# Patient Record
Sex: Male | Born: 1935
Health system: Southern US, Community
[De-identification: ages and names within clinical notes are randomized; demographics above are authoritative.]

## PROBLEM LIST (undated history)

## (undated) DIAGNOSIS — E785 Hyperlipidemia, unspecified: Secondary | ICD-10-CM

## (undated) DIAGNOSIS — E663 Overweight: Secondary | ICD-10-CM

## (undated) DIAGNOSIS — K760 Fatty (change of) liver, not elsewhere classified: Secondary | ICD-10-CM

## (undated) DIAGNOSIS — K3189 Other diseases of stomach and duodenum: Secondary | ICD-10-CM

## (undated) DIAGNOSIS — R339 Retention of urine, unspecified: Secondary | ICD-10-CM

## (undated) DIAGNOSIS — I519 Heart disease, unspecified: Secondary | ICD-10-CM

## (undated) DIAGNOSIS — J9601 Acute respiratory failure with hypoxia: Secondary | ICD-10-CM

## (undated) DIAGNOSIS — R972 Elevated prostate specific antigen [PSA]: Secondary | ICD-10-CM

## (undated) DIAGNOSIS — N289 Disorder of kidney and ureter, unspecified: Secondary | ICD-10-CM

## (undated) DIAGNOSIS — I10 Essential (primary) hypertension: Secondary | ICD-10-CM

## (undated) DIAGNOSIS — F0280 Dementia in other diseases classified elsewhere without behavioral disturbance: Secondary | ICD-10-CM

## (undated) DIAGNOSIS — N400 Enlarged prostate without lower urinary tract symptoms: Secondary | ICD-10-CM

## (undated) DIAGNOSIS — R310 Gross hematuria: Secondary | ICD-10-CM

## (undated) DIAGNOSIS — I739 Peripheral vascular disease, unspecified: Secondary | ICD-10-CM

## (undated) DIAGNOSIS — D126 Benign neoplasm of colon, unspecified: Secondary | ICD-10-CM

## (undated) DIAGNOSIS — E669 Obesity, unspecified: Secondary | ICD-10-CM

## (undated) HISTORY — DX: Hyperlipidemia, unspecified: E78.5

## (undated) HISTORY — DX: Heart disease, unspecified: I51.9

## (undated) HISTORY — PX: OTHER SURGICAL HISTORY: SHX169

## (undated) HISTORY — DX: Dementia in other diseases classified elsewhere, unspecified severity, without behavioral disturbance, psychotic disturbance, mood disturbance, and anxiety: F02.80

## (undated) HISTORY — DX: Retention of urine, unspecified: R33.9

## (undated) HISTORY — DX: Obesity, unspecified: E66.9

## (undated) HISTORY — DX: Peripheral vascular disease, unspecified: I73.9

## (undated) HISTORY — DX: Other diseases of stomach and duodenum: K31.89

## (undated) HISTORY — DX: Benign prostatic hyperplasia without lower urinary tract symptoms: N40.0

## (undated) HISTORY — DX: Gross hematuria: R31.0

## (undated) HISTORY — DX: Essential (primary) hypertension: I10

## (undated) HISTORY — DX: Elevated prostate specific antigen (PSA): R97.20

## (undated) HISTORY — DX: Overweight: E66.3

## (undated) HISTORY — DX: Fatty (change of) liver, not elsewhere classified: K76.0

## (undated) HISTORY — DX: Acute respiratory failure with hypoxia: J96.01

## (undated) HISTORY — PX: APPENDECTOMY: SHX54

## (undated) HISTORY — DX: Benign neoplasm of colon, unspecified: D12.6

---

## 1988-06-20 HISTORY — PX: OTHER SURGICAL HISTORY: SHX169

## 2006-10-15 ENCOUNTER — Emergency Department: Payer: Self-pay | Admitting: Internal Medicine

## 2006-10-24 ENCOUNTER — Emergency Department: Payer: Self-pay | Admitting: Internal Medicine

## 2007-01-18 ENCOUNTER — Encounter: Payer: Self-pay | Admitting: Internal Medicine

## 2007-07-26 ENCOUNTER — Ambulatory Visit: Payer: Self-pay | Admitting: Gastroenterology

## 2010-01-22 ENCOUNTER — Ambulatory Visit: Payer: Self-pay | Admitting: Internal Medicine

## 2010-03-05 ENCOUNTER — Ambulatory Visit: Payer: Self-pay | Admitting: Ophthalmology

## 2010-03-15 ENCOUNTER — Ambulatory Visit: Payer: Self-pay | Admitting: Ophthalmology

## 2010-03-18 ENCOUNTER — Ambulatory Visit: Payer: Self-pay | Admitting: Cardiology

## 2010-08-04 ENCOUNTER — Ambulatory Visit: Payer: Self-pay | Admitting: Ophthalmology

## 2010-08-17 ENCOUNTER — Ambulatory Visit: Payer: Self-pay | Admitting: Ophthalmology

## 2010-09-13 LAB — HM COLONOSCOPY

## 2011-01-31 ENCOUNTER — Encounter: Payer: Self-pay | Admitting: Internal Medicine

## 2011-02-28 ENCOUNTER — Other Ambulatory Visit: Payer: Self-pay | Admitting: Internal Medicine

## 2011-03-01 MED ORDER — NEBIVOLOL HCL 5 MG PO TABS: 5.0000 mg | ORAL_TABLET | Freq: Every day | ORAL | Status: DC

## 2011-05-03 ENCOUNTER — Ambulatory Visit: Payer: Self-pay | Admitting: Internal Medicine

## 2011-05-11 ENCOUNTER — Encounter: Payer: Self-pay | Admitting: Internal Medicine

## 2011-05-11 ENCOUNTER — Ambulatory Visit (INDEPENDENT_AMBULATORY_CARE_PROVIDER_SITE_OTHER): Payer: Medicare Other | Admitting: Internal Medicine

## 2011-05-11 VITALS — BP 138/62 | HR 53 | Temp 98.4°F | Resp 16 | Ht 68.0 in | Wt 174.2 lb

## 2011-05-11 DIAGNOSIS — E785 Hyperlipidemia, unspecified: Secondary | ICD-10-CM

## 2011-05-11 DIAGNOSIS — I10 Essential (primary) hypertension: Secondary | ICD-10-CM

## 2011-05-11 DIAGNOSIS — R42 Dizziness and giddiness: Secondary | ICD-10-CM

## 2011-05-11 DIAGNOSIS — Z1211 Encounter for screening for malignant neoplasm of colon: Secondary | ICD-10-CM

## 2011-05-11 DIAGNOSIS — D649 Anemia, unspecified: Secondary | ICD-10-CM

## 2011-05-11 DIAGNOSIS — E538 Deficiency of other specified B group vitamins: Secondary | ICD-10-CM

## 2011-05-11 NOTE — Patient Instructions (Signed)
You should get a TdaP (tetanus, diptheria, pertussis ) vaccine either here, at [facility] or at the [facility].  Medicare does not pay for it but supplemental  insurance does., . It is free at the [facility].

## 2011-05-12 LAB — COMPREHENSIVE METABOLIC PANEL
ALT: 15 U/L (ref 0–53)
AST: 22 U/L (ref 0–37)
Alkaline Phosphatase: 82 U/L (ref 39–117)
BUN: 20 mg/dL (ref 6–23)
Calcium: 9.3 mg/dL (ref 8.4–10.5)
Chloride: 102 mEq/L (ref 96–112)
Creat: 0.97 mg/dL (ref 0.50–1.35)
Total Bilirubin: 0.4 mg/dL (ref 0.3–1.2)

## 2011-05-12 LAB — FOLATE RBC

## 2011-05-13 ENCOUNTER — Encounter: Payer: Self-pay | Admitting: Internal Medicine

## 2011-05-13 DIAGNOSIS — N400 Enlarged prostate without lower urinary tract symptoms: Secondary | ICD-10-CM | POA: Insufficient documentation

## 2011-05-13 DIAGNOSIS — Z1211 Encounter for screening for malignant neoplasm of colon: Secondary | ICD-10-CM | POA: Insufficient documentation

## 2011-05-13 DIAGNOSIS — E785 Hyperlipidemia, unspecified: Secondary | ICD-10-CM | POA: Insufficient documentation

## 2011-05-13 DIAGNOSIS — E782 Mixed hyperlipidemia: Secondary | ICD-10-CM | POA: Insufficient documentation

## 2011-05-13 DIAGNOSIS — I10 Essential (primary) hypertension: Secondary | ICD-10-CM | POA: Insufficient documentation

## 2011-05-13 NOTE — Assessment & Plan Note (Addendum)
Last scope was March 2012 normal.  Prior scope was done in  2009, and one of 5 polyps was a tubular adenoma.

## 2011-05-13 NOTE — Assessment & Plan Note (Signed)
Reasonable control with bystolic and prn hctz.  History of cough with ACE Inhibitor.

## 2011-05-13 NOTE — Progress Notes (Signed)
Subjective:    Patient ID: Christopher Jenkins, male    DOB: 07-24-1926, 77 y.o.   MRN: [id number]  HPI  Mr. Tanzi is an 77 yr old white male with a history of hypertension, BPH who presents for 6 month follow up.  He feels generally well and is remaining physically active with golf and tennis several times weekly.  Chief complaint is daytime sleepiness requiring short naps.  No history of snoring or apneic behavior reported by wife.   Past Medical History  Diagnosis Date  . Hypertrophy (benign) of prostate     asymptomatic on finasteride. will f/up with urologist [name]  . Hyperlipidemia   . Hypertension     Current Outpatient Prescriptions on File Prior to Visit  Medication Sig Dispense Refill  . Cholecalciferol (VITAMIN D3 PO) Take 1 tablet by mouth daily.        Tery Sanfilippo Calcium (STOOL SOFTENER PO) Take 1 tablet by mouth daily as needed.        . finasteride (PROSCAR) 5 MG tablet Take 5 mg by mouth daily.        . fish oil-omega-3 fatty acids 1000 MG capsule Take 1 g by mouth daily.        . hydrochlorothiazide 25 MG tablet Take 25 mg by mouth daily.        . Multiple Vitamin (MULTIVITAMIN) tablet Take 1 tablet by mouth daily.        . nebivolol (BYSTOLIC) 5 MG tablet Take 1 tablet (5 mg total) by mouth daily.  90 tablet  3  . niacin 250 MG tablet Take 250 mg by mouth daily.         Review of Systems  Constitutional: Negative for fever, chills, diaphoresis, activity change, appetite change, fatigue and unexpected weight change.  HENT: Negative for hearing loss, ear pain, nosebleeds, congestion, sore throat, facial swelling, rhinorrhea, sneezing, drooling, mouth sores, trouble swallowing, neck pain, neck stiffness, dental problem, voice change, postnasal drip, sinus pressure, tinnitus and ear discharge.   Eyes: Negative for photophobia, pain, discharge, redness, itching and visual disturbance.  Respiratory: Negative for apnea, cough, choking, chest tightness, shortness of  breath, wheezing and stridor.   Cardiovascular: Negative for chest pain, palpitations and leg swelling.  Gastrointestinal: Negative for nausea, vomiting, abdominal pain, diarrhea, constipation, blood in stool, abdominal distention, anal bleeding and rectal pain.  Genitourinary: Negative for dysuria, urgency, frequency, hematuria, flank pain, decreased urine volume, scrotal swelling, difficulty urinating and testicular pain.  Musculoskeletal: Negative for myalgias, back pain, joint swelling, arthralgias and gait problem.  Skin: Negative for color change, rash and wound.  Neurological: Negative for dizziness, tremors, seizures, syncope, speech difficulty, weakness, light-headedness, numbness and headaches.  Psychiatric/Behavioral: Negative for suicidal ideas, hallucinations, behavioral problems, confusion, sleep disturbance, dysphoric mood, decreased concentration and agitation. The patient is not nervous/anxious.    BP 138/62  Pulse 53  Temp(Src) 98.4 F (36.9 C) (Oral)  Resp 16  Ht 5\' 8"  (1.727 m)  Wt 174 lb 4 oz (79.039 kg)  BMI 26.49 kg/m2  SpO2 98%     Objective:   Physical Exam  Constitutional: He is oriented to person, place, and time.  HENT:  Head: Normocephalic and atraumatic.  Mouth/Throat: Oropharynx is clear and moist.  Eyes: Conjunctivae and EOM are normal.  Neck: Normal range of motion. Neck supple. No JVD present. No thyromegaly present.  Cardiovascular: Normal rate, regular rhythm and normal heart sounds.   Pulmonary/Chest: Effort normal and breath sounds normal. He has  no wheezes. He has no rales.  Abdominal: Soft. Bowel sounds are normal. He exhibits no mass. There is no tenderness. There is no rebound.  Musculoskeletal: Normal range of motion. He exhibits no edema.  Neurological: He is alert and oriented to person, place, and time.  Skin: Skin is warm and dry.  Psychiatric: He has a normal mood and affect.       Assessment & Plan:

## 2011-05-13 NOTE — Assessment & Plan Note (Signed)
Managed with niacin and fish oil, due to myalgias from statin therapy. He has had a normal stress test since age 77 at my bidding.

## 2011-05-30 ENCOUNTER — Telehealth: Payer: Self-pay | Admitting: Internal Medicine

## 2011-05-30 NOTE — Telephone Encounter (Signed)
I do not prescribe diet pills over the phone because the side effects need t be discussed.  All are stimulants and can raise bp

## 2011-05-30 NOTE — Telephone Encounter (Signed)
Diet pill that he could take for weight loss either over the counter or prescription.

## 2011-07-08 DIAGNOSIS — M21619 Bunion of unspecified foot: Secondary | ICD-10-CM | POA: Diagnosis not present

## 2011-07-08 DIAGNOSIS — M779 Enthesopathy, unspecified: Secondary | ICD-10-CM | POA: Diagnosis not present

## 2011-07-08 DIAGNOSIS — L84 Corns and callosities: Secondary | ICD-10-CM | POA: Diagnosis not present

## 2011-07-19 ENCOUNTER — Other Ambulatory Visit: Payer: Self-pay | Admitting: *Deleted

## 2011-07-19 MED ORDER — HYDROCHLOROTHIAZIDE 25 MG PO TABS: 25.0000 mg | ORAL_TABLET | Freq: Every day | ORAL | Status: DC

## 2011-09-26 ENCOUNTER — Encounter: Payer: Self-pay | Admitting: Internal Medicine

## 2011-10-21 DIAGNOSIS — M21619 Bunion of unspecified foot: Secondary | ICD-10-CM | POA: Diagnosis not present

## 2011-11-08 ENCOUNTER — Encounter: Payer: 59 | Admitting: Internal Medicine

## 2011-12-14 ENCOUNTER — Ambulatory Visit (INDEPENDENT_AMBULATORY_CARE_PROVIDER_SITE_OTHER): Payer: Medicare Other | Admitting: Internal Medicine

## 2011-12-14 VITALS — BP 130/64 | HR 56 | Temp 98.0°F | Resp 16 | Ht 68.0 in | Wt 214.0 lb

## 2011-12-14 DIAGNOSIS — N400 Enlarged prostate without lower urinary tract symptoms: Secondary | ICD-10-CM | POA: Diagnosis not present

## 2011-12-14 DIAGNOSIS — Z0000 Encounter for general adult medical examination without abnormal findings: Secondary | ICD-10-CM | POA: Diagnosis not present

## 2011-12-14 DIAGNOSIS — R5383 Other fatigue: Secondary | ICD-10-CM

## 2011-12-14 DIAGNOSIS — I10 Essential (primary) hypertension: Secondary | ICD-10-CM

## 2011-12-14 DIAGNOSIS — E669 Obesity, unspecified: Secondary | ICD-10-CM | POA: Diagnosis not present

## 2011-12-14 DIAGNOSIS — E785 Hyperlipidemia, unspecified: Secondary | ICD-10-CM

## 2011-12-14 MED ORDER — DOCUSATE SODIUM 50 MG/5ML PO LIQD: ORAL | Status: DC

## 2011-12-14 NOTE — Progress Notes (Signed)
Patient ID: Christopher Jenkins, male   DOB: 04/04/1927, 78 y.o.   MRN: [id number] The patient is here for annual Medicare wellness examination and management of other chronic and acute problems. Went ski diving recently (tandem) and had an incredible time.  Concerned about recent legislative events going on in [place] and has been going every Monday to demonstrate..  Was arrested for civil disobedience/ tresspassing and spent a few hours in the slammer. NAACP lawyers handling everything including appearing in court in [place].     The risk factors are reflected in the social history.  The roster of all physicians providing medical care to patient - is listed in the Snapshot section of the chart.  Activities of daily living:  The patient is 100% independent in all ADLs: dressing, toileting, feeding as well as independent mobility  Home safety : The patient has smoke detectors in the home. They wear seatbelts.  There are no firearms at home. There is no violence in the home.   There is no risks for hepatitis, STDs or HIV. There is no   history of blood transfusion. They have no travel history to infectious disease endemic areas of the world.  The patient has seen their dentist in the last six month. They have seen their eye doctor in the last year. They admit to slight hearing difficulty with regard to whispered voices and some television programs.  They have deferred audiologic testing in the last year.  They do not  have excessive sun exposure. Discussed the need for sun protection: hats, long sleeves and use of sunscreen if there is significant sun exposure.   Diet: the importance of a healthy diet is discussed. They do have a healthy diet.  The benefits of regular aerobic exercise were discussed. She walks 4 times per week ,  20 minutes.   Depression screen: there are no signs or vegative symptoms of depression- irritability, change in appetite, anhedonia, sadness/tearfullness.  Cognitive  assessment: the patient manages all their financial and personal affairs and is actively engaged. They could relate day,date,year and events; recalled 2/3 objects at 3 minutes; performed clock-face test normally.  The following portions of the patient's history were reviewed and updated as appropriate: allergies, current medications, past family history, past medical history,  past surgical history, past social history  and problem list.  Visual acuity was not assessed per patient preference since she has regular follow up with her ophthalmologist. Hearing and body mass index were assessed and reviewed.   During the course of the visit the patient was educated and counseled about appropriate screening and preventive services including : fall prevention , diabetes screening, nutrition counseling, colorectal cancer screening, and recommended immunizations.     Obesity (BMI 30-39.9) I have addressed  BMI and recommended a low glycemic index diet utilizing smaller more frequent meals to increase metabolism.  I have also recommended that patient start exercising with a goal of 30 minutes of aerobic exercise a minimum of 5 days per week. Screening for lipid disorders, thyroid and diabetes has been done annually.    Hypertension Well controlled on current regimen. Renal function stable, no changes today.  Hypertrophy (benign) of prostate He is asymptomatic on finasteride.    Updated Medication List Outpatient Encounter Prescriptions as of 12/14/2011  Medication Sig Dispense Refill  . Cholecalciferol (VITAMIN D3 PO) Take 1 tablet by mouth daily.        Tery Sanfilippo Calcium (STOOL SOFTENER PO) Take 1 tablet by mouth daily as  needed.        . finasteride (PROSCAR) 5 MG tablet Take 5 mg by mouth daily.        . fish oil-omega-3 fatty acids 1000 MG capsule Take 1 g by mouth daily.        . hydrochlorothiazide (HYDRODIURIL) 25 MG tablet Take 1 tablet (25 mg total) by mouth daily.  90 tablet  1  .  Multiple Vitamin (MULTIVITAMIN) tablet Take 1 tablet by mouth daily.        . nebivolol (BYSTOLIC) 5 MG tablet Take 1 tablet (5 mg total) by mouth daily.  90 tablet  3  . niacin 250 MG tablet Take 250 mg by mouth daily.        Marland Kitchen docusate (COLACE) 50 MG/5ML liquid 3 drops in left ear before bedtime to soften earwax  30 mL  0

## 2011-12-16 ENCOUNTER — Other Ambulatory Visit (INDEPENDENT_AMBULATORY_CARE_PROVIDER_SITE_OTHER): Payer: 59 | Admitting: *Deleted

## 2011-12-16 DIAGNOSIS — R5383 Other fatigue: Secondary | ICD-10-CM | POA: Diagnosis not present

## 2011-12-16 DIAGNOSIS — E785 Hyperlipidemia, unspecified: Secondary | ICD-10-CM

## 2011-12-16 DIAGNOSIS — R5381 Other malaise: Secondary | ICD-10-CM | POA: Diagnosis not present

## 2011-12-16 LAB — TSH: TSH: 1.82 u[IU]/mL (ref 0.35–5.50)

## 2011-12-17 ENCOUNTER — Encounter: Payer: Self-pay | Admitting: Internal Medicine

## 2011-12-17 DIAGNOSIS — E66811 Obesity, class 1: Secondary | ICD-10-CM | POA: Insufficient documentation

## 2011-12-17 DIAGNOSIS — E669 Obesity, unspecified: Secondary | ICD-10-CM | POA: Insufficient documentation

## 2011-12-17 LAB — COMPLETE METABOLIC PANEL WITH GFR
Albumin: 3.8 g/dL (ref 3.5–5.2)
Alkaline Phosphatase: 73 U/L (ref 39–117)
CO2: 28 mEq/L (ref 19–32)
Calcium: 9.2 mg/dL (ref 8.4–10.5)
Chloride: 101 mEq/L (ref 96–112)
GFR, Est African American: 87 mL/min
GFR, Est Non African American: 75 mL/min
Glucose, Bld: 97 mg/dL (ref 70–99)
Potassium: 4.1 mEq/L (ref 3.5–5.3)
Sodium: 140 mEq/L (ref 135–145)
Total Protein: 6.1 g/dL (ref 6.0–8.3)

## 2011-12-17 NOTE — Assessment & Plan Note (Signed)
He is asymptomatic on finasteride.

## 2011-12-17 NOTE — Assessment & Plan Note (Signed)
I have addressed  BMI and recommended a low glycemic index diet utilizing smaller more frequent meals to increase metabolism.  I have also recommended that patient start exercising with a goal of 30 minutes of aerobic exercise a minimum of 5 days per week. Screening for lipid disorders, thyroid and diabetes has been done annually.

## 2011-12-17 NOTE — Assessment & Plan Note (Signed)
Well controlled on current regimen. Renal function stable, no changes today. 

## 2012-01-25 ENCOUNTER — Other Ambulatory Visit: Payer: Self-pay | Admitting: Internal Medicine

## 2012-01-25 MED ORDER — HYDROCHLOROTHIAZIDE 25 MG PO TABS: 25.0000 mg | ORAL_TABLET | Freq: Every day | ORAL | Status: DC

## 2012-02-06 ENCOUNTER — Telehealth: Payer: Self-pay | Admitting: Internal Medicine

## 2012-02-06 NOTE — Telephone Encounter (Signed)
Pt called 3 week ago he had blood in urine and this happened again this morning No pain no fever

## 2012-02-06 NOTE — Telephone Encounter (Signed)
Appt has been scheduled.

## 2012-02-08 ENCOUNTER — Encounter: Payer: Self-pay | Admitting: Internal Medicine

## 2012-02-08 ENCOUNTER — Ambulatory Visit (INDEPENDENT_AMBULATORY_CARE_PROVIDER_SITE_OTHER): Payer: Medicare Other | Admitting: Internal Medicine

## 2012-02-08 VITALS — BP 124/66 | HR 56 | Temp 98.0°F | Resp 16 | Wt 210.5 lb

## 2012-02-08 DIAGNOSIS — R310 Gross hematuria: Secondary | ICD-10-CM | POA: Insufficient documentation

## 2012-02-08 DIAGNOSIS — D126 Benign neoplasm of colon, unspecified: Secondary | ICD-10-CM | POA: Diagnosis not present

## 2012-02-08 DIAGNOSIS — N390 Urinary tract infection, site not specified: Secondary | ICD-10-CM

## 2012-02-08 LAB — POCT URINALYSIS DIPSTICK
Glucose, UA: NEGATIVE
Spec Grav, UA: 1.02
Urobilinogen, UA: 0.2
pH, UA: 6.5

## 2012-02-08 MED ORDER — CIPROFLOXACIN HCL 500 MG PO TABS: 500.0000 mg | ORAL_TABLET | Freq: Two times a day (BID) | ORAL | Status: AC

## 2012-02-08 NOTE — Progress Notes (Addendum)
Patient ID: Christopher Jenkins, male   DOB: 07-17-1926, 78 y.o.   MRN: [id number]  Patient Active Problem List  Diagnosis  . Hypertrophy (benign) of prostate  . Hyperlipidemia  . Hypertension  . Screening for colon cancer  . Obesity (BMI 30-39.9)  . Tubular adenoma of colon  . Hematuria, gross    Subjective:  CC:   Chief Complaint  Patient presents with  . Hematuria    HPI:   Christopher Jenkins a 78 y.o. male who presents Gross hematuria, painless  First episode 3 weeks ago,  Then again 2 days ago .   Takes a baby aspirin daily,  But no additional aspirin products or NSASIDs   No recent hheavy lifting,  No history of smoking.    Past Medical History  Diagnosis Date  . Hypertrophy (benign) of prostate     asymptomatic on finasteride. will f/up with urologist Christopher Jenkins  . Hyperlipidemia   . Hypertension     History reviewed. No pertinent past surgical history.       The following portions of the patient's history were reviewed and updated as appropriate: Allergies, current medications, and problem list.    Review of Systems:   12 Pt  review of systems was negative except those addressed in the HPI,     History   Social History  . Marital Status: Married    Spouse Name: N/A    Number of Children: N/A  . Years of Education: N/A   Occupational History  . Retired    Social History Main Topics  . Smoking status: Never Smoker   . Smokeless tobacco: Never Used  . Alcohol Use: Yes     Occasional  . Drug Use: No  . Sexually Active: Not on file   Other Topics Concern  . Not on file   Social History Narrative   Community sportsLives with spouse at [employer] regularly, 4 times a week 45-60 minutes, nautilus, racquet sportsRetiredDog as a petAlways uses seat belts.    Objective:  BP 124/66  Pulse 56  Temp 98 F (36.7 C) (Oral)  Resp 16  Wt 210 lb 8 oz (95.482 kg)  SpO2 96%  General appearance: alert, cooperative and appears  stated age Ears: normal TM's and external ear canals both ears Throat: lips, mucosa, and tongue normal; teeth and gums normal Neck: no adenopathy, no carotid bruit, supple, symmetrical, trachea midline and thyroid not enlarged, symmetric, no tenderness/mass/nodules Back: symmetric, no curvature. ROM normal. No CVA tenderness. Lungs: clear to auscultation bilaterally Heart: regular rate and rhythm, S1, S2 normal, no murmur, click, rub or gallop Abdomen: soft, non-tender; bowel sounds normal; no masses,  no organomegaly Pulses: 2+ and symmetric Skin: Skin color, texture, turgor normal. No rashes or lesions Lymph nodes: Cervical, supraclavicular, and axillary nodes normal. GU: prostate, testicular exam normal.  Assessment and Plan:  Hematuria, gross His first episode was 3 weeks ago, with one recurrence.  He had a recurrence two days ago.  UA was positive for blood and LE,.  Wil treat empirically with cipofloxacin for 2 weeks and schedule Urology  Evaluation with Dr. Leonette Jenkins.  Prostate and testicular exam were normal today.    Updated Medication List Outpatient Encounter Prescriptions as of 02/08/2012  Medication Sig Dispense Refill  . Cholecalciferol (VITAMIN D3 PO) Take 1 tablet by mouth daily.        Christopher Jenkins Calcium (STOOL SOFTENER PO) Take 1 tablet by mouth daily as needed.        Marland Kitchen  finasteride (PROSCAR) 5 MG tablet Take 5 mg by mouth daily.        . fish oil-omega-3 fatty acids 1000 MG capsule Take 1 g by mouth daily.        . hydrochlorothiazide (HYDRODIURIL) 25 MG tablet Take 1 tablet (25 mg total) by mouth daily.  90 tablet  3  . Multiple Vitamin (MULTIVITAMIN) tablet Take 1 tablet by mouth daily.        . nebivolol (BYSTOLIC) 5 MG tablet Take 1 tablet (5 mg total) by mouth daily.  90 tablet  3  . niacin 250 MG tablet Take 250 mg by mouth daily.        . ciprofloxacin (CIPRO) 500 MG tablet Take 1 tablet (500 mg total) by mouth 2 (two) times daily.  28 tablet  0  . DISCONTD:  docusate (COLACE) 50 MG/5ML liquid 3 drops in left ear before bedtime to soften earwax  30 mL  0     Orders Placed This Encounter  Procedures  . Urine Culture  . POCT Urinalysis Dipstick    No Follow-up on file.

## 2012-02-08 NOTE — Assessment & Plan Note (Signed)
His first episode was 3 weeks ago, with one recurrence.  He had a recurrence two days ago.  UA was positive for blood and LE,.  Wil treat rempirically with cirpofloxacin for 2 weeks and schedule Urology  With Dr. Orson Slick.  Prostate and testicular exam were normal today.

## 2012-02-12 LAB — URINE CULTURE: Colony Count: >=100000

## 2012-02-27 DIAGNOSIS — R310 Gross hematuria: Secondary | ICD-10-CM | POA: Diagnosis not present

## 2012-03-07 ENCOUNTER — Ambulatory Visit: Payer: Self-pay | Admitting: Urology

## 2012-03-07 DIAGNOSIS — R933 Abnormal findings on diagnostic imaging of other parts of digestive tract: Secondary | ICD-10-CM | POA: Diagnosis not present

## 2012-03-07 DIAGNOSIS — R319 Hematuria, unspecified: Secondary | ICD-10-CM | POA: Diagnosis not present

## 2012-03-07 DIAGNOSIS — N400 Enlarged prostate without lower urinary tract symptoms: Secondary | ICD-10-CM | POA: Diagnosis not present

## 2012-03-07 DIAGNOSIS — Q619 Cystic kidney disease, unspecified: Secondary | ICD-10-CM | POA: Diagnosis not present

## 2012-03-20 DIAGNOSIS — K319 Disease of stomach and duodenum, unspecified: Secondary | ICD-10-CM | POA: Diagnosis not present

## 2012-03-20 DIAGNOSIS — N400 Enlarged prostate without lower urinary tract symptoms: Secondary | ICD-10-CM | POA: Diagnosis not present

## 2012-03-20 DIAGNOSIS — R310 Gross hematuria: Secondary | ICD-10-CM | POA: Diagnosis not present

## 2012-03-26 DIAGNOSIS — H43819 Vitreous degeneration, unspecified eye: Secondary | ICD-10-CM | POA: Diagnosis not present

## 2012-03-27 ENCOUNTER — Telehealth: Payer: Self-pay | Admitting: Internal Medicine

## 2012-03-27 NOTE — Telephone Encounter (Signed)
Pt came in needs refill on  bystolic tabs 5mg  Take 1 tablet daily  90 tablets  [facility]  Mail order

## 2012-03-29 ENCOUNTER — Other Ambulatory Visit: Payer: Self-pay

## 2012-03-29 MED ORDER — NEBIVOLOL HCL 5 MG PO TABS: 5.0000 mg | ORAL_TABLET | Freq: Every day | ORAL | Status: DC

## 2012-03-29 NOTE — Telephone Encounter (Signed)
Per provider refill Bystolic 5 mg #90 0R. Patient has appointment 06/06/12.Rx sent to [facility] order.

## 2012-04-03 DIAGNOSIS — Z23 Encounter for immunization: Secondary | ICD-10-CM | POA: Diagnosis not present

## 2012-04-09 DIAGNOSIS — R972 Elevated prostate specific antigen [PSA]: Secondary | ICD-10-CM | POA: Diagnosis not present

## 2012-04-19 DIAGNOSIS — N400 Enlarged prostate without lower urinary tract symptoms: Secondary | ICD-10-CM | POA: Diagnosis not present

## 2012-04-19 DIAGNOSIS — R972 Elevated prostate specific antigen [PSA]: Secondary | ICD-10-CM | POA: Diagnosis not present

## 2012-04-19 DIAGNOSIS — N281 Cyst of kidney, acquired: Secondary | ICD-10-CM | POA: Diagnosis not present

## 2012-04-19 DIAGNOSIS — R310 Gross hematuria: Secondary | ICD-10-CM | POA: Diagnosis not present

## 2012-04-26 DIAGNOSIS — N400 Enlarged prostate without lower urinary tract symptoms: Secondary | ICD-10-CM | POA: Diagnosis not present

## 2012-04-26 DIAGNOSIS — R972 Elevated prostate specific antigen [PSA]: Secondary | ICD-10-CM | POA: Diagnosis not present

## 2012-04-26 DIAGNOSIS — N281 Cyst of kidney, acquired: Secondary | ICD-10-CM | POA: Diagnosis not present

## 2012-04-26 DIAGNOSIS — R310 Gross hematuria: Secondary | ICD-10-CM | POA: Diagnosis not present

## 2012-05-03 DIAGNOSIS — N400 Enlarged prostate without lower urinary tract symptoms: Secondary | ICD-10-CM | POA: Diagnosis not present

## 2012-05-03 DIAGNOSIS — R972 Elevated prostate specific antigen [PSA]: Secondary | ICD-10-CM | POA: Diagnosis not present

## 2012-05-03 DIAGNOSIS — N410 Acute prostatitis: Secondary | ICD-10-CM | POA: Diagnosis not present

## 2012-05-03 DIAGNOSIS — N411 Chronic prostatitis: Secondary | ICD-10-CM | POA: Diagnosis not present

## 2012-05-25 ENCOUNTER — Telehealth: Payer: Self-pay | Admitting: Internal Medicine

## 2012-05-25 DIAGNOSIS — G8929 Other chronic pain: Secondary | ICD-10-CM

## 2012-05-25 NOTE — Telephone Encounter (Signed)
On printer

## 2012-05-25 NOTE — Telephone Encounter (Signed)
Caller: [place]/Patient; Phone: [phone]; Reason for Call: Patient calling about left shoulder "rotator cuff problem".  States was told to see physical therapy staff at [facility] but needs referral/Rx from Dr.  Darrick Huntsman for PT.  States may fax Rx to [facility], [phone].  Info to office for provider review/Rx/referral/callback.   May reach patient at [phone].

## 2012-05-31 DIAGNOSIS — M6281 Muscle weakness (generalized): Secondary | ICD-10-CM | POA: Diagnosis not present

## 2012-05-31 DIAGNOSIS — M2550 Pain in unspecified joint: Secondary | ICD-10-CM | POA: Diagnosis not present

## 2012-06-04 DIAGNOSIS — M6281 Muscle weakness (generalized): Secondary | ICD-10-CM | POA: Diagnosis not present

## 2012-06-04 DIAGNOSIS — M2550 Pain in unspecified joint: Secondary | ICD-10-CM | POA: Diagnosis not present

## 2012-06-06 ENCOUNTER — Ambulatory Visit (INDEPENDENT_AMBULATORY_CARE_PROVIDER_SITE_OTHER): Payer: Medicare Other | Admitting: Internal Medicine

## 2012-06-06 ENCOUNTER — Encounter: Payer: Self-pay | Admitting: Internal Medicine

## 2012-06-06 VITALS — BP 110/60 | HR 56 | Temp 98.4°F | Resp 12 | Ht 68.0 in | Wt 214.8 lb

## 2012-06-06 DIAGNOSIS — R310 Gross hematuria: Secondary | ICD-10-CM

## 2012-06-06 DIAGNOSIS — I10 Essential (primary) hypertension: Secondary | ICD-10-CM

## 2012-06-06 DIAGNOSIS — Z1331 Encounter for screening for depression: Secondary | ICD-10-CM | POA: Diagnosis not present

## 2012-06-06 DIAGNOSIS — R413 Other amnesia: Secondary | ICD-10-CM

## 2012-06-06 DIAGNOSIS — Z79899 Other long term (current) drug therapy: Secondary | ICD-10-CM | POA: Diagnosis not present

## 2012-06-06 DIAGNOSIS — N400 Enlarged prostate without lower urinary tract symptoms: Secondary | ICD-10-CM

## 2012-06-06 DIAGNOSIS — Z7189 Other specified counseling: Secondary | ICD-10-CM | POA: Insufficient documentation

## 2012-06-06 NOTE — Progress Notes (Signed)
Patient ID: Christopher Jenkins, male   DOB: [birth year], 78 y.o.   MRN: [id number]  Patient Active Problem List  Diagnosis  . Hypertrophy (benign) of prostate  . Hyperlipidemia  . Hypertension  . Screening for colon cancer  . Obesity (BMI 30-39.9)  . Tubular adenoma of colon  . Hematuria, gross  . Do not resuscitate discussion  . Memory loss, short term    Subjective:  CC:   Chief Complaint  Patient presents with  . Follow-up    HPI:   Christopher Jenkins a 78 y.o. male who presents  6 month follow up on chronic conditions.  His left pain has been attributed to  bursitis and rotator cuff syndrome by PT evaluation and he is receiving PT twice a week .  Pain has improved.  No episodes of frozen shoulder since Thanksgiving. Right knee pain as well,  PT diagnosed sciatica after review of old records and has been having him do stretches . 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  Has had steroid injections in the past which have worked well , 6 over the last 2 or 3 years.4)  Had a prostate biopsy by Dr. Leonette Jenkins in September for BPD wit elevated PSA after having enterococcal UTI .  No current symptoms. No recurrent hematuria   Past Medical History  Diagnosis Date  . Hypertrophy (benign) of prostate     asymptomatic on finasteride. will f/up with urologist Christopher Jenkins  . Hyperlipidemia   . Hypertension     History reviewed. No pertinent past surgical history.       The following portions of the patient's history were reviewed and updated as appropriate: Allergies, current medications, and problem list.    Review of Systems:  Patient denies headache, fevers, malaise, unintentional weight loss, skin rash, eye pain, sinus congestion and sinus pain, sore throat, dysphagia,  hemoptysis , cough, dyspnea, wheezing, chest pain, palpitations, orthopnea, edema, abdominal pain, nausea, melena,  diarrhea, constipation, flank pain, dysuria, hematuria, urinary  Frequency, nocturia, numbness, tingling, seizures,  Focal weakness, Loss of consciousness,  Tremor, insomnia, depression, anxiety, and suicidal ideation.         History   Social History  . Marital Status: Married    Spouse Name: N/A    Number of Children: N/A  . Years of Education: N/A   Occupational History  . Retired    Social History Main Topics  . Smoking status: Never Smoker   . Smokeless tobacco: Never Used  . Alcohol Use: Yes     Comment: Occasional  . Drug Use: No  . Sexually Active: Not on file   Other Topics Concern  . Not on file   Social History Narrative   Community sportsLives with spouse at [employer] regularly, 4 times a week 45-60 minutes, nautilus, racquet sportsRetiredDog as a petAlways uses seat belts.    Objective:  BP 110/60  Pulse 56  Temp 98.4 F (36.9 C) (Oral)  Resp 12  Ht 5\' 8"  (1.727 m)  Wt 214 lb 12 oz (97.41 kg)  BMI 32.65 kg/m2  SpO2 95%  General appearance: alert, cooperative and appears stated age Ears: normal TM's and external ear canals both ears Throat: lips, mucosa, and tongue normal; teeth and gums normal Neck: no adenopathy, no carotid bruit, supple, symmetrical, trachea midline and thyroid not enlarged, symmetric, no tenderness/mass/nodules Back: symmetric, no curvature. ROM normal. No CVA tenderness.  Lungs: clear to auscultation bilaterally Heart: regular rate and rhythm, S1, S2 normal, no murmur, click, rub or gallop Abdomen: soft, non-tender; bowel sounds normal; no masses,  no organomegaly Pulses: 2+ and symmetric Skin: Skin color, texture, turgor normal. No rashes or lesions Lymph nodes: Cervical, supraclavicular, and axillary nodes normal.  Assessment and Plan:  Do not resuscitate discussion Patient has requested DNR order be placed in his file after a discussion of end of life wishes.  Hematuria, gross Accompanied by UTI and BPH.  Urology evaluation done including normal prostate biopsy.  With records requested.   Hypertension Well controlled on current medications.  No changes today.  Memory loss, short term He had difficulty recalling recent medical events and details of procedures done recently.  He has been screened for reversible causes.   Hypertrophy (benign) of prostate Managed with Proscar , prescribed by Dr . Christopher Jenkins,.  Results of recent biopsy have been requested.    Updated Medication List Outpatient Encounter Prescriptions as of 06/06/2012  Medication Sig Dispense Refill  . Cholecalciferol (VITAMIN D3 PO) Take 1 tablet by mouth daily.        Tery Sanfilippo Calcium (STOOL SOFTENER PO) Take 1 tablet by mouth daily as needed.        . finasteride (PROSCAR) 5 MG tablet Take 5 mg by mouth daily.        . fish oil-omega-3 fatty acids 1000 MG capsule Take 1 g by mouth daily.        . hydrochlorothiazide (HYDRODIURIL) 25 MG tablet Take 1 tablet (25 mg total) by mouth daily.  90 tablet  3  . Multiple Vitamin (MULTIVITAMIN) tablet Take 1 tablet by mouth daily.        . nebivolol (BYSTOLIC) 5 MG tablet Take 1 tablet (5 mg total) by mouth daily.  90 tablet  0  . niacin 250 MG tablet Take 250 mg by mouth daily.           Orders Placed This Encounter  Procedures  . Comprehensive metabolic panel    Return in about 6 months (around 12/05/2012).

## 2012-06-06 NOTE — Patient Instructions (Addendum)
I have made copies of your advanced directive and DNR forms for your chart.   Please sign a medical release form for Dr. Quintella Reichert' office notes

## 2012-06-07 DIAGNOSIS — M2550 Pain in unspecified joint: Secondary | ICD-10-CM | POA: Diagnosis not present

## 2012-06-07 DIAGNOSIS — M6281 Muscle weakness (generalized): Secondary | ICD-10-CM | POA: Diagnosis not present

## 2012-06-07 LAB — COMPREHENSIVE METABOLIC PANEL
AST: 26 U/L (ref 0–37)
Albumin: 3.7 g/dL (ref 3.5–5.2)
Alkaline Phosphatase: 84 U/L (ref 39–117)
BUN: 27 mg/dL — ABNORMAL HIGH (ref 6–23)
Calcium: 9.2 mg/dL (ref 8.4–10.5)
Chloride: 101 mEq/L (ref 96–112)
Potassium: 3.7 mEq/L (ref 3.5–5.1)
Sodium: 141 mEq/L (ref 135–145)
Total Protein: 6.9 g/dL (ref 6.0–8.3)

## 2012-06-08 DIAGNOSIS — L84 Corns and callosities: Secondary | ICD-10-CM | POA: Diagnosis not present

## 2012-06-08 DIAGNOSIS — M779 Enthesopathy, unspecified: Secondary | ICD-10-CM | POA: Diagnosis not present

## 2012-06-10 ENCOUNTER — Encounter: Payer: Self-pay | Admitting: Internal Medicine

## 2012-06-10 DIAGNOSIS — F02B0 Dementia in other diseases classified elsewhere, moderate, without behavioral disturbance, psychotic disturbance, mood disturbance, and anxiety: Secondary | ICD-10-CM | POA: Insufficient documentation

## 2012-06-10 DIAGNOSIS — F0280 Dementia in other diseases classified elsewhere without behavioral disturbance: Secondary | ICD-10-CM | POA: Insufficient documentation

## 2012-06-10 NOTE — Assessment & Plan Note (Signed)
Accompanied by UTI and BPH. Urology evaluation done including normal prostate biopsy.  With records requested.

## 2012-06-10 NOTE — Assessment & Plan Note (Addendum)
He had difficulty recalling recent medical events and details of procedures done recently.  He has been screened for reversible causes.

## 2012-06-10 NOTE — Assessment & Plan Note (Signed)
Patient has requested DNR order be placed in his file after a discussion of end of life wishes.

## 2012-06-10 NOTE — Assessment & Plan Note (Signed)
Managed with Proscar , prescribed by Dr . Leonette Monarch,.  Results of recent biopsy have been requested.

## 2012-06-10 NOTE — Assessment & Plan Note (Signed)
Well controlled on current medications.  No changes today. 

## 2012-06-11 ENCOUNTER — Ambulatory Visit: Payer: 59 | Admitting: Internal Medicine

## 2012-06-25 ENCOUNTER — Other Ambulatory Visit: Payer: Self-pay | Admitting: General Practice

## 2012-06-25 MED ORDER — NEBIVOLOL HCL 5 MG PO TABS: 5.0000 mg | ORAL_TABLET | Freq: Every day | ORAL | Status: DC

## 2012-06-27 DIAGNOSIS — M2550 Pain in unspecified joint: Secondary | ICD-10-CM | POA: Diagnosis not present

## 2012-06-27 DIAGNOSIS — M6281 Muscle weakness (generalized): Secondary | ICD-10-CM | POA: Diagnosis not present

## 2012-06-29 DIAGNOSIS — M6281 Muscle weakness (generalized): Secondary | ICD-10-CM | POA: Diagnosis not present

## 2012-06-29 DIAGNOSIS — M2550 Pain in unspecified joint: Secondary | ICD-10-CM | POA: Diagnosis not present

## 2012-07-03 ENCOUNTER — Telehealth: Payer: Self-pay | Admitting: General Practice

## 2012-07-03 DIAGNOSIS — M2550 Pain in unspecified joint: Secondary | ICD-10-CM | POA: Diagnosis not present

## 2012-07-03 DIAGNOSIS — N400 Enlarged prostate without lower urinary tract symptoms: Secondary | ICD-10-CM

## 2012-07-03 DIAGNOSIS — M6281 Muscle weakness (generalized): Secondary | ICD-10-CM | POA: Diagnosis not present

## 2012-07-03 NOTE — Telephone Encounter (Signed)
No, if they have not contacted him yet with results please call and request the results.

## 2012-07-03 NOTE — Telephone Encounter (Signed)
Pt called wanting to know if we have received any results on his biopsy that was completed at [facility]. Please advise.

## 2012-07-04 NOTE — Telephone Encounter (Signed)
The prior message is referring to the biopsy done in November. If he has a subsequent biopsy of his prostate  since then I do not have that.

## 2012-07-04 NOTE — Telephone Encounter (Signed)
I believe these came in today.

## 2012-07-04 NOTE — Telephone Encounter (Signed)
His biopsies of the prostate were reportedly negative , but the prostate was guite large. So  Dr Orson Slick is going to repeat the PSA in 3 months and consider repeating the biopsies depending on the pSA

## 2012-07-06 ENCOUNTER — Ambulatory Visit (INDEPENDENT_AMBULATORY_CARE_PROVIDER_SITE_OTHER): Payer: Medicare Other | Admitting: Internal Medicine

## 2012-07-06 ENCOUNTER — Encounter: Payer: Self-pay | Admitting: Internal Medicine

## 2012-07-06 VITALS — BP 122/64 | HR 51 | Temp 97.7°F | Resp 16 | Wt 216.5 lb

## 2012-07-06 DIAGNOSIS — N400 Enlarged prostate without lower urinary tract symptoms: Secondary | ICD-10-CM | POA: Diagnosis not present

## 2012-07-06 DIAGNOSIS — R310 Gross hematuria: Secondary | ICD-10-CM | POA: Diagnosis not present

## 2012-07-06 DIAGNOSIS — G4710 Hypersomnia, unspecified: Secondary | ICD-10-CM

## 2012-07-06 MED ORDER — NEBIVOLOL HCL 2.5 MG PO TABS: 2.5000 mg | ORAL_TABLET | Freq: Every day | ORAL | Status: DC

## 2012-07-06 MED ORDER — FINASTERIDE 5 MG PO TABS: 5.0000 mg | ORAL_TABLET | Freq: Every day | ORAL | Status: DC

## 2012-07-06 NOTE — Assessment & Plan Note (Addendum)
resume proscar.  Repeat prostate biopsy planned by Dr. Leonette Monarch in the Spring.

## 2012-07-06 NOTE — Patient Instructions (Addendum)
Your sleepiness is currently occurring with passive activities (listening, watching TV) and not pathologic   Your exercise plans are right on track   Your prostate biopsy was normal.  Your prostate is huge so you may need a repeat biopsy and you need to resume the Proscar to shrink it.   I am reducing your Bysoltic dose to 2.5 mg   Because  your blood pressure and  pulse are low enough to do this   We will do fasting labs including cholesterol in June   Continue your supplements , you can stop the multivitamin

## 2012-07-06 NOTE — Progress Notes (Signed)
Patient ID: Christopher Jenkins, male   DOB: 05/04/1927, 79 y.o.   MRN: [id number]  Patient Active Problem List  Diagnosis  . Hypertrophy (benign) of prostate  . Hyperlipidemia  . Hypertension  . Screening for colon cancer  . Obesity (BMI 30-39.9)  . Tubular adenoma of colon  . Hematuria, gross  . Do not resuscitate discussion  . Memory loss, short term  . Hypersomnolence    Subjective:  CC:   Chief Complaint  Patient presents with  . Fatigue    HPI:   Christopher Jenkins a 79 y.o. male who presents for follow up on chronic medical issues including shoulder pain, back pain and hypersomnolence.  He is receiving PT for right leg pain from sciatica which has dramatically improved his pain.  He is also seeing PT for treatment of left shoulder pain attributed to bursitis PT and heat/cold therapy is helping a lot.  His new issue is Increased somnolence during the day , dewspite sleeping 8 hours of sleep.  He does snores occasionally but his wife has not noticed any apneic spells, and he does not have excessive nocturnal activity .  He is falling asleep during passive activities only.    Past Medical History  Diagnosis Date  . Hypertrophy (benign) of prostate     asymptomatic on finasteride. will f/up with urologist Christopher Jenkins  . Hyperlipidemia   . Hypertension     No past surgical history on file.       The following portions of the patient's history were reviewed and updated as appropriate: Allergies, current medications, and problem list.    Review of Systems:   12 Pt  review of systems was negative except those addressed in the HPI,     History   Social History  . Marital Status: Married    Spouse Name: N/A    Number of Children: N/A  . Years of Education: N/A   Occupational History  . Retired    Social History Main Topics  . Smoking status: Never Smoker   . Smokeless tobacco: Never Used  . Alcohol Use: Yes     Comment: Occasional  . Drug  Use: No  . Sexually Active: Not on file   Other Topics Concern  . Not on file   Social History Narrative   Community sportsLives with spouse at [employer] regularly, 4 times a week 45-60 minutes, nautilus, racquet sportsRetiredDog as a petAlways uses seat belts.    Objective:  BP 122/64  Pulse 51  Temp 97.7 F (36.5 C) (Oral)  Resp 16  Wt 216 lb 8 oz (98.204 kg)  SpO2 97%  General appearance: alert, cooperative and appears stated age Ears: normal TM's and external ear canals both ears Throat: lips, mucosa, and tongue normal; teeth and gums normal Neck: no adenopathy, no carotid bruit, supple, symmetrical, trachea midline and thyroid not enlarged, symmetric, no tenderness/mass/nodules Back: symmetric, no curvature. ROM normal. No CVA tenderness. Lungs: clear to auscultation bilaterally Heart: regular rate and rhythm, S1, S2 normal, no murmur, click, rub or gallop Abdomen: soft, non-tender; bowel sounds normal; no masses,  no organomegaly Pulses: 2+ and symmetric Skin: Skin color, texture, turgor normal. No rashes or lesions Lymph nodes: Cervical, supraclavicular, and axillary nodes normal.  Assessment and Plan:  Hypertrophy (benign) of prostate resume proscar.  Repeat prostate biopsy planned by Christopher. Leonette Jenkins in the Spring.   Hematuria, gross He has regular follow up with Christopher Jenkins and has undergone prostate biopsy due to  elevated PSA with recent large increase in level to > 60.   Hypersomnolence Reassurance provided that currenlty his symptoms are not pathologic but brought on by passive acitivities   Updated Medication List Outpatient Encounter Prescriptions as of 07/06/2012  Medication Sig Dispense Refill  . Cholecalciferol (VITAMIN D3 PO) Take 1 tablet by mouth daily.        Tery Sanfilippo Calcium (STOOL SOFTENER PO) Take 1 tablet by mouth daily as needed.        . finasteride (PROSCAR) 5 MG tablet Take 1 tablet (5 mg total) by mouth daily.  90 tablet  3  . fish  oil-omega-3 fatty acids 1000 MG capsule Take 1 g by mouth daily.        . hydrochlorothiazide (HYDRODIURIL) 25 MG tablet Take 1 tablet (25 mg total) by mouth daily.  90 tablet  3  . Multiple Vitamin (MULTIVITAMIN) tablet Take 1 tablet by mouth daily.        . nebivolol (BYSTOLIC) 2.5 MG tablet Take 1 tablet (2.5 mg total) by mouth daily.  90 tablet  0  . niacin 250 MG tablet Take 250 mg by mouth daily.        . [DISCONTINUED] finasteride (PROSCAR) 5 MG tablet Take 5 mg by mouth daily.       . [DISCONTINUED] nebivolol (BYSTOLIC) 5 MG tablet Take 1 tablet (5 mg total) by mouth daily.  90 tablet  0     No orders of the defined types were placed in this encounter.    No Follow-up on file.

## 2012-07-06 NOTE — Telephone Encounter (Signed)
Pt spoke with Dr. Darrick Huntsman at appt today.

## 2012-07-08 ENCOUNTER — Encounter: Payer: Self-pay | Admitting: Internal Medicine

## 2012-07-08 DIAGNOSIS — G4710 Hypersomnia, unspecified: Secondary | ICD-10-CM | POA: Insufficient documentation

## 2012-07-08 NOTE — Assessment & Plan Note (Signed)
Reassurance provided that currenlty his symptoms are not pathologic but brought on by passive acitivities

## 2012-07-08 NOTE — Assessment & Plan Note (Signed)
He has regular follow up with Dr Leonette Monarch and has undergone prostate biopsy due to elevated PSA with recent large increase in level to > 60.

## 2012-07-24 ENCOUNTER — Other Ambulatory Visit (INDEPENDENT_AMBULATORY_CARE_PROVIDER_SITE_OTHER): Payer: Medicare Other

## 2012-07-24 ENCOUNTER — Telehealth: Payer: Self-pay | Admitting: Internal Medicine

## 2012-07-24 DIAGNOSIS — R350 Frequency of micturition: Secondary | ICD-10-CM

## 2012-07-24 LAB — URINALYSIS, ROUTINE W REFLEX MICROSCOPIC
Bilirubin Urine: NEGATIVE
Ketones, ur: NEGATIVE
Urine Glucose: NEGATIVE
Urobilinogen, UA: 0.2 (ref 0.0–1.0)

## 2012-07-24 MED ORDER — TOLTERODINE TARTRATE 2 MG PO TABS: 2.0000 mg | ORAL_TABLET | Freq: Two times a day (BID) | ORAL | Status: DC

## 2012-07-24 NOTE — Telephone Encounter (Signed)
Pt notified will be in the afternoon.

## 2012-07-24 NOTE — Telephone Encounter (Signed)
Pt will be in after lunch to give a urine specimen per Dr. Darrick Huntsman. Orders were placed.

## 2012-07-24 NOTE — Telephone Encounter (Signed)
Pt called he is going to the bathroom more frequently (every hour)  He is going out of town this weekend and wanted to know if there was a pill he could take to help with this. [facility]

## 2012-07-24 NOTE — Telephone Encounter (Signed)
Yes, but we need to make sure he doesn't have an infection before I prescribe anything..  You can have him come by and drop off a urine specimen for UA and culture.

## 2012-07-24 NOTE — Addendum Note (Signed)
Addended by: Sherlene Shams on: 07/24/2012 04:57 PM   Modules accepted: Orders

## 2012-07-28 LAB — URINE CULTURE: Colony Count: 30000

## 2012-07-30 DIAGNOSIS — R339 Retention of urine, unspecified: Secondary | ICD-10-CM | POA: Diagnosis not present

## 2012-07-31 ENCOUNTER — Telehealth: Payer: Self-pay | Admitting: Internal Medicine

## 2012-07-31 NOTE — Telephone Encounter (Signed)
Patient requested call from MD.  Would not discuss matter with CMA .  Patient had urology followup.  Detrol was stopped,  Foley catheter was placed.  He woke up to a urine soaked bed this morning.  Explained that catheter may be leaking bc the balloon was not expanded adequately.  Instructed to call his urologist if it happens again

## 2012-08-15 DIAGNOSIS — R339 Retention of urine, unspecified: Secondary | ICD-10-CM | POA: Diagnosis not present

## 2012-08-15 DIAGNOSIS — N400 Enlarged prostate without lower urinary tract symptoms: Secondary | ICD-10-CM | POA: Diagnosis not present

## 2012-08-28 DIAGNOSIS — R972 Elevated prostate specific antigen [PSA]: Secondary | ICD-10-CM | POA: Diagnosis not present

## 2012-08-29 ENCOUNTER — Telehealth: Payer: Self-pay | Admitting: Emergency Medicine

## 2012-08-29 DIAGNOSIS — H6123 Impacted cerumen, bilateral: Secondary | ICD-10-CM

## 2012-08-29 NOTE — Telephone Encounter (Signed)
Done

## 2012-08-29 NOTE — Telephone Encounter (Signed)
Patient called and stated Dr. Darrick Huntsman thought maybe he should go over to an ENT to get the wax taken out of his ears. He is stating he is ready for this referral. Can we place this? Please advise.

## 2012-09-03 DIAGNOSIS — H6120 Impacted cerumen, unspecified ear: Secondary | ICD-10-CM | POA: Diagnosis not present

## 2012-09-03 DIAGNOSIS — H905 Unspecified sensorineural hearing loss: Secondary | ICD-10-CM | POA: Diagnosis not present

## 2012-09-12 DIAGNOSIS — N400 Enlarged prostate without lower urinary tract symptoms: Secondary | ICD-10-CM | POA: Diagnosis not present

## 2012-09-12 DIAGNOSIS — N411 Chronic prostatitis: Secondary | ICD-10-CM | POA: Diagnosis not present

## 2012-09-12 DIAGNOSIS — N410 Acute prostatitis: Secondary | ICD-10-CM | POA: Diagnosis not present

## 2012-09-12 DIAGNOSIS — R972 Elevated prostate specific antigen [PSA]: Secondary | ICD-10-CM | POA: Diagnosis not present

## 2012-09-24 DIAGNOSIS — D3130 Benign neoplasm of unspecified choroid: Secondary | ICD-10-CM | POA: Diagnosis not present

## 2012-10-17 ENCOUNTER — Telehealth: Payer: Self-pay | Admitting: *Deleted

## 2012-10-17 DIAGNOSIS — E785 Hyperlipidemia, unspecified: Secondary | ICD-10-CM

## 2012-10-17 DIAGNOSIS — R5381 Other malaise: Secondary | ICD-10-CM

## 2012-10-17 DIAGNOSIS — E559 Vitamin D deficiency, unspecified: Secondary | ICD-10-CM

## 2012-10-17 NOTE — Telephone Encounter (Signed)
5000 units daily is too much Vitamin D to take daily.  Tell him to stop it .  I will check his vitamin d level next time he is due for fasting  labs,  Which he is due for in June

## 2012-10-17 NOTE — Telephone Encounter (Addendum)
Patient called stating that he is taking D-3 5000 units daily and Niacin. Patient states that he does not know why or when he started taking this, but wants to know if he should continue it?  Patient states that he is not having any problems with the medication.

## 2012-10-18 NOTE — Telephone Encounter (Signed)
Notified about Vitamin D patient would like to make sure the niacin is okay.

## 2012-10-18 NOTE — Telephone Encounter (Signed)
Yes he can continue the niacin

## 2012-10-19 NOTE — Telephone Encounter (Signed)
Patient notified

## 2012-10-26 ENCOUNTER — Telehealth: Payer: Self-pay | Admitting: *Deleted

## 2012-10-26 MED ORDER — HYDROCHLOROTHIAZIDE 25 MG PO TABS: 25.0000 mg | ORAL_TABLET | Freq: Every day | ORAL | Status: DC

## 2012-10-26 NOTE — Telephone Encounter (Signed)
Needs medication refill on HCTZ.

## 2012-10-26 NOTE — Telephone Encounter (Signed)
Prescription sent electronically

## 2012-11-01 ENCOUNTER — Telehealth: Payer: Self-pay | Admitting: *Deleted

## 2012-11-01 NOTE — Telephone Encounter (Signed)
Patient would like to know if he is to continue tamsulosin or should it be D\C , tried to call patient and find if urologist prescribed, left message with family member for patient to return call.

## 2012-11-01 NOTE — Telephone Encounter (Signed)
Called patient found medication prescribed by urology informed patient he should contact his urologist before stopping.

## 2012-12-05 ENCOUNTER — Ambulatory Visit (INDEPENDENT_AMBULATORY_CARE_PROVIDER_SITE_OTHER): Payer: Medicare Other | Admitting: Adult Health

## 2012-12-05 ENCOUNTER — Encounter: Payer: Self-pay | Admitting: Adult Health

## 2012-12-05 VITALS — BP 110/58 | HR 57 | Resp 12 | Wt 213.5 lb

## 2012-12-05 DIAGNOSIS — E559 Vitamin D deficiency, unspecified: Secondary | ICD-10-CM | POA: Diagnosis not present

## 2012-12-05 DIAGNOSIS — E785 Hyperlipidemia, unspecified: Secondary | ICD-10-CM | POA: Diagnosis not present

## 2012-12-05 DIAGNOSIS — I10 Essential (primary) hypertension: Secondary | ICD-10-CM

## 2012-12-05 DIAGNOSIS — G4710 Hypersomnia, unspecified: Secondary | ICD-10-CM | POA: Diagnosis not present

## 2012-12-05 DIAGNOSIS — R400 Somnolence: Secondary | ICD-10-CM

## 2012-12-05 DIAGNOSIS — R5381 Other malaise: Secondary | ICD-10-CM

## 2012-12-05 DIAGNOSIS — E871 Hypo-osmolality and hyponatremia: Secondary | ICD-10-CM

## 2012-12-05 DIAGNOSIS — R5383 Other fatigue: Secondary | ICD-10-CM | POA: Diagnosis not present

## 2012-12-05 LAB — CBC WITH DIFFERENTIAL/PLATELET
Basophils Absolute: 0.1 10*3/uL (ref 0.0–0.1)
Eosinophils Absolute: 0.1 10*3/uL (ref 0.0–0.7)
Hemoglobin: 13.3 g/dL (ref 13.0–17.0)
Lymphocytes Relative: 34.6 % (ref 12.0–46.0)
MCHC: 33.5 g/dL (ref 30.0–36.0)
Monocytes Absolute: 0.7 10*3/uL (ref 0.1–1.0)
Neutro Abs: 3.8 10*3/uL (ref 1.4–7.7)
RDW: 13.4 % (ref 11.5–14.6)

## 2012-12-05 LAB — COMPREHENSIVE METABOLIC PANEL
ALT: 20 U/L (ref 0–53)
AST: 25 U/L (ref 0–37)
Albumin: 3.6 g/dL (ref 3.5–5.2)
Calcium: 8.6 mg/dL (ref 8.4–10.5)
Chloride: 94 mEq/L — ABNORMAL LOW (ref 96–112)
Potassium: 3.4 mEq/L — ABNORMAL LOW (ref 3.5–5.1)

## 2012-12-05 LAB — URINALYSIS, ROUTINE W REFLEX MICROSCOPIC
Hgb urine dipstick: NEGATIVE
Nitrite: NEGATIVE
Urobilinogen, UA: 0.2 (ref 0.0–1.0)

## 2012-12-05 LAB — LIPID PANEL
Total CHOL/HDL Ratio: 5
VLDL: 60.2 mg/dL — ABNORMAL HIGH (ref 0.0–40.0)

## 2012-12-05 MED ORDER — NEBIVOLOL HCL 2.5 MG PO TABS: 2.5000 mg | ORAL_TABLET | Freq: Every day | ORAL | Status: DC

## 2012-12-05 NOTE — Assessment & Plan Note (Addendum)
Persistent somnolence. Medication profile shows that he was prescribed bystolic 2.5 mg daily; however, he reports he is taking 5 mg daily. During visit today he is bradycardic. Blood pressure 110/58. Uncertain if the increase in dose may be producing some of his symptoms. The patient is unable to tell me how long he has been taking 5mg . Decrease dose of Bystolic back to 2.5 mg daily. Continue to monitor blood pressure and pulse. Check labs: CBC, metabolic panel, urinalysis. Return for f/u in 1-2 weeks.

## 2012-12-05 NOTE — Progress Notes (Signed)
Subjective:    Patient ID: Christopher Jenkins, male    DOB: [birth year], 79 y.o.   MRN: [id number]  HPI  Patient is a pleasant 79 year old male with history of hypertension, hyperlipidemia, hypersomnolence who presents to clinic with complaints that he is sleeping too much. Patient reports he is falling asleep in church which has become quite embarrassing. He has been taking NoDoz pills to help him stay awake however he has not noticed any improvement with these over-the-counter stimulants. Patient reports napping during the day but still falling asleep despite these midday naps. Reports this has been ongoing for several months. Patient has been told by his wife that he snores. He has had a sleep study in the past at [facility] and states "it was a waste of time and money". He is unable to explain what the results of the study revealed. Patient states that he had this test several years ago and will not agree to another test.   Past Medical History  Diagnosis Date  . Hypertrophy (benign) of prostate     asymptomatic on finasteride. will f/up with urologist [name]  . Hyperlipidemia   . Hypertension     Family History  Problem Relation Age of Onset  . Heart disease Mother   . Heart disease Father      History   Social History  . Marital Status: Married    Spouse Name: N/A    Number of Children: N/A  . Years of Education: N/A   Occupational History  . Retired    Social History Main Topics  . Smoking status: Never Smoker   . Smokeless tobacco: Never Used  . Alcohol Use: Yes     Comment: Occasional  . Drug Use: No  . Sexually Active: Not on file   Other Topics Concern  . 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     Current Outpatient Prescriptions on File Prior to Visit  Medication Sig  Dispense Refill  . Cholecalciferol (VITAMIN D3 PO) Take 1 tablet by mouth daily.        . finasteride (PROSCAR) 5 MG tablet Take 1 tablet (5 mg total) by mouth daily.  90 tablet  3  . fish oil-omega-3 fatty acids 1000 MG capsule Take 1 g by mouth daily.        . hydrochlorothiazide (HYDRODIURIL) 25 MG tablet Take 1 tablet (25 mg total) by mouth daily.  90 tablet  3  . niacin 250 MG tablet Take 250 mg by mouth daily.         No current facility-administered medications on file prior to visit.    Review of Systems  Constitutional: Negative.        No fatigue. Patient is very active. He plays tennis, walks 2-3 miles daily.  HENT: Negative.   Respiratory: Negative.   Cardiovascular: Negative.   Gastrointestinal: Negative.   Neurological: Negative for dizziness, tremors, syncope, weakness, light-headedness, numbness and headaches.  Psychiatric/Behavioral: Positive for sleep disturbance. Negative for behavioral problems, confusion and agitation.   BP 110/58  Pulse 57  Resp 12  Wt 213 lb 8 oz (96.843 kg)  BMI 32.47 kg/m2  SpO2 95%    Objective:   Physical Exam  Constitutional: He is oriented to person, place,  and time. He appears well-developed and well-nourished. No distress.  HENT:  Head: Normocephalic and atraumatic.  Right Ear: External ear normal.  Left Ear: External ear normal.  Cardiovascular: Regular rhythm, normal heart sounds and intact distal pulses.  Exam reveals no gallop and no friction rub.   No murmur heard. bradycardia  Pulmonary/Chest: Effort normal and breath sounds normal. No respiratory distress.  Abdominal: Soft. Bowel sounds are normal.  Neurological: He is alert and oriented to person, place, and time.  Psychiatric: He has a normal mood and affect. His behavior is normal. Judgment and thought content normal.       Assessment & Plan:

## 2012-12-05 NOTE — Patient Instructions (Addendum)
  Increased sleepiness during the day can be caused by multiple factors. Your blood pressure today during the visit was 110/58 and her pulse was 57. This may be contributing to your symptoms.  Please cut your Bystolic dose to 2.5 mg daily. According to your records you are taking 5 mg daily. Please get a pill slicer and cut the pill in half.  Monitor your blood pressure to make certain it does not become too elevated.  I would like to see you for follow up in 1-2 weeks.  Please have your blood work drawn prior to leaving the office.

## 2012-12-07 DIAGNOSIS — E871 Hypo-osmolality and hyponatremia: Secondary | ICD-10-CM | POA: Insufficient documentation

## 2012-12-07 NOTE — Addendum Note (Signed)
Addended by: Sherlene Shams on: 12/07/2012 01:41 PM   Modules accepted: Orders, Medications

## 2012-12-13 ENCOUNTER — Encounter: Payer: Self-pay | Admitting: Adult Health

## 2012-12-13 ENCOUNTER — Ambulatory Visit (INDEPENDENT_AMBULATORY_CARE_PROVIDER_SITE_OTHER): Payer: Medicare Other | Admitting: Adult Health

## 2012-12-13 VITALS — BP 112/60 | HR 60 | Resp 12 | Wt 216.0 lb

## 2012-12-13 DIAGNOSIS — E785 Hyperlipidemia, unspecified: Secondary | ICD-10-CM | POA: Diagnosis not present

## 2012-12-13 DIAGNOSIS — R899 Unspecified abnormal finding in specimens from other organs, systems and tissues: Secondary | ICD-10-CM

## 2012-12-13 DIAGNOSIS — G4710 Hypersomnia, unspecified: Secondary | ICD-10-CM | POA: Diagnosis not present

## 2012-12-13 DIAGNOSIS — R6889 Other general symptoms and signs: Secondary | ICD-10-CM

## 2012-12-13 NOTE — Progress Notes (Signed)
Subjective:    Patient ID: Christopher Jenkins, male    DOB: 1927/01/05, 79 y.o.   MRN: [id number]  HPI  Christopher Jenkins is a very pleasant 79 year old male who presents to clinic for followup of hypersomnolence. I saw him approximately one week ago where he was telling me that he was falling asleep whenever he sat down. He was never able to complete a movie, he was falling asleep at church. He is feeling that his quality of life is hindered secondary to this very bothersome symptom. We have discussed the possibility of a urinary tract infection, sleep apnea, an alteration in blood cells, metabolic status or his thyroid. We did blood work which showed very mild dehydration. He was asked to discontinue his fluid pill which he has done. I also noticed that his pulse was low so I asked him to cut his bystolic from 5 mg to 2.5 mg daily. It turns out that he was always taking 2.5 mg so he did not alter this at all.  Patient had expressed to me about experience with a sleep study years ago and was adamant about not having this repeated. His wife has told him that he snores. His urinalysis was negative for UTI, he did not want to repeat his sleep study and his thyroid function was normal. He reports today that his symptoms have not improved any. It is disturbing for him to think that this is what his life is going to be like from here on.   Current Outpatient Prescriptions on File Prior to Visit  Medication Sig Dispense Refill  . Cholecalciferol (VITAMIN D3 PO) Take 1 tablet by mouth daily.        . finasteride (PROSCAR) 5 MG tablet Take 1 tablet (5 mg total) by mouth daily.  90 tablet  3  . fish oil-omega-3 fatty acids 1000 MG capsule Take 1 g by mouth daily.        . nebivolol (BYSTOLIC) 2.5 MG tablet Take 1 tablet (2.5 mg total) by mouth daily.  30 tablet  6  . niacin 250 MG tablet Take 250 mg by mouth daily.         No current facility-administered medications on file prior to visit.     Review of  Systems  Constitutional: Negative.        Patient is very active. He plays tennis, golf. He interacts with his friends on a daily basis.  HENT: Negative.   Respiratory: Negative.   Cardiovascular: Negative.   Gastrointestinal: Negative.   Endocrine: Negative.   Genitourinary: Negative.   Musculoskeletal: Negative.   Neurological: Negative.   Psychiatric/Behavioral: Positive for sleep disturbance. Negative for suicidal ideas, hallucinations, behavioral problems, confusion, decreased concentration and agitation. The patient is not nervous/anxious.        Hypersomnolence    BP 112/60  Pulse 60  Resp 12  Wt 216 lb (97.977 kg)  BMI 32.85 kg/m2  SpO2 98%    Objective:   Physical Exam  Constitutional: He is oriented to person, place, and time. He appears well-developed and well-nourished. No distress.  HENT:  Head: Normocephalic and atraumatic.  Cardiovascular: Normal rate and regular rhythm.   HR 60.  Pulmonary/Chest: Effort normal. No respiratory distress.  Neurological: He is alert and oriented to person, place, and time. No cranial nerve deficit. Coordination normal.  Skin: Skin is warm and dry.  Psychiatric: He has a normal mood and affect. His behavior is normal. Judgment and thought content normal.  Assessment & Plan:

## 2012-12-13 NOTE — Patient Instructions (Addendum)
  Please return tomorrow morning for your fasting labs.  Once I get your results I will notify you.  I am ordering an overnight oxygen test. We will contact you with the information.  Your bystolic may be contributing to your feeling tired. I will speak with your physician to get her input on this and let you know.

## 2012-12-13 NOTE — Assessment & Plan Note (Signed)
Patient had recent abnormal labs showing elevated triglycerides and lipids; however, patient was not fasting. He will return tomorrow morning for repeat labs.

## 2012-12-13 NOTE — Assessment & Plan Note (Addendum)
Patient is currently on bystolic 2.5 mg with heart rate 60. We will stop the medication given that his blood pressure is low as well as his heart rate. There is a possibility that this medication is contributing to his symptoms. We will do an Overnight oximetry. [facility] will be [facility] that up for patient. If we find that his oxygen drops during the night perhaps he will be more agreeable to follow up with sleep study. If his symptoms persist, we can try a small dose of Ritalin.

## 2012-12-14 ENCOUNTER — Other Ambulatory Visit (INDEPENDENT_AMBULATORY_CARE_PROVIDER_SITE_OTHER): Payer: Medicare Other

## 2012-12-14 DIAGNOSIS — E785 Hyperlipidemia, unspecified: Secondary | ICD-10-CM | POA: Diagnosis not present

## 2012-12-14 DIAGNOSIS — R6889 Other general symptoms and signs: Secondary | ICD-10-CM

## 2012-12-14 DIAGNOSIS — E871 Hypo-osmolality and hyponatremia: Secondary | ICD-10-CM | POA: Diagnosis not present

## 2012-12-14 DIAGNOSIS — R899 Unspecified abnormal finding in specimens from other organs, systems and tissues: Secondary | ICD-10-CM

## 2012-12-14 LAB — COMPREHENSIVE METABOLIC PANEL
AST: 20 U/L (ref 0–37)
Albumin: 3.6 g/dL (ref 3.5–5.2)
Alkaline Phosphatase: 77 U/L (ref 39–117)
BUN: 19 mg/dL (ref 6–23)
Potassium: 4.1 mEq/L (ref 3.5–5.3)
Sodium: 141 mEq/L (ref 135–145)
Total Protein: 5.9 g/dL — ABNORMAL LOW (ref 6.0–8.3)

## 2012-12-14 LAB — LIPID PANEL
Total CHOL/HDL Ratio: 4.4 Ratio
VLDL: 20 mg/dL (ref 0–40)

## 2012-12-21 ENCOUNTER — Other Ambulatory Visit: Payer: Self-pay | Admitting: Internal Medicine

## 2013-01-16 DIAGNOSIS — R0602 Shortness of breath: Secondary | ICD-10-CM | POA: Diagnosis not present

## 2013-01-21 ENCOUNTER — Telehealth: Payer: Self-pay | Admitting: Adult Health

## 2013-01-21 NOTE — Telephone Encounter (Signed)
Left message for pt to return my call.

## 2013-01-21 NOTE — Telephone Encounter (Signed)
Regarding overnight oximetry done on 01/16/13 - Patient's oxygen levels are dropping while asleep. Normal oxygen saturation is 93% and above. The lowest reported on his study was 34, the highest was 93 and the average was 85. Total # of times that his oxygen dropped below normal was 269 times. This is contributing to his fatigue and tiredness during the day. I would strongly recommend a sleep study to evaluate the need for a CPAP machine. Please advise if patient agreeable to this.

## 2013-01-21 NOTE — Telephone Encounter (Signed)
Pt left voicemail. I called back and left a message to return my call.

## 2013-01-22 ENCOUNTER — Other Ambulatory Visit: Payer: Self-pay | Admitting: Adult Health

## 2013-01-22 DIAGNOSIS — R0902 Hypoxemia: Secondary | ICD-10-CM

## 2013-01-22 NOTE — Telephone Encounter (Signed)
Pt notified of results. Would like to proceed with sleep study.

## 2013-01-23 ENCOUNTER — Telehealth: Payer: Self-pay | Admitting: *Deleted

## 2013-01-23 NOTE — Telephone Encounter (Signed)
Patient called and ask if he should be taking HCTZ by Epic patient was to Discontinue due to side effects 12/07/12 explained this to patient .

## 2013-01-28 ENCOUNTER — Encounter: Payer: Self-pay | Admitting: Adult Health

## 2013-02-13 ENCOUNTER — Ambulatory Visit: Payer: Self-pay | Admitting: Adult Health

## 2013-02-13 DIAGNOSIS — G4733 Obstructive sleep apnea (adult) (pediatric): Secondary | ICD-10-CM | POA: Diagnosis not present

## 2013-02-20 DIAGNOSIS — G4761 Periodic limb movement disorder: Secondary | ICD-10-CM | POA: Diagnosis not present

## 2013-02-20 DIAGNOSIS — G4733 Obstructive sleep apnea (adult) (pediatric): Secondary | ICD-10-CM | POA: Diagnosis not present

## 2013-02-22 ENCOUNTER — Telehealth: Payer: Self-pay | Admitting: Internal Medicine

## 2013-02-22 NOTE — Telephone Encounter (Signed)
Pt asking for results of sleep study.

## 2013-02-22 NOTE — Telephone Encounter (Signed)
I have not received results yet. These take some time. Christopher Jenkins can you call to see if the results are available?

## 2013-02-24 ENCOUNTER — Telehealth: Payer: Self-pay | Admitting: Internal Medicine

## 2013-02-24 DIAGNOSIS — E669 Obesity, unspecified: Secondary | ICD-10-CM

## 2013-02-24 DIAGNOSIS — G4733 Obstructive sleep apnea (adult) (pediatric): Secondary | ICD-10-CM | POA: Insufficient documentation

## 2013-02-24 NOTE — Assessment & Plan Note (Signed)
His sleep study was abnormal,  He was not only diagnosed with moderate sleep apnea ut spent 2/3 of the time with oxygen sats < 90% and will need a titration study done.

## 2013-02-24 NOTE — Telephone Encounter (Signed)
His sleep study was abnormal,  He was not only diagnosed with moderate sleep apnea ut spent 2/3 of the time with oxygen sats < 90% and will need a titration study done.  

## 2013-02-25 NOTE — Telephone Encounter (Signed)
No

## 2013-02-25 NOTE — Telephone Encounter (Signed)
Patient notified of results and patient wanted to know if there is anything he can do until he has the titration study done.

## 2013-03-11 ENCOUNTER — Ambulatory Visit: Payer: Self-pay | Admitting: Internal Medicine

## 2013-03-11 DIAGNOSIS — G4761 Periodic limb movement disorder: Secondary | ICD-10-CM | POA: Diagnosis not present

## 2013-03-11 DIAGNOSIS — G4733 Obstructive sleep apnea (adult) (pediatric): Secondary | ICD-10-CM | POA: Diagnosis not present

## 2013-03-12 DIAGNOSIS — R339 Retention of urine, unspecified: Secondary | ICD-10-CM | POA: Diagnosis not present

## 2013-03-12 DIAGNOSIS — Z23 Encounter for immunization: Secondary | ICD-10-CM | POA: Diagnosis not present

## 2013-03-12 DIAGNOSIS — N401 Enlarged prostate with lower urinary tract symptoms: Secondary | ICD-10-CM | POA: Diagnosis not present

## 2013-03-15 DIAGNOSIS — G4761 Periodic limb movement disorder: Secondary | ICD-10-CM | POA: Diagnosis not present

## 2013-03-15 DIAGNOSIS — G4733 Obstructive sleep apnea (adult) (pediatric): Secondary | ICD-10-CM | POA: Diagnosis not present

## 2013-03-19 ENCOUNTER — Telehealth: Payer: Self-pay | Admitting: Internal Medicine

## 2013-03-19 NOTE — Telephone Encounter (Signed)
Would like him to come in for an OV to evaluate his memory.  15 minutes will do unless he has additional issues to discuss.  Reason:  The respiratory techs during his 2nd sleep study commented that he had forgotten where the sleep labs was from the first test to the second.

## 2013-03-22 NOTE — Telephone Encounter (Signed)
Scheduled patient appointment for next Tuesday FYI

## 2013-03-26 ENCOUNTER — Encounter: Payer: Self-pay | Admitting: Internal Medicine

## 2013-03-26 ENCOUNTER — Ambulatory Visit (INDEPENDENT_AMBULATORY_CARE_PROVIDER_SITE_OTHER): Payer: Medicare Other | Admitting: Internal Medicine

## 2013-03-26 VITALS — BP 110/62 | HR 55 | Temp 98.3°F | Wt 220.0 lb

## 2013-03-26 DIAGNOSIS — R4189 Other symptoms and signs involving cognitive functions and awareness: Secondary | ICD-10-CM

## 2013-03-26 DIAGNOSIS — R5381 Other malaise: Secondary | ICD-10-CM

## 2013-03-26 DIAGNOSIS — E538 Deficiency of other specified B group vitamins: Secondary | ICD-10-CM

## 2013-03-26 DIAGNOSIS — Z23 Encounter for immunization: Secondary | ICD-10-CM | POA: Diagnosis not present

## 2013-03-26 DIAGNOSIS — R413 Other amnesia: Secondary | ICD-10-CM

## 2013-03-26 LAB — CBC WITH DIFFERENTIAL/PLATELET
Basophils Absolute: 0 10*3/uL (ref 0.0–0.1)
Basophils Relative: 0.6 % (ref 0.0–3.0)
Eosinophils Absolute: 0.1 10*3/uL (ref 0.0–0.7)
Eosinophils Relative: 2.1 % (ref 0.0–5.0)
Lymphocytes Relative: 25.6 % (ref 12.0–46.0)
MCHC: 33.9 g/dL (ref 30.0–36.0)
Monocytes Relative: 6.5 % (ref 3.0–12.0)
Neutrophils Relative %: 65.2 % (ref 43.0–77.0)
Platelets: 171 10*3/uL (ref 150.0–400.0)
RBC: 4.87 Mil/uL (ref 4.22–5.81)
WBC: 6.6 10*3/uL (ref 4.5–10.5)

## 2013-03-26 LAB — VITAMIN B12: Vitamin B-12: 485 pg/mL (ref 211–911)

## 2013-03-26 LAB — COMPREHENSIVE METABOLIC PANEL
ALT: 14 U/L (ref 0–53)
AST: 20 U/L (ref 0–37)
Albumin: 3.5 g/dL (ref 3.5–5.2)
BUN: 17 mg/dL (ref 6–23)
CO2: 31 mEq/L (ref 19–32)
Calcium: 9 mg/dL (ref 8.4–10.5)
Chloride: 100 mEq/L (ref 96–112)
Potassium: 4 mEq/L (ref 3.5–5.1)

## 2013-03-26 LAB — TSH: TSH: 1.77 u[IU]/mL (ref 0.35–5.50)

## 2013-03-26 MED ORDER — ZOSTER VACCINE LIVE 19400 UNT/0.65ML ~~LOC~~ SOLR: 0.6500 mL | Freq: Once | SUBCUTANEOUS | Status: DC

## 2013-03-26 MED ORDER — TETANUS-DIPHTH-ACELL PERTUSSIS 5-2.5-18.5 LF-MCG/0.5 IM SUSP: 0.5000 mL | Freq: Once | INTRAMUSCULAR | Status: DC

## 2013-03-26 NOTE — Progress Notes (Signed)
Patient ID: Christopher Jenkins, male   DOB: [birth year], 79 y.o.   MRN: [id number]   Patient Active Problem List   Diagnosis Date Noted  . OSA (obstructive sleep apnea) 02/24/2013  . Hyponatremia 12/07/2012  . Hypersomnolence 07/08/2012  . Memory loss, short term 06/10/2012  . Do not resuscitate discussion 06/06/2012  . Tubular adenoma of colon 02/08/2012  . Hematuria, gross 02/08/2012  . Obesity (BMI 30-39.9) 12/17/2011  . Screening for colon cancer 05/13/2011  . Hypertrophy (benign) of prostate   . Hyperlipidemia   . Hypertension     Subjective:  CC:   Chief Complaint  Patient presents with  . Discuss Sleep  study    Here today to further discuss the results of the 2 sleep studies he has done at  [facility]    HPI:   Christopher Jenkins a 79 y.o. male who presents for Follow up on concerns raised by the staff at the sleep center regarding patient's confusion witnessed at follow up sleep study.  Patient admist that he has been having difficulty remembering the names of  Friends and acquaintenances which he finds embarrassing.  He has not had any trouble finding his care in parking lots or finding his way home or finding well known landmarks in town.  He is a retired Warden/ranger. He drinks no more than 1 or 2 glasses of wine daily.  His sleep study was positive for moderate to severe OSA and he underwent a CPAP titration study last week but has not received his mask yet.  His main concern is daytime somnolence.        Past Medical History  Diagnosis Date  . Hypertrophy (benign) of prostate     asymptomatic on finasteride. will f/up with urologist [name]  . Hyperlipidemia   . Hypertension     History reviewed. No pertinent past surgical history.     The following portions of the patient's history were reviewed and updated as appropriate: Allergies, current medications, and problem list.    Review of Systems:   12 Pt  review of systems was negative  except those addressed in the HPI,     History   Social History  . Marital Status: Married    Spouse Name: N/A    Number of Children: N/A  . Years of Education: N/A   Occupational History  . Retired    Social History Main Topics  . Smoking status: Never Smoker   . Smokeless tobacco: Never Used  . Alcohol Use: Yes     Comment: Occasional  . Drug Use: No  . Sexual Activity: Not on file   Other Topics Concern  . 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    Objective:  Filed Vitals:   03/26/13 0842  BP: 110/62  Pulse: 55  Temp: 98.3 F (36.8 C)     General appearance: alert, cooperative and appears stated age Lungs: clear to auscultation bilaterally Heart: regular rate and rhythm, S1, S2 normal, no murmur, click, rub or gallop Neurologic: MMSE:   Christopher Jenkins. 2014  Oct 7th   City? Christopher Jenkins,  Christopher Jenkins   [place]  Nickels in 1.35?  27 with some difficulty (first answer was 23,  Took a while)   orange  juice telephone  and laptop 2 of 3 initially,   Letter on sidewalk? Drop it in mailbox (again with much deliberation)  Clock reading ok  ?  Yes.  Draw a clock to read 11:10?  Drew a clock that read 10:10   Copying Geometric shapes :  Fine    Naming animals and words starting with F?  Animals  2   Words 10    Assessment and Plan:  Memory loss, short term He has had difficulty recalling recent medical events and details of procedures done recently.  He has been screened for reversible causes.  His MMSE exam today was notable for trouble with instant recall of objects (2/3) and Calculation  (# nickels in $1.35) .  He had significant difficulty naming animals (2/1minute) and words starting with F (10/74minute) .  We will repeat the test after a month of treating his apnea and if still poor  ,  MRI and neurology evaluation .    A total of 30 minutes of face to face time was spent with patient more than half of which was spent in counselling and coordination of care   Updated Medication List Outpatient Encounter Prescriptions as of 03/26/2013  Medication Sig Dispense Refill  . finasteride (PROSCAR) 5 MG tablet Take 1 tablet (5 mg total) by mouth daily.  90 tablet  3  . fish oil-omega-3 fatty acids 1000 MG capsule Take 1 g by mouth daily.        . nebivolol (BYSTOLIC) 2.5 MG tablet Take 1 tablet (2.5 mg total) by mouth daily.  30 tablet  6  . niacin 250 MG tablet Take 250 mg by mouth daily.        . [DISCONTINUED] BYSTOLIC 2.5 MG tablet TAKE 1 TABLET BY MOUTH EVERY DAY  90 tablet  1  . Cholecalciferol (VITAMIN D3 PO) Take 1 tablet by mouth daily.        . TDaP (BOOSTRIX) 5-2.5-18.5 LF-MCG/0.5 injection Inject 0.5 mLs into the muscle once.  0.5 mL  0  . zoster vaccine live, PF, (ZOSTAVAX) 16109 UNT/0.65ML injection Inject 19,400 Units into the skin once.  1 each  0   No facility-administered encounter medications on file as of 03/26/2013.     Orders Placed This Encounter  Procedures  . Pneumococcal polysaccharide vaccine 23-valent greater than or equal to 2yo subcutaneous/IM  . TSH  . Vitamin B12  . CBC with Differential  . Comprehensive metabolic panel  . Varicella Zoster Abs, IgG/IgM    No Follow-up on file.

## 2013-03-26 NOTE — Patient Instructions (Addendum)
You had some difficulty on your memory test today.  i would like to repeat in in 3 months after your sleep apnea has been addressed  If you do not receive your sleep apnea mask by next Friday please let us known    You need to have a TDaP,  vaccine and a Shingles vaccine.  I have given you prescriptions for these  because they will be cheaper at the health Dept or at your  local pharmacy because Medicare will not reimburse for them.   You received the pneumonia vaccine today.  We will contact you with the bloodwork results

## 2013-03-27 ENCOUNTER — Encounter: Payer: Self-pay | Admitting: Internal Medicine

## 2013-03-27 DIAGNOSIS — R4189 Other symptoms and signs involving cognitive functions and awareness: Secondary | ICD-10-CM | POA: Insufficient documentation

## 2013-03-27 LAB — VARICELLA ZOSTER ABS, IGG/IGM: Varicella zoster IgG: 2120 index (ref >165)

## 2013-03-27 NOTE — Assessment & Plan Note (Signed)
He has had difficulty recalling recent medical events and details of procedures done recently.  He has been screened for reversible causes.  His MMSE exam today was notable for trouble with instant recall of objects (2/3) and Calculation  (# nickels in $1.35) .  He had significant difficulty naming animals (2/54minute) and words starting with F (10/103minute) .  We will repeat the test after a month of treating his apnea and if still poor ,  MRI and neurology evaluation .

## 2013-03-28 ENCOUNTER — Encounter: Payer: Self-pay | Admitting: *Deleted

## 2013-04-01 DIAGNOSIS — H02839 Dermatochalasis of unspecified eye, unspecified eyelid: Secondary | ICD-10-CM | POA: Diagnosis not present

## 2013-04-26 ENCOUNTER — Other Ambulatory Visit: Payer: Self-pay | Admitting: Internal Medicine

## 2013-05-10 ENCOUNTER — Encounter (INDEPENDENT_AMBULATORY_CARE_PROVIDER_SITE_OTHER): Payer: Self-pay

## 2013-05-10 ENCOUNTER — Ambulatory Visit (INDEPENDENT_AMBULATORY_CARE_PROVIDER_SITE_OTHER): Payer: Medicare Other | Admitting: Adult Health

## 2013-05-10 ENCOUNTER — Encounter: Payer: Self-pay | Admitting: Adult Health

## 2013-05-10 VITALS — BP 124/74 | HR 68 | Temp 98.5°F | Resp 16 | Wt 221.0 lb

## 2013-05-10 DIAGNOSIS — R05 Cough: Secondary | ICD-10-CM | POA: Diagnosis not present

## 2013-05-10 MED ORDER — AZITHROMYCIN 250 MG PO TABS: ORAL_TABLET | ORAL | Status: DC

## 2013-05-10 MED ORDER — FLUTICASONE PROPIONATE 50 MCG/ACT NA SUSP: 2.0000 | Freq: Every day | NASAL | Status: DC

## 2013-05-10 MED ORDER — GUAIFENESIN-CODEINE 100-10 MG/5ML PO SOLN: 5.0000 mL | Freq: Three times a day (TID) | ORAL | Status: DC | PRN

## 2013-05-10 NOTE — Patient Instructions (Addendum)
  Azithromycin 2 tablets today then one tablet daily for the next 4 days.  Start using Flonase nasal spray 2 sprays into each nostril daily.  Continue taking the over the counter medication you have been taking for symptom management.  Robitussin AC for severe cough. This medication has codeine and may make you sleepy.

## 2013-05-10 NOTE — Progress Notes (Signed)
  Subjective:    Patient ID: Demetra Shiner, male    DOB: [birth year], 79 y.o.   MRN: [id number]  HPI  Pt is a pleasant 79 yo male, presents to clinic with sinus congestion and cough x 4 days, states symptoms started suddenly Monday morning.  Pt states cough is occasionally productive, unsure of characteristics of sputum. Pt has been taking an OTC medication, cannot recall name.  Pt denies body aches, fatigue, fever, or chills. Denies shortness of breath.  Current Outpatient Prescriptions on File Prior to Visit  Medication Sig Dispense Refill  . BYSTOLIC 2.5 MG tablet TAKE 1 TABLET BY MOUTH EVERY DAY  90 tablet  1  . Cholecalciferol (VITAMIN D3 PO) Take 1 tablet by mouth daily.        . finasteride (PROSCAR) 5 MG tablet Take 1 tablet (5 mg total) by mouth daily.  90 tablet  3  . fish oil-omega-3 fatty acids 1000 MG capsule Take 1 g by mouth daily.        . nebivolol (BYSTOLIC) 2.5 MG tablet Take 1 tablet (2.5 mg total) by mouth daily.  30 tablet  6  . niacin 250 MG tablet Take 250 mg by mouth daily.        . TDaP (BOOSTRIX) 5-2.5-18.5 LF-MCG/0.5 injection Inject 0.5 mLs into the muscle once.  0.5 mL  0  . zoster vaccine live, PF, (ZOSTAVAX) 91478 UNT/0.65ML injection Inject 19,400 Units into the skin once.  1 each  0   No current facility-administered medications on file prior to visit.      Review of Systems  Constitutional: Negative for fever, chills and fatigue.  HENT: Positive for congestion, sinus pressure and voice change. Negative for sore throat and trouble swallowing.   Respiratory: Positive for cough. Negative for chest tightness, shortness of breath and wheezing.   Cardiovascular: Negative for chest pain.       Objective:   Physical Exam  Constitutional: He is oriented to person, place, and time. No distress.  Pleasant, overweight male.  HENT:  Mouth/Throat: Oropharynx is clear and moist. No oropharyngeal exudate.  Cardiovascular: Normal rate and regular rhythm.   Exam reveals no gallop.   No murmur heard. Pulmonary/Chest: Effort normal. No respiratory distress. He has no wheezes. He has rhonchi in the left middle field and the left lower field. He has no rales.  Lymphadenopathy:    He has no cervical adenopathy.  Neurological: He is alert and oriented to person, place, and time.  Skin: Skin is warm and dry.  Psychiatric: He has a normal mood and affect. His behavior is normal. Judgment and thought content normal.    BP 124/74  Pulse 68  Temp(Src) 98.5 F (36.9 C) (Oral)  Wt 221 lb (100.245 kg)  SpO2 92%      Assessment & Plan:

## 2013-05-10 NOTE — Progress Notes (Signed)
Pre visit review using our clinic review tool, if applicable. No additional management support is needed unless otherwise documented below in the visit note. 

## 2013-05-10 NOTE — Assessment & Plan Note (Signed)
Patient presents with cough and UR symptoms. Rhonchi does not clear. Start Azithromycin and flonase. Robitussin AC for cough. If no improvement by Monday will send for chest xray

## 2013-05-22 DIAGNOSIS — H02839 Dermatochalasis of unspecified eye, unspecified eyelid: Secondary | ICD-10-CM | POA: Diagnosis not present

## 2013-05-28 ENCOUNTER — Encounter: Payer: Self-pay | Admitting: Internal Medicine

## 2013-05-28 ENCOUNTER — Ambulatory Visit (INDEPENDENT_AMBULATORY_CARE_PROVIDER_SITE_OTHER): Payer: Medicare Other | Admitting: Internal Medicine

## 2013-05-28 VITALS — BP 110/62 | HR 49 | Temp 97.8°F | Resp 14 | Ht 68.0 in | Wt 207.5 lb

## 2013-05-28 DIAGNOSIS — R413 Other amnesia: Secondary | ICD-10-CM

## 2013-05-28 DIAGNOSIS — G4733 Obstructive sleep apnea (adult) (pediatric): Secondary | ICD-10-CM | POA: Diagnosis not present

## 2013-05-28 DIAGNOSIS — I10 Essential (primary) hypertension: Secondary | ICD-10-CM

## 2013-05-28 DIAGNOSIS — E669 Obesity, unspecified: Secondary | ICD-10-CM | POA: Diagnosis not present

## 2013-05-28 DIAGNOSIS — K5900 Constipation, unspecified: Secondary | ICD-10-CM | POA: Diagnosis not present

## 2013-05-28 LAB — COMPREHENSIVE METABOLIC PANEL
ALT: 27 U/L (ref 0–53)
AST: 48 U/L — ABNORMAL HIGH (ref 0–37)
Albumin: 3.9 g/dL (ref 3.5–5.2)
CO2: 26 mEq/L (ref 19–32)
Calcium: 8.9 mg/dL (ref 8.4–10.5)
Chloride: 96 mEq/L (ref 96–112)
GFR: 83.31 mL/min (ref >60.00)
Potassium: 4.2 mEq/L (ref 3.5–5.1)
Sodium: 132 mEq/L — ABNORMAL LOW (ref 135–145)
Total Protein: 6.8 g/dL (ref 6.0–8.3)

## 2013-05-28 NOTE — Patient Instructions (Signed)
I am glad you are tolerating your CPAP machine and feeling better  i am checking your kidney function and electrolytes today to make sure your diet is not affecting them

## 2013-05-28 NOTE — Progress Notes (Signed)
Patient ID: Christopher Jenkins, male   DOB: 1926-10-23, 80 y.o.   MRN: [id number]   Patient Active Problem List   Diagnosis Date Noted  . Obesity, unspecified 05/30/2013  . OSA (obstructive sleep apnea) 02/24/2013  . Hyponatremia 12/07/2012  . Hypersomnolence 07/08/2012  . Memory loss, short term 06/10/2012  . Do not resuscitate discussion 06/06/2012  . Tubular adenoma of colon 02/08/2012  . Hematuria, gross 02/08/2012  . Obesity (BMI 30-39.9) 12/17/2011  . Screening for colon cancer 05/13/2011  . Hypertrophy (benign) of prostate   . Hyperlipidemia   . Hypertension     Subjective:  CC:   Chief Complaint  Patient presents with  . Follow-up    sleep study    HPI:   Christopher Jenkins a 79 y.o. male who presents for Followup on chronic conditions including recent diagnosis of Sleep apnea and short term memory loss .  THE OSA was diagnosed in September after finally consenting to evaluation for persistent daytime somnolence and snoring. CPAP ordered .  Has been tolerating the mask and wearing it every night since mid October , and has no trouble falling back to sleep   even after getting up to void .  No longer falling alseep during the day   Has intentionalyl lost 14 lbs in 18 days  by dieting using a 9 day commercial weight loss program recommended by his son. Using commeryical diet product including medicaitons and protein shakes.  Isovexor?  Not sure of name.  Having increased urination,  No bowel movements in 6 days .  He continues trouble remembering the names of medications and recent events.   Past Medical History  Diagnosis Date  . Hypertrophy (benign) of prostate     asymptomatic on finasteride. will f/up with urologist [name]  . Hyperlipidemia   . Hypertension     History reviewed. No pertinent past surgical history.   The following portions of the patient's history were reviewed and updated as appropriate: Allergies, current medications, and  problem list.   Review of Systems:   Patient denies headache, fevers, malaise, unintentional weight loss, skin rash, eye pain, sinus congestion and sinus pain, sore throat, dysphagia,  hemoptysis , cough, dyspnea, wheezing, chest pain, palpitations, orthopnea, edema, abdominal pain, nausea, melena, diarrhea, constipation, flank pain, dysuria, hematuria, urinary  Frequency, nocturia, numbness, tingling, seizures,  Focal weakness, Loss of consciousness,  Tremor, insomnia, depression, anxiety, and suicidal ideation.     History   Social History  . Marital Status: Married    Spouse Name: N/A    Number of Children: N/A  . Years of Education: N/A   Occupational History  . Retired    Social History Main Topics  . Smoking status: Never Smoker   . Smokeless tobacco: Never Used  . Alcohol Use: Yes     Comment: Occasional  . Drug Use: No  . Sexual Activity: Not on file   Other Topics Concern  . 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    Objective:  Filed Vitals:   05/28/13 1357  BP: 110/62  Pulse: 49  Temp: 97.8 F (36.6 C)  Resp: 14     General appearance: alert, cooperative and appears stated age Ears: normal TM's and external ear canals  both ears Throat: lips, mucosa, and tongue normal; teeth and gums normal Neck: no adenopathy, no carotid bruit, supple, symmetrical, trachea midline and thyroid not enlarged, symmetric, no tenderness/mass/nodules Back: symmetric, no curvature. ROM normal. No CVA tenderness. Lungs: clear to auscultation bilaterally Heart: regular rate and rhythm, S1, S2 normal, no murmur, click, rub or gallop Abdomen: soft, non-tender; bowel sounds normal; no masses,  no organomegaly Pulses: 2+ and symmetric Skin: Skin color, texture, turgor normal. No rashes or  lesions Lymph nodes: Cervical, supraclavicular, and axillary nodes normal.  Assessment and Plan:  OSA (obstructive sleep apnea) He has been compliant with use of CPAP since his diagnosis was made in September. He reports improved daytime alertness.    Memory loss, short term His memory is really not improved despite management of sleep apnea. Will recommend MRI and neurology evaluation. Reversible causes has been have been ruled out  Obesity (BMI 30-39.9) He has lost weight rapidly using a commercial product endorsed by his son which he cannot name for me today. His liver enzyme unfortunately slightly elevated. Not sure whether this is due to the supplements he is taking on this diet we'll have him repeat this in one week.  Hypertension Well controlled on current regimen. Renal function stable, no changes today.  Lab Results  Component Value Date   CREATININE 0.9 05/28/2013     Obesity, unspecified He has lost a significant amount of weight in a very short period time using a commercial product that he cannot name for me today. I checked to see that today and he is hyponatremic and has a slight elevation of his ALT. He does note he has been urinating frequently on this diet and I suspect it is a natural diuretic in it. I will have him return in one week for repeat blood work    Updated Medication List Outpatient Encounter Prescriptions as of 05/28/2013  Medication Sig  . BYSTOLIC 2.5 MG tablet TAKE 1 TABLET BY MOUTH EVERY DAY  . Cholecalciferol (VITAMIN D3 PO) Take 1 tablet by mouth daily.    . finasteride (PROSCAR) 5 MG tablet Take 1 tablet (5 mg total) by mouth daily.  . fish oil-omega-3 fatty acids 1000 MG capsule Take 1 g by mouth daily.    . nebivolol (BYSTOLIC) 2.5 MG tablet Take 1 tablet (2.5 mg total) by mouth daily.  . niacin 250 MG tablet Take 250 mg by mouth daily.    Marland Kitchen zoster vaccine live, PF, (ZOSTAVAX) 16109 UNT/0.65ML injection Inject 19,400 Units into the skin once.   . fluticasone (FLONASE) 50 MCG/ACT nasal spray Place 2 sprays into both nostrils daily.  Marland Kitchen guaiFENesin-codeine 100-10 MG/5ML syrup Take 5 mLs by mouth 3 (three) times daily as needed for cough.  . TDaP (BOOSTRIX) 5-2.5-18.5 LF-MCG/0.5 injection Inject 0.5 mLs into the muscle once.  . [DISCONTINUED] azithromycin (ZITHROMAX) 250 MG tablet Take 2 tablets today and then 1 tablet daily for the next 4 days.     Orders Placed This Encounter  Procedures  . Comprehensive metabolic panel  . Magnesium  . Comp Met (CMET)    Return in about 3 months (around 08/26/2013).

## 2013-05-30 ENCOUNTER — Encounter: Payer: Self-pay | Admitting: Internal Medicine

## 2013-05-30 DIAGNOSIS — E669 Obesity, unspecified: Secondary | ICD-10-CM | POA: Insufficient documentation

## 2013-05-30 DIAGNOSIS — H02839 Dermatochalasis of unspecified eye, unspecified eyelid: Secondary | ICD-10-CM | POA: Diagnosis not present

## 2013-05-30 NOTE — Assessment & Plan Note (Signed)
He has been compliant with use of CPAP since his diagnosis was made in September. He reports improved daytime alertness.

## 2013-05-30 NOTE — Assessment & Plan Note (Signed)
His memory is really not improved despite management of sleep apnea. Will recommend MRI and neurology evaluation. Reversible causes has been have been ruled out

## 2013-05-30 NOTE — Assessment & Plan Note (Signed)
He has lost weight rapidly using a commercial product endorsed by his son which he cannot name for me today. His liver enzyme unfortunately slightly elevated. Not sure whether this is due to the supplements he is taking on this diet we'll have him repeat this in one week.

## 2013-05-30 NOTE — Assessment & Plan Note (Signed)
Well controlled on current regimen. Renal function stable, no changes today.  Lab Results  Component Value Date   CREATININE 0.9 05/28/2013

## 2013-05-30 NOTE — Assessment & Plan Note (Signed)
He has lost a significant amount of weight in a very short period time using a commercial product that he cannot name for me today. I checked to see that today and he is hyponatremic and has a slight elevation of his ALT. He does note he has been urinating frequently on this diet and I suspect it is a natural diuretic in it. I will have him return in one week for repeat blood work

## 2013-05-31 ENCOUNTER — Encounter: Payer: Self-pay | Admitting: Adult Health

## 2013-06-06 ENCOUNTER — Other Ambulatory Visit: Payer: Medicare Other

## 2013-06-06 ENCOUNTER — Other Ambulatory Visit (INDEPENDENT_AMBULATORY_CARE_PROVIDER_SITE_OTHER): Payer: Medicare Other

## 2013-06-06 DIAGNOSIS — E669 Obesity, unspecified: Secondary | ICD-10-CM | POA: Diagnosis not present

## 2013-06-07 LAB — COMPREHENSIVE METABOLIC PANEL
ALT: 18 U/L (ref 0–53)
AST: 26 U/L (ref 0–37)
Albumin: 3.8 g/dL (ref 3.5–5.2)
Alkaline Phosphatase: 77 U/L (ref 39–117)
BUN: 14 mg/dL (ref 6–23)
Creatinine, Ser: 0.9 mg/dL (ref 0.4–1.5)
Glucose, Bld: 85 mg/dL (ref 70–99)
Sodium: 139 mEq/L (ref 135–145)

## 2013-06-25 ENCOUNTER — Ambulatory Visit: Payer: Self-pay

## 2013-06-25 DIAGNOSIS — K219 Gastro-esophageal reflux disease without esophagitis: Secondary | ICD-10-CM | POA: Diagnosis not present

## 2013-06-25 DIAGNOSIS — I10 Essential (primary) hypertension: Secondary | ICD-10-CM | POA: Diagnosis not present

## 2013-06-25 DIAGNOSIS — Z79899 Other long term (current) drug therapy: Secondary | ICD-10-CM | POA: Diagnosis not present

## 2013-06-25 DIAGNOSIS — G4730 Sleep apnea, unspecified: Secondary | ICD-10-CM | POA: Diagnosis not present

## 2013-06-25 DIAGNOSIS — Z7982 Long term (current) use of aspirin: Secondary | ICD-10-CM | POA: Diagnosis not present

## 2013-06-25 DIAGNOSIS — H02839 Dermatochalasis of unspecified eye, unspecified eyelid: Secondary | ICD-10-CM | POA: Diagnosis not present

## 2013-06-25 DIAGNOSIS — H02409 Unspecified ptosis of unspecified eyelid: Secondary | ICD-10-CM | POA: Diagnosis not present

## 2013-07-04 ENCOUNTER — Telehealth: Payer: Self-pay | Admitting: Internal Medicine

## 2013-07-04 DIAGNOSIS — I10 Essential (primary) hypertension: Secondary | ICD-10-CM

## 2013-07-04 MED ORDER — NEBIVOLOL HCL 2.5 MG PO TABS: 2.5000 mg | ORAL_TABLET | Freq: Every day | ORAL | Status: DC

## 2013-07-04 NOTE — Telephone Encounter (Signed)
Patient called states he only has 9 bystolic pills left and he needs some called in. He left this on the voicemail.

## 2013-07-04 NOTE — Telephone Encounter (Signed)
Script sent patient notified. FYI

## 2013-07-05 MED ORDER — NEBIVOLOL HCL 2.5 MG PO TABS: 2.5000 mg | ORAL_TABLET | Freq: Every day | ORAL | Status: DC

## 2013-07-05 NOTE — Telephone Encounter (Signed)
Script sent electronically again.

## 2013-07-05 NOTE — Addendum Note (Signed)
Addended by: Nanci Pina on: 07/05/2013 11:23 AM   Modules accepted: Orders

## 2013-07-09 ENCOUNTER — Ambulatory Visit (INDEPENDENT_AMBULATORY_CARE_PROVIDER_SITE_OTHER): Payer: Medicare Other | Admitting: Internal Medicine

## 2013-07-09 ENCOUNTER — Encounter: Payer: Self-pay | Admitting: Internal Medicine

## 2013-07-09 VITALS — BP 128/64 | HR 50 | Temp 98.2°F | Resp 18 | Wt 213.5 lb

## 2013-07-09 DIAGNOSIS — G4733 Obstructive sleep apnea (adult) (pediatric): Secondary | ICD-10-CM

## 2013-07-09 DIAGNOSIS — R413 Other amnesia: Secondary | ICD-10-CM | POA: Diagnosis not present

## 2013-07-09 DIAGNOSIS — Z9989 Dependence on other enabling machines and devices: Secondary | ICD-10-CM

## 2013-07-09 NOTE — Progress Notes (Signed)
Patient ID: Christopher Jenkins, male   DOB: 22-Feb-1927, 80 y.o.   MRN: [id number]   Patient Active Problem List   Diagnosis Date Noted  . Obesity, unspecified 05/30/2013  . OSA (obstructive sleep apnea) 02/24/2013  . Hyponatremia 12/07/2012  . Hypersomnolence 07/08/2012  . Memory loss, short term 06/10/2012  . Do not resuscitate discussion 06/06/2012  . Tubular adenoma of colon 02/08/2012  . Hematuria, gross 02/08/2012  . Obesity (BMI 30-39.9) 12/17/2011  . Screening for colon cancer 05/13/2011  . Hypertrophy (benign) of prostate   . Hyperlipidemia   . Hypertension     Subjective:  CC:   No chief complaint on file.   HPI:   Christopher Jenkins a 79 y.o. male who presents Follow up on multiple issues:  1) OSA diagnosed in Sept 2014.  Has been using CPAP since October with good results.     Compliance report shows 97% compliance with > 4 hrs of use nightly.  However his  AHI was still high at 10.3   On CPAP setting of  13 cm H20, which is the pressure recommended on CPAP titration study that was  done in September.  Patient has had some difficulty adjusting to the nasal pillows,  also notes that the tubing/hosing  is moist prior to using it every night, even though it has been left to dry for at least 12 hours.  He is cleaning the long hose once per week and the mask daily with a liquid soap followed by a water /vinegar  In a 3:1 ratio .    2) Ptosis:  He had bilateral ptosis surgery last month by Dr. Vickki Muff in [facility]  3) Cognitive changes  He continues to report trouble remembering the names of people, products, medications and other proper nouns.   He is not losing his keys or getting lost while driving.     Past Medical History  Diagnosis Date  . Hypertrophy (benign) of prostate     asymptomatic on finasteride. will f/up with urologist [name]  . Hyperlipidemia   . Hypertension     History reviewed. No pertinent past surgical history.     The  following portions of the patient's history were reviewed and updated as appropriate: Allergies, current medications, and problem list.    Review of Systems:   12 Pt  review of systems was negative except those addressed in the HPI,     History   Social History  . Marital Status: Married    Spouse Name: N/A    Number of Children: N/A  . Years of Education: N/A   Occupational History  . Retired    Social History Main Topics  . Smoking status: Never Smoker   . Smokeless tobacco: Never Used  . Alcohol Use: Yes     Comment: Occasional  . Drug Use: No  . Sexual Activity: Not on file   Other Topics Concern  . 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    Objective:  Filed Vitals:   07/09/13 1443  BP: 128/64  Pulse: 50  Temp: 98.2 F (36.8 C)  Resp: 18     General appearance: alert, cooperative and appears stated age Ears: normal TM's and external ear canals both ears Throat:  lips, mucosa, and tongue normal; teeth and gums normal Neck: no adenopathy, no carotid bruit, supple, symmetrical, trachea midline and thyroid not enlarged, symmetric, no tenderness/mass/nodules Back: symmetric, no curvature. ROM normal. No CVA tenderness. Lungs: clear to auscultation bilaterally Heart: regular rate and rhythm, S1, S2 normal, no murmur, click, rub or gallop Abdomen: soft, non-tender; bowel sounds normal; no masses,  no organomegaly Pulses: 2+ and symmetric Skin: Skin color, texture, turgor normal. No rashes or lesions Lymph nodes: Cervical, supraclavicular, and axillary nodes normal. Neurologic:  MMSE:  Date Jul 10 2013  President: Christopher Jenkins  Locale:  [place]  [place] # nickels in 1.20 ? 20 +4  (CONSIDERABLE DIFFICULTY ) CLOCK DRAWING   EXCELLENT   Judgement:  What to doe with  addressdd stamped envelope found on the ground,  ?  MAIL IT  RECALL of objects  ;  2 OF 3  At 2 minutes  ANIMALS NAMED  In 60 sec   4   WORKS STARTING WITH LETTER F :   12     Assessment and Plan:  Memory loss, short term His memory is really not improved despite management of sleep apnea. Will recommend MRI and neurology evaluation. Reversible causes have been have been ruled out    OSA (obstructive sleep apnea) Moderate, with 64% of the study time spent with 02 desats < 90% and AHI 21.3/hr .   Compliance trudy shows 97% compliance but AHI remains elevated at 10.3 .  Questions about retained moisture in the tubing need to be addressed due to the inherent risk of infection with legionella and other water borne pathogens.   A total of 40 minutes was spent with patient more than half of which was spent in counseling, reviewing records from other prviders and coordination of care.   Updated Medication List Outpatient Encounter Prescriptions as of 07/09/2013  Medication Sig  . BYSTOLIC 2.5 MG tablet TAKE 1 TABLET BY MOUTH EVERY DAY  . Cholecalciferol (VITAMIN D3 PO) Take 1 tablet by mouth daily.    . finasteride (PROSCAR) 5 MG tablet Take 1 tablet (5 mg total) by mouth daily.  . fish oil-omega-3 fatty acids 1000 MG capsule Take 1 g by mouth daily.    . nebivolol (BYSTOLIC) 2.5 MG tablet Take 1 tablet (2.5 mg total) by mouth daily.  . niacin 250 MG tablet Take 250 mg by mouth daily.    . TDaP (BOOSTRIX) 5-2.5-18.5 LF-MCG/0.5 injection Inject 0.5 mLs into the muscle once.  . zoster vaccine live, PF, (ZOSTAVAX) 91638 UNT/0.65ML injection Inject 19,400 Units into the skin once.  . fluticasone (FLONASE) 50 MCG/ACT nasal spray Place 2 sprays into both nostrils daily.  Marland Kitchen guaiFENesin-codeine 100-10 MG/5ML syrup Take 5 mLs by mouth 3 (three) times daily as needed for cough.     Orders Placed This Encounter  Procedures  . For home use only DME continuous positive airway pressure (CPAP)  . MR  Brain Wo Contrast    No Follow-up on file.

## 2013-07-09 NOTE — Assessment & Plan Note (Addendum)
His memory is really not improved despite management of sleep apnea. Will recommend MRI and neurology evaluation. Reversible causes have been have been ruled out

## 2013-07-09 NOTE — Progress Notes (Signed)
Pre-visit discussion using our clinic review tool. No additional management support is needed unless otherwise documented below in the visit note.  

## 2013-07-09 NOTE — Patient Instructions (Addendum)
I am ordering an MRI of your brain to evaluate your short term memory loss   Your CPAP settings need to be adjusted,  We will contact sleep med about this    I want you to lose weight.,  Your goal is 195 lbs,  Which is a BMI of 29.6   This is  my version of a  "Low GI"  Diet:  It will still lower your blood sugars and allow you to lose 4 to 8  lbs  per month if you follow it carefully.  Your goal with exercise is a minimum of 30 minutes of aerobic exercise 5 days per week (Walking does not count once it becomes easy!)    All of the foods can be found at grocery stores and in bulk at [employer].  The Atkins protein bars and shakes are available in more varieties at [employer], [employer] and [place].     7 AM Breakfast:  Choose from the following:  Low carbohydrate Protein  Shakes (I recommend the EAS AdvantEdge "Carb Control" shakes  Or the low carb shakes by Atkins.    2.5 carbs   Arnold's "Sandwhich Thin"toasted  w/ peanut butter (no jelly: about 20 net carbs  "Bagel Thin" with cream cheese and salmon: about 20 carbs   a scrambled egg/bacon/cheese burrito made with Mission's "carb balance" whole wheat tortilla  (about 10 net carbs )   Avoid cereal and bananas, oatmeal and cream of wheat and grits. They are loaded with carbohydrates!   10 AM: high protein snack  Protein bar by Atkins (the snack size, under 200 cal, usually < 6 net carbs).    A stick of cheese:  Around 1 carb,  100 cal     Dannon Light n Fit [place] Yogurt  (80 cal, 8 carbs)  Other so called "protein bars" and Greek yogurts tend to be loaded with carbohydrates.  Remember, in food advertising, the word "energy" is synonymous for " carbohydrate."  Lunch:   A Sandwich using the bread choices listed, Can use any  Eggs,  lunchmeat, grilled meat or canned tuna), avocado, regular mayo/mustard  and cheese.  A Salad using blue cheese, ranch,  Goddess or vinagrette,  No croutons or "confetti" and no "candied nuts" but regular nuts OK.    No pretzels or chips.  Pickles and miniature sweet peppers are a good low carb alternative that provide a "crunch"  The bread is the only source of carbohydrate in a sandwich and  can be decreased by trying some of these alternatives to traditional loaf bread  Joseph's makes a pita bread and a flat bread that are 50 cal and 4 net carbs available at [place] and [place].  This can be toasted to use with hummous as well  Toufayan makes a low carb flatbread that's 100 cal and 9 net carbs available at [employer] and [employer]'s makes 2 sizes of  Low carb whole wheat tortilla  (The large one is 210 cal and 6 net carbs) Avoid "Low fat dressings, as well as Barry Brunner and [place] dressings They are loaded with sugar!   3 PM/ Mid day  Snack:  Consider  1 ounce of  almonds, walnuts, pistachios, pecans, peanuts,  Macadamia nuts or a nut medley.  Avoid "granola"; the dried cranberries and raisins are loaded with carbohydrates. Mixed nuts as long as there are no raisins,  cranberries or dried fruit.     6 PM  Dinner:  Meat/fowl/fish with a green salad, and either broccoli, cauliflower, green beans, spinach, brussel sprouts or  Lima beans. DO NOT BREAD THE PROTEIN!!      There is a low carb pasta by Dreamfield's that is acceptable and tastes great: only 5 digestible carbs/serving.( All grocery stores but BJs carry it )  Try Hurley Cisco Angelo's chicken piccata or chicken or eggplant parm over low carb pasta.(Lowes and BJs)   Marjory Lies Sanchez's "Carnitas" (pulled pork, no sauce,  0 carbs) or his beef pot roast to make a dinner burrito (at [employer]J's)  Pesto over low carb pasta (bj's sells a good quality pesto in the center refrigerated section of the deli   Whole wheat pasta is still full of digestible carbs and  Not as low in glycemic index as Dreamfield's.   Brown rice is still rice,  So skip the rice and noodles if you eat [place] or [place] (or at least limit to 1/2 cup)  9 PM snack :   Breyer's "low carb"  fudgsicle or  ice cream bar (Carb Smart line), or  Weight Watcher's ice cream bar , or another "no sugar added" ice cream;  a serving of fresh berries/cherries with whipped cream   Cheese or DANNON'S LlGHT N FIT G[place] YOGURT  Avoid bananas, pineapple, grapes  and watermelon on a regular basis because they are high in sugar.  THINK OF THEM AS DESSERT  Remember that snack Substitutions should be less than 10 NET carbs per serving and meals < 20 carbs. Remember to subtract fiber grams to get the "net carbs."

## 2013-07-10 ENCOUNTER — Encounter: Payer: Self-pay | Admitting: Internal Medicine

## 2013-07-10 NOTE — Assessment & Plan Note (Signed)
Moderate, with 64% of the study time spent with 02 desats < 90% and AHI 21.3/hr .   Compliance trudy shows 97% compliance but AHI remains elevated at 10.3 .  Questions about retained moisture in the tubing need to be addressed due to the inherent risk of infection with legionella and other water borne pathogens.

## 2013-07-12 ENCOUNTER — Ambulatory Visit: Payer: Self-pay | Admitting: Internal Medicine

## 2013-07-12 DIAGNOSIS — G319 Degenerative disease of nervous system, unspecified: Secondary | ICD-10-CM | POA: Diagnosis not present

## 2013-07-12 DIAGNOSIS — R413 Other amnesia: Secondary | ICD-10-CM | POA: Diagnosis not present

## 2013-07-15 ENCOUNTER — Telehealth: Payer: Self-pay | Admitting: Internal Medicine

## 2013-07-15 DIAGNOSIS — R413 Other amnesia: Secondary | ICD-10-CM

## 2013-07-15 NOTE — Telephone Encounter (Signed)
MRI of brain showed fairly typical generalized atrophy with age appropriate changes. No masses or evidence of prior strokes

## 2013-07-15 NOTE — Assessment & Plan Note (Signed)
MRI of brain showed fairly typical generalized atrophy with age appropriate changes. No masses or evidence of prior strokes 

## 2013-07-16 ENCOUNTER — Encounter: Payer: Self-pay | Admitting: Internal Medicine

## 2013-07-16 NOTE — Telephone Encounter (Signed)
He told me he he had ordered several products that he wanted to try turning her neck. I have no opinion about those. I am really happy to consider a trial of Aricept if he is willing to do that. He did like to make an appointment to discuss that we find.

## 2013-07-16 NOTE — Telephone Encounter (Signed)
Left message for pt to call back  °

## 2013-07-16 NOTE — Telephone Encounter (Signed)
Pt wants to know what the next step is, ie does he need an appt to discuss in more detail?

## 2013-07-16 NOTE — Telephone Encounter (Signed)
Patient states that he is assuming that since I am calling with results and Dr Derrel Nip isn't and since she is not requesting an appt to see him then Dr Derrel Nip is satisfied with the results.  He said he will "just let it ride" for right now.  FYI sent to Dr Derrel Nip

## 2013-07-18 DIAGNOSIS — H6120 Impacted cerumen, unspecified ear: Secondary | ICD-10-CM | POA: Diagnosis not present

## 2013-07-18 DIAGNOSIS — H905 Unspecified sensorineural hearing loss: Secondary | ICD-10-CM | POA: Diagnosis not present

## 2013-07-18 DIAGNOSIS — H903 Sensorineural hearing loss, bilateral: Secondary | ICD-10-CM | POA: Diagnosis not present

## 2013-08-13 ENCOUNTER — Encounter: Payer: Self-pay | Admitting: Internal Medicine

## 2013-09-09 ENCOUNTER — Other Ambulatory Visit: Payer: Self-pay | Admitting: Internal Medicine

## 2013-09-09 DIAGNOSIS — N400 Enlarged prostate without lower urinary tract symptoms: Secondary | ICD-10-CM | POA: Diagnosis not present

## 2013-09-09 DIAGNOSIS — R972 Elevated prostate specific antigen [PSA]: Secondary | ICD-10-CM | POA: Diagnosis not present

## 2013-10-01 ENCOUNTER — Telehealth: Payer: Self-pay

## 2013-10-01 NOTE — Telephone Encounter (Signed)
Patient has been scheduled for 4.20.15 @ 2:30.

## 2013-10-01 NOTE — Telephone Encounter (Signed)
Patient walked into office today and stated that while being seen at his dentist appt last Thursday 09/26/13, was told that is pulse was 40 (low) and BP was extremely elevated ( unsure on exact numbers). Patient also stated that he was unsure if he took his bystolic Rx before going to the dentist that day. BP and pulse were checked manually in office today, results as follow: Bp right arm: 124/68 @ 10:55am, Bp left arm: 128/70 @ 10:56, P: 57 @10 :53 and P: 58 @ 10:57. Patient also stated that he felt fine. No dizziness, lightheaded or headache noted.

## 2013-10-01 NOTE — Telephone Encounter (Signed)
Excellent message!  Thanks very much.  Needs appt with wife to discuss memory loss.

## 2013-10-07 ENCOUNTER — Ambulatory Visit (INDEPENDENT_AMBULATORY_CARE_PROVIDER_SITE_OTHER): Payer: Medicare Other | Admitting: Internal Medicine

## 2013-10-07 ENCOUNTER — Encounter: Payer: Self-pay | Admitting: Internal Medicine

## 2013-10-07 VITALS — BP 120/60 | HR 58 | Temp 98.5°F | Resp 16 | Wt 208.8 lb

## 2013-10-07 DIAGNOSIS — G4710 Hypersomnia, unspecified: Secondary | ICD-10-CM

## 2013-10-07 DIAGNOSIS — F068 Other specified mental disorders due to known physiological condition: Secondary | ICD-10-CM

## 2013-10-07 DIAGNOSIS — I10 Essential (primary) hypertension: Secondary | ICD-10-CM

## 2013-10-07 DIAGNOSIS — F0390 Unspecified dementia without behavioral disturbance: Secondary | ICD-10-CM

## 2013-10-07 NOTE — Patient Instructions (Addendum)
I recommend that you use  a weekly pill box for your medications to ensure that you do not take them incorrectly (too often,  Not often enough)  Your slow heart rate of 40 could have been caused by the Bystolic because it does affect pulse  A normal pulse is 55 or higher,  Too low can cause you to faint  We may need to use an alternative if the pulse drops to 40 again because this can be dangerous   Ask your RN to check BP and pulse once a week and sent to me in one month

## 2013-10-07 NOTE — Progress Notes (Signed)
Pre-visit discussion using our clinic review tool. No additional management support is needed unless otherwise documented below in the visit note.  

## 2013-10-08 NOTE — Assessment & Plan Note (Signed)
Advised him to start using a pill box.

## 2013-10-08 NOTE — Progress Notes (Signed)
Patient ID: Christopher Jenkins, male   DOB: 09/29/1926, 80 y.o.   MRN: [id number]  Patient Active Problem List   Diagnosis Date Noted  . Obesity, unspecified 05/30/2013  . OSA (obstructive sleep apnea) 02/24/2013  . Hyponatremia 12/07/2012  . Hypersomnolence 07/08/2012  . Dementia arising in the senium and presenium 06/10/2012  . Do not resuscitate discussion 06/06/2012  . Tubular adenoma of colon 02/08/2012  . Hematuria, gross 02/08/2012  . Obesity (BMI 30-39.9) 12/17/2011  . Screening for colon cancer 05/13/2011  . Hypertrophy (benign) of prostate   . Hyperlipidemia   . Hypertension     Subjective:  CC:   Chief Complaint  Patient presents with  . Follow-up    memory loss    HPI:   Christopher Jenkins is a 80 y.o. male who presents for Follow up on recent episode of bradycardia reported by dentist accompanied by elevated BP. Patient could not recall whether he had taken his bystolic that morning .  Does not use a pill box for his medications. States that he takes his medications when he shaves every morning.  Feels fine,  No presyncope or dizziness. Tolerating his CPAP and using it nightly for OSA but does not use it during his 1 hour afternoon nap. Has been diagnosed with dementia in the past year.    Past Medical History  Diagnosis Date  . Hypertrophy (benign) of prostate     asymptomatic on finasteride. will f/up with urologist [name]  . Hyperlipidemia   . Hypertension     No past surgical history on file.     The following portions of the patient's history were reviewed and updated as appropriate: Allergies, current medications, and problem list.    Review of Systems:   Patient denies headache, fevers, malaise, unintentional weight loss, skin rash, eye pain, sinus congestion and sinus pain, sore throat, dysphagia,  hemoptysis , cough, dyspnea, wheezing, chest pain, palpitations, orthopnea, edema, abdominal pain, nausea, melena, diarrhea,  constipation, flank pain, dysuria, hematuria, urinary  Frequency, nocturia, numbness, tingling, seizures,  Focal weakness, Loss of consciousness,  Tremor, insomnia, depression, anxiety, and suicidal ideation.     History   Social History  . Marital Status: Married    Spouse Name: N/A    Number of Children: N/A  . Years of Education: N/A   Occupational History  . Retired    Social History Main Topics  . Smoking status: Never Smoker   . Smokeless tobacco: Never Used  . Alcohol Use: Yes     Comment: Occasional  . Drug Use: No  . Sexual Activity: Not on file   Other Topics Concern  . 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    Objective:  Filed Vitals:   10/07/13 1430  BP: 120/60  Pulse: 58  Temp: 98.5 F (36.9 C)  Resp: 16     General appearance: alert, cooperative and appears stated age Ears: normal TM's and external ear canals both ears Throat: lips, mucosa, and tongue normal; teeth and gums normal Neck: no adenopathy, no carotid bruit, supple, symmetrical, trachea midline and thyroid not enlarged, symmetric, no tenderness/mass/nodules Back: symmetric, no curvature. ROM normal. No CVA tenderness. Lungs: clear to auscultation bilaterally Heart: regular rate and rhythm, S1, S2 normal,  no murmur, click, rub or gallop Abdomen: soft, non-tender; bowel sounds normal; no masses,  no organomegaly Pulses: 2+ and symmetric Skin: Skin color, texture, turgor normal. No rashes or lesions Lymph nodes: Cervical, supraclavicular, and axillary nodes normal.  Assessment and Plan:  Hypersomnolence Improved with use of CPAP for newly diagnosed OSA.  Using caffeine tablets to stay awake during passive activities such as attending lectures or concerts. Advised to use his CPAP for afternoon nap.    Hypertension Well controlled on current regimen, but recent episode may have been due to uninentional misuse of medications . Renal function stable, no changes today.Strongly advised to use pill box   Dementia arising in the senium and presenium Advised him to start using a pill box.    Updated Medication List Outpatient Encounter Prescriptions as of 10/07/2013  Medication Sig  . BYSTOLIC 2.5 MG tablet TAKE 1 TABLET BY MOUTH EVERY DAY  . Calcium Carbonate-Vitamin D (CALCIUM-VITAMIN D) 600-125 MG-UNIT TABS Take 1 tablet by mouth daily.  . Cholecalciferol (VITAMIN D3 PO) Take 1 tablet by mouth daily.    . finasteride (PROSCAR) 5 MG tablet TAKE 1 TABLET (5 MG TOTAL) BY MOUTH DAILY.  . fish oil-omega-3 fatty acids 1000 MG capsule Take 1 g by mouth daily.    . Multiple Vitamins-Minerals (MULTIVITAMIN WITH MINERALS) tablet Take 1 tablet by mouth daily.  . nebivolol (BYSTOLIC) 2.5 MG tablet Take 1 tablet (2.5 mg total) by mouth daily.  . niacin 250 MG tablet Take 250 mg by mouth daily.    . fluticasone (FLONASE) 50 MCG/ACT nasal spray Place 2 sprays into both nostrils daily.  Marland Kitchen guaiFENesin-codeine 100-10 MG/5ML syrup Take 5 mLs by mouth 3 (three) times daily as needed for cough.  . [DISCONTINUED] TDaP (BOOSTRIX) 5-2.5-18.5 LF-MCG/0.5 injection Inject 0.5 mLs into the muscle once.  . [DISCONTINUED] zoster vaccine live, PF, (ZOSTAVAX) 61950 UNT/0.65ML injection Inject 19,400 Units into the skin once.     No orders of the defined types were placed in this encounter.    Return in about 3 months (around 01/06/2014).

## 2013-10-08 NOTE — Assessment & Plan Note (Signed)
Improved with use of CPAP for newly diagnosed OSA.  Using caffeine tablets to stay awake during passive activities such as attending lectures or concerts. Advised to use his CPAP for afternoon nap.

## 2013-10-08 NOTE — Assessment & Plan Note (Signed)
Well controlled on current regimen, but recent episode may have been due to uninentional misuse of medications . Renal function stable, no changes today.Strongly advised to use pill box

## 2013-12-26 ENCOUNTER — Other Ambulatory Visit: Payer: Self-pay | Admitting: Internal Medicine

## 2013-12-26 ENCOUNTER — Encounter: Payer: Self-pay | Admitting: Podiatrist

## 2013-12-26 ENCOUNTER — Ambulatory Visit (INDEPENDENT_AMBULATORY_CARE_PROVIDER_SITE_OTHER): Payer: Medicare Other | Admitting: Podiatrist

## 2013-12-26 ENCOUNTER — Ambulatory Visit (INDEPENDENT_AMBULATORY_CARE_PROVIDER_SITE_OTHER): Payer: Medicare Other

## 2013-12-26 VITALS — BP 135/71 | HR 51 | Resp 16 | Ht 68.0 in | Wt 209.0 lb

## 2013-12-26 DIAGNOSIS — M779 Enthesopathy, unspecified: Secondary | ICD-10-CM | POA: Diagnosis not present

## 2013-12-26 DIAGNOSIS — S93409A Sprain of unspecified ligament of unspecified ankle, initial encounter: Secondary | ICD-10-CM | POA: Diagnosis not present

## 2013-12-26 NOTE — Progress Notes (Signed)
Chief Complaint  Patient presents with  . Foot Pain    my right ankle has been hurting for 3 weeks     HPI: Patient is 80 y.o. male who presents today for pain on the lateral aspect of the right ankle. He states he was playing tennis and he feels he may have stepped on a ball and injured his ankle. He states it's been swelling and hurting for 3 weeks and has yet to improve.     Physical Exam Patient is awake, alert, and oriented x 3.  In no acute distress.  Vascular status is intact with palpable pedal pulses at 2/4 DP and PT bilateral and capillary refill time within normal limits. Neurological sensation is also intact bilaterally via Semmes Weinstein monofilament at 5/5 sites. Light touch, vibratory sensation, Achilles tendon reflex is intact. Dermatological exam reveals skin color, turger and texture as normal. No open lesions present.  Musculature intact with dorsiflexion, plantarflexion, inversion, eversion. Pain on palpation at the anterior talofibular ligament is noted on the right ankle. Moderate swelling is also present. X-rays are negative for fracture.   Assessment: Right ankle sprain  Plan: Recommended a lace up ankle brace and physical therapy. He lives in [place] and he can get physical therapy there. A prescription was written. He was given information about ankle sprains and conservative care. He will be seen back as needed for followup.

## 2013-12-26 NOTE — Patient Instructions (Signed)
Ankle Sprain An ankle sprain is an injury to the strong, fibrous tissues (ligaments) that hold the bones of your ankle joint together.  CAUSES An ankle sprain is usually caused by a fall or by twisting your ankle. Ankle sprains most commonly occur when you step on the outer edge of your foot, and your ankle turns inward. People who participate in sports are more prone to these types of injuries.  SYMPTOMS   Pain in your ankle. The pain may be present at rest or only when you are trying to stand or walk.  Swelling.  Bruising. Bruising may develop immediately or within 1 to 2 days after your injury.  Difficulty standing or walking, particularly when turning corners or changing directions. DIAGNOSIS  Your caregiver will ask you details about your injury and perform a physical exam of your ankle to determine if you have an ankle sprain. During the physical exam, your caregiver will press on and apply pressure to specific areas of your foot and ankle. Your caregiver will try to move your ankle in certain ways. An X-ray exam may be done to be sure a bone was not broken or a ligament did not separate from one of the bones in your ankle (avulsion fracture).  TREATMENT  Certain types of braces can help stabilize your ankle. Your caregiver can make a recommendation for this. Your caregiver may recommend the use of medicine for pain.   HOME CARE INSTRUCTIONS   Apply ice to your injury for 1-2 days or as directed by your caregiver. Applying ice helps to reduce inflammation and pain.  Put ice in a plastic bag.  Place a towel between your skin and the bag.  Leave the ice on for 15-20 minutes at a time, every 2 hours while you are awake.  Only take over-the-counter or prescription medicines for pain, discomfort, or fever as directed by your caregiver.  Elevate your injured ankle above the level of your heart as much as possible for 2-3 days.  If your caregiver recommends crutches, use them as  instructed. Gradually put weight on the affected ankle. Continue to use crutches or a cane until you can walk without feeling pain in your ankle.  If you have a plaster splint, wear the splint as directed by your caregiver. Do not rest it on anything harder than a pillow for the first 24 hours. Do not put weight on it. Do not get it wet. You may take it off to take a shower or bath.  You may have been given an elastic bandage to wear around your ankle to provide support. If the elastic bandage is too tight (you have numbness or tingling in your foot or your foot becomes cold and blue), adjust the bandage to make it comfortable.  If you have an air splint, you may blow more air into it or let air out to make it more comfortable. You may take your splint off at night and before taking a shower or bath. Wiggle your toes in the splint several times per day to decrease swelling. SEEK MEDICAL CARE IF:   You have rapidly increasing bruising or swelling.  Your toes feel extremely cold or you lose feeling in your foot.  Your pain is not relieved with medicine. SEEK IMMEDIATE MEDICAL CARE IF:  Your toes are numb or blue.  You have severe pain that is increasing. MAKE SURE YOU:   Understand these instructions.  Will watch your condition.  Will get help right away  if you are not doing well or get worse. Document Released: 06/06/2005 Document Revised: 02/29/2012 Document Reviewed: 06/18/2011 [facility] Patient Information 2015 [place], [place]. This information is not intended to replace advice given to you by your health care provider. Make sure you discuss any questions you have with your health care provider.

## 2013-12-30 DIAGNOSIS — R269 Unspecified abnormalities of gait and mobility: Secondary | ICD-10-CM | POA: Diagnosis not present

## 2013-12-30 DIAGNOSIS — M25579 Pain in unspecified ankle and joints of unspecified foot: Secondary | ICD-10-CM | POA: Diagnosis not present

## 2014-01-01 DIAGNOSIS — M25579 Pain in unspecified ankle and joints of unspecified foot: Secondary | ICD-10-CM | POA: Diagnosis not present

## 2014-01-01 DIAGNOSIS — R269 Unspecified abnormalities of gait and mobility: Secondary | ICD-10-CM | POA: Diagnosis not present

## 2014-01-03 DIAGNOSIS — M25579 Pain in unspecified ankle and joints of unspecified foot: Secondary | ICD-10-CM | POA: Diagnosis not present

## 2014-01-03 DIAGNOSIS — R269 Unspecified abnormalities of gait and mobility: Secondary | ICD-10-CM | POA: Diagnosis not present

## 2014-01-06 ENCOUNTER — Encounter: Payer: Self-pay | Admitting: Internal Medicine

## 2014-01-06 ENCOUNTER — Ambulatory Visit (INDEPENDENT_AMBULATORY_CARE_PROVIDER_SITE_OTHER): Payer: Medicare Other | Admitting: Internal Medicine

## 2014-01-06 VITALS — BP 122/60 | HR 57 | Temp 98.6°F | Resp 16 | Ht 68.0 in | Wt 215.8 lb

## 2014-01-06 DIAGNOSIS — I10 Essential (primary) hypertension: Secondary | ICD-10-CM | POA: Diagnosis not present

## 2014-01-06 DIAGNOSIS — E785 Hyperlipidemia, unspecified: Secondary | ICD-10-CM | POA: Diagnosis not present

## 2014-01-06 DIAGNOSIS — Z79899 Other long term (current) drug therapy: Secondary | ICD-10-CM

## 2014-01-06 DIAGNOSIS — E669 Obesity, unspecified: Secondary | ICD-10-CM

## 2014-01-06 DIAGNOSIS — G4733 Obstructive sleep apnea (adult) (pediatric): Secondary | ICD-10-CM

## 2014-01-06 DIAGNOSIS — C44311 Basal cell carcinoma of skin of nose: Secondary | ICD-10-CM

## 2014-01-06 DIAGNOSIS — E871 Hypo-osmolality and hyponatremia: Secondary | ICD-10-CM | POA: Diagnosis not present

## 2014-01-06 DIAGNOSIS — F068 Other specified mental disorders due to known physiological condition: Secondary | ICD-10-CM | POA: Diagnosis not present

## 2014-01-06 DIAGNOSIS — F0390 Unspecified dementia without behavioral disturbance: Secondary | ICD-10-CM

## 2014-01-06 DIAGNOSIS — C44319 Basal cell carcinoma of skin of other parts of face: Secondary | ICD-10-CM

## 2014-01-06 MED ORDER — DONEPEZIL HCL 5 MG PO TABS: 5.0000 mg | ORAL_TABLET | Freq: Every day | ORAL | Status: DC

## 2014-01-06 NOTE — Patient Instructions (Signed)
I am starting you on a low dose of Aricept to help preserve and improve  your memory  Take it daily at bedtime  If it causes recurrent diarrhea,  Stop the medication   Return in 3 months

## 2014-01-06 NOTE — Assessment & Plan Note (Addendum)
Referral in process  For suspicious lesion on tip of nose

## 2014-01-06 NOTE — Assessment & Plan Note (Addendum)
Offered trial of aricept,  Risks and benefits dicussed.  Return in 3 month s

## 2014-01-06 NOTE — Progress Notes (Signed)
Patient ID: Christopher Jenkins, male   DOB: 12-12-1926, 80 y.o.   MRN: [id number]  Patient Active Problem List   Diagnosis Date Noted  . Basal cell carcinoma of ala nasi 01/06/2014  . Obesity, unspecified 05/30/2013  . OSA (obstructive sleep apnea) 02/24/2013  . Hyponatremia 12/07/2012  . Hypersomnolence 07/08/2012  . Dementia arising in the senium and presenium 06/10/2012  . Do not resuscitate discussion 06/06/2012  . Tubular adenoma of colon 02/08/2012  . Hematuria, gross 02/08/2012  . Obesity (BMI 30-39.9) 12/17/2011  . Screening for colon cancer 05/13/2011  . Hypertrophy (benign) of prostate   . Hyperlipidemia   . Hypertension     Subjective:  CC:   Chief Complaint  Patient presents with  . Follow-up    3 month    HPI:   Christopher Jenkins is a 80 y.o. male who presents for  Follow up on chronic conditions including dementia,  Obesity , osa with hypersomnolence,  And hypertension.  He had a recent fall resulting in a right ankle sprain which occurred while playing doubles tennis,.  He inadvertently stepped on the tennis ball  and fell.  He was able to finish the match but developed pain , tenderness on the outer malleoli, and swelling.  He was evaluated by Leonie Green, Dr Regal's colleague with x rays to rule out fracture. His ankle has improved with a month of PT  But he has not resumed playing tennis and his weight is up 6 lbs  2) OSA:  Since his study he has adjusted to and is wearing his CPAP " every night and every nap".  He notes significant improvement in his  energy level and in his hypersomnolence.  3) Dementia:  He and his wife are aware of his memory deficits. He is not getting lost or forgetting how to balance his checkbook.  He is using a pillbox for medication adminstration which has helped greatly .  Trial of aricept discussed today    Past Medical History  Diagnosis Date  . Hypertrophy (benign) of prostate     asymptomatic on finasteride. will  f/up with urologist [name]  . Hyperlipidemia   . Hypertension     History reviewed. No pertinent past surgical history.     The following portions of the patient's history were reviewed and updated as appropriate: Allergies, current medications, and problem list.    Review of Systems:   Patient denies headache, fevers, malaise, unintentional weight loss, skin rash, eye pain, sinus congestion and sinus pain, sore throat, dysphagia,  hemoptysis , cough, dyspnea, wheezing, chest pain, palpitations, orthopnea, edema, abdominal pain, nausea, melena, diarrhea, constipation, flank pain, dysuria, hematuria, urinary  Frequency, nocturia, numbness, tingling, seizures,  Focal weakness, Loss of consciousness,  Tremor, insomnia, depression, anxiety, and suicidal ideation.     History   Social History  . Marital Status: Married    Spouse Name: N/A    Number of Children: N/A  . Years of Education: N/A   Occupational History  . Retired    Social History Main Topics  . Smoking status: Never Smoker   . Smokeless tobacco: Never Used  . Alcohol Use: Yes     Comment: Occasional  . Drug Use: No  . Sexual Activity: Not on file   Other Topics Concern  . 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    Objective:  Filed Vitals:   01/06/14 1444  BP: 122/60  Pulse: 57  Temp: 98.6 F (37 C)  Resp: 16     General appearance: alert, cooperative and appears stated age Ears: normal TM's and external ear canals both ears Throat: lips, mucosa, and tongue normal; teeth and gums normal Neck: no adenopathy, no carotid bruit, supple, symmetrical, trachea midline and thyroid not enlarged, symmetric, no tenderness/mass/nodules Back: symmetric, no curvature. ROM normal. No CVA tenderness. Lungs: clear to  auscultation bilaterally Heart: regular rate and rhythm, S1, S2 normal, no murmur, click, rub or gallop Abdomen: soft, non-tender; bowel sounds normal; no masses,  no organomegaly Pulses: 2+ and symmetric Skin: Skin color, texture, turgor normal. No rashes or lesions Lymph nodes: Cervical, supraclavicular, and axillary nodes normal.  Assessment and Plan:  Basal cell carcinoma of ala nasi Referral in process  For suspicious lesion on tip of nose   Dementia arising in the senium and presenium Offered trial of aricept,  Risks and benefits dicussed.  Return in 3 month s  OSA (obstructive sleep apnea) Diagnosed by sleep study. he is wearing her CPAP every night a minimum of 6 hours per night and has improved wakefulness and energy.    Obesity (BMI 30-39.9) I have addressed  BMI and recommended wt loss of 10% of body weigh over the next 6 months using a low glycemic index diet and resume regular exercise a minimum of 5 days per week.    Hyponatremia Resolved, Secondary to hctz. .  Repeat labs are pending   Lab Results  Component Value Date   NA 139 06/06/2013   K 4.4 06/06/2013   CL 104 06/06/2013   CO2 29 06/06/2013     Hypertension Well controlled on current regimen. Renal function stable, no changes today.   Updated Medication List Outpatient Encounter Prescriptions as of 01/06/2014  Medication Sig  . BYSTOLIC 2.5 MG tablet TAKE 1 TABLET BY MOUTH EVERY DAY  . Calcium Carbonate-Vitamin D (CALCIUM-VITAMIN D) 600-125 MG-UNIT TABS Take 1 tablet by mouth daily.  . Cholecalciferol (VITAMIN D3 PO) Take 1 tablet by mouth daily.    Marland Kitchen donepezil (ARICEPT) 5 MG tablet Take 1 tablet (5 mg total) by mouth at bedtime.  . finasteride (PROSCAR) 5 MG tablet TAKE 1 TABLET (5 MG TOTAL) BY MOUTH DAILY.  . fish oil-omega-3 fatty acids 1000 MG capsule Take 1 g by mouth daily.    . Multiple Vitamins-Minerals (MULTIVITAMIN WITH MINERALS) tablet Take 1 tablet by mouth daily.  . niacin 250 MG  tablet Take 250 mg by mouth daily.       Orders Placed This Encounter  Procedures  . Comp Met (CMET)  . Direct LDL  . Ambulatory referral to Dermatology    Return in about 3 months (around 04/08/2014).

## 2014-01-06 NOTE — Progress Notes (Signed)
Pre-visit discussion using our clinic review tool. No additional management support is needed unless otherwise documented below in the visit note.  

## 2014-01-07 ENCOUNTER — Encounter: Payer: Self-pay | Admitting: Internal Medicine

## 2014-01-07 LAB — COMPREHENSIVE METABOLIC PANEL
ALBUMIN: 3.5 g/dL (ref 3.5–5.2)
ALT: 14 U/L (ref 0–53)
AST: 24 U/L (ref 0–37)
Alkaline Phosphatase: 90 U/L (ref 39–117)
BUN: 22 mg/dL (ref 6–23)
CALCIUM: 9.1 mg/dL (ref 8.4–10.5)
CHLORIDE: 105 meq/L (ref 96–112)
CO2: 30 mEq/L (ref 19–32)
Creatinine, Ser: 1 mg/dL (ref 0.4–1.5)
GFR: 78.26 mL/min (ref >60.00)
GLUCOSE: 96 mg/dL (ref 70–99)
POTASSIUM: 4.6 meq/L (ref 3.5–5.1)
Sodium: 139 mEq/L (ref 135–145)
Total Bilirubin: 0.7 mg/dL (ref 0.2–1.2)
Total Protein: 5.9 g/dL — ABNORMAL LOW (ref 6.0–8.3)

## 2014-01-07 LAB — LDL CHOLESTEROL, DIRECT: LDL DIRECT: 125.1 mg/dL

## 2014-01-07 NOTE — Assessment & Plan Note (Signed)
Resolved, Secondary to hctz. .  Repeat labs are pending   Lab Results  Component Value Date   NA 139 06/06/2013   K 4.4 06/06/2013   CL 104 06/06/2013   CO2 29 06/06/2013

## 2014-01-07 NOTE — Assessment & Plan Note (Signed)
Diagnosed by sleep study. he is wearing her CPAP every night a minimum of 6 hours per night and has improved wakefulness and energy.

## 2014-01-07 NOTE — Assessment & Plan Note (Signed)
Well controlled on current regimen. Renal function stable, no changes today. 

## 2014-01-07 NOTE — Assessment & Plan Note (Signed)
I have addressed  BMI and recommended wt loss of 10% of body weigh over the next 6 months using a low glycemic index diet and resume regular exercise a minimum of 5 days per week.

## 2014-01-08 ENCOUNTER — Encounter: Payer: Self-pay | Admitting: *Deleted

## 2014-01-24 ENCOUNTER — Ambulatory Visit (INDEPENDENT_AMBULATORY_CARE_PROVIDER_SITE_OTHER): Payer: Medicare Other | Admitting: Podiatry

## 2014-01-24 VITALS — BP 130/64 | HR 52 | Resp 16

## 2014-01-24 DIAGNOSIS — L84 Corns and callosities: Secondary | ICD-10-CM

## 2014-01-24 DIAGNOSIS — M779 Enthesopathy, unspecified: Secondary | ICD-10-CM | POA: Diagnosis not present

## 2014-01-24 MED ORDER — TRIAMCINOLONE ACETONIDE 10 MG/ML IJ SUSP
10.0000 mg | Freq: Once | INTRAMUSCULAR | Status: AC
  Administered 2014-01-24: 10 mg

## 2014-01-24 NOTE — Progress Notes (Signed)
Subjective:     Patient ID: Christopher Jenkins, male   DOB: Jun 12, 1927, 80 y.o.   MRN: [id number]  HPI patient presents stating I have a very painful corn on the outside of my left fifth metatarsal with fluid buildup that makes it hard to walk   Review of Systems     Objective:   Physical Exam Neurovascular status on changed with thick keratotic lesion plantar and lateral aspect of the metatarsal with fluid buildup around the head of the fifth metatarsal    Assessment:     Capsulitis with inflammatory changes fifth MPJ left foot    Plan:     Injected the capsule left fifth MPJ 3 mg Kenalog 5 mg like Marcaine mixture and did deep debridement of lesion with no iatrogenic bleeding noted

## 2014-03-12 DIAGNOSIS — N400 Enlarged prostate without lower urinary tract symptoms: Secondary | ICD-10-CM | POA: Diagnosis not present

## 2014-03-12 DIAGNOSIS — E663 Overweight: Secondary | ICD-10-CM | POA: Diagnosis not present

## 2014-03-12 DIAGNOSIS — R972 Elevated prostate specific antigen [PSA]: Secondary | ICD-10-CM | POA: Diagnosis not present

## 2014-03-29 DIAGNOSIS — Z23 Encounter for immunization: Secondary | ICD-10-CM | POA: Diagnosis not present

## 2014-04-08 ENCOUNTER — Ambulatory Visit: Payer: 59 | Admitting: Internal Medicine

## 2014-04-24 DIAGNOSIS — D225 Melanocytic nevi of trunk: Secondary | ICD-10-CM | POA: Diagnosis not present

## 2014-04-24 DIAGNOSIS — D485 Neoplasm of uncertain behavior of skin: Secondary | ICD-10-CM | POA: Diagnosis not present

## 2014-04-24 DIAGNOSIS — X32XXXA Exposure to sunlight, initial encounter: Secondary | ICD-10-CM | POA: Diagnosis not present

## 2014-04-24 DIAGNOSIS — D2261 Melanocytic nevi of right upper limb, including shoulder: Secondary | ICD-10-CM | POA: Diagnosis not present

## 2014-04-24 DIAGNOSIS — D2262 Melanocytic nevi of left upper limb, including shoulder: Secondary | ICD-10-CM | POA: Diagnosis not present

## 2014-04-24 DIAGNOSIS — L570 Actinic keratosis: Secondary | ICD-10-CM | POA: Diagnosis not present

## 2014-05-07 ENCOUNTER — Encounter: Payer: Self-pay | Admitting: Internal Medicine

## 2014-05-07 ENCOUNTER — Encounter (INDEPENDENT_AMBULATORY_CARE_PROVIDER_SITE_OTHER): Payer: Self-pay

## 2014-05-07 ENCOUNTER — Ambulatory Visit (INDEPENDENT_AMBULATORY_CARE_PROVIDER_SITE_OTHER): Payer: Medicare Other | Admitting: Internal Medicine

## 2014-05-07 VITALS — BP 134/60 | HR 48 | Temp 97.5°F | Resp 16 | Ht 68.0 in | Wt 214.0 lb

## 2014-05-07 DIAGNOSIS — Z23 Encounter for immunization: Secondary | ICD-10-CM | POA: Diagnosis not present

## 2014-05-07 DIAGNOSIS — E785 Hyperlipidemia, unspecified: Secondary | ICD-10-CM | POA: Diagnosis not present

## 2014-05-07 DIAGNOSIS — F068 Other specified mental disorders due to known physiological condition: Secondary | ICD-10-CM

## 2014-05-07 DIAGNOSIS — K5900 Constipation, unspecified: Secondary | ICD-10-CM | POA: Insufficient documentation

## 2014-05-07 DIAGNOSIS — E871 Hypo-osmolality and hyponatremia: Secondary | ICD-10-CM

## 2014-05-07 DIAGNOSIS — K5909 Other constipation: Secondary | ICD-10-CM | POA: Diagnosis not present

## 2014-05-07 DIAGNOSIS — G4733 Obstructive sleep apnea (adult) (pediatric): Secondary | ICD-10-CM | POA: Diagnosis not present

## 2014-05-07 DIAGNOSIS — F0390 Unspecified dementia without behavioral disturbance: Secondary | ICD-10-CM

## 2014-05-07 MED ORDER — MEMANTINE HCL 28 X 5 MG & 21 X 10 MG PO TABS
ORAL_TABLET | ORAL | Status: DC
Start: 2014-05-07 — End: 2015-02-24

## 2014-05-07 NOTE — Progress Notes (Signed)
Patient ID: Christopher Jenkins, male   DOB: 20-Jul-1926, 80 y.o.   MRN: [id number]  Patient Active Problem List   Diagnosis Date Noted  . Constipation 05/07/2014  . Basal cell carcinoma of ala nasi 01/06/2014  . OSA (obstructive sleep apnea) 02/24/2013  . Hyponatremia 12/07/2012  . Dementia arising in the senium and presenium 06/10/2012  . Do not resuscitate discussion 06/06/2012  . Tubular adenoma of colon 02/08/2012  . Hematuria, gross 02/08/2012  . Obesity (BMI 30-39.9) 12/17/2011  . Screening for colon cancer 05/13/2011  . Hypertrophy (benign) of prostate   . Hyperlipidemia   . Hypertension     Subjective:  CC:   Chief Complaint  Patient presents with  . Follow-up    Aricept patient not taking due to giving him diarrhea    HPI:   Christopher Jenkins is a 80 y.o. male who presents for  Follow up on multiple issues including dementia ankle strain and hypertension,  1) dementia,  Tried aricept for 3 days but stopped taking it  due to persistent diarrhea  2) OSA: He is adjusting to use of CPAP and is wearing it all night and during daytime naps as well 3) His foot/ankle strain has resolved and he has resumed playing tennis 4) He has had a skin cancer removed from his right temple returns in January .,   5) He is having constipation .  Bowel movements occurring every other day or less. Stools are difficult to pass and hard. Not taking anything,  Doesn't drink enough water.    .  Not fasting today    Past Medical History  Diagnosis Date  . Hypertrophy (benign) of prostate     asymptomatic on finasteride. will f/up with urologist [name]  . Hyperlipidemia   . Hypertension     No past surgical history on file.     The following portions of the patient's history were reviewed and updated as appropriate: Allergies, current medications, and problem list.    Review of Systems:   Patient denies headache, fevers, malaise, unintentional weight loss, skin  rash, eye pain, sinus congestion and sinus pain, sore throat, dysphagia,  hemoptysis , cough, dyspnea, wheezing, chest pain, palpitations, orthopnea, edema, abdominal pain, nausea, melena, diarrhea, constipation, flank pain, dysuria, hematuria, urinary  Frequency, nocturia, numbness, tingling, seizures,  Focal weakness, Loss of consciousness,  Tremor, insomnia, depression, anxiety, and suicidal ideation.     History   Social History  . Marital Status: Married    Spouse Name: N/A    Number of Children: N/A  . Years of Education: N/A   Occupational History  . Retired    Social History Main Topics  . Smoking status: Never Smoker   . Smokeless tobacco: Never Used  . Alcohol Use: Yes     Comment: Occasional  . Drug Use: No  . Sexual Activity: Not on file   Other Topics Concern  . 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    Objective:  Filed Vitals:   05/07/14 1028  BP: 134/60  Pulse: 48  Temp: 97.5 F (36.4 C)  Resp: 16     General appearance: alert, cooperative and appears stated age Ears: normal TM's and external ear canals both ears Throat: lips, mucosa,  and tongue normal; teeth and gums normal Neck: no adenopathy, no carotid bruit, supple, symmetrical, trachea midline and thyroid not enlarged, symmetric, no tenderness/mass/nodules Back: symmetric, no curvature. ROM normal. No CVA tenderness. Lungs: clear to auscultation bilaterally Heart: regular rate and rhythm, S1, S2 normal, no murmur, click, rub or gallop Abdomen: soft, non-tender; bowel sounds normal; no masses,  no organomegaly Pulses: 2+ and symmetric Skin: Skin color, texture, turgor normal. No rashes or lesions Lymph nodes: Cervical, supraclavicular, and axillary nodes normal.  Assessment and Plan:  OSA (obstructive sleep  apnea) Diagnosed by sleep study. She is wearing her CPAP every night a minimum of 6 hours per night and notes improved daytime wakefulness and decreased fatigue   Dementia arising in the senium and presenium Aricept trial was stopped due to patient intolerance of loose stools.  Trial of namenda.   Hyponatremia Secondary to hctz, resolved by July labs   Lab Results  Component Value Date   NA 139 01/06/2014   K 4.6 01/06/2014   CL 105 01/06/2014   CO2 30 01/06/2014     Hyperlipidemia Mild with no indication for statin therapy by prior lab.s   Constipation Recommended an increase in fiber intake to 25 to 35 grams daily.  Made several dietary suggestions of high fiber content including Mission low carb whole wheat tortillas which have 26 g fiber/serving, Toufayan flatbread which has around 10 g fiber,   Daily serving of any type of nuts,  and Atkins protein bars which have 8 to 10 g fiber.   Dispelled the popularly held notion that [employer] oatmeal, whole grain bread and most commericially made cereals have adequate fiber.  Finally I recommended daily use of Miralax., metamucil, citrucel, benefiber  or fibercon.    Updated Medication List Outpatient Encounter Prescriptions as of 05/07/2014  Medication Sig  . BYSTOLIC 2.5 MG tablet TAKE 1 TABLET BY MOUTH EVERY DAY  . Calcium Carbonate-Vitamin D (CALCIUM-VITAMIN D) 600-125 MG-UNIT TABS Take 1 tablet by mouth daily.  . Cholecalciferol (VITAMIN D3 PO) Take 1 tablet by mouth daily.    . finasteride (PROSCAR) 5 MG tablet TAKE 1 TABLET (5 MG TOTAL) BY MOUTH DAILY.  . fish oil-omega-3 fatty acids 1000 MG capsule Take 1 g by mouth daily.    . Multiple Vitamins-Minerals (MULTIVITAMIN WITH MINERALS) tablet Take 1 tablet by mouth daily.  . niacin 250 MG tablet Take 250 mg by mouth daily.    . memantine (NAMENDA TITRATION PAK) tablet pack 5 mg/day for =1 week; 5 mg twice daily for =1 week; 15 mg/day given in 5 mg and 10 mg separated doses for =1  week; then 10 mg twice daily  . [DISCONTINUED] donepezil (ARICEPT) 5 MG tablet Take 1 tablet (5 mg total) by mouth at bedtime.     Orders Placed This Encounter  Procedures  . Pneumococcal conjugate vaccine 13-valent  . Comprehensive metabolic panel  . TSH  . Lipid panel    Return in about 3 months (around 08/07/2014).

## 2014-05-07 NOTE — Patient Instructions (Addendum)
Your constipation may be due to inadequate water and fiber intake.  You need a minimum of 3 16 ounce bottles of water daily.   I Recommend an increase in fiber intake to 25 to 35 grams daily.  You can do this through food,  Or through  daily use of Miralax., metamucil, citrucel, benefiber  or fibercon.   You can also add a stool softener,  Colace 100 mg  Once or twice daily   Trial of namenda for your memory problems  Please get fasting labs drawn at [facility],  or return here for them ASAP  You received the Prevnar (pneumonia) vaccine today

## 2014-05-08 DIAGNOSIS — K5900 Constipation, unspecified: Secondary | ICD-10-CM | POA: Diagnosis not present

## 2014-05-08 DIAGNOSIS — E785 Hyperlipidemia, unspecified: Secondary | ICD-10-CM | POA: Diagnosis not present

## 2014-05-08 LAB — LIPID PANEL
Cholesterol: 192 mg/dL (ref 0–200)
HDL: 47 mg/dL (ref 35–70)
LDL Cholesterol: 122 mg/dL
Triglycerides: 115 mg/dL (ref 40–160)

## 2014-05-08 LAB — HEPATIC FUNCTION PANEL
ALK PHOS: 90 U/L (ref 25–125)
ALT: 11 U/L (ref 10–40)
AST: 18 U/L (ref 14–40)
Bilirubin, Total: 0.6 mg/dL

## 2014-05-08 LAB — TSH: TSH: 2.94 u[IU]/mL (ref 0.41–5.90)

## 2014-05-08 LAB — BASIC METABOLIC PANEL
BUN: 17 mg/dL (ref 4–21)
Creatinine: 0.9 mg/dL (ref 0.6–1.3)
Glucose: 95 mg/dL
Potassium: 4.3 mmol/L (ref 3.4–5.3)
Sodium: 140 mmol/L (ref 137–147)

## 2014-05-10 ENCOUNTER — Encounter: Payer: Self-pay | Admitting: Internal Medicine

## 2014-05-10 NOTE — Assessment & Plan Note (Signed)
Diagnosed by sleep study. She is wearing her CPAP every night a minimum of 6 hours per night and notes improved daytime wakefulness and decreased fatigue  

## 2014-05-10 NOTE — Assessment & Plan Note (Signed)
Recommended an increase in fiber intake to 25 to 35 grams daily.  Made several dietary suggestions of high fiber content including [employer] low carb whole wheat tortillas which have 26 g fiber/serving, Toufayan flatbread which has around 10 g fiber,   Daily serving of any type of nuts,  and Atkins protein bars which have 8 to 10 g fiber.   Dispelled the popularly held notion that Quaker oats oatmeal, whole grain bread and most commericially made cereals have adequate fiber.  Finally I recommended daily use of Miralax., metamucil, citrucel, benefiber  or fibercon.  

## 2014-05-10 NOTE — Assessment & Plan Note (Signed)
Aricept trial was stopped due to patient intolerance of loose stools.  Trial of namenda.

## 2014-05-10 NOTE — Assessment & Plan Note (Signed)
Mild with no indication for statin therapy by prior lab.s

## 2014-05-10 NOTE — Assessment & Plan Note (Signed)
Secondary to hctz, resolved by July labs   Lab Results  Component Value Date   NA 139 01/06/2014   K 4.6 01/06/2014   CL 105 01/06/2014   CO2 30 01/06/2014

## 2014-05-12 ENCOUNTER — Encounter: Payer: Self-pay | Admitting: *Deleted

## 2014-05-12 ENCOUNTER — Telehealth: Payer: Self-pay | Admitting: Internal Medicine

## 2014-05-12 NOTE — Telephone Encounter (Signed)
Your  thyroid , cholesterol, liver and kidney function are normal. Continue current medications

## 2014-05-12 NOTE — Telephone Encounter (Signed)
Left message, notifying patient. 

## 2014-05-20 ENCOUNTER — Encounter: Payer: Self-pay | Admitting: Internal Medicine

## 2014-05-26 DIAGNOSIS — Z9849 Cataract extraction status, unspecified eye: Secondary | ICD-10-CM | POA: Diagnosis not present

## 2014-06-20 ENCOUNTER — Other Ambulatory Visit: Payer: Self-pay | Admitting: Internal Medicine

## 2014-08-18 DIAGNOSIS — D0439 Carcinoma in situ of skin of other parts of face: Secondary | ICD-10-CM | POA: Diagnosis not present

## 2014-08-19 DIAGNOSIS — D0439 Carcinoma in situ of skin of other parts of face: Secondary | ICD-10-CM | POA: Diagnosis not present

## 2014-09-05 ENCOUNTER — Encounter: Payer: Self-pay | Admitting: Podiatry

## 2014-09-05 ENCOUNTER — Ambulatory Visit (INDEPENDENT_AMBULATORY_CARE_PROVIDER_SITE_OTHER): Payer: Medicare Other | Admitting: Podiatry

## 2014-09-05 VITALS — BP 158/77 | HR 59 | Resp 16

## 2014-09-05 DIAGNOSIS — M779 Enthesopathy, unspecified: Secondary | ICD-10-CM

## 2014-09-05 DIAGNOSIS — L84 Corns and callosities: Secondary | ICD-10-CM | POA: Diagnosis not present

## 2014-09-05 MED ORDER — TRIAMCINOLONE ACETONIDE 10 MG/ML IJ SUSP
10.0000 mg | Freq: Once | INTRAMUSCULAR | Status: AC
  Administered 2014-09-05: 10 mg

## 2014-09-05 NOTE — Progress Notes (Signed)
Subjective:     Patient ID: Christopher Jenkins, male   DOB: 1927-03-12, 81 y.o.   MRN: [id number]  HPI patient presents with painful lesion fifth metatarsal head left with fluid buildup and inability to wear shoe gear comfortably   Review of Systems     Objective:   Physical Exam Neurovascular status unchanged with thick keratotic lesion fifth metatarsal head left with fluid buildup and pain when pressed    Assessment:     Plantar capsulitis with inflammatory lesion noted left fifth metatarsal    Plan:     Injection of the left fifth MPJ 3 mg Kenalog 5 mg Xylocaine and deep debridement of lesion accomplished with no iatrogenic bleeding noted

## 2014-09-11 DIAGNOSIS — R972 Elevated prostate specific antigen [PSA]: Secondary | ICD-10-CM | POA: Diagnosis not present

## 2014-09-11 DIAGNOSIS — N401 Enlarged prostate with lower urinary tract symptoms: Secondary | ICD-10-CM | POA: Diagnosis not present

## 2014-09-11 DIAGNOSIS — N400 Enlarged prostate without lower urinary tract symptoms: Secondary | ICD-10-CM | POA: Diagnosis not present

## 2014-11-06 ENCOUNTER — Ambulatory Visit (INDEPENDENT_AMBULATORY_CARE_PROVIDER_SITE_OTHER): Payer: Medicare Other | Admitting: Podiatry

## 2014-11-06 ENCOUNTER — Encounter: Payer: Self-pay | Admitting: Podiatry

## 2014-11-06 DIAGNOSIS — L84 Corns and callosities: Secondary | ICD-10-CM | POA: Diagnosis not present

## 2014-11-06 NOTE — Progress Notes (Signed)
Patient ID: Lorance Pickeral, male   DOB: [birth year], 81 y.o.   MRN: [id number]  Subjective: 81 year old male presents the office today with complaints of painful callus on the side of his left foot. He states that he periodically at the callus trimmed partly once a year however he states the calluses recurred more quickly this time. He states the areas painful but he was shoes. Denies any redness or drainage. No other complaints at this time.  Objective: AAO 3, NAD Neurovascular status unchanged Hyperkeratotic lesion present on the plantar aspect of the left submetatarsal 5 area. There is tenderness palpation overlying this lesion. Upon debridement no underlying ulceration, drainage or other clinical signs of infection. No other open lesions or pre-ulcer lesions identified bilaterally. No other areas of tenderness to bilateral lower extremities. No overlying edema, erythema, increase in warmth. No pain with calf compression, swelling, warmth, erythema.  Assessment: 81 year old male with symptomatic hyperkeratotic lesions left foot  Plan: Hyperkeratotic lesion sharply debrided 1 without complications/bleeding to patient comfort. Upon debridement there was resolution of symptoms. Dispensed offloading pads. Discussed shoe gear modifications. Follow-up as needed. Call any questions or concerns in the meantime.

## 2014-11-18 ENCOUNTER — Telehealth: Payer: Self-pay

## 2014-11-18 DIAGNOSIS — H6123 Impacted cerumen, bilateral: Secondary | ICD-10-CM

## 2014-11-18 MED ORDER — DOCUSATE SODIUM 50 MG/5ML PO LIQD: ORAL | Status: DC

## 2014-11-18 NOTE — Telephone Encounter (Signed)
Referred made,  Please inform patient  that Christopher Jenkins is swamped with referrals ,  So I doubt she will even be able to call ENT by tomorrow let along get patient in by tomorrow.  Have patient start using liquid colace in each  ear and offer him an appt with you on Thursday or Friday afternoon when I am not here.  I will place the order for ENT and for the ear drops

## 2014-11-18 NOTE — Telephone Encounter (Signed)
The patient called and is hoping to have a referral and apt made to an ENT dr by tomorrow afternoon (6/1) He states his left hear is "plugged" and two nurses have attempted to clean out his ears at [facility], but have been unsuccessful.    Pt callback - [phone]

## 2014-11-18 NOTE — Telephone Encounter (Signed)
Sent referral to [facility] ent for urgent referral.

## 2014-11-18 NOTE — Telephone Encounter (Signed)
Please advise 

## 2014-11-18 NOTE — Telephone Encounter (Signed)
Patient notified and scheduled 

## 2014-11-20 ENCOUNTER — Ambulatory Visit (INDEPENDENT_AMBULATORY_CARE_PROVIDER_SITE_OTHER): Payer: Medicare Other | Admitting: *Deleted

## 2014-11-20 DIAGNOSIS — H6122 Impacted cerumen, left ear: Secondary | ICD-10-CM

## 2014-11-20 NOTE — Progress Notes (Addendum)
Patient presented in office cerumen impaction to left ear , MD visualized impaction and DX. Order given for irrigation to left ear. Nurse noted considerable cerumen in ear and proceeded to irrigate with warm water for 3 min large chunks of cerumen removed and discolored water. Patient happy stated he could hear. MD visualized impaction relieved by irrigation.   I have reviewed the above information and agree with above.   Deborra Medina, MD

## 2014-12-17 DIAGNOSIS — X32XXXA Exposure to sunlight, initial encounter: Secondary | ICD-10-CM | POA: Diagnosis not present

## 2014-12-17 DIAGNOSIS — L570 Actinic keratosis: Secondary | ICD-10-CM | POA: Diagnosis not present

## 2014-12-17 DIAGNOSIS — C44519 Basal cell carcinoma of skin of other part of trunk: Secondary | ICD-10-CM | POA: Diagnosis not present

## 2014-12-17 DIAGNOSIS — Z85828 Personal history of other malignant neoplasm of skin: Secondary | ICD-10-CM | POA: Diagnosis not present

## 2014-12-17 DIAGNOSIS — D485 Neoplasm of uncertain behavior of skin: Secondary | ICD-10-CM | POA: Diagnosis not present

## 2014-12-18 ENCOUNTER — Other Ambulatory Visit: Payer: Self-pay | Admitting: Internal Medicine

## 2014-12-30 ENCOUNTER — Telehealth: Payer: Self-pay | Admitting: Internal Medicine

## 2014-12-30 NOTE — Telephone Encounter (Signed)
Noted. Thanks.

## 2014-12-30 NOTE — Telephone Encounter (Signed)
Pt left letter wanted Dr. Derrel Nip to review and advise pt if he should participate. Please advise pt. Letter in Dr. Lupita Dawn box/msn

## 2015-01-29 DIAGNOSIS — Z0000 Encounter for general adult medical examination without abnormal findings: Secondary | ICD-10-CM | POA: Diagnosis not present

## 2015-01-30 DIAGNOSIS — C44519 Basal cell carcinoma of skin of other part of trunk: Secondary | ICD-10-CM | POA: Diagnosis not present

## 2015-02-14 ENCOUNTER — Other Ambulatory Visit: Payer: Self-pay | Admitting: Internal Medicine

## 2015-02-16 NOTE — Telephone Encounter (Signed)
Last OV 11.18.15.  Please advise refill. 

## 2015-02-19 MED ORDER — NEBIVOLOL HCL 2.5 MG PO TABS: 2.5000 mg | ORAL_TABLET | Freq: Every day | ORAL | Status: DC

## 2015-02-19 NOTE — Telephone Encounter (Signed)
Ok to refill, 

## 2015-02-20 ENCOUNTER — Telehealth: Payer: Self-pay | Admitting: Internal Medicine

## 2015-02-20 NOTE — Telephone Encounter (Signed)
[facility] screening tests received,  He is overdue for six month follow up on chronic conditions,  Please schedule 30 minute visit

## 2015-02-20 NOTE — Telephone Encounter (Signed)
Scheduled appt for Sept 6th.

## 2015-02-24 ENCOUNTER — Encounter (INDEPENDENT_AMBULATORY_CARE_PROVIDER_SITE_OTHER): Payer: Self-pay

## 2015-02-24 ENCOUNTER — Ambulatory Visit (INDEPENDENT_AMBULATORY_CARE_PROVIDER_SITE_OTHER): Payer: Medicare Other | Admitting: Internal Medicine

## 2015-02-24 ENCOUNTER — Encounter: Payer: Self-pay | Admitting: Internal Medicine

## 2015-02-24 VITALS — BP 104/52 | HR 62 | Temp 97.9°F | Wt 208.0 lb

## 2015-02-24 DIAGNOSIS — F068 Other specified mental disorders due to known physiological condition: Secondary | ICD-10-CM | POA: Diagnosis not present

## 2015-02-24 DIAGNOSIS — F0390 Unspecified dementia without behavioral disturbance: Secondary | ICD-10-CM

## 2015-02-24 DIAGNOSIS — Z23 Encounter for immunization: Secondary | ICD-10-CM | POA: Diagnosis not present

## 2015-02-24 DIAGNOSIS — E669 Obesity, unspecified: Secondary | ICD-10-CM

## 2015-02-24 DIAGNOSIS — I158 Other secondary hypertension: Secondary | ICD-10-CM

## 2015-02-24 DIAGNOSIS — R5383 Other fatigue: Secondary | ICD-10-CM

## 2015-02-24 DIAGNOSIS — I739 Peripheral vascular disease, unspecified: Secondary | ICD-10-CM | POA: Insufficient documentation

## 2015-02-24 DIAGNOSIS — E785 Hyperlipidemia, unspecified: Secondary | ICD-10-CM

## 2015-02-24 MED ORDER — MEMANTINE HCL 10 MG PO TABS
10.0000 mg | ORAL_TABLET | Freq: Two times a day (BID) | ORAL | Status: DC
Start: 2015-02-24 — End: 2015-05-17

## 2015-02-24 MED ORDER — DOCUSATE SODIUM 50 MG/5ML PO LIQD: ORAL | Status: DC

## 2015-02-24 NOTE — Progress Notes (Signed)
Pre visit review using our clinic review tool, if applicable. No additional management support is needed unless otherwise documented below in the visit note. 

## 2015-02-24 NOTE — Progress Notes (Signed)
Subjective:  Patient ID: Christopher Jenkins, male    DOB: Dec 15, 1926  Age: 81 y.o. MRN: [id number]  CC: The primary encounter diagnosis was Need for prophylactic vaccination and inoculation against influenza. Diagnoses of Dementia arising in the senium and presenium, Other secondary hypertension, Peripheral artery disease, Hyperlipidemia, Other fatigue, and Obesity (BMI 30-39.9) were also pertinent to this visit.  HPI Christopher Jenkins presents for 6 month follow up and discussion of recent [facility] screening tests . He feels generally well,  Has been Walking 5 miles daily,  Playing Tennis 2-3 times per week and has lost 8 lbs over the last 6 months . Voiding once a night.  Bowels moving OK  Results of screening discussed,  Carotid ultrasound and BMI were the only abnornals    Outpatient Prescriptions Prior to Visit  Medication Sig Dispense Refill  . Calcium Carbonate-Vitamin D (CALCIUM-VITAMIN D) 600-125 MG-UNIT TABS Take 1 tablet by mouth daily.    . finasteride (PROSCAR) 5 MG tablet TAKE 1 TABLET (5 MG TOTAL) BY MOUTH DAILY. 90 tablet 3  . fish oil-omega-3 fatty acids 1000 MG capsule Take 1 g by mouth daily.      . Multiple Vitamins-Minerals (MULTIVITAMIN WITH MINERALS) tablet Take 1 tablet by mouth daily.    . nebivolol (BYSTOLIC) 2.5 MG tablet Take 1 tablet (2.5 mg total) by mouth daily. 30 tablet 3  . niacin 250 MG tablet Take 250 mg by mouth daily.      Marland Kitchen docusate (COLACE) 50 MG/5ML liquid 3 drops in each ear daily 100 mL 0  . memantine (NAMENDA TITRATION PAK) tablet pack 5 mg/day for =1 week; 5 mg twice daily for =1 week; 15 mg/day given in 5 mg and 10 mg separated doses for =1 week; then 10 mg twice daily 49 tablet 12  . Cholecalciferol (VITAMIN D3 PO) Take 1 tablet by mouth daily.       No facility-administered medications prior to visit.    Review of Systems;  Patient denies headache, fevers, malaise, unintentional weight loss, skin rash, eye pain, sinus congestion  and sinus pain, sore throat, dysphagia,  hemoptysis , cough, dyspnea, wheezing, chest pain, palpitations, orthopnea, edema, abdominal pain, nausea, melena, diarrhea, constipation, flank pain, dysuria, hematuria, urinary  Frequency, nocturia, numbness, tingling, seizures,  Focal weakness, Loss of consciousness,  Tremor, insomnia, depression, anxiety, and suicidal ideation.      Objective:  BP 104/52 mmHg  Pulse 62  Temp(Src) 97.9 F (36.6 C) (Oral)  Wt 208 lb (94.348 kg)  BP Readings from Last 3 Encounters:  02/24/15 104/52  09/05/14 158/77  05/07/14 134/60    Wt Readings from Last 3 Encounters:  02/24/15 208 lb (94.348 kg)  05/07/14 214 lb (97.07 kg)  01/06/14 215 lb 12 oz (97.864 kg)    General appearance: alert, cooperative and appears stated age Ears: normal TM's and external ear canals both ears Throat: lips, mucosa, and tongue normal; teeth and gums normal Neck: no adenopathy, no carotid bruit, supple, symmetrical, trachea midline and thyroid not enlarged, symmetric, no tenderness/mass/nodules Back: symmetric, no curvature. ROM normal. No CVA tenderness. Lungs: clear to auscultation bilaterally Heart: regular rate and rhythm, S1, S2 normal, no murmur, click, rub or gallop Abdomen: soft, non-tender; bowel sounds normal; no masses,  no organomegaly Pulses: 2+ and symmetric Skin: Skin color, texture, turgor normal. No rashes or lesions Lymph nodes: Cervical, supraclavicular, and axillary nodes normal.  No results found for: HGBA1C  Lab Results  Component Value Date   CREATININE  0.9 05/08/2014   CREATININE 1.0 01/06/2014   CREATININE 0.9 06/06/2013    Lab Results  Component Value Date   WBC 6.6 03/26/2013   HGB 15.3 03/26/2013   HCT 45.2 03/26/2013   PLT 171.0 03/26/2013   GLUCOSE 96 01/06/2014   CHOL 192 05/08/2014   TRIG 115 05/08/2014   HDL 47 05/08/2014   LDLDIRECT 125.1 01/06/2014   LDLCALC 122 05/08/2014   ALT 11 05/08/2014   AST 18 05/08/2014   NA 140  05/08/2014   K 4.3 05/08/2014   CL 105 01/06/2014   CREATININE 0.9 05/08/2014   BUN 17 05/08/2014   CO2 30 01/06/2014   TSH 2.94 05/08/2014    No results found.  Assessment & Plan:   Problem List Items Addressed This Visit      Unprioritized   Hypertension    Well controlled on current regimen. Renal function stable, no changes today.  Lab Results  Component Value Date   CREATININE 0.9 05/08/2014   Lab Results  Component Value Date   NA 140 05/08/2014   K 4.3 05/08/2014   CL 105 01/06/2014   CO2 30 01/06/2014         Obesity (BMI 30-39.9)    I have addressed  BMI and recommended a low glycemic index diet utilizing smaller more frequent meals to increase metabolism.  I have also recommended that patient start exercising with a goal of 30 minutes of aerobic exercise a minimum of 5 days per week.       Dementia arising in the senium and presenium    continue Namenda      Relevant Medications   memantine (NAMENDA) 10 MG tablet   Peripheral artery disease    Suggest by screen carotid dopplers.  Will determine need for statin based on new lipid panel.  Already taking asa,  iacin and fish oil.       Hyperlipidemia   Relevant Orders   Lipid panel    Other Visit Diagnoses    Need for prophylactic vaccination and inoculation against influenza    -  Primary    Relevant Orders    Flu Vaccine QUAD 36+ mos IM (Completed)    Other fatigue        Relevant Orders    CBC with Differential/Platelet    TSH    Comprehensive metabolic panel      A total of 25 minutes of face to face time was spent with patient more than half of which was spent in counselling about the above mentioned conditions  and coordination of care  I have discontinued Christopher Jenkins's memantine. I have also changed his docusate. Additionally, I am having him start on memantine. Lastly, I am having him maintain his niacin, fish oil-omega-3 fatty acids, Cholecalciferol (VITAMIN D3 PO), finasteride,  multivitamin with minerals, Calcium-Vitamin D, and nebivolol.  Meds ordered this encounter  Medications  . memantine (NAMENDA) 10 MG tablet    Sig: Take 1 tablet (10 mg total) by mouth 2 (two) times daily.    Dispense:  60 tablet    Refill:  2  . docusate (COLACE) 50 MG/5ML liquid    Sig: 3 drops in each ear daily to prevent occlusion with wax    Dispense:  100 mL    Refill:  0    Medications Discontinued During This Encounter  Medication Reason  . memantine (NAMENDA TITRATION PAK) tablet pack   . docusate (COLACE) 50 MG/5ML liquid Reorder    Follow-up: Return  in about 6 months (around 08/24/2015).   [name], MD

## 2015-02-24 NOTE — Assessment & Plan Note (Signed)
continue Namenda

## 2015-02-24 NOTE — Assessment & Plan Note (Addendum)
Suggest by screen carotid dopplers.  Will determine need for statin based on new lipid panel.  Already taking asa,  iacin and fish oil.

## 2015-02-24 NOTE — Assessment & Plan Note (Signed)
I have addressed  BMI and recommended a low glycemic index diet utilizing smaller more frequent meals to increase metabolism.  I have also recommended that patient start exercising with a goal of 30 minutes of aerobic exercise a minimum of 5 days per week.  

## 2015-02-24 NOTE — Assessment & Plan Note (Signed)
Well controlled on current regimen. Renal function stable, no changes today.  Lab Results  Component Value Date   CREATININE 0.9 05/08/2014   Lab Results  Component Value Date   NA 140 05/08/2014   K 4.3 05/08/2014   CL 105 01/06/2014   CO2 30 01/06/2014

## 2015-02-24 NOTE — Patient Instructions (Addendum)
I'm glad you are losing weight!  I would like you to continue your efforts at weight loss  To get your BMI < 30  Consider trying  Unsweetened almond milk  in your cereal and in your "smoothies"   to cut calories, carbs and cholesterol   Consider trying Breyer's Carb smart fudgsicles or ice cream bars for dessert  Try using WASA crackers instead of saltines  With your peanut butter  To shave off carbs and calories   Please return for fasting labs soon.   Your Screening showed mild buildup of atherosclerosis in your coronary arteries,  So we will repeat your cholesterol and I will assess your risk for stroke and heart attack using the Framingham risk calculator

## 2015-02-25 ENCOUNTER — Other Ambulatory Visit (INDEPENDENT_AMBULATORY_CARE_PROVIDER_SITE_OTHER): Payer: Medicare Other

## 2015-02-25 ENCOUNTER — Encounter: Payer: Self-pay | Admitting: *Deleted

## 2015-02-25 DIAGNOSIS — E785 Hyperlipidemia, unspecified: Secondary | ICD-10-CM | POA: Diagnosis not present

## 2015-02-25 DIAGNOSIS — R5383 Other fatigue: Secondary | ICD-10-CM | POA: Diagnosis not present

## 2015-02-25 LAB — COMPREHENSIVE METABOLIC PANEL
ALK PHOS: 95 U/L (ref 39–117)
ALT: 15 U/L (ref 0–53)
AST: 25 U/L (ref 0–37)
Albumin: 3.7 g/dL (ref 3.5–5.2)
BUN: 18 mg/dL (ref 6–23)
CO2: 26 mEq/L (ref 19–32)
Calcium: 8.9 mg/dL (ref 8.4–10.5)
Chloride: 106 mEq/L (ref 96–112)
Creatinine, Ser: 0.81 mg/dL (ref 0.40–1.50)
GFR: 96.1 mL/min (ref >60.00)
GLUCOSE: 95 mg/dL (ref 70–99)
POTASSIUM: 4.4 meq/L (ref 3.5–5.1)
Sodium: 140 mEq/L (ref 135–145)
TOTAL PROTEIN: 6.3 g/dL (ref 6.0–8.3)
Total Bilirubin: 0.7 mg/dL (ref 0.2–1.2)

## 2015-02-25 LAB — LIPID PANEL
CHOL/HDL RATIO: 3
Cholesterol: 169 mg/dL (ref 0–200)
HDL: 51.3 mg/dL (ref >39.00)
LDL CALC: 104 mg/dL — AB (ref 0–99)
NONHDL: 117.85
TRIGLYCERIDES: 68 mg/dL (ref 0.0–149.0)
VLDL: 13.6 mg/dL (ref 0.0–40.0)

## 2015-02-25 LAB — CBC WITH DIFFERENTIAL/PLATELET
BASOS ABS: 0 10*3/uL (ref 0.0–0.1)
Basophils Relative: 0.6 % (ref 0.0–3.0)
EOS ABS: 0.1 10*3/uL (ref 0.0–0.7)
Eosinophils Relative: 2 % (ref 0.0–5.0)
HEMATOCRIT: 43.6 % (ref 39.0–52.0)
HEMOGLOBIN: 14.6 g/dL (ref 13.0–17.0)
LYMPHS PCT: 23.7 % (ref 12.0–46.0)
Lymphs Abs: 1.3 10*3/uL (ref 0.7–4.0)
MCHC: 33.3 g/dL (ref 30.0–36.0)
MCV: 95.1 fl (ref 78.0–100.0)
MONOS PCT: 9.2 % (ref 3.0–12.0)
Monocytes Absolute: 0.5 10*3/uL (ref 0.1–1.0)
NEUTROS ABS: 3.6 10*3/uL (ref 1.4–7.7)
Neutrophils Relative %: 64.5 % (ref 43.0–77.0)
PLATELETS: 179 10*3/uL (ref 150.0–400.0)
RBC: 4.59 Mil/uL (ref 4.22–5.81)
RDW: 12.8 % (ref 11.5–15.5)
WBC: 5.6 10*3/uL (ref 4.0–10.5)

## 2015-02-25 LAB — TSH: TSH: 1.74 u[IU]/mL (ref 0.35–4.50)

## 2015-02-26 ENCOUNTER — Encounter: Payer: Self-pay | Admitting: *Deleted

## 2015-03-05 ENCOUNTER — Encounter: Payer: Self-pay | Admitting: Podiatry

## 2015-03-05 ENCOUNTER — Ambulatory Visit (INDEPENDENT_AMBULATORY_CARE_PROVIDER_SITE_OTHER): Payer: Medicare Other | Admitting: Podiatry

## 2015-03-05 VITALS — BP 140/70 | HR 44 | Resp 17

## 2015-03-05 DIAGNOSIS — L84 Corns and callosities: Secondary | ICD-10-CM

## 2015-03-05 NOTE — Progress Notes (Signed)
Patient ID: Christopher Jenkins, male   DOB: Sep 08, 1926, 81 y.o.   MRN: [id number]  Subjective: 81 year old male presents the office today with an unscheduld appointment for  complaints of painful callus on the side of his left foot. He states that he periodically. He states the areas painful with  Shoes and pressure. Denies any redness or drainage. No other complaints at this time.  Objective: AAO 3, NAD Neurovascular status unchanged Hyperkeratotic lesion present on the plantar/lateral aspect of the left submetatarsal 5 area. There is tenderness palpation overlying this lesion. Upon debridement no underlying ulceration, drainage or other clinical signs of infection. No other open lesions or pre-ulcer lesions identified bilaterally. No other areas of tenderness to bilateral lower extremities. No overlying edema, erythema, increase in warmth. No pain with calf compression, swelling, warmth, erythema.  Assessment: 81 year old male with symptomatic hyperkeratotic lesions left foot  Plan: -Treatment options discussed including all alternatives, risks, and complications -Hyperkeratotic lesion sharply debrided 1 without complications/bleeding to patient comfort. Upon debridement there was resolution of symptoms. Dispensed offloading pads. Discussed shoe gear modifications. Follow-up as needed. Call any questions or concerns in the meantime.  Celesta Gentile, DPM

## 2015-03-12 ENCOUNTER — Encounter: Payer: Self-pay | Admitting: Urology

## 2015-03-12 ENCOUNTER — Ambulatory Visit (INDEPENDENT_AMBULATORY_CARE_PROVIDER_SITE_OTHER): Payer: Medicare Other | Admitting: Urology

## 2015-03-12 VITALS — BP 149/63 | HR 46 | Ht 68.0 in | Wt 208.7 lb

## 2015-03-12 DIAGNOSIS — N138 Other obstructive and reflux uropathy: Secondary | ICD-10-CM | POA: Insufficient documentation

## 2015-03-12 DIAGNOSIS — N401 Enlarged prostate with lower urinary tract symptoms: Secondary | ICD-10-CM

## 2015-03-12 NOTE — Progress Notes (Signed)
03/12/2015 2:11 PM   Christopher Jenkins July 24, 1926 [id number]  Referring provider: [name], MD [place], [place] [address]  Chief Complaint  Patient presents with  . Benign Prostatic Hypertrophy    6 month recheck  . Elevated PSA    HPI: Patient is an 81 year old white male with a history of BPH and L UTS who presents today for 6 month recheck.  BPH WITH LUTS His IPSS score today is 1, which is mild lower urinary tract symptomatology. He is delighted with his quality life due to his urinary symptoms.   He denies any dysuria, hematuria or suprapubic pain.  He currently taking finasteride.   His has had TRUSPBx of prostate in 2013 for a PSA of 63.8 ng/mL which was negative.   He also denies any recent fevers, chills, nausea or vomiting.   He does not have a family history of PCa.      IPSS      03/12/15 1300       International Prostate Symptom Score   How often have you had the sensation of not emptying your bladder? Not at All     How often have you had to urinate less than every two hours? Not at All     How often have you found you stopped and started again several times when you urinated? Not at All     How often have you found it difficult to postpone urination? Not at All     How often have you had a weak urinary stream? Not at All     How often have you had to strain to start urination? Not at All     How many times did you typically get up at night to urinate? 1 Time     Total IPSS Score 1     Quality of Life due to urinary symptoms   If you were to spend the rest of your life with your urinary condition just the way it is now how would you feel about that? Delighted        Score:  1-7 Mild 8-19 Moderate 20-35 Severe  PMH: Past Medical History  Diagnosis Date  . Hypertrophy (benign) of prostate     asymptomatic on finasteride. will f/up with urologist [name]  . Hyperlipidemia   . Hypertension   . Tubular adenoma  of colon   . Obesity   . Elevated PSA   . BPH (benign prostatic hyperplasia)   . Urinary retention   . Gross hematuria   . Duodenal mass   . Overweight     Surgical History: Past Surgical History  Procedure Laterality Date  . Cataracts      Home Medications:    Medication List       This list is accurate as of: 03/12/15  2:11 PM.  Always use your most recent med list.               Calcium-Vitamin D 600-125 MG-UNIT Tabs  Take 1 tablet by mouth daily.     docusate 50 MG/5ML liquid  Commonly known as:  COLACE  3 drops in each ear daily to prevent occlusion with wax     finasteride 5 MG tablet  Commonly known as:  PROSCAR  TAKE 1 TABLET (5 MG TOTAL) BY MOUTH DAILY.     fish oil-omega-3 fatty acids 1000 MG capsule  Take 1 g by mouth daily.     memantine 10  MG tablet  Commonly known as:  NAMENDA  Take 1 tablet (10 mg total) by mouth 2 (two) times daily.     multivitamin with minerals tablet  Take 1 tablet by mouth daily.     nebivolol 2.5 MG tablet  Commonly known as:  BYSTOLIC  Take 1 tablet (2.5 mg total) by mouth daily.     niacin 250 MG tablet  Take 250 mg by mouth daily.     VITAMIN D3 PO  Take 1 tablet by mouth daily.        Allergies: No Known Allergies  Family History: Family History  Problem Relation Age of Onset  . Heart disease Mother   . Heart disease Father   . Kidney disease Neg Hx   . Prostate cancer Neg Hx     Social History:  reports that he has never smoked. He has never used smokeless tobacco. He reports that he drinks alcohol. He reports that he does not use illicit drugs.  ROS: UROLOGY Frequent Urination?: No Hard to postpone urination?: No Burning/pain with urination?: No Get up at night to urinate?: No Leakage of urine?: No Urine stream starts and stops?: No Trouble starting stream?: No Do you have to strain to urinate?: No Blood in urine?: No Urinary tract infection?: No Sexually transmitted disease?: No Injury  to kidneys or bladder?: No Painful intercourse?: No Weak stream?: No Erection problems?: No Penile pain?: No  Gastrointestinal Nausea?: No Vomiting?: No Indigestion/heartburn?: No Diarrhea?: No Constipation?: No  Constitutional Fever: No Night sweats?: No Weight loss?: No Fatigue?: No  Skin Skin rash/lesions?: No Itching?: No  Eyes Blurred vision?: No Double vision?: No  Ears/Nose/Throat Sore throat?: No Sinus problems?: No  Hematologic/Lymphatic Swollen glands?: No Easy bruising?: No  Cardiovascular Leg swelling?: No Chest pain?: No  Respiratory Cough?: No Shortness of breath?: No  Endocrine Excessive thirst?: No  Musculoskeletal Back pain?: No Joint pain?: No  Neurological Headaches?: No Dizziness?: No  Psychologic Depression?: No Anxiety?: No  Physical Exam: BP 149/63 mmHg  Pulse 46  Ht 5\' 8"  (1.727 m)  Wt 208 lb 11.2 oz (94.666 kg)  BMI 31.74 kg/m2  GU: Patient with circumcised phallus.  Urethral meatus is patent.  No penile discharge. No penile lesions or rashes. Scrotum without lesions, cysts, rashes and/or edema.  Testicles are located scrotally bilaterally. No masses are appreciated in the testicles. Left and right epididymis are normal. Rectal: Patient with  normal sphincter tone. Perineum without scarring or rashes. No rectal masses are appreciated. Prostate is approximately 70 grams, no nodules are appreciated. Seminal vesicles are normal.   Laboratory Data: Lab Results  Component Value Date   WBC 5.6 02/25/2015   HGB 14.6 02/25/2015   HCT 43.6 02/25/2015   MCV 95.1 02/25/2015   PLT 179.0 02/25/2015    Lab Results  Component Value Date   CREATININE 0.81 02/25/2015    Assessment & Plan:    1. BPH with LUTS:   Patient's IPSS score is 1/0.  He is having good results with the finasteride and will continue the medication.  He stated he did not need a refill at this time.   His last PSA was 5.3 ng/mL on 09/11/2014.  He will  follow up in 12 months for a  DRE and an IPSS.      Return in about 1 year (around 03/11/2016) for IPSS.  Zara Council, [facility] [address] [place], [place] [address] [phone]

## 2015-03-16 DIAGNOSIS — H4912 Fourth [trochlear] nerve palsy, left eye: Secondary | ICD-10-CM | POA: Diagnosis not present

## 2015-03-18 ENCOUNTER — Telehealth: Payer: Self-pay

## 2015-03-18 NOTE — Telephone Encounter (Signed)
Patient walked in and is concerned that he went to the opthalmalogist, Dr. Merla Riches to check his "Doble Vision".  He ran a few tests and said that the patient was a stroke candidate and he states that the doctor told yo on the phone?  He wanted to see if a MRI Exam had been ordered or if you had follow up suggestions? Please advise.

## 2015-03-18 NOTE — Telephone Encounter (Signed)
Spoke with Patient and scheduled appointment for next Friday the 7th at 130pm.  Lorriane Shire, Can you assist with getting his records? Thanks.  Lavella Lemons

## 2015-03-18 NOTE — Telephone Encounter (Signed)
I did not speak with Dr George Ina about ths.  Request the records, and have patient make an appointment

## 2015-03-18 NOTE — Telephone Encounter (Signed)
Left message with medical records to call back.

## 2015-03-24 ENCOUNTER — Telehealth: Payer: Self-pay | Admitting: Internal Medicine

## 2015-03-24 NOTE — Telephone Encounter (Signed)
Left msg with [facility] records to get pt last office notes and test results.

## 2015-03-27 ENCOUNTER — Encounter (INDEPENDENT_AMBULATORY_CARE_PROVIDER_SITE_OTHER): Payer: Self-pay

## 2015-03-27 ENCOUNTER — Telehealth: Payer: Self-pay | Admitting: Internal Medicine

## 2015-03-27 ENCOUNTER — Ambulatory Visit (INDEPENDENT_AMBULATORY_CARE_PROVIDER_SITE_OTHER): Payer: Medicare Other | Admitting: Internal Medicine

## 2015-03-27 ENCOUNTER — Encounter: Payer: Self-pay | Admitting: Internal Medicine

## 2015-03-27 VITALS — BP 130/64 | HR 51 | Temp 98.8°F | Resp 12 | Ht 68.0 in | Wt 212.5 lb

## 2015-03-27 DIAGNOSIS — G458 Other transient cerebral ischemic attacks and related syndromes: Secondary | ICD-10-CM

## 2015-03-27 DIAGNOSIS — H532 Diplopia: Secondary | ICD-10-CM | POA: Diagnosis not present

## 2015-03-27 MED ORDER — ASPIRIN EC 81 MG PO TBEC: 81.0000 mg | DELAYED_RELEASE_TABLET | Freq: Every day | ORAL | Status: DC

## 2015-03-27 MED ORDER — ATORVASTATIN CALCIUM 40 MG PO TABS: 40.0000 mg | ORAL_TABLET | Freq: Every day | ORAL | Status: DC

## 2015-03-27 MED ORDER — CLOPIDOGREL BISULFATE 75 MG PO TABS: 75.0000 mg | ORAL_TABLET | Freq: Every day | ORAL | Status: DC

## 2015-03-27 NOTE — Patient Instructions (Addendum)
I am starting you on two new medications to decrease your risk of having a stroke:  Plavix (clopidigrel) : to prevent blood clots.  Continue your baby aspirin daily   Lipitor (atorvastatin):  A cholesterol medication.  This has been shown to stabilize placque in your coronary and cerebral arteries   I have also ordered an MRI/MRA of your brain, and carotid dopplers  to look for arteries that may be threatening to close up.    If you have any episode of one sided numbness,  weakness, or  double vision that lasts > 15 minutes,  Call 911  Return in one month

## 2015-03-27 NOTE — Progress Notes (Signed)
Pre-visit discussion using our clinic review tool. No additional management support is needed unless otherwise documented below in the visit note.  

## 2015-03-27 NOTE — Progress Notes (Signed)
Subjective:  Patient ID: Christopher Jenkins, male    DOB: 1927-04-16  Age: 81 y.o. MRN: [id number]  CC: The primary encounter diagnosis was Other specified transient cerebral ischemias. A diagnosis of Transient diplopia was also pertinent to this visit.  HPI Christopher Jenkins presents for follow up on recent episodes of diagonal diplopia reported to his opthalmologist last week. 3 epsiodes occurred over a 3 day period and have not occurred since last week.  He states that all 3 episodes occurred while watching television. He was not lying on his side. They occurred in the evening,  He does not recall being sleepy, and recalls that  that the double vision resolved if he  moved closer to the television set.  During the same period he also noticed that  his vision felt "unfocused but not double"  while he was walking around outside . He has not had double vision with any other activities but watching television.     Patient has a history of hypertension, obesity, and vascular dementia.  recent [facility] screening in August for carotiid artery stenosis was notable for mild bilateral placque, normal ABI's ,.and BMI > 30.  His  untreated LDl was 106 .  His last MRi brain was in January 2015 during an evaluation for dementia, at which time generalized  atrophy,  Old small vessel cerebellar infarctions, and chronic small vessel ischemic changes were noted.  Outpatient Prescriptions Prior to Visit  Medication Sig Dispense Refill  . Calcium Carbonate-Vitamin D (CALCIUM-VITAMIN D) 600-125 MG-UNIT TABS Take 1 tablet by mouth daily.    . Cholecalciferol (VITAMIN D3 PO) Take 1 tablet by mouth daily.      Marland Kitchen docusate (COLACE) 50 MG/5ML liquid 3 drops in each ear daily to prevent occlusion with wax 100 mL 0  . finasteride (PROSCAR) 5 MG tablet TAKE 1 TABLET (5 MG TOTAL) BY MOUTH DAILY. 90 tablet 3  . fish oil-omega-3 fatty acids 1000 MG capsule Take 1 g by mouth daily.      . memantine (NAMENDA) 10 MG  tablet Take 1 tablet (10 mg total) by mouth 2 (two) times daily. 60 tablet 2  . Multiple Vitamins-Minerals (MULTIVITAMIN WITH MINERALS) tablet Take 1 tablet by mouth daily.    . nebivolol (BYSTOLIC) 2.5 MG tablet Take 1 tablet (2.5 mg total) by mouth daily. 30 tablet 3  . niacin 250 MG tablet Take 250 mg by mouth daily.      Marland Kitchen atorvastatin (LIPITOR) 40 MG tablet Take 1 tablet (40 mg total) by mouth daily. (Patient not taking: Reported on 03/27/2015) 90 tablet 3  . clopidogrel (PLAVIX) 75 MG tablet Take 1 tablet (75 mg total) by mouth daily. (Patient not taking: Reported on 03/27/2015) 90 tablet 3   No facility-administered medications prior to visit.    Review of Systems;  Patient denies headache, fevers, malaise, unintentional weight loss, skin rash, eye pain, sinus congestion and sinus pain, sore throat, dysphagia,  hemoptysis , cough, dyspnea, wheezing, chest pain, palpitations, orthopnea, edema, abdominal pain, nausea, melena, diarrhea, constipation, flank pain, dysuria, hematuria, urinary  Frequency, nocturia, numbness, tingling, seizures,  Focal weakness, Loss of consciousness,  Tremor, insomnia, depression, anxiety, and suicidal ideation.      Objective:  BP 130/64 mmHg  Pulse 51  Temp(Src) 98.8 F (37.1 C) (Oral)  Resp 12  Ht 5\' 8"  (1.727 m)  Wt 212 lb 8 oz (96.389 kg)  BMI 32.32 kg/m2  SpO2 93%  BP Readings from Last 3 Encounters:  03/27/15 130/64  03/12/15 149/63  03/05/15 140/70    Wt Readings from Last 3 Encounters:  03/27/15 212 lb 8 oz (96.389 kg)  03/12/15 208 lb 11.2 oz (94.666 kg)  02/24/15 208 lb (94.348 kg)    General appearance: alert, cooperative and appears stated age Ears: normal TM's and external ear canals both ears Throat: lips, mucosa, and tongue normal; teeth and gums normal Neck: no adenopathy, no carotid bruit, supple, symmetrical, trachea midline and thyroid not enlarged, symmetric, no tenderness/mass/nodules Back: symmetric, no curvature. ROM  normal. No CVA tenderness. Lungs: clear to auscultation bilaterally Heart: regular rate and rhythm, S1, S2 normal, no murmur, click, rub or gallop Abdomen: soft, non-tender; bowel sounds normal; no masses,  no organomegaly Pulses: 2+ and symmetric Skin: Skin color, texture, turgor normal. No rashes or lesions Lymph nodes: Cervical, supraclavicular, and axillary nodes normal. Neuro: CNs 2-12 intact. DTRs 2+/4 in biceps, brachioradialis, patellars and achilles. Muscle strength 5/5 in upper and lower exremities. Fine resting tremor bilaterally both hands cerebellar function normal. Romberg negative.  No pronator drift.   Gait normal.  No results found for: HGBA1C  Lab Results  Component Value Date   CREATININE 0.81 02/25/2015   CREATININE 0.9 05/08/2014   CREATININE 1.0 01/06/2014    Lab Results  Component Value Date   WBC 5.6 02/25/2015   HGB 14.6 02/25/2015   HCT 43.6 02/25/2015   PLT 179.0 02/25/2015   GLUCOSE 95 02/25/2015   CHOL 169 02/25/2015   TRIG 68.0 02/25/2015   HDL 51.30 02/25/2015   LDLDIRECT 125.1 01/06/2014   LDLCALC 104* 02/25/2015   ALT 15 02/25/2015   AST 25 02/25/2015   NA 140 02/25/2015   K 4.4 02/25/2015   CL 106 02/25/2015   CREATININE 0.81 02/25/2015   BUN 18 02/25/2015   CO2 26 02/25/2015   TSH 1.74 02/25/2015    No results found.  Assessment & Plan:   Problem List Items Addressed This Visit    Transient diplopia    Recent episosdes occurred while watching TV and improved with reduction of viewing distance from the TV. Nevertheless he has multiple risk factors for CVA including hypertension and hyperlipidemia.  Advised patient to treat events as TIAS and therefore increase his anti platelet therapy from daily ASA to ASA and Plavix,  And to add statin .  EKG was sinus bradycardia today.  MRI/MRA and carotid dopplers have been ordered .  Patient was advised to call 911 if he has any stroke symptoms lasting > 15 minutes.        Other Visit Diagnoses     Other specified transient cerebral ischemias    -  Primary    Relevant Medications    aspirin EC 81 MG tablet    Other Relevant Orders    EKG 12-Lead (Completed)       I am having Mr. Kuenzi start on aspirin EC. I am also having him maintain his niacin, fish oil-omega-3 fatty acids, Cholecalciferol (VITAMIN D3 PO), finasteride, multivitamin with minerals, Calcium-Vitamin D, nebivolol, memantine, docusate, clopidogrel, and atorvastatin.  Meds ordered this encounter  Medications  . aspirin EC 81 MG tablet    Sig: Take 1 tablet (81 mg total) by mouth daily.    Dispense:  90 tablet    Refill:  1    There are no discontinued medications.  Follow-up: No Follow-up on file.   [name], MD

## 2015-03-29 DIAGNOSIS — H532 Diplopia: Secondary | ICD-10-CM | POA: Insufficient documentation

## 2015-03-29 NOTE — Assessment & Plan Note (Signed)
Recent episosdes occurred while watching TV and improved with reduction of viewing distance from the TV. Nevertheless he has multiple risk factors for CVA including hypertension and hyperlipidemia.  Advised patient to treat events as TIAS and therefore increase his anti platelet therapy from daily ASA to ASA and Plavix,  And to add statin .  EKG was sinus bradycardia today.  MRI/MRA and carotid dopplers have been ordered .  Patient was advised to call 911 if he has any stroke symptoms lasting > 15 minutes.

## 2015-03-30 ENCOUNTER — Telehealth: Payer: Self-pay | Admitting: *Deleted

## 2015-03-30 MED ORDER — CLOPIDOGREL BISULFATE 75 MG PO TABS: 75.0000 mg | ORAL_TABLET | Freq: Every day | ORAL | Status: DC

## 2015-03-30 MED ORDER — ATORVASTATIN CALCIUM 40 MG PO TABS: 40.0000 mg | ORAL_TABLET | Freq: Every day | ORAL | Status: DC

## 2015-03-30 NOTE — Telephone Encounter (Signed)
Sent scripts to pharmacy electronically.

## 2015-03-30 NOTE — Telephone Encounter (Signed)
Patient stopped in

## 2015-04-03 ENCOUNTER — Other Ambulatory Visit: Payer: Self-pay | Admitting: Urology

## 2015-04-06 ENCOUNTER — Ambulatory Visit
Admission: RE | Admit: 2015-04-06 | Discharge: 2015-04-06 | Disposition: A | Discharge status: Home / Self Care | Payer: Medicare Other | Source: Ambulatory Visit | Attending: Internal Medicine | Admitting: Internal Medicine

## 2015-04-06 DIAGNOSIS — H532 Diplopia: Secondary | ICD-10-CM | POA: Diagnosis not present

## 2015-04-06 DIAGNOSIS — R4182 Altered mental status, unspecified: Secondary | ICD-10-CM | POA: Diagnosis not present

## 2015-04-06 DIAGNOSIS — G458 Other transient cerebral ischemic attacks and related syndromes: Secondary | ICD-10-CM

## 2015-04-06 DIAGNOSIS — I10 Essential (primary) hypertension: Secondary | ICD-10-CM | POA: Insufficient documentation

## 2015-04-07 ENCOUNTER — Other Ambulatory Visit: Payer: Self-pay | Admitting: Internal Medicine

## 2015-04-07 DIAGNOSIS — G459 Transient cerebral ischemic attack, unspecified: Secondary | ICD-10-CM

## 2015-04-08 ENCOUNTER — Other Ambulatory Visit: Payer: Self-pay | Admitting: Internal Medicine

## 2015-04-08 ENCOUNTER — Encounter: Payer: Self-pay | Admitting: Internal Medicine

## 2015-04-08 ENCOUNTER — Ambulatory Visit (INDEPENDENT_AMBULATORY_CARE_PROVIDER_SITE_OTHER): Payer: Medicare Other | Admitting: Internal Medicine

## 2015-04-08 ENCOUNTER — Telehealth: Payer: Self-pay | Admitting: Internal Medicine

## 2015-04-08 ENCOUNTER — Ambulatory Visit
Admission: RE | Admit: 2015-04-08 | Discharge: 2015-04-08 | Disposition: A | Discharge status: Home / Self Care | Payer: Medicare Other | Source: Ambulatory Visit | Attending: Internal Medicine | Admitting: Internal Medicine

## 2015-04-08 VITALS — BP 156/80 | HR 51 | Temp 98.6°F | Resp 12 | Ht 68.0 in | Wt 215.1 lb

## 2015-04-08 DIAGNOSIS — M79661 Pain in right lower leg: Secondary | ICD-10-CM

## 2015-04-08 DIAGNOSIS — H532 Diplopia: Secondary | ICD-10-CM | POA: Diagnosis not present

## 2015-04-08 DIAGNOSIS — G458 Other transient cerebral ischemic attacks and related syndromes: Secondary | ICD-10-CM

## 2015-04-08 DIAGNOSIS — M7989 Other specified soft tissue disorders: Secondary | ICD-10-CM

## 2015-04-08 DIAGNOSIS — M79604 Pain in right leg: Secondary | ICD-10-CM

## 2015-04-08 DIAGNOSIS — G450 Vertebro-basilar artery syndrome: Secondary | ICD-10-CM

## 2015-04-08 MED ORDER — PREDNISONE 10 MG PO TABS: ORAL_TABLET | ORAL | Status: DC

## 2015-04-08 MED ORDER — TRAMADOL HCL 50 MG PO TABS: 50.0000 mg | ORAL_TABLET | Freq: Three times a day (TID) | ORAL | Status: DC | PRN

## 2015-04-08 NOTE — Telephone Encounter (Signed)
Tried to call and offer patient an appointment at 2.30 no answer left voicemail to call office

## 2015-04-08 NOTE — Assessment & Plan Note (Signed)
With mild right calf swelling .  Need to rule out DVT , ultrasound ordered STAT.

## 2015-04-08 NOTE — Telephone Encounter (Signed)
Patient scheduled at 4.30

## 2015-04-08 NOTE — Telephone Encounter (Signed)
Pt called stating he had an MRI appt  Monday. Sunday he had pain in his right leg below the hip, since then moving from a sitting to standing position has been very difficult, walking is a chore, pt states that he has been taking advil and aleve around the clock with no relief , pt has stopped his morning and afternoon walks,  Please advise pt at  [phone]. Pt also states that he awaits for your decides to see him or be referred to a specialist.

## 2015-04-08 NOTE — Telephone Encounter (Signed)
Have tried to reach patient for further details but no answer. Left voicemail to call office this is new onset pain? FYI

## 2015-04-08 NOTE — Telephone Encounter (Signed)
Pt called back returning your call. Thank You! °

## 2015-04-08 NOTE — Addendum Note (Signed)
Addended by: [name] on: 04/08/2015 04:40 PM   Modules accepted: Orders

## 2015-04-08 NOTE — Assessment & Plan Note (Addendum)
Workup for CVA underway with MRA negative for stenosis but low flow state reported.   MRI brain not done due to change in ordering policy by Radiology.  Patient advised of results and need for MRI brain now, along with ECHO of heart

## 2015-04-08 NOTE — Progress Notes (Signed)
Subjective:  Patient ID: Christopher Jenkins, male    DOB: 1926/07/25  Age: 81 y.o. MRN: [id number]  CC: The primary encounter diagnosis was Calf pain, right. Diagnoses of Pain and swelling of right lower leg, Pain of right lower extremity, Transient diplopia, and Right leg pain were also pertinent to this visit.  HPI Christopher Jenkins presents for follow up on recent imaging  (MRA) od brain done because of TIA symptoms involving transient diplopia.  He was also urgently scheduled to evaluation of  new onset  right leg pain and swelling which began 3 days ago, on Sunday .  First noticed it when he developed pain with walking and scending and descending stairs ,  He has no history of  hip pain or extended travel. Played tennis on Thursday. No history of falls,  Did not notice joint pain after Thursday.    Outpatient Prescriptions Prior to Visit  Medication Sig Dispense Refill  . aspirin EC 81 MG tablet Take 1 tablet (81 mg total) by mouth daily. 90 tablet 1  . atorvastatin (LIPITOR) 40 MG tablet Take 1 tablet (40 mg total) by mouth daily. 90 tablet 3  . Calcium Carbonate-Vitamin D (CALCIUM-VITAMIN D) 600-125 MG-UNIT TABS Take 1 tablet by mouth daily.    . Cholecalciferol (VITAMIN D3 PO) Take 1 tablet by mouth daily.      . clopidogrel (PLAVIX) 75 MG tablet Take 1 tablet (75 mg total) by mouth daily. 90 tablet 3  . docusate (COLACE) 50 MG/5ML liquid 3 drops in each ear daily to prevent occlusion with wax 100 mL 0  . finasteride (PROSCAR) 5 MG tablet TAKE 1 TABLET BY MOUTH EVERY DAY 90 tablet 3  . fish oil-omega-3 fatty acids 1000 MG capsule Take 1 g by mouth daily.      . memantine (NAMENDA) 10 MG tablet Take 1 tablet (10 mg total) by mouth 2 (two) times daily. 60 tablet 2  . Multiple Vitamins-Minerals (MULTIVITAMIN WITH MINERALS) tablet Take 1 tablet by mouth daily.    . nebivolol (BYSTOLIC) 2.5 MG tablet Take 1 tablet (2.5 mg total) by mouth daily. 30 tablet 3  . niacin 250 MG tablet  Take 250 mg by mouth daily.       No facility-administered medications prior to visit.    Review of Systems;  Patient denies headache, fevers, malaise, unintentional weight loss, skin rash, eye pain, sinus congestion and sinus pain, sore throat, dysphagia,  hemoptysis , cough, dyspnea, wheezing, chest pain, palpitations, orthopnea, edema, abdominal pain, nausea, melena, diarrhea, constipation, flank pain, dysuria, hematuria, urinary  Frequency, nocturia, numbness, tingling, seizures,  Focal weakness, Loss of consciousness,  Tremor, insomnia, depression, anxiety, and suicidal ideation.      Objective:  BP 156/80 mmHg  Pulse 51  Temp(Src) 98.6 F (37 C) (Oral)  Resp 12  Ht 5\' 8"  (1.727 m)  Wt 215 lb 2 oz (97.58 kg)  BMI 32.72 kg/m2  SpO2 94%  BP Readings from Last 3 Encounters:  04/08/15 156/80  03/27/15 130/64  03/12/15 149/63    Wt Readings from Last 3 Encounters:  04/10/15 215 lb (97.523 kg)  04/08/15 215 lb 2 oz (97.58 kg)  03/27/15 212 lb 8 oz (96.389 kg)    General appearance: alert, cooperative and appears stated age Ears: normal TM's and external ear canals both ears Throat: lips, mucosa, and tongue normal; teeth and gums normal Neck: no adenopathy, no carotid bruit, supple, symmetrical, trachea midline and thyroid not enlarged, symmetric, no tenderness/mass/nodules  Back: symmetric, no curvature. ROM normal. No CVA tenderness. Lungs: clear to auscultation bilaterally Heart: regular rate and rhythm, S1, S2 normal, no murmur, click, rub or gallop Abdomen: soft, non-tender; bowel sounds normal; no masses,  no organomegaly Ext:  No knee effusion, no bruising ,  Calf slightly larger on right negative Homan's sign.  Pulses: 2+ and symmetric Skin: Skin color, texture, turgor normal. No rashes or lesions Lymph nodes: Cervical, supraclavicular, and axillary nodes normal.  No results found for: HGBA1C  Lab Results  Component Value Date   CREATININE 0.81 02/25/2015    CREATININE 0.9 05/08/2014   CREATININE 1.0 01/06/2014    Lab Results  Component Value Date   WBC 5.6 02/25/2015   HGB 14.6 02/25/2015   HCT 43.6 02/25/2015   PLT 179.0 02/25/2015   GLUCOSE 95 02/25/2015   CHOL 169 02/25/2015   TRIG 68.0 02/25/2015   HDL 51.30 02/25/2015   LDLDIRECT 125.1 01/06/2014   LDLCALC 104* 02/25/2015   ALT 15 02/25/2015   AST 25 02/25/2015   NA 140 02/25/2015   K 4.4 02/25/2015   CL 106 02/25/2015   CREATININE 0.81 02/25/2015   BUN 18 02/25/2015   CO2 26 02/25/2015   TSH 1.74 02/25/2015    Mr Jodene Nam Head Wo Contrast  04/06/2015  CLINICAL DATA:  Episodes of diplopia with altered mental status over the past 2 weeks. EXAM: MRA HEAD WITHOUT CONTRAST TECHNIQUE: Angiographic images of the Circle of Willis were obtained using MRA technique without intravenous contrast. COMPARISON:  MRI brain 07/12/2013. FINDINGS: There is moderately poor flow related enhancement in the intracranial compartment. This is not due to any obvious technical factors. There may be poor cardiac output/ advanced age. Overall the study is diagnostic. The internal carotid arteries are widely patent. The basilar artery is widely patent. Vertebral arteries are codominant. There is no proximal stenosis of the anterior, middle, or posterior cerebral arteries. Fetal origin RIGHT PCA. No definite cerebellar branch occlusion. No intracranial aneurysm. IMPRESSION: Moderately poor quality intracranial MRA without proximal flow limiting stenosis or occlusion of the medium to large vessels. If concern for recent infarction, routine brain MR could be helpful in further evaluation. Electronically Signed   By: Staci Righter M.D.   On: 04/06/2015 16:40    Assessment & Plan:   Problem List Items Addressed This Visit    Transient diplopia    Workup for CVA underway with MRA negative for stenosis but low flow state reported.   MRI brain not done due to change in ordering policy by Radiology.  Patient advised of  results and need for MRI brain now, along with ECHO of heart       Right leg pain    His diffuse pain of 4 days duration and calf asymmetry warrants urgent evaluation for DVT.  Ultrasound was scheduled and report was normal.  Will treat empirically for bursitis of hip with prednisone taper and schedule further imaging depending on outcome of prednisone trial       Pain of right lower extremity    With mild right calf swelling .  Need to rule out DVT , ultrasound ordered STAT.        Other Visit Diagnoses    Calf pain, right    -  Primary    Relevant Orders    [place] Lower Ext Art Right    US Venous Img Lower Unilateral Right (Completed)    Pain and swelling of right lower leg  Relevant Orders    [place] Lower Ext Art Right    US Venous Img Lower Unilateral Right (Completed)      A total of 40 minutes of face to face time was spent with patient more than half of which was spent in counselling and coordination of care  I am having Christopher Jenkins start on traMADol. I am also having him maintain his niacin, fish oil-omega-3 fatty acids, Cholecalciferol (VITAMIN D3 PO), multivitamin with minerals, Calcium-Vitamin D, nebivolol, memantine, docusate, aspirin EC, atorvastatin, clopidogrel, and finasteride.  Meds ordered this encounter  Medications  . traMADol (ULTRAM) 50 MG tablet    Sig: Take 1 tablet (50 mg total) by mouth every 8 (eight) hours as needed.    Dispense:  90 tablet    Refill:  0    There are no discontinued medications.  Follow-up: No Follow-up on file.   [name], MD

## 2015-04-08 NOTE — Telephone Encounter (Signed)
MRI OF THE BRAIN WAS NOT DONE WITH THE  MRA SO MRI HAS NOW BEEN ORDERED,

## 2015-04-08 NOTE — Patient Instructions (Addendum)
I am ordering an ultrasound of your right leg to rule out a blood clot.  [facility]   For your pain,  You should stop the aleve and the advil because of the risk of gastric ulcer while you are taking plavic.  You can add tylenol,  Up to 4 tablets daily  If Your pain is not controlled with aleve/tylenol, you can add a tramadol up to 3 times daily     Your MRA did not show a blocked artery ,  But we need to get an ECHO of your heart and an MRI to make sure you have not had a small stroke.  We will call you with these appointments  Continue plavix and aspirin

## 2015-04-10 ENCOUNTER — Ambulatory Visit
Admission: RE | Admit: 2015-04-10 | Discharge: 2015-04-10 | Disposition: A | Discharge status: Home / Self Care | Payer: Medicare Other | Source: Ambulatory Visit | Attending: Internal Medicine | Admitting: Internal Medicine

## 2015-04-10 ENCOUNTER — Ambulatory Visit: Payer: Medicare Other

## 2015-04-10 DIAGNOSIS — G458 Other transient cerebral ischemic attacks and related syndromes: Secondary | ICD-10-CM | POA: Insufficient documentation

## 2015-04-10 DIAGNOSIS — G459 Transient cerebral ischemic attack, unspecified: Secondary | ICD-10-CM | POA: Diagnosis not present

## 2015-04-10 DIAGNOSIS — F0390 Unspecified dementia without behavioral disturbance: Secondary | ICD-10-CM | POA: Diagnosis not present

## 2015-04-10 MED ORDER — GADOBENATE DIMEGLUMINE 529 MG/ML IV SOLN
20.0000 mL | Freq: Once | INTRAVENOUS | Status: AC | PRN
  Administered 2015-04-10: 20 mL via INTRAVENOUS

## 2015-04-10 NOTE — Progress Notes (Signed)
*  PRELIMINARY RESULTS* Echocardiogram 2D Echocardiogram has been performed.  Christopher Jenkins 04/10/2015, 12:24 PM

## 2015-04-11 DIAGNOSIS — G8929 Other chronic pain: Secondary | ICD-10-CM | POA: Insufficient documentation

## 2015-04-11 DIAGNOSIS — M25551 Pain in right hip: Secondary | ICD-10-CM

## 2015-04-11 NOTE — Assessment & Plan Note (Signed)
His diffuse pain of 4 days duration and calf asymmetry warrants urgent evaluation for DVT.  Ultrasound was scheduled and report was normal.  Will treat empirically for bursitis of hip with prednisone taper and schedule further imaging depending on outcome of prednisone trial

## 2015-04-16 ENCOUNTER — Telehealth: Payer: Self-pay | Admitting: Internal Medicine

## 2015-04-16 DIAGNOSIS — M79604 Pain in right leg: Secondary | ICD-10-CM

## 2015-04-20 ENCOUNTER — Telehealth: Payer: Self-pay | Admitting: Internal Medicine

## 2015-04-20 ENCOUNTER — Ambulatory Visit
Admission: RE | Admit: 2015-04-20 | Discharge: 2015-04-20 | Disposition: A | Discharge status: Home / Self Care | Payer: Medicare Other | Source: Ambulatory Visit | Attending: Internal Medicine | Admitting: Internal Medicine

## 2015-04-20 DIAGNOSIS — M79604 Pain in right leg: Secondary | ICD-10-CM

## 2015-04-20 DIAGNOSIS — M79661 Pain in right lower leg: Secondary | ICD-10-CM | POA: Diagnosis not present

## 2015-04-20 NOTE — Telephone Encounter (Signed)
I have ordered hip films to be done at [facility].  Please give him an appt this week with me or Dr Lacinda Axon Caryl Bis to follow up

## 2015-04-20 NOTE — Telephone Encounter (Signed)
Message handled by Oran Rein RN see previous note.

## 2015-04-20 NOTE — Telephone Encounter (Signed)
Pt called stating that he is not getting any relief from the Tramadol that Dr. Derrel Nip Prescribed last office visit. Pt also wanted me to let Dr. Derrel Nip know that there is no change in his leg this.. Please advise pt

## 2015-04-20 NOTE — Telephone Encounter (Signed)
Spoke with the patient.  He has a dental appointment in [place] today at 2pm and then he will try to go and have the xrays completed.

## 2015-04-20 NOTE — Telephone Encounter (Signed)
He still needs to get the xray's I ordered.  See prior message

## 2015-04-20 NOTE — Telephone Encounter (Signed)
Right leg more pain, on 10/19 you prescribed Tramadol 50mg  every 8 hours PRN.  Patient states it isn't helping, please advise?

## 2015-04-20 NOTE — Telephone Encounter (Signed)
Pt is requesting a referral to [facility] PT. States that his right leg has gotten worse in the past couple days and feels like he needs to use a cane. States that it is very painful. Please advise.

## 2015-04-21 ENCOUNTER — Other Ambulatory Visit: Payer: Self-pay | Admitting: Internal Medicine

## 2015-04-21 MED ORDER — MELOXICAM 7.5 MG PO TABS: 7.5000 mg | ORAL_TABLET | Freq: Every day | ORAL | Status: DC

## 2015-04-28 ENCOUNTER — Telehealth: Payer: Self-pay | Admitting: Urology

## 2015-04-28 DIAGNOSIS — R2689 Other abnormalities of gait and mobility: Secondary | ICD-10-CM | POA: Diagnosis not present

## 2015-04-28 DIAGNOSIS — M79604 Pain in right leg: Secondary | ICD-10-CM | POA: Diagnosis not present

## 2015-04-28 NOTE — Telephone Encounter (Signed)
Patient called today and left a voice mail that he has concerns about urination that he needs to talk about.

## 2015-04-28 NOTE — Telephone Encounter (Signed)
Spoke with pt in reference to urinating problems. Pt stated that starting Sunday when he got up and went to urinate he passed a small clot and was urinating blood. Pt also stated that he is continuing to urinate blood. Please advise.

## 2015-04-29 NOTE — Telephone Encounter (Signed)
He needs an office visit with either me or Ria Comment.

## 2015-04-29 NOTE — Telephone Encounter (Signed)
LMOM

## 2015-04-29 NOTE — Telephone Encounter (Signed)
Patient returned call and appointment has been made for patient to see Zara Council 04/30/15 at 1:30pm.

## 2015-04-30 ENCOUNTER — Ambulatory Visit (INDEPENDENT_AMBULATORY_CARE_PROVIDER_SITE_OTHER): Payer: Medicare Other | Admitting: Urology

## 2015-04-30 ENCOUNTER — Encounter: Payer: Self-pay | Admitting: *Deleted

## 2015-04-30 VITALS — BP 176/78 | HR 52 | Ht 72.0 in | Wt 201.1 lb

## 2015-04-30 DIAGNOSIS — R310 Gross hematuria: Secondary | ICD-10-CM | POA: Diagnosis not present

## 2015-04-30 DIAGNOSIS — R2689 Other abnormalities of gait and mobility: Secondary | ICD-10-CM | POA: Diagnosis not present

## 2015-04-30 DIAGNOSIS — M79604 Pain in right leg: Secondary | ICD-10-CM | POA: Diagnosis not present

## 2015-04-30 LAB — URINALYSIS, COMPLETE
BILIRUBIN UA: NEGATIVE
GLUCOSE, UA: NEGATIVE
KETONES UA: NEGATIVE
LEUKOCYTES UA: NEGATIVE
Nitrite, UA: NEGATIVE
Specific Gravity, UA: >1.03 — ABNORMAL HIGH (ref 1.005–1.030)
Urobilinogen, Ur: 1 mg/dL (ref 0.2–1.0)
pH, UA: 6 (ref 5.0–7.5)

## 2015-04-30 LAB — MICROSCOPIC EXAMINATION
Epithelial Cells (non renal): NONE SEEN /hpf (ref 0–10)
RBC, UA: >30 /hpf — ABNORMAL HIGH (ref 0–?)
WBC, UA: NONE SEEN /hpf (ref 0–?)

## 2015-04-30 NOTE — Progress Notes (Signed)
04/30/2015 12:05 PM   Christopher Jenkins Oct 20, 1926 [id number]  Referring provider: [name], MD [place], [place] [address]  Chief Complaint  Patient presents with  . Hematuria    HPI: Patient is an 81 year old white male who presents today after an episode of passage of clots and gross hematuria that occurred three days ago.  He did not have any dysuria, suprapubic pain or further gross hematuria.  He states that now his urine is darker in color.  He has not had any fevers, chills, nausea or vomiting.  He does not have a history of stones.    He did undergo a hematuria work up in 2013 with a CT Urogram and cystoscopy.  He was found to have bilateral renal cysts.  His PSA had risen to 64 and a biopsy was performed at that time.  He was found to have massive BPH with a 200 cc prostate, but no malignancy was found.  His most recent PSA was 5.3 ng/mL on 09/03/2014.    PMH: Past Medical History  Diagnosis Date  . Hypertrophy (benign) of prostate     asymptomatic on finasteride. will f/up with urologist [name]  . Hyperlipidemia   . Hypertension   . Tubular adenoma of colon   . Obesity   . Elevated PSA   . BPH (benign prostatic hyperplasia)   . Urinary retention   . Gross hematuria   . Duodenal mass   . Overweight     Surgical History: Past Surgical History  Procedure Laterality Date  . Cataracts    . Uvula surgery  1990    Home Medications:    Medication List       This list is accurate as of: 04/30/15 11:59 PM.  Always use your most recent med list.               aspirin EC 81 MG tablet  Take 1 tablet (81 mg total) by mouth daily.     atorvastatin 40 MG tablet  Commonly known as:  LIPITOR  Take 1 tablet (40 mg total) by mouth daily.     Calcium-Vitamin D 600-125 MG-UNIT Tabs  Take 1 tablet by mouth daily.     clopidogrel 75 MG tablet  Commonly known as:  PLAVIX  Take 1 tablet (75 mg total) by mouth daily.     docusate 50 MG/5ML liquid  Commonly known as:  COLACE  3 drops in each ear daily to prevent occlusion with wax     finasteride 5 MG tablet  Commonly known as:  PROSCAR  TAKE 1 TABLET BY MOUTH EVERY DAY     fish oil-omega-3 fatty acids 1000 MG capsule  Take 1 g by mouth daily.     meloxicam 7.5 MG tablet  Commonly known as:  MOBIC  Take 1 tablet (7.5 mg total) by mouth daily.     memantine 10 MG tablet  Commonly known as:  NAMENDA  Take 1 tablet (10 mg total) by mouth 2 (two) times daily.     multivitamin with minerals tablet  Take 1 tablet by mouth daily.     nebivolol 2.5 MG tablet  Commonly known as:  BYSTOLIC  Take 1 tablet (2.5 mg total) by mouth daily.     niacin 250 MG tablet  Take 250 mg by mouth daily.     predniSONE 10 MG tablet  Commonly known as:  DELTASONE  6 tablets on Day 1 ,  then reduce by 1 tablet daily until gone     traMADol 50 MG tablet  Commonly known as:  ULTRAM  Take 1 tablet (50 mg total) by mouth every 8 (eight) hours as needed.     VITAMIN D3 PO  Take 1 tablet by mouth daily.        Allergies: No Known Allergies  Family History: Family History  Problem Relation Age of Onset  . Heart disease Mother   . Heart disease Father   . Kidney disease Neg Hx   . Prostate cancer Neg Hx   . Stroke Mother     Social History:  reports that he has never smoked. He has never used smokeless tobacco. He reports that he drinks alcohol. He reports that he does not use illicit drugs.  ROS: UROLOGY Frequent Urination?: No Hard to postpone urination?: No Burning/pain with urination?: No Get up at night to urinate?: Yes Leakage of urine?: No Urine stream starts and stops?: No Trouble starting stream?: No Do you have to strain to urinate?: No Blood in urine?: Yes Urinary tract infection?: No Sexually transmitted disease?: No Injury to kidneys or bladder?: No Painful intercourse?: No Weak stream?: No Erection problems?: No Penile pain?:  No  Gastrointestinal Nausea?: No Vomiting?: No Indigestion/heartburn?: No Diarrhea?: No Constipation?: No  Constitutional Fever: No Night sweats?: No Weight loss?: No Fatigue?: No  Skin Skin rash/lesions?: No Itching?: No  Eyes Blurred vision?: No Double vision?: No  Ears/Nose/Throat Sore throat?: No Sinus problems?: No  Hematologic/Lymphatic Swollen glands?: No Easy bruising?: No  Cardiovascular Leg swelling?: No Chest pain?: No  Respiratory Cough?: No Shortness of breath?: No  Endocrine Excessive thirst?: No  Musculoskeletal Back pain?: No Joint pain?: No  Neurological Headaches?: No Dizziness?: No  Psychologic Depression?: No Anxiety?: No  Physical Exam: BP 176/78 mmHg  Pulse 52  Ht 6' (1.829 m)  Wt 201 lb 1.6 oz (91.218 kg)  BMI 27.27 kg/m2  GU: No CVA tenderness.  No bladder fullness or masses.  Patient with uncircumcised phallus. Foreskin easily retracted*  Urethral meatus is patent.  No penile discharge. No penile lesions or rashes. Scrotum without lesions, cysts, rashes and/or edema.  Testicles are located scrotally bilaterally. No masses are appreciated in the testicles. Left and right epididymis are normal. Rectal: Patient with  normal sphincter tone. Anus and perineum without scarring or rashes. No rectal masses are appreciated. Prostate is severely enlarged and the entire gland could not be palpated due to its size.    Laboratory Data: Lab Results  Component Value Date   WBC 5.6 02/25/2015   HGB 14.6 02/25/2015   HCT 43.6 02/25/2015   MCV 95.1 02/25/2015   PLT 179.0 02/25/2015    Lab Results  Component Value Date   CREATININE 1.00 04/30/2015     Urinalysis Results for orders placed or performed in visit on 04/30/15  Microscopic Examination  Result Value Ref Range   WBC, UA None seen 0 -  5 /hpf   RBC, UA >30 (H) 0 -  2 /hpf   Epithelial Cells (non renal) None seen 0 - 10 /hpf   Crystals Present (A) N/A   Crystal Type  Amorphous Sediment N/A   Bacteria, UA Many (A) None seen/Few  Urinalysis, Complete  Result Value Ref Range   Specific Gravity, UA >1.030 (H) 1.005 - 1.030   pH, UA 6.0 5.0 - 7.5   Color, UA Brown (A) Yellow   Appearance Ur Cloudy (A) Clear   Leukocytes,  UA Negative Negative   Protein, UA 1+ (A) Negative/Trace   Glucose, UA Negative Negative   Ketones, UA Negative Negative   RBC, UA 3+ (A) Negative   Bilirubin, UA Negative Negative   Urobilinogen, Ur 1.0 0.2 - 1.0 mg/dL   Nitrite, UA Negative Negative   Microscopic Examination See below:   BUN+Creat  Result Value Ref Range   BUN 24 8 - 27 mg/dL   Creatinine, Ser 1.00 0.76 - 1.27 mg/dL   GFR calc non Af Amer 68 >59 mL/min/1.73   GFR calc Af Amer 79 >59 mL/min/1.73   BUN/Creatinine Ratio 24 (H) 10 - 22     Assessment & Plan:    1. Gross hematuria:   Explained to patient the causes of blood in the urine are as follows: stones, BPH, UTI's, damage to the urinary tract and/or cancer. It is explained to the patient that a CT Urogram with contrast material and that in rare instances, an allergic reaction can be serious and even life threatening with the injection of contrast material.   Even at his advanced age and with the risks, the patient wants to pursue this study.  The patient denies any allergies to contrast, iodine and/or seafood and is not taking metformin.  - Urinalysis, Complete   Return for CT Urogram report.  Zara Council, [facility] [address] [place], [place] [address] [phone]

## 2015-05-01 DIAGNOSIS — M79604 Pain in right leg: Secondary | ICD-10-CM | POA: Diagnosis not present

## 2015-05-01 DIAGNOSIS — R2689 Other abnormalities of gait and mobility: Secondary | ICD-10-CM | POA: Diagnosis not present

## 2015-05-01 DIAGNOSIS — R310 Gross hematuria: Secondary | ICD-10-CM | POA: Insufficient documentation

## 2015-05-01 LAB — BUN+CREAT
BUN / CREAT RATIO: 24 — AB (ref 10–22)
BUN: 24 mg/dL (ref 8–27)
Creatinine, Ser: 1 mg/dL (ref 0.76–1.27)
GFR calc non Af Amer: 68 mL/min/{1.73_m2} (ref >59)
GFR, EST AFRICAN AMERICAN: 79 mL/min/{1.73_m2} (ref >59)

## 2015-05-04 ENCOUNTER — Telehealth: Payer: Self-pay

## 2015-05-04 DIAGNOSIS — M79604 Pain in right leg: Secondary | ICD-10-CM | POA: Diagnosis not present

## 2015-05-04 DIAGNOSIS — N390 Urinary tract infection, site not specified: Secondary | ICD-10-CM

## 2015-05-04 DIAGNOSIS — R2689 Other abnormalities of gait and mobility: Secondary | ICD-10-CM | POA: Diagnosis not present

## 2015-05-04 LAB — CULTURE, URINE COMPREHENSIVE

## 2015-05-04 MED ORDER — CIPROFLOXACIN HCL 250 MG PO TABS: 250.0000 mg | ORAL_TABLET | Freq: Two times a day (BID) | ORAL | Status: AC

## 2015-05-04 NOTE — Telephone Encounter (Signed)
-----   Message from Nori Riis, PA-C sent at 05/04/2015  1:34 PM EST ----- Patient has a +UCx.  They need to start Cipro 250 mg one  twice daily for seven days.  He is undergoing a CT Urogram for evaluation for gross hematuria.

## 2015-05-04 NOTE — Telephone Encounter (Signed)
Spoke with pt wife in reference to +ucx. Wife stated she would make pt aware.

## 2015-05-05 DIAGNOSIS — R2689 Other abnormalities of gait and mobility: Secondary | ICD-10-CM | POA: Diagnosis not present

## 2015-05-05 DIAGNOSIS — M79604 Pain in right leg: Secondary | ICD-10-CM | POA: Diagnosis not present

## 2015-05-07 DIAGNOSIS — R2689 Other abnormalities of gait and mobility: Secondary | ICD-10-CM | POA: Diagnosis not present

## 2015-05-07 DIAGNOSIS — M79604 Pain in right leg: Secondary | ICD-10-CM | POA: Diagnosis not present

## 2015-05-08 ENCOUNTER — Ambulatory Visit
Admission: RE | Admit: 2015-05-08 | Discharge: 2015-05-08 | Disposition: A | Discharge status: Home / Self Care | Payer: Medicare Other | Source: Ambulatory Visit | Attending: Urology | Admitting: Urology

## 2015-05-08 DIAGNOSIS — N281 Cyst of kidney, acquired: Secondary | ICD-10-CM | POA: Diagnosis not present

## 2015-05-08 DIAGNOSIS — N289 Disorder of kidney and ureter, unspecified: Secondary | ICD-10-CM | POA: Insufficient documentation

## 2015-05-08 DIAGNOSIS — N200 Calculus of kidney: Secondary | ICD-10-CM | POA: Diagnosis not present

## 2015-05-08 DIAGNOSIS — R310 Gross hematuria: Secondary | ICD-10-CM

## 2015-05-08 MED ORDER — IOHEXOL 300 MG/ML  SOLN
125.0000 mL | Freq: Once | INTRAMUSCULAR | Status: AC | PRN
  Administered 2015-05-08: 125 mL via INTRAVENOUS

## 2015-05-12 DIAGNOSIS — R2689 Other abnormalities of gait and mobility: Secondary | ICD-10-CM | POA: Diagnosis not present

## 2015-05-12 DIAGNOSIS — M79604 Pain in right leg: Secondary | ICD-10-CM | POA: Diagnosis not present

## 2015-05-13 DIAGNOSIS — M79604 Pain in right leg: Secondary | ICD-10-CM | POA: Diagnosis not present

## 2015-05-13 DIAGNOSIS — R2689 Other abnormalities of gait and mobility: Secondary | ICD-10-CM | POA: Diagnosis not present

## 2015-05-15 DIAGNOSIS — M79604 Pain in right leg: Secondary | ICD-10-CM | POA: Diagnosis not present

## 2015-05-15 DIAGNOSIS — R2689 Other abnormalities of gait and mobility: Secondary | ICD-10-CM | POA: Diagnosis not present

## 2015-05-17 ENCOUNTER — Other Ambulatory Visit: Payer: Self-pay | Admitting: Internal Medicine

## 2015-05-18 ENCOUNTER — Other Ambulatory Visit: Payer: Self-pay | Admitting: Internal Medicine

## 2015-05-18 DIAGNOSIS — R2689 Other abnormalities of gait and mobility: Secondary | ICD-10-CM | POA: Diagnosis not present

## 2015-05-18 DIAGNOSIS — M79604 Pain in right leg: Secondary | ICD-10-CM | POA: Diagnosis not present

## 2015-05-20 ENCOUNTER — Ambulatory Visit (INDEPENDENT_AMBULATORY_CARE_PROVIDER_SITE_OTHER): Payer: Medicare Other | Admitting: Urology

## 2015-05-20 ENCOUNTER — Encounter: Payer: Self-pay | Admitting: Urology

## 2015-05-20 VITALS — BP 166/65 | HR 65 | Ht 68.0 in | Wt 207.7 lb

## 2015-05-20 DIAGNOSIS — N2889 Other specified disorders of kidney and ureter: Secondary | ICD-10-CM | POA: Diagnosis not present

## 2015-05-20 DIAGNOSIS — R310 Gross hematuria: Secondary | ICD-10-CM | POA: Diagnosis not present

## 2015-05-20 DIAGNOSIS — N281 Cyst of kidney, acquired: Secondary | ICD-10-CM | POA: Insufficient documentation

## 2015-05-20 LAB — URINALYSIS, COMPLETE
Bilirubin, UA: NEGATIVE
GLUCOSE, UA: NEGATIVE
KETONES UA: NEGATIVE
Leukocytes, UA: NEGATIVE
NITRITE UA: NEGATIVE
UUROB: 0.2 mg/dL (ref 0.2–1.0)
pH, UA: 5 (ref 5.0–7.5)

## 2015-05-20 LAB — MICROSCOPIC EXAMINATION: RBC, UA: NONE SEEN /hpf (ref 0–?)

## 2015-05-20 NOTE — Progress Notes (Signed)
2:07 PM   Chanoch Fechter [birth year] [id number]  Referring provider: [name], MD [address] [place], [place] [address]  Chief Complaint  Patient presents with  . Results    CT Urogram    HPI: Patient is an 81 year old white male who presents for CT Urogram report.  He has not had any further episodes of gross hematuria since his last visit with [place]. His UA today is negative for hematuria.  Background story  Patient is an 81 year old white male who presented to [place] after an episode of passage of clots and gross hematuria.  He did not have any dysuria, suprapubic pain or further gross hematuria.  He has not had any fevers, chills, nausea or vomiting.  He does not have a history of stones.  He did undergo a hematuria work up in 2013 with a CT Urogram and cystoscopy.  He was found to have bilateral renal cysts.  His PSA had risen to 64 and a biopsy was performed at that time, as well.  He was found to have massive BPH with a 200 cc prostate, but no malignancy was found.  His most recent PSA was 5.3 ng/mL on 09/03/2014.    Today, the patient feels well. He is not having difficulty with urination.  CT urogram performed on 05/08/2015 noted bilateral nonobstructing renal stones with a 2 mm stone identified in the bladder lumen. He also had bilateral renal cysts. Marketed prostatomegaly.  Of note, an 11 mm subtle hyperattenuating lesion is seen in the lower pole of the left kidney.  A follow-up CT scan of the area is recommended in 3 months.  His right ureter did not completely opacify on the study.  I have reviewed the films with the patient.  PMH: Past Medical History  Diagnosis Date  . Hypertrophy (benign) of prostate     asymptomatic on finasteride. will f/up with urologist [name]  . Hyperlipidemia   . Hypertension   . Tubular adenoma of colon   . Obesity   . Elevated PSA   . BPH (benign prostatic hyperplasia)   . Urinary retention   . Gross  hematuria   . Duodenal mass   . Overweight     Surgical History: Past Surgical History  Procedure Laterality Date  . Cataracts    . Uvula surgery  1990    Home Medications:    Medication List       This list is accurate as of: 05/20/15  2:07 PM.  Always use your most recent med list.               aspirin EC 81 MG tablet  Take 1 tablet (81 mg total) by mouth daily.     atorvastatin 40 MG tablet  Commonly known as:  LIPITOR  Take 1 tablet (40 mg total) by mouth daily.     Calcium-Vitamin D 600-125 MG-UNIT Tabs  Take 1 tablet by mouth daily.     clopidogrel 75 MG tablet  Commonly known as:  PLAVIX  Take 1 tablet (75 mg total) by mouth daily.     docusate 50 MG/5ML liquid  Commonly known as:  COLACE  3 drops in each ear daily to prevent occlusion with wax     finasteride 5 MG tablet  Commonly known as:  PROSCAR  TAKE 1 TABLET BY MOUTH EVERY DAY     fish oil-omega-3 fatty acids 1000 MG capsule  Take 1 g by  mouth daily.     meloxicam 7.5 MG tablet  Commonly known as:  MOBIC  TAKE 1 TABLET (7.5 MG TOTAL) BY MOUTH DAILY.     memantine 10 MG tablet  Commonly known as:  NAMENDA  TAKE 1 TABLET (10 MG TOTAL) BY MOUTH 2 (TWO) TIMES DAILY.     multivitamin with minerals tablet  Take 1 tablet by mouth daily.     nebivolol 2.5 MG tablet  Commonly known as:  BYSTOLIC  Take 1 tablet (2.5 mg total) by mouth daily.     niacin 250 MG tablet  Take 250 mg by mouth daily.     predniSONE 10 MG tablet  Commonly known as:  DELTASONE  6 tablets on Day 1 , then reduce by 1 tablet daily until gone     traMADol 50 MG tablet  Commonly known as:  ULTRAM  Take 1 tablet (50 mg total) by mouth every 8 (eight) hours as needed.     VITAMIN D3 PO  Take 1 tablet by mouth daily.        Allergies: No Known Allergies  Family History: Family History  Problem Relation Age of Onset  . Heart disease Mother   . Heart disease Father   . Kidney disease Neg Hx   . Prostate  cancer Neg Hx   . Stroke Mother     Social History:  reports that he has never smoked. He has never used smokeless tobacco. He reports that he drinks alcohol. He reports that he does not use illicit drugs.  ROS: UROLOGY Frequent Urination?: No Hard to postpone urination?: No Burning/pain with urination?: No Get up at night to urinate?: No Leakage of urine?: No Urine stream starts and stops?: No Trouble starting stream?: No Do you have to strain to urinate?: No Blood in urine?: Yes Urinary tract infection?: No Sexually transmitted disease?: No Injury to kidneys or bladder?: No Painful intercourse?: No Weak stream?: No Erection problems?: No Penile pain?: No  Gastrointestinal Nausea?: No Vomiting?: No Indigestion/heartburn?: No Diarrhea?: No Constipation?: No  Constitutional Fever: No Night sweats?: No Weight loss?: No Fatigue?: No  Skin Skin rash/lesions?: No Itching?: No  Eyes Blurred vision?: No Double vision?: No  Ears/Nose/Throat Sore throat?: No Sinus problems?: No  Hematologic/Lymphatic Swollen glands?: No Easy bruising?: No  Cardiovascular Leg swelling?: No Chest pain?: No  Respiratory Cough?: No Shortness of breath?: No  Endocrine Excessive thirst?: No  Musculoskeletal Back pain?: No Joint pain?: No  Neurological Headaches?: No Dizziness?: No  Psychologic Depression?: No Anxiety?: No  Physical Exam: BP 166/65 mmHg  Pulse 65  Ht 5\' 8"  (1.727 m)  Wt 207 lb 11.2 oz (94.212 kg)  BMI 31.59 kg/m2  GU: No CVA tenderness.  No bladder fullness or masses.  Patient with uncircumcised phallus. Foreskin easily retracted*  Urethral meatus is patent.  No penile discharge. No penile lesions or rashes. Scrotum without lesions, cysts, rashes and/or edema.  Testicles are located scrotally bilaterally. No masses are appreciated in the testicles. Left and right epididymis are normal. Rectal: Patient with  normal sphincter tone. Anus and perineum  without scarring or rashes. No rectal masses are appreciated. Prostate is severely enlarged and the entire gland could not be palpated due to its size.    Laboratory Data: Lab Results  Component Value Date   WBC 5.6 02/25/2015   HGB 14.6 02/25/2015   HCT 43.6 02/25/2015   MCV 95.1 02/25/2015   PLT 179.0 02/25/2015    Lab Results  Component Value Date   CREATININE 1.00 04/30/2015     Urinalysis Results for orders placed or performed in visit on 05/20/15  Microscopic Examination  Result Value Ref Range   WBC, UA 6-10 (A) 0 -  5 /hpf   RBC, UA None seen 0 -  2 /hpf   Epithelial Cells (non renal) 0-10 0 - 10 /hpf   Mucus, UA Present (A) Not Estab.   Bacteria, UA Many (A) None seen/Few  Urinalysis, Complete  Result Value Ref Range   Specific Gravity, UA >1.030 (H) 1.005 - 1.030   pH, UA 5.0 5.0 - 7.5   Color, UA Amber (A) Yellow   Appearance Ur Clear Clear   Leukocytes, UA Negative Negative   Protein, UA Trace (A) Negative/Trace   Glucose, UA Negative Negative   Ketones, UA Negative Negative   RBC, UA Trace (A) Negative   Bilirubin, UA Negative Negative   Urobilinogen, Ur 0.2 0.2 - 1.0 mg/dL   Nitrite, UA Negative Negative   Microscopic Examination See below:     Pertinent imaging CLINICAL DATA: Initial encounter for ten-day history of gross hematuria.  EXAM: CT ABDOMEN AND PELVIS WITHOUT AND WITH CONTRAST  TECHNIQUE: Multidetector CT imaging of the abdomen and pelvis was performed following the standard protocol before and following the bolus administration of intravenous contrast.  CONTRAST: 15mL OMNIPAQUE IOHEXOL 300 MG/ML SOLN  COMPARISON: 03/07/2012.  FINDINGS: Lower chest: Unremarkable.  Hepatobiliary: Scattered tiny hypodensities in the liver parenchyma are stable and likely represent tiny cysts. Largest lesion is a subcapsular 18 mm lesion in the medial segment left liver, unchanged. There is no evidence for gallstones, gallbladder  wall thickening, or pericholecystic fluid. No intrahepatic or extrahepatic biliary dilation.  Pancreas: No focal mass lesion. No dilatation of the main duct. No intraparenchymal cyst. No peripancreatic edema.  Spleen: No splenomegaly. No focal mass lesion.  Adrenals/Urinary Tract: No adrenal nodule or mass.  Pre contrast imaging shows a 2 x 5 mm nonobstructing stone lower pole right kidney. 3 x 5 mm nonobstructing stone identified interpolar left kidney. No ureteral stones. No hydroureteronephrosis. 2 mm stone is identified in the posterior bladder lumen (image 72 series 2).  11 mm subtle hyper attenuating lesion is seen in the lower pole the left kidney (image 43 series 2) adjacent to the cyst. Subtle enhancement within this lesion cannot be excluded.  Imaging after IV contrast administration shows definite no enhancing lesion in either kidney. 8.2 cm exophytic cyst upper pole right kidney (7.0 cm on previous study) contains a thin posterior septation (Bosniak II). 4.8 cm simple cyst lower pole left kidney was 3.4 cm previously. 6.9 cm simple cyst upper pole left kidney was 5.7 cm previously. Other scattered tiny cortical hypodensities in the left kidney are too small to characterize but probably represent cysts. Delayed imaging shows no abnormality of either intrarenal collecting system or renal pelvis. Ureters are well opacified without focal hydroureter, intraluminal filling defect, or asymmetric wall thickening. No focal bladder wall abnormality is evident although assessment is limited by the large volume of under opacified urine in the bladder lumen. Bladder wall trabeculation suggests a component of underlying bladder outlet obstruction.  Stomach/Bowel: Small hiatal hernia. Stomach otherwise unremarkable. Diverticulum identified along the transverse duodenum. No small bowel wall thickening. No small bowel dilatation. The terminal ileum is normal. The appendix is  not visualized, but there is no edema or inflammation in the region of the cecum. Diverticular changes are noted in the left colon without evidence  of diverticulitis.  Vascular/Lymphatic: There is abdominal aortic atherosclerosis without aneurysm. There is no gastrohepatic or hepatoduodenal ligament lymphadenopathy. No intraperitoneal or retroperitoneal lymphadenopy. No pelvic sidewall lymphadenopathy.  Reproductive: Prostate gland is markedly enlarged.  Other: No intraperitoneal free fluid.  Musculoskeletal: Bone windows reveal no worrisome lytic or sclerotic osseous lesions.  IMPRESSION: 1. Bilateral nonobstructing renal stones with a 2 mm stone identified in the bladder lumen, suggesting recent stone passage. No substantial secondary changes in either kidney or ureter. 2. 11 mm subtle hyper attenuating lesion is seen in the lower pole the left kidney. Given the location and size of this lesion, it is difficult to definitively characterize although was not visible on the prior study. While likely a cyst complicated by proteinaceous debris or hemorrhage, solid lesion cannot be entirely excluded. Follow-up CT scan could be used to ensure stability in 3-6 months. 3. Bilateral renal cysts. 4. Marked prostatomegaly.   Electronically Signed  By: Misty Stanley M.D.  On: 05/08/2015 16:11  Assessment & Plan:    1. Gross hematuria:    Patient has completed a CT Urogram which has identified a recently passed stone, bilateral renal cysts and a subtle hyper attenuating lesion seen in the lower pole of the left kidney.  The right ureter did not completely opacify on the study.  I discussed with the patient cystoscopy with bilateral retrogrades and the risks involved.  He would like to defer this study until after the repeated CT Urogram in 3 months.  I discussed this with Dr. Pilar Jarvis and he agrees with the plan.  He will undergo a cystoscopy in the office at this time.  I explained  to the patient that the cystoscopy consists of passing a tube with a lens up through their urethra and into their urinary bladder.   We will inject the urethra with a lidocaine gel prior to introducing the cystoscope to help with any discomfort during the procedure.   After the procedure, they might experience blood in the urine and discomfort with urination.  This will abate after the first few voids.  I have  encouraged the patient to increase water intake  during this time.  Patient denies any allergies to lidocaine.   - Urinalysis, Complete  2. Left renal mass:   Patient will undergo another CT Urogram in three month for reevaluation of this area.  His right ureter did not completely opacify on the current study.  Hopefully, we will be able to capture it on the repeated study in three months.     Return for cystoscopy.  Zara Council, [facility] [address], [place], [place] [address] [phone]

## 2015-05-21 DIAGNOSIS — R2689 Other abnormalities of gait and mobility: Secondary | ICD-10-CM | POA: Diagnosis not present

## 2015-05-21 DIAGNOSIS — M6281 Muscle weakness (generalized): Secondary | ICD-10-CM | POA: Diagnosis not present

## 2015-05-21 DIAGNOSIS — R269 Unspecified abnormalities of gait and mobility: Secondary | ICD-10-CM | POA: Diagnosis not present

## 2015-05-22 DIAGNOSIS — R2689 Other abnormalities of gait and mobility: Secondary | ICD-10-CM | POA: Diagnosis not present

## 2015-05-22 DIAGNOSIS — M6281 Muscle weakness (generalized): Secondary | ICD-10-CM | POA: Diagnosis not present

## 2015-05-22 DIAGNOSIS — R269 Unspecified abnormalities of gait and mobility: Secondary | ICD-10-CM | POA: Diagnosis not present

## 2015-05-25 ENCOUNTER — Telehealth: Payer: Self-pay

## 2015-05-25 DIAGNOSIS — N390 Urinary tract infection, site not specified: Secondary | ICD-10-CM

## 2015-05-25 LAB — CULTURE, URINE COMPREHENSIVE

## 2015-05-25 MED ORDER — AMOXICILLIN 875 MG PO TABS: 875.0000 mg | ORAL_TABLET | Freq: Two times a day (BID) | ORAL | Status: AC

## 2015-05-25 NOTE — Telephone Encounter (Signed)
-----   Message from Nori Riis, PA-C sent at 05/25/2015  8:23 AM EST ----- Patient has a +UCx.  They need to start amoxicillin 875 mg one tablet twice daily for ten days.   He has a cystoscopy scheduled for the 14th, so he needs to stay on the antibiotic until that is completed.

## 2015-05-25 NOTE — Telephone Encounter (Signed)
SPOKE WITH PATIENT   Christopher Jenkins

## 2015-05-25 NOTE — Telephone Encounter (Signed)
LMOM-medication called into pharmacy 

## 2015-05-26 ENCOUNTER — Telehealth: Payer: Self-pay | Admitting: Internal Medicine

## 2015-05-26 DIAGNOSIS — M79604 Pain in right leg: Secondary | ICD-10-CM

## 2015-05-26 NOTE — Telephone Encounter (Signed)
Patient notified and voiced understanding.

## 2015-05-26 NOTE — Telephone Encounter (Signed)
Please advise this would be to hip area right?

## 2015-05-26 NOTE — Telephone Encounter (Signed)
Yes, but He will need an orthopedics referral for that I wil make referral to dr Mack Guise

## 2015-05-26 NOTE — Telephone Encounter (Signed)
Pt lvm on phone asking if he coould receive Cortizone shot d/t his bursitis. Please advise.

## 2015-05-27 ENCOUNTER — Telehealth: Payer: Self-pay

## 2015-05-27 NOTE — Telephone Encounter (Signed)
-----   Message from Nori Riis, PA-C sent at 05/25/2015  8:23 AM EST ----- Patient has a +UCx.  They need to start amoxicillin 875 mg one tablet twice daily for ten days.   He has a cystoscopy scheduled for the 14th, so he needs to stay on the antibiotic until that is completed.

## 2015-05-27 NOTE — Telephone Encounter (Signed)
See previous message

## 2015-06-02 DIAGNOSIS — M1611 Unilateral primary osteoarthritis, right hip: Secondary | ICD-10-CM | POA: Diagnosis not present

## 2015-06-03 ENCOUNTER — Ambulatory Visit (INDEPENDENT_AMBULATORY_CARE_PROVIDER_SITE_OTHER): Payer: Medicare Other | Admitting: Urology

## 2015-06-03 ENCOUNTER — Other Ambulatory Visit: Payer: Self-pay | Admitting: Orthopedic Surgery

## 2015-06-03 VITALS — BP 188/71 | HR 55 | Ht 68.0 in | Wt 210.3 lb

## 2015-06-03 DIAGNOSIS — R310 Gross hematuria: Secondary | ICD-10-CM

## 2015-06-03 DIAGNOSIS — M1611 Unilateral primary osteoarthritis, right hip: Secondary | ICD-10-CM

## 2015-06-03 LAB — URINALYSIS, COMPLETE
BILIRUBIN UA: NEGATIVE
Glucose, UA: NEGATIVE
KETONES UA: NEGATIVE
LEUKOCYTES UA: NEGATIVE
Nitrite, UA: NEGATIVE
PROTEIN UA: NEGATIVE
SPEC GRAV UA: 1.02 (ref 1.005–1.030)
Urobilinogen, Ur: 0.2 mg/dL (ref 0.2–1.0)
pH, UA: 7 (ref 5.0–7.5)

## 2015-06-03 LAB — MICROSCOPIC EXAMINATION
Bacteria, UA: NONE SEEN
Epithelial Cells (non renal): NONE SEEN /hpf (ref 0–10)

## 2015-06-03 MED ORDER — LIDOCAINE HCL 2 % EX GEL
1.0000 "application " | Freq: Once | CUTANEOUS | Status: AC
  Administered 2015-06-03: 1 via URETHRAL

## 2015-06-03 NOTE — Progress Notes (Signed)
06/03/2015 11:37 AM   Christopher Jenkins April 15, 1927 [id number]  Referring provider: [name], MD Christopher Jenkins,  [id number]  Chief Complaint  Patient presents with  . Cysto    HPI: Patient is an 81 year old white male who presented to [place] after an episode of passage of clots and gross hematuria. He did not have any dysuria, suprapubic pain or further gross hematuria. He has not had any fevers, chills, nausea or vomiting. He does not have a history of stones. He did undergo a hematuria work up in 2013 with a CT Urogram and cystoscopy. He was found to have bilateral renal cysts. His PSA had risen to 64 and a biopsy was performed at that time, as well. He was found to have massive BPH with a 200 cc prostate, but no malignancy was found. His most recent PSA was 5.3 ng/mL on 09/03/2014.   Today, the patient feels well. He is not having difficulty with urination. CT urogram performed on 05/08/2015 noted bilateral nonobstructing renal stones with a 2 mm stone identified in the bladder lumen. He also had bilateral renal cysts. Marketed prostatomegaly. Of note, an 11 mm subtle hyperattenuating lesion is seen in the lower pole of the left kidney. A follow-up CT scan of the area is recommended in 3 months.  Interval history: The patient presents for cystoscopy to complete his hematuria workup.    PMH: Past Medical History  Diagnosis Date  . Hypertrophy (benign) of prostate     asymptomatic on finasteride. will f/up with urologist Christopher Jenkins  . Hyperlipidemia   . Hypertension   . Tubular adenoma of colon   . Obesity   . Elevated PSA   . BPH (benign prostatic hyperplasia)   . Urinary retention   . Gross hematuria   . Duodenal mass   . Overweight     Surgical History: Past Surgical History  Procedure Laterality Date  . Cataracts    . Uvula surgery  1990    Home Medications:    Medication List       This list is accurate as of:  06/03/15 11:37 AM.  Always use your most recent med list.               amoxicillin 875 MG tablet  Commonly known as:  AMOXIL  Take 1 tablet (875 mg total) by mouth 2 (two) times daily.     aspirin EC 81 MG tablet  Take 1 tablet (81 mg total) by mouth daily.     atorvastatin 40 MG tablet  Commonly known as:  LIPITOR  Take 1 tablet (40 mg total) by mouth daily.     Calcium-Vitamin D 600-125 MG-UNIT Tabs  Take 1 tablet by mouth daily.     clopidogrel 75 MG tablet  Commonly known as:  PLAVIX  Take 1 tablet (75 mg total) by mouth daily.     docusate 50 MG/5ML liquid  Commonly known as:  COLACE  3 drops in each ear daily to prevent occlusion with wax     finasteride 5 MG tablet  Commonly known as:  PROSCAR  TAKE 1 TABLET BY MOUTH EVERY DAY     fish oil-omega-3 fatty acids 1000 MG capsule  Take 1 g by mouth daily.     meloxicam 7.5 MG tablet  Commonly known as:  MOBIC  TAKE 1 TABLET (7.5 MG TOTAL) BY MOUTH DAILY.     memantine 10 MG tablet  Commonly known as:  NAMENDA  TAKE 1 TABLET (10 MG TOTAL) BY MOUTH 2 (TWO) TIMES DAILY.     multivitamin with minerals tablet  Take 1 tablet by mouth daily.     nebivolol 2.5 MG tablet  Commonly known as:  BYSTOLIC  Take 1 tablet (2.5 mg total) by mouth daily.     niacin 250 MG tablet  Take 250 mg by mouth daily.     predniSONE 10 MG tablet  Commonly known as:  DELTASONE  6 tablets on Day 1 , then reduce by 1 tablet daily until gone     traMADol 50 MG tablet  Commonly known as:  ULTRAM  Take 1 tablet (50 mg total) by mouth every 8 (eight) hours as needed.     VITAMIN D3 PO  Take 1 tablet by mouth daily.        Allergies: No Known Allergies  Family History: Family History  Problem Relation Age of Onset  . Heart disease Mother   . Heart disease Father   . Kidney disease Neg Hx   . Prostate cancer Neg Hx   . Stroke Mother     Social History:  reports that he has never smoked. He has never used smokeless tobacco.  He reports that he drinks alcohol. He reports that he does not use illicit drugs.  ROS:                                        Physical Exam: BP 188/71 mmHg  Pulse 55  Ht 5\' 8"  (1.727 m)  Wt 210 lb 4.8 oz (95.391 kg)  BMI 31.98 kg/m2  Constitutional:  Alert and oriented, No acute distress. HEENT: [place] AT, moist mucus membranes.  Trachea midline, no masses. Cardiovascular: No clubbing, cyanosis, or edema. Respiratory: Normal respiratory effort, no increased work of breathing. GI: Abdomen is soft, nontender, nondistended, no abdominal masses GU: No CVA tenderness.  Skin: No rashes, bruises or suspicious lesions. Lymph: No cervical or inguinal adenopathy. Neurologic: Grossly intact, no focal deficits, moving all 4 extremities. Psychiatric: Normal mood and affect.  Laboratory Data: Lab Results  Component Value Date   WBC 5.6 02/25/2015   HGB 14.6 02/25/2015   HCT 43.6 02/25/2015   MCV 95.1 02/25/2015   PLT 179.0 02/25/2015    Lab Results  Component Value Date   CREATININE 1.00 04/30/2015    No results found for: PSA  No results found for: TESTOSTERONE  No results found for: HGBA1C  Urinalysis    Component Value Date/Time   COLORURINE YELLOW 12/05/2012 [name] 12/05/2012 1416   LABSPEC >=1.030 12/05/2012 1416   PHURINE 6.0 12/05/2012 1416   GLUCOSEU Negative 05/20/2015 [name] 12/05/2012 1416   [place] 12/05/2012 1416   BILIRUBINUR Negative 05/20/2015 1339   BILIRUBINUR NEGATIVE 12/05/2012 1416   BILIRUBINUR neg 02/08/2012 [name] 12/05/2012 1416   PROTEINUR 30 mg/dL 02/08/2012 0953   UROBILINOGEN 0.2 12/05/2012 1416   UROBILINOGEN 0.2 02/08/2012 0953   NITRITE Negative 05/20/2015 1339   NITRITE NEGATIVE 12/05/2012 1416   NITRITE neg 02/08/2012 0953   LEUKOCYTESUR Negative 05/20/2015 1339   LEUKOCYTESUR NEGATIVE 12/05/2012 1416    Pertinent Imaging:  CLINICAL DATA:  Initial encounter for ten-day history of gross hematuria.  EXAM: CT ABDOMEN AND PELVIS WITHOUT AND WITH CONTRAST  TECHNIQUE: Multidetector CT imaging of the abdomen and pelvis was  performed following the standard protocol before and following the bolus administration of intravenous contrast.  CONTRAST: 153mL OMNIPAQUE IOHEXOL 300 MG/ML SOLN  COMPARISON: 03/07/2012.  FINDINGS: Lower chest: Unremarkable.  Hepatobiliary: Scattered tiny hypodensities in the liver parenchyma are stable and likely represent tiny cysts. Largest lesion is a subcapsular 18 mm lesion in the medial segment left liver, unchanged. There is no evidence for gallstones, gallbladder wall thickening, or pericholecystic fluid. No intrahepatic or extrahepatic biliary dilation.  Pancreas: No focal mass lesion. No dilatation of the main duct. No intraparenchymal cyst. No peripancreatic edema.  Spleen: No splenomegaly. No focal mass lesion.  Adrenals/Urinary Tract: No adrenal nodule or mass.  Pre contrast imaging shows a 2 x 5 mm nonobstructing stone lower pole right kidney. 3 x 5 mm nonobstructing stone identified interpolar left kidney. No ureteral stones. No hydroureteronephrosis. 2 mm stone is identified in the posterior bladder lumen (image 72 series 2).  11 mm subtle hyper attenuating lesion is seen in the lower pole the left kidney (image 43 series 2) adjacent to the cyst. Subtle enhancement within this lesion cannot be excluded.  Imaging after IV contrast administration shows definite no enhancing lesion in either kidney. 8.2 cm exophytic cyst upper pole right kidney (7.0 cm on previous study) contains a thin posterior septation (Bosniak II). 4.8 cm simple cyst lower pole left kidney was 3.4 cm previously. 6.9 cm simple cyst upper pole left kidney was 5.7 cm previously. Other scattered tiny cortical hypodensities in the left kidney are too small to characterize but probably  represent cysts. Delayed imaging shows no abnormality of either intrarenal collecting system or renal pelvis. Ureters are well opacified without focal hydroureter, intraluminal filling defect, or asymmetric wall thickening. No focal bladder wall abnormality is evident although assessment is limited by the large volume of under opacified urine in the bladder lumen. Bladder wall trabeculation suggests a component of underlying bladder outlet obstruction.  Stomach/Bowel: Small hiatal hernia. Stomach otherwise unremarkable. Diverticulum identified along the transverse duodenum. No small bowel wall thickening. No small bowel dilatation. The terminal ileum is normal. The appendix is not visualized, but there is no edema or inflammation in the region of the cecum. Diverticular changes are noted in the left colon without evidence of diverticulitis.  Vascular/Lymphatic: There is abdominal aortic atherosclerosis without aneurysm. There is no gastrohepatic or hepatoduodenal ligament lymphadenopathy. No intraperitoneal or retroperitoneal lymphadenopy. No pelvic sidewall lymphadenopathy.  Reproductive: Prostate gland is markedly enlarged.  Other: No intraperitoneal free fluid.  Musculoskeletal: Bone windows reveal no worrisome lytic or sclerotic osseous lesions.  IMPRESSION: 1. Bilateral nonobstructing renal stones with a 2 mm stone identified in the bladder lumen, suggesting recent stone passage. No substantial secondary changes in either kidney or ureter. 2. 11 mm subtle hyper attenuating lesion is seen in the lower pole the left kidney. Given the location and size of this lesion, it is difficult to definitively characterize although was not visible on the prior study. While likely a cyst complicated by proteinaceous debris or hemorrhage, solid lesion cannot be entirely excluded. Follow-up CT scan could be used to ensure stability in 3-6 months. 3. Bilateral renal cysts. 4. Marked  prostatomegaly.     Cystoscopy Procedure Note  Patient identification was confirmed, informed consent was obtained, and patient was prepped using Betadine solution.  Lidocaine jelly was administered per urethral meatus.    Preoperative abx where received prior to procedure.     Pre-Procedure: - Inspection reveals a normal caliber ureteral meatus.  Procedure: The flexible cystoscope  was introduced without difficulty - No urethral strictures/lesions are present. - Enlarged prostate 7 cm - Tight bladder neck - Bilateral ureteral orifices identified - Bladder mucosa  reveals no ulcers, tumors, or lesions - No bladder stones - No trabeculation   Post-Procedure: - Patient tolerated the procedure well   Assessment & Plan:    1. Gross Hematuria The patient's hematuria workup was negative. He will need a repeat urinalysis in one year.  2. Left hyperattenuating renal mass -We'll plan to repeat CT scan in 3 months to ensure that it is stable  3. Incomplete opacification of right ureter and CT urogram -We'll reassess at the time of the planned follow-up CT scan in 3 months  4. BPH -continue finasteride   Return in about 3 months (around 09/01/2015) for with CT Urogram prior.  Nickie Retort, MD  [facility] [address], [place], [place] [address] [phone]

## 2015-06-09 ENCOUNTER — Encounter: Payer: Self-pay | Admitting: Internal Medicine

## 2015-06-09 ENCOUNTER — Ambulatory Visit (INDEPENDENT_AMBULATORY_CARE_PROVIDER_SITE_OTHER): Payer: Medicare Other | Admitting: Internal Medicine

## 2015-06-09 VITALS — BP 158/78 | HR 61 | Temp 97.9°F | Resp 12 | Ht 68.0 in | Wt 213.5 lb

## 2015-06-09 DIAGNOSIS — G8929 Other chronic pain: Secondary | ICD-10-CM

## 2015-06-09 DIAGNOSIS — M25551 Pain in right hip: Secondary | ICD-10-CM

## 2015-06-09 MED ORDER — HYDROCHLOROTHIAZIDE 25 MG PO TABS: 25.0000 mg | ORAL_TABLET | Freq: Every day | ORAL | Status: DC

## 2015-06-09 NOTE — Progress Notes (Signed)
Subjective:  Patient ID: Christopher Jenkins, male    DOB: [birth year]  Age: 81 y.o. MRN: [id number]  CC: The encounter diagnosis was Chronic right hip pain.  HPI Christopher Jenkins presents for discussion of  Hip injection planned for tomorrow by Christopher Jenkins.  Patient cannot remember if he actually saw Christopher Jenkins, was not told to stop Plavix.  The injection is planned for the right hip which has been hurting for over a month. There is no history of fall or other trauma. The pain is aggravated by standing up,  Climbing stairs and bending at the waist.   Yesterday felt very tired and sleepy after eating breakfast,  Took a 1-2 hour nap ,  Then after Lunch  Had his normal 2 hr afternoon nap,  And then after eating dinner went to be at the regular time  .  Eyes were burning prior to the onset of drowsiness      Outpatient Prescriptions Prior to Visit  Medication Sig Dispense Refill  . aspirin EC 81 MG tablet Take 1 tablet (81 mg total) by mouth daily. 90 tablet 1  . atorvastatin (LIPITOR) 40 MG tablet Take 1 tablet (40 mg total) by mouth daily. 90 tablet 3  . Calcium Carbonate-Vitamin D (CALCIUM-VITAMIN D) 600-125 MG-UNIT TABS Take 1 tablet by mouth daily.    . Cholecalciferol (VITAMIN D3 PO) Take 1 tablet by mouth daily.      . clopidogrel (PLAVIX) 75 MG tablet Take 1 tablet (75 mg total) by mouth daily. 90 tablet 3  . docusate (COLACE) 50 MG/5ML liquid 3 drops in each ear daily to prevent occlusion with wax 100 mL 0  . finasteride (PROSCAR) 5 MG tablet TAKE 1 TABLET BY MOUTH EVERY DAY 90 tablet 3  . fish oil-omega-3 fatty acids 1000 MG capsule Take 1 g by mouth daily.      . meloxicam (MOBIC) 7.5 MG tablet TAKE 1 TABLET (7.5 MG TOTAL) BY MOUTH DAILY. 30 tablet 1  . memantine (NAMENDA) 10 MG tablet TAKE 1 TABLET (10 MG TOTAL) BY MOUTH 2 (TWO) TIMES DAILY. 60 tablet 2  . Multiple Vitamins-Minerals (MULTIVITAMIN WITH MINERALS) tablet Take 1 tablet by mouth daily.    . nebivolol  (BYSTOLIC) 2.5 MG tablet Take 1 tablet (2.5 mg total) by mouth daily. 30 tablet 3  . niacin 250 MG tablet Take 250 mg by mouth daily.      . predniSONE (DELTASONE) 10 MG tablet 6 tablets on Day 1 , then reduce by 1 tablet daily until gone 21 tablet 0  . traMADol (ULTRAM) 50 MG tablet Take 1 tablet (50 mg total) by mouth every 8 (eight) hours as needed. 90 tablet 0   No facility-administered medications prior to visit.    Review of Systems;  Patient denies headache, fevers, malaise, unintentional weight loss, skin rash, eye pain, sinus congestion and sinus pain, sore throat, dysphagia,  hemoptysis , cough, dyspnea, wheezing, chest pain, palpitations, orthopnea, edema, abdominal pain, nausea, melena, diarrhea, constipation, flank pain, dysuria, hematuria, urinary  Frequency, nocturia, numbness, tingling, seizures,  Focal weakness, Loss of consciousness,  Tremor, insomnia, depression, anxiety, and suicidal ideation.      Objective:  BP 158/78 mmHg  Pulse 61  Temp(Src) 97.9 F (36.6 C) (Oral)  Resp 12  Ht 5\' 8"  (1.727 m)  Wt 213 lb 8 oz (96.843 kg)  BMI 32.47 kg/m2  SpO2 96%  BP Readings from Last 3 Encounters:  06/09/15 158/78  06/03/15 188/71  05/20/15 166/65  Wt Readings from Last 3 Encounters:  06/10/15 207 lb (93.895 kg)  06/09/15 213 lb 8 oz (96.843 kg)  06/03/15 210 lb 4.8 oz (95.391 kg)    General appearance: alert, cooperative and appears stated age Ears: normal TM's and external ear canals both ears Throat: lips, mucosa, and tongue normal; teeth and gums normal Neck: no adenopathy, no carotid bruit, supple, symmetrical, trachea midline and thyroid not enlarged, symmetric, no tenderness/mass/nodules Back: symmetric, no curvature. ROM normal. No CVA tenderness. Lungs: clear to auscultation bilaterally Heart: regular rate and rhythm, S1, S2 normal, no murmur, click, rub or gallop Abdomen: soft, non-tender; bowel sounds normal; no masses,  no organomegaly Pulses: 2+  and symmetric Skin: Skin color, texture, turgor normal. No rashes or lesions Lymph nodes: Cervical, supraclavicular, and axillary nodes normal.  No results found for: HGBA1C  Lab Results  Component Value Date   CREATININE 1.00 04/30/2015   CREATININE 0.81 02/25/2015   CREATININE 0.9 05/08/2014    Lab Results  Component Value Date   WBC 5.6 02/25/2015   HGB 14.6 02/25/2015   HCT 43.6 02/25/2015   PLT 179.0 02/25/2015   GLUCOSE 95 02/25/2015   CHOL 169 02/25/2015   TRIG 68.0 02/25/2015   HDL 51.30 02/25/2015   LDLDIRECT 125.1 01/06/2014   LDLCALC 104* 02/25/2015   ALT 15 02/25/2015   AST 25 02/25/2015   NA 140 02/25/2015   K 4.4 02/25/2015   CL 106 02/25/2015   CREATININE 1.00 04/30/2015   BUN 24 04/30/2015   CO2 26 02/25/2015   TSH 1.74 02/25/2015    Ct Abdomen Pelvis W Wo Contrast  05/08/2015  CLINICAL DATA:  Initial encounter for ten-day history of gross hematuria. EXAM: CT ABDOMEN AND PELVIS WITHOUT AND WITH CONTRAST TECHNIQUE: Multidetector CT imaging of the abdomen and pelvis was performed following the standard protocol before and following the bolus administration of intravenous contrast. CONTRAST:  140mL OMNIPAQUE IOHEXOL 300 MG/ML  SOLN COMPARISON:  03/07/2012. FINDINGS: Lower chest:  Unremarkable. Hepatobiliary: Scattered tiny hypodensities in the liver parenchyma are stable and likely represent tiny cysts. Largest lesion is a subcapsular 18 mm lesion in the medial segment left liver, unchanged. There is no evidence for gallstones, gallbladder wall thickening, or pericholecystic fluid. No intrahepatic or extrahepatic biliary dilation. Pancreas: No focal mass lesion. No dilatation of the main duct. No intraparenchymal cyst. No peripancreatic edema. Spleen: No splenomegaly. No focal mass lesion. Adrenals/Urinary Tract: No adrenal nodule or mass. Pre contrast imaging shows a 2 x 5 mm nonobstructing stone lower pole right kidney. 3 x 5 mm nonobstructing stone identified  interpolar left kidney. No ureteral stones. No hydroureteronephrosis. 2 mm stone is identified in the posterior bladder lumen (image 72 series 2). 11 mm subtle hyper attenuating lesion is seen in the lower pole the left kidney (image 43 series 2) adjacent to the cyst. Subtle enhancement within this lesion cannot be excluded. Imaging after IV contrast administration shows definite no enhancing lesion in either kidney. 8.2 cm exophytic cyst upper pole right kidney (7.0 cm on previous study) contains a thin posterior septation (Bosniak II). 4.8 cm simple cyst lower pole left kidney was 3.4 cm previously. 6.9 cm simple cyst upper pole left kidney was 5.7 cm previously. Other scattered tiny cortical hypodensities in the left kidney are too small to characterize but probably represent cysts. Delayed imaging shows no abnormality of either intrarenal collecting system or renal pelvis. Ureters are well opacified without focal hydroureter, intraluminal filling defect, or asymmetric wall thickening. No  focal bladder wall abnormality is evident although assessment is limited by the large volume of under opacified urine in the bladder lumen. Bladder wall trabeculation suggests a component of underlying bladder outlet obstruction. Stomach/Bowel: Small hiatal hernia. Stomach otherwise unremarkable. Diverticulum identified along the transverse duodenum. No small bowel wall thickening. No small bowel dilatation. The terminal ileum is normal. The appendix is not visualized, but there is no edema or inflammation in the region of the cecum. Diverticular changes are noted in the left colon without evidence of diverticulitis. Vascular/Lymphatic: There is abdominal aortic atherosclerosis without aneurysm. There is no gastrohepatic or hepatoduodenal ligament lymphadenopathy. No intraperitoneal or retroperitoneal lymphadenopy. No pelvic sidewall lymphadenopathy. Reproductive: Prostate gland is markedly enlarged. Other: No intraperitoneal  free fluid. Musculoskeletal: Bone windows reveal no worrisome lytic or sclerotic osseous lesions. IMPRESSION: 1. Bilateral nonobstructing renal stones with a 2 mm stone identified in the bladder lumen, suggesting recent stone passage. No substantial secondary changes in either kidney or ureter. 2. 11 mm subtle hyper attenuating lesion is seen in the lower pole the left kidney. Given the location and size of this lesion, it is difficult to definitively characterize although was not visible on the prior study. While likely a cyst complicated by proteinaceous debris or hemorrhage, solid lesion cannot be entirely excluded. Follow-up CT scan could be used to ensure stability in 3-6 months. 3. Bilateral renal cysts. 4. Marked prostatomegaly. Electronically Signed   By: Misty Stanley M.D.   On: 05/08/2015 16:11    Assessment & Plan:   Problem List Items Addressed This Visit    Chronic right hip pain - Primary    He is scheduled for an intraarticular injection but has not been told to suspend his Plavix. [facility] contacted,  Procedure delayed until Dec 27.  Patient's pain controlled with tylenol.         A total of 25 minutes of face to face time was spent with patient more than half of which was spent in counselling about the above mentioned conditions  and coordination of care with [facility] office.  I am having Christopher Jenkins start on hydrochlorothiazide. I am also having him maintain his niacin, fish oil-omega-3 fatty acids, Cholecalciferol (VITAMIN D3 PO), multivitamin with minerals, Calcium-Vitamin D, nebivolol, docusate, aspirin EC, atorvastatin, clopidogrel, finasteride, traMADol, predniSONE, memantine, and meloxicam.  Meds ordered this encounter  Medications  . hydrochlorothiazide (HYDRODIURIL) 25 MG tablet    Sig: Take 1 tablet (25 mg total) by mouth daily.    Dispense:  90 tablet    Refill:  3    There are no discontinued medications.  Follow-up: Return in about 3 months (around  09/07/2015).   [name], MD

## 2015-06-09 NOTE — Patient Instructions (Addendum)
Your sleepiness may be due to a medication  Please ask [name] if you have been taking tramadol ,  The pain medication I prescribed in October for your hip.  It may b e causing drowsiness   Ask [name] to tell  Dr Alfonso Ramus office in the AM  That you have been taking Plavix and aspirin   I am adding HCTZ for your blood pressure , . Take it daily in the morning   +

## 2015-06-10 ENCOUNTER — Telehealth: Payer: Self-pay | Admitting: Internal Medicine

## 2015-06-10 ENCOUNTER — Ambulatory Visit
Admission: RE | Admit: 2015-06-10 | Discharge: 2015-06-10 | Disposition: A | Discharge status: Home / Self Care | Payer: Medicare Other | Source: Ambulatory Visit | Attending: Orthopedic Surgery | Admitting: Orthopedic Surgery

## 2015-06-10 DIAGNOSIS — M1611 Unilateral primary osteoarthritis, right hip: Secondary | ICD-10-CM

## 2015-06-10 NOTE — Telephone Encounter (Signed)
Caryl Pina from Dr. Harden Mo office called they rescheduled the pt appointment for today. She does need written approval for the pt to be off of plavix for 5 day faxed to [phone].

## 2015-06-10 NOTE — Telephone Encounter (Signed)
Patient needs to stop Plavix 5 days prior to injection patient is being rescheduled by ortho to 06/16/15. Placed medical clearance for ortho in red folder.

## 2015-06-10 NOTE — Telephone Encounter (Signed)
Patient has stopped plavix and he says tylenol is controlling his pain.

## 2015-06-10 NOTE — Telephone Encounter (Signed)
Please call patient and wife [name] to see if he needs anything for pian during the interim and to confirm that jean has removed the Plavix from his pill box

## 2015-06-10 NOTE — Telephone Encounter (Signed)
Thank you :)

## 2015-06-11 NOTE — Assessment & Plan Note (Signed)
He is scheduled for an intraarticular injection but has not been told to suspend his Plavix. [facility] contacted,  Procedure delayed until Dec 27.  Patient's pain controlled with tylenol.

## 2015-06-16 ENCOUNTER — Ambulatory Visit
Admission: RE | Admit: 2015-06-16 | Discharge: 2015-06-16 | Disposition: A | Discharge status: Home / Self Care | Payer: Medicare Other | Source: Ambulatory Visit | Attending: Orthopedic Surgery | Admitting: Orthopedic Surgery

## 2015-06-16 DIAGNOSIS — M1611 Unilateral primary osteoarthritis, right hip: Secondary | ICD-10-CM | POA: Diagnosis not present

## 2015-06-16 DIAGNOSIS — M25551 Pain in right hip: Secondary | ICD-10-CM | POA: Diagnosis not present

## 2015-06-16 DIAGNOSIS — G8929 Other chronic pain: Secondary | ICD-10-CM | POA: Diagnosis not present

## 2015-06-16 LAB — APTT: aPTT: 28 seconds (ref 24–36)

## 2015-06-16 LAB — CBC
HCT: 42.8 % (ref 40.0–52.0)
Hemoglobin: 14.6 g/dL (ref 13.0–18.0)
MCH: 32.2 pg (ref 26.0–34.0)
MCHC: 34.1 g/dL (ref 32.0–36.0)
MCV: 94.6 fL (ref 80.0–100.0)
Platelets: 173 10*3/uL (ref 150–440)
RBC: 4.53 MIL/uL (ref 4.40–5.90)
RDW: 13.2 % (ref 11.5–14.5)
WBC: 6.8 10*3/uL (ref 3.8–10.6)

## 2015-06-16 LAB — PROTIME-INR
INR: 0.97
Prothrombin Time: 13.1 seconds (ref 11.4–15.0)

## 2015-06-16 MED ORDER — IOHEXOL 180 MG/ML  SOLN: 10.0000 mL | Freq: Once | INTRAMUSCULAR | Status: DC | PRN

## 2015-06-18 NOTE — Telephone Encounter (Signed)
Pt lvm on general mail box needing a call back about his appointments. He needs to know the function of all of his rx. He states that it is getting overwhelming. Please cb

## 2015-06-18 NOTE — Telephone Encounter (Signed)
Returned call to patient he stated he called 2 days ago and could not get through to the office, advised patient that he should use the triage line. Patient could not remember exactly what was needed and stated he would call back.

## 2015-07-01 ENCOUNTER — Other Ambulatory Visit: Payer: Self-pay

## 2015-07-01 MED ORDER — PANTOPRAZOLE SODIUM 40 MG PO TBEC: 40.0000 mg | DELAYED_RELEASE_TABLET | Freq: Every day | ORAL | Status: DC

## 2015-07-06 DIAGNOSIS — M1611 Unilateral primary osteoarthritis, right hip: Secondary | ICD-10-CM | POA: Diagnosis not present

## 2015-07-07 ENCOUNTER — Other Ambulatory Visit: Payer: Self-pay | Admitting: Internal Medicine

## 2015-07-08 ENCOUNTER — Telehealth: Payer: Self-pay | Admitting: Internal Medicine

## 2015-07-08 NOTE — Telephone Encounter (Signed)
Please advise for referral.  Thanks

## 2015-07-08 NOTE — Telephone Encounter (Signed)
Yes, I agree 

## 2015-07-08 NOTE — Telephone Encounter (Signed)
Pt Christopher Jenkins stating that he has agreed to have hip replacement with Dr. Mack Guise at [facility] (originally [facility]). He wants to let Dr. Derrel Nip know and would like to know if you agree to this. He wants your opinion. Please advise.

## 2015-07-09 NOTE — Telephone Encounter (Signed)
Spoke with the patient

## 2015-07-13 ENCOUNTER — Telehealth: Payer: Self-pay | Admitting: Urology

## 2015-07-13 DIAGNOSIS — N281 Cyst of kidney, acquired: Secondary | ICD-10-CM

## 2015-07-13 NOTE — Telephone Encounter (Signed)
He has a mass in his kidney and that is why we needed to repeat the CAT scan.  It had nothing to do with having blood in the urine.  Cancers can bleed off and on indefinitely.  If the patient realizes the risk of having a kidney cancer that is left untreated, we will see him in one year. Otherwise, I suggest he undergo the CT scan.

## 2015-07-13 NOTE — Telephone Encounter (Signed)
Mr. Barela called and expressed that he did not want to have the CT scan that he only had one episode of hematuria months ago and none since then. He has seen his PCP several times since then and has had clean urine test since his visit with [place]. So he has cancelled the CT scan and his follow up appointment for results and said he will see you for his yearly appt.  Thanks, Sharyn Lull

## 2015-07-14 ENCOUNTER — Telehealth: Payer: Self-pay | Admitting: Internal Medicine

## 2015-07-14 NOTE — Telephone Encounter (Signed)
No answer

## 2015-07-15 NOTE — Telephone Encounter (Signed)
Ok to refill,  Refill sent  

## 2015-07-15 NOTE — Telephone Encounter (Signed)
OK to fill

## 2015-07-16 NOTE — Telephone Encounter (Signed)
Yes, I do  Sharyn Lull

## 2015-07-16 NOTE — Telephone Encounter (Signed)
Spoke with pt and wife in reference to CT scan. Made aware of Shannon's dictation. Pt and wife voiced understanding and agreed to have CT scan.  Sharyn Lull do you need orders?

## 2015-07-16 NOTE — Telephone Encounter (Signed)
Patient notified and voiced understanding.

## 2015-07-16 NOTE — Telephone Encounter (Signed)
Orders placed.

## 2015-07-16 NOTE — Telephone Encounter (Signed)
I called pt to let him know of his refill of [facility]. He states that he received a call from his urologist and stated that he has a growth on his kidney. He is scheduled for his total hip replacement with Dr. Donney Rankins on Feb. 23. His question is-is this serious enough to let Dr. Mack Guise know? Would this possibly post pone his surgery? He said that his urologist told him that Dr. Mack Guise would not need to know but he wants Dr. Lupita Dawn opinion.

## 2015-07-16 NOTE — Telephone Encounter (Signed)
I agree,  This finding  should not postpone his hip surgery

## 2015-07-16 NOTE — Telephone Encounter (Signed)
Note from [facility] state as follows, "Of note, an 11 mm subtle hyperattenuating lesion is seen in the lower pole of the left kidney. A follow-up CT scan of the area is recommended in 3 months. His right ureter did not completely opacify on the study." note from 05/20/15.

## 2015-07-20 ENCOUNTER — Other Ambulatory Visit: Payer: Self-pay | Admitting: Orthopedic Surgery

## 2015-07-21 ENCOUNTER — Telehealth: Payer: Self-pay | Admitting: Radiology

## 2015-07-21 NOTE — Telephone Encounter (Signed)
The CT scan isn't a surgical procedure.  It is a scan of his abdomen and pelvis.

## 2015-07-21 NOTE — Telephone Encounter (Signed)
Pt asks, regarding the CT Abdomen & Pelvis WWO, will he require surgery?  He asks to have [facility] call him back before 4:30 at [phone].

## 2015-07-22 DIAGNOSIS — N289 Disorder of kidney and ureter, unspecified: Secondary | ICD-10-CM

## 2015-07-22 DIAGNOSIS — D3132 Benign neoplasm of left choroid: Secondary | ICD-10-CM | POA: Diagnosis not present

## 2015-07-22 HISTORY — DX: Disorder of kidney and ureter, unspecified: N28.9

## 2015-07-22 NOTE — Telephone Encounter (Signed)
Pt notified that plan of care would be determined after we have reviewed the CT results. Pt voices understanding.

## 2015-07-29 ENCOUNTER — Encounter
Admission: RE | Admit: 2015-07-29 | Discharge: 2015-07-29 | Disposition: A | Discharge status: Home / Self Care | Payer: Medicare Other | Source: Ambulatory Visit | Attending: Orthopedic Surgery | Admitting: Orthopedic Surgery

## 2015-07-29 ENCOUNTER — Ambulatory Visit
Admission: RE | Admit: 2015-07-29 | Discharge: 2015-07-29 | Disposition: A | Discharge status: Home / Self Care | Payer: Medicare Other | Source: Ambulatory Visit | Attending: Orthopedic Surgery | Admitting: Orthopedic Surgery

## 2015-07-29 ENCOUNTER — Ambulatory Visit: Admission: RE | Admit: 2015-07-29 | Payer: Self-pay | Source: Ambulatory Visit | Admitting: Orthopedic Surgery

## 2015-07-29 DIAGNOSIS — M25551 Pain in right hip: Secondary | ICD-10-CM | POA: Diagnosis not present

## 2015-07-29 DIAGNOSIS — Z01818 Encounter for other preprocedural examination: Secondary | ICD-10-CM | POA: Diagnosis not present

## 2015-07-29 DIAGNOSIS — Z01811 Encounter for preprocedural respiratory examination: Secondary | ICD-10-CM

## 2015-07-29 DIAGNOSIS — J449 Chronic obstructive pulmonary disease, unspecified: Secondary | ICD-10-CM | POA: Diagnosis not present

## 2015-07-29 HISTORY — DX: Disorder of kidney and ureter, unspecified: N28.9

## 2015-07-29 LAB — DIFFERENTIAL
Basophils Absolute: 0 10*3/uL (ref 0–0.1)
Basophils Relative: 1 %
Eosinophils Absolute: 0.1 10*3/uL (ref 0–0.7)
Eosinophils Relative: 1 %
Lymphocytes Relative: 15 %
Lymphs Abs: 1.4 10*3/uL (ref 1.0–3.6)
Monocytes Absolute: 0.7 10*3/uL (ref 0.2–1.0)
Monocytes Relative: 7 %
Neutro Abs: 7.2 10*3/uL — ABNORMAL HIGH (ref 1.4–6.5)
Neutrophils Relative %: 76 %

## 2015-07-29 LAB — COMPREHENSIVE METABOLIC PANEL
ALT: 20 U/L (ref 17–63)
AST: 29 U/L (ref 15–41)
Albumin: 4.1 g/dL (ref 3.5–5.0)
Alkaline Phosphatase: 163 U/L — ABNORMAL HIGH (ref 38–126)
Anion gap: 5 (ref 5–15)
BUN: 24 mg/dL — AB (ref 6–20)
CHLORIDE: 104 mmol/L (ref 101–111)
CO2: 30 mmol/L (ref 22–32)
CREATININE: 0.81 mg/dL (ref 0.61–1.24)
Calcium: 9.3 mg/dL (ref 8.9–10.3)
GFR calc Af Amer: >60 mL/min (ref >60)
GFR calc non Af Amer: >60 mL/min (ref >60)
Glucose, Bld: 93 mg/dL (ref 65–99)
Potassium: 4.1 mmol/L (ref 3.5–5.1)
SODIUM: 139 mmol/L (ref 135–145)
Total Bilirubin: 1 mg/dL (ref 0.3–1.2)
Total Protein: 6.8 g/dL (ref 6.5–8.1)

## 2015-07-29 LAB — URINALYSIS COMPLETE WITH MICROSCOPIC (ARMC ONLY)
Bilirubin Urine: NEGATIVE
Glucose, UA: NEGATIVE mg/dL
HGB URINE DIPSTICK: NEGATIVE
Ketones, ur: NEGATIVE mg/dL
Nitrite: NEGATIVE
PH: 5 (ref 5.0–8.0)
Protein, ur: NEGATIVE mg/dL
SPECIFIC GRAVITY, URINE: 1.023 (ref 1.005–1.030)

## 2015-07-29 LAB — TYPE AND SCREEN
ABO/RH(D): O POS
ANTIBODY SCREEN: NEGATIVE

## 2015-07-29 LAB — CBC
HEMATOCRIT: 42.7 % (ref 40.0–52.0)
Hemoglobin: 14.5 g/dL (ref 13.0–18.0)
MCH: 31.9 pg (ref 26.0–34.0)
MCHC: 33.9 g/dL (ref 32.0–36.0)
MCV: 94 fL (ref 80.0–100.0)
PLATELETS: 190 10*3/uL (ref 150–440)
RBC: 4.54 MIL/uL (ref 4.40–5.90)
RDW: 12.8 % (ref 11.5–14.5)
WBC: 9.4 10*3/uL (ref 3.8–10.6)

## 2015-07-29 LAB — ABO/RH: ABO/RH(D): O POS

## 2015-07-29 LAB — PROTIME-INR
INR: 1.13
Prothrombin Time: 14.7 seconds (ref 11.4–15.0)

## 2015-07-29 LAB — SURGICAL PCR SCREEN
MRSA, PCR: NEGATIVE
STAPHYLOCOCCUS AUREUS: NEGATIVE

## 2015-07-29 LAB — APTT: aPTT: 29 s (ref 24–36)

## 2015-07-29 NOTE — Patient Instructions (Signed)
  Your procedure is scheduled on: Thursday Feb. 23, 2017. Report to Same Day Surgery. To find out your arrival time please call [phone] between 1PM - 3PM on Wednesday Feb. 22, 2017.Marland Kitchen  Remember: Instructions that are not followed completely may result in serious medical risk, up to and including death, or upon the discretion of your surgeon and anesthesiologist your surgery may need to be rescheduled.    _x___ 1. Do not eat food or drink liquids after midnight. No gum chewing or hard candies.     ____ 2. No Alcohol for 24 hours before or after surgery.   ____ 3. Bring all medications with you on the day of surgery if instructed.    __x__ 4. Notify your doctor if there is any change in your medical condition     (cold, fever, infections).     Do not wear jewelry, make-up, hairpins, clips or nail polish.  Do not wear lotions, powders, or perfumes. You may wear deodorant.  Do not shave 48 hours prior to surgery. Men may shave face and neck.  Do not bring valuables to the hospital.    [facility] is not responsible for any belongings or valuables.               Contacts, dentures or bridgework may not be worn into surgery.  Leave your suitcase in the car. After surgery it may be brought to your room.  For patients admitted to the hospital, discharge time is determined by your treatment team.   Patients discharged the day of surgery will not be allowed to drive home.    Please read over the following fact sheets that you were given:   [facility] Preparing for Surgery  __x__ Take these medicines the morning of surgery with A SIP OF WATER:    1. atorvastatin (LIPITOR)   2. BYSTOLIC  3. traMADol (ULTRAM) if needed.  ____ Fleet Enema (as directed)   _x___ Use CHG Soap as directed  ____ Use inhalers on the day of surgery  ____ Stop metformin 2 days prior to surgery    ____ Take 1/2 of usual insulin dose the night before surgery and none on the morning of surgery.   __x__  Stop Coumadin/Plavix/aspirin on 08/06/15  __x__ Stop Anti-inflammatories Meloxicam on 08/06/15.  OK to take Tylenol or Tramadol for pain.   __x__ Stop supplements Fish oil and niacin until after surgery.    _x___ Bring C-Pap to the hospital.

## 2015-07-29 NOTE — Pre-Procedure Instructions (Signed)
Spoke with Dr. Harden Mo office regarding EKG done 03/27/15, do we need to repeat EKG today, pt & wife denies any cardiac symptoms since EKG was done.  Also asked if Anesthesia needs to come and see pt or is regular PAT visit with RN screen pt for anesthesia would be OK.  He is OK with RN screening pt during PAT visit and contacting Anesthesia if need arrives and EKG can be dc'd.

## 2015-07-29 NOTE — Pre-Procedure Instructions (Signed)
Lab called to say pt's blood clotted in the tubes and need to be redrawn, pt was at Radiology for CXR and asked to return to PAT to have blood redrawn.

## 2015-07-30 DIAGNOSIS — M1611 Unilateral primary osteoarthritis, right hip: Secondary | ICD-10-CM | POA: Diagnosis not present

## 2015-07-31 NOTE — Pre-Procedure Instructions (Signed)
No order seen about "foley in OR", asked Dr. Mack Guise if wanted one. (faxed, but was no answer)  Asked nurses to call on days!!

## 2015-08-05 ENCOUNTER — Ambulatory Visit
Admission: RE | Admit: 2015-08-05 | Discharge: 2015-08-05 | Disposition: A | Discharge status: Home / Self Care | Payer: Medicare Other | Source: Ambulatory Visit | Attending: Urology | Admitting: Urology

## 2015-08-05 DIAGNOSIS — N200 Calculus of kidney: Secondary | ICD-10-CM | POA: Diagnosis not present

## 2015-08-05 DIAGNOSIS — I700 Atherosclerosis of aorta: Secondary | ICD-10-CM | POA: Diagnosis not present

## 2015-08-05 DIAGNOSIS — N281 Cyst of kidney, acquired: Secondary | ICD-10-CM

## 2015-08-05 DIAGNOSIS — Q6100 Congenital renal cyst, unspecified: Secondary | ICD-10-CM | POA: Insufficient documentation

## 2015-08-05 DIAGNOSIS — K760 Fatty (change of) liver, not elsewhere classified: Secondary | ICD-10-CM | POA: Diagnosis not present

## 2015-08-05 MED ORDER — IOHEXOL 350 MG/ML SOLN
100.0000 mL | Freq: Once | INTRAVENOUS | Status: AC | PRN
  Administered 2015-08-05: 100 mL via INTRAVENOUS

## 2015-08-06 ENCOUNTER — Telehealth: Payer: Self-pay | Admitting: Internal Medicine

## 2015-08-06 ENCOUNTER — Telehealth: Payer: Self-pay

## 2015-08-06 NOTE — Telephone Encounter (Signed)
Spoke with pt in reference to renal lesion. Pt voiced understanding.

## 2015-08-06 NOTE — Telephone Encounter (Signed)
Please advise 

## 2015-08-06 NOTE — Telephone Encounter (Signed)
-----   Message from Nori Riis, PA-C sent at 08/06/2015  9:01 AM EST ----- Patient's renal lesion is stable.  We need to follow up in one year with a CT scan.

## 2015-08-06 NOTE — Telephone Encounter (Signed)
Yes,  He should stop 7 days prior .  His surgeon should have already discussed that with him. patient has dementia so make sure his wife is aware of instructions

## 2015-08-06 NOTE — Telephone Encounter (Signed)
The patient left a voicemail wanting to know if he should stop his Plavix prior to his hip replacement surgery.

## 2015-08-06 NOTE — Telephone Encounter (Signed)
Spoke with patient and wife. After he called [place] he read the instructions from the surgeon and it was written in the pre notes to stop 7 days prior.  The thanked [place] for our agreement.

## 2015-08-11 ENCOUNTER — Telehealth: Payer: Self-pay | Admitting: *Deleted

## 2015-08-11 NOTE — Telephone Encounter (Signed)
Voicemail; Sherry from [facility] stated that she faxed over a second medical clearance, for patient to have surgery on feb. 23,2017. Patient had a CT scan for growth on kidneys. Please Advise Judeen Hammans from [facility] [phone] ext [phone]

## 2015-08-12 ENCOUNTER — Telehealth: Payer: Self-pay | Admitting: Internal Medicine

## 2015-08-12 NOTE — Telephone Encounter (Signed)
Medical clearance for hip replacement has been done via form and returned in red folder to [facility]

## 2015-08-12 NOTE — Telephone Encounter (Signed)
Paperwork faxed.  Mrs. Villena to pick up copy.  Forms placed in front office.

## 2015-08-13 ENCOUNTER — Encounter: Payer: Self-pay | Admitting: *Deleted

## 2015-08-13 ENCOUNTER — Inpatient Hospital Stay: Payer: Medicare Other | Admitting: Anesthesiology

## 2015-08-13 ENCOUNTER — Inpatient Hospital Stay: Payer: Medicare Other

## 2015-08-13 ENCOUNTER — Encounter
Admission: AD | Disposition: A | Discharge status: Home Health | Payer: Self-pay | Source: Ambulatory Visit | Attending: Orthopedic Surgery

## 2015-08-13 ENCOUNTER — Inpatient Hospital Stay
Admission: AD | Admit: 2015-08-13 | Discharge: 2015-08-16 | DRG: 470 | Disposition: A | Discharge status: Home Health | Payer: Medicare Other | Source: Ambulatory Visit | Attending: Orthopedic Surgery | Admitting: Orthopedic Surgery

## 2015-08-13 DIAGNOSIS — E785 Hyperlipidemia, unspecified: Secondary | ICD-10-CM | POA: Diagnosis present

## 2015-08-13 DIAGNOSIS — N400 Enlarged prostate without lower urinary tract symptoms: Secondary | ICD-10-CM | POA: Diagnosis present

## 2015-08-13 DIAGNOSIS — N289 Disorder of kidney and ureter, unspecified: Secondary | ICD-10-CM | POA: Diagnosis present

## 2015-08-13 DIAGNOSIS — Z96641 Presence of right artificial hip joint: Secondary | ICD-10-CM | POA: Diagnosis not present

## 2015-08-13 DIAGNOSIS — Z791 Long term (current) use of non-steroidal anti-inflammatories (NSAID): Secondary | ICD-10-CM | POA: Diagnosis not present

## 2015-08-13 DIAGNOSIS — Z7982 Long term (current) use of aspirin: Secondary | ICD-10-CM | POA: Diagnosis not present

## 2015-08-13 DIAGNOSIS — G4730 Sleep apnea, unspecified: Secondary | ICD-10-CM | POA: Diagnosis present

## 2015-08-13 DIAGNOSIS — Z471 Aftercare following joint replacement surgery: Secondary | ICD-10-CM | POA: Diagnosis not present

## 2015-08-13 DIAGNOSIS — Z8781 Personal history of (healed) traumatic fracture: Secondary | ICD-10-CM

## 2015-08-13 DIAGNOSIS — E663 Overweight: Secondary | ICD-10-CM | POA: Diagnosis present

## 2015-08-13 DIAGNOSIS — M1611 Unilateral primary osteoarthritis, right hip: Secondary | ICD-10-CM | POA: Diagnosis present

## 2015-08-13 DIAGNOSIS — Z79899 Other long term (current) drug therapy: Secondary | ICD-10-CM | POA: Diagnosis not present

## 2015-08-13 DIAGNOSIS — I10 Essential (primary) hypertension: Secondary | ICD-10-CM | POA: Diagnosis present

## 2015-08-13 DIAGNOSIS — Z6830 Body mass index (BMI) 30.0-30.9, adult: Secondary | ICD-10-CM

## 2015-08-13 DIAGNOSIS — Z9889 Other specified postprocedural states: Secondary | ICD-10-CM

## 2015-08-13 DIAGNOSIS — Z96649 Presence of unspecified artificial hip joint: Secondary | ICD-10-CM

## 2015-08-13 DIAGNOSIS — Z419 Encounter for procedure for purposes other than remedying health state, unspecified: Secondary | ICD-10-CM

## 2015-08-13 DIAGNOSIS — I739 Peripheral vascular disease, unspecified: Secondary | ICD-10-CM | POA: Diagnosis not present

## 2015-08-13 HISTORY — PX: TOTAL HIP ARTHROPLASTY: SHX124

## 2015-08-13 LAB — TYPE AND SCREEN
ABO/RH(D): O POS
Antibody Screen: NEGATIVE

## 2015-08-13 SURGERY — ARTHROPLASTY, HIP, TOTAL,POSTERIOR APPROACH
Anesthesia: Spinal | Site: Hip | Laterality: Right | Wound class: Clean

## 2015-08-13 MED ORDER — BISACODYL 10 MG RE SUPP
10.0000 mg | Freq: Every day | RECTAL | Status: DC | PRN
  Administered 2015-08-15: 10 mg via RECTAL
  Filled 2015-08-13: qty 1

## 2015-08-13 MED ORDER — OXYCODONE HCL 5 MG/5ML PO SOLN: 5.0000 mg | Freq: Once | ORAL | Status: DC | PRN

## 2015-08-13 MED ORDER — VITAMIN D 1000 UNITS PO TABS
1000.0000 [IU] | ORAL_TABLET | Freq: Every day | ORAL | Status: DC
  Administered 2015-08-14 – 2015-08-16 (×3): 1000 [IU] via ORAL
  Filled 2015-08-13 (×6): qty 1

## 2015-08-13 MED ORDER — MENTHOL 3 MG MT LOZG
1.0000 | LOZENGE | OROMUCOSAL | Status: DC | PRN
  Filled 2015-08-13: qty 9

## 2015-08-13 MED ORDER — DOCUSATE SODIUM 100 MG PO CAPS
100.0000 mg | ORAL_CAPSULE | Freq: Two times a day (BID) | ORAL | Status: DC
  Administered 2015-08-13 – 2015-08-15 (×4): 100 mg via ORAL
  Filled 2015-08-13 (×4): qty 1

## 2015-08-13 MED ORDER — ONDANSETRON HCL 4 MG/2ML IJ SOLN
INTRAMUSCULAR | Status: DC | PRN
  Administered 2015-08-13: 4 mg via INTRAVENOUS

## 2015-08-13 MED ORDER — HYDROCHLOROTHIAZIDE 25 MG PO TABS
25.0000 mg | ORAL_TABLET | Freq: Every day | ORAL | Status: DC
  Administered 2015-08-14 – 2015-08-16 (×3): 25 mg via ORAL
  Filled 2015-08-13 (×3): qty 1

## 2015-08-13 MED ORDER — MEMANTINE HCL 10 MG PO TABS
10.0000 mg | ORAL_TABLET | Freq: Every day | ORAL | Status: DC
  Administered 2015-08-13 – 2015-08-16 (×4): 10 mg via ORAL
  Filled 2015-08-13 (×4): qty 1

## 2015-08-13 MED ORDER — OXYCODONE HCL 5 MG PO TABS: 5.0000 mg | ORAL_TABLET | Freq: Once | ORAL | Status: DC | PRN

## 2015-08-13 MED ORDER — PROPOFOL 10 MG/ML IV BOLUS
INTRAVENOUS | Status: DC | PRN
  Administered 2015-08-13: 100 mg via INTRAVENOUS

## 2015-08-13 MED ORDER — CLINDAMYCIN PHOSPHATE 600 MG/50ML IV SOLN
600.0000 mg | Freq: Once | INTRAVENOUS | Status: AC
  Administered 2015-08-13: 600 mg via INTRAVENOUS

## 2015-08-13 MED ORDER — LACTATED RINGERS IV SOLN
INTRAVENOUS | Status: DC
  Administered 2015-08-13 (×3): via INTRAVENOUS

## 2015-08-13 MED ORDER — NEBIVOLOL HCL 2.5 MG PO TABS
2.5000 mg | ORAL_TABLET | Freq: Once | ORAL | Status: AC
  Administered 2015-08-13: 2.5 mg via ORAL
  Filled 2015-08-13: qty 1

## 2015-08-13 MED ORDER — CHLORHEXIDINE GLUCONATE 4 % EX LIQD: 1.0000 "application " | Freq: Once | CUTANEOUS | Status: DC

## 2015-08-13 MED ORDER — NEBIVOLOL HCL 5 MG PO TABS
2.5000 mg | ORAL_TABLET | Freq: Every day | ORAL | Status: DC
  Administered 2015-08-15 – 2015-08-16 (×2): 2.5 mg via ORAL
  Filled 2015-08-13 (×4): qty 1

## 2015-08-13 MED ORDER — FAMOTIDINE 20 MG PO TABS
20.0000 mg | ORAL_TABLET | Freq: Once | ORAL | Status: AC
Start: 2015-08-13 — End: 2015-08-13
  Administered 2015-08-13: 20 mg via ORAL

## 2015-08-13 MED ORDER — METHOCARBAMOL 500 MG PO TABS: 500.0000 mg | ORAL_TABLET | Freq: Four times a day (QID) | ORAL | Status: DC | PRN

## 2015-08-13 MED ORDER — CEFAZOLIN SODIUM-DEXTROSE 2-3 GM-% IV SOLR
2.0000 g | Freq: Four times a day (QID) | INTRAVENOUS | Status: AC
  Administered 2015-08-13 (×2): 2 g via INTRAVENOUS
  Filled 2015-08-13 (×3): qty 50

## 2015-08-13 MED ORDER — MORPHINE SULFATE (PF) 2 MG/ML IV SOLN: 2.0000 mg | INTRAVENOUS | Status: DC | PRN

## 2015-08-13 MED ORDER — KETAMINE HCL 10 MG/ML IJ SOLN
INTRAMUSCULAR | Status: DC | PRN
  Administered 2015-08-13 (×2): 25 mg via INTRAVENOUS

## 2015-08-13 MED ORDER — NIACIN 500 MG PO TABS
250.0000 mg | ORAL_TABLET | Freq: Every day | ORAL | Status: DC
  Administered 2015-08-15 – 2015-08-16 (×2): 250 mg via ORAL
  Filled 2015-08-13 (×4): qty 1

## 2015-08-13 MED ORDER — EPHEDRINE SULFATE 50 MG/ML IJ SOLN
INTRAMUSCULAR | Status: DC | PRN
  Administered 2015-08-13 (×2): 5 mg via INTRAVENOUS

## 2015-08-13 MED ORDER — MAGNESIUM CITRATE PO SOLN
1.0000 | Freq: Once | ORAL | Status: DC | PRN
  Filled 2015-08-13: qty 296

## 2015-08-13 MED ORDER — ACETAMINOPHEN 650 MG RE SUPP: 650.0000 mg | Freq: Four times a day (QID) | RECTAL | Status: DC | PRN

## 2015-08-13 MED ORDER — ONDANSETRON HCL 4 MG/2ML IJ SOLN: 4.0000 mg | Freq: Four times a day (QID) | INTRAMUSCULAR | Status: DC | PRN

## 2015-08-13 MED ORDER — NEOMYCIN-POLYMYXIN B GU 40-200000 IR SOLN
Status: DC | PRN
  Administered 2015-08-13: 16 mL

## 2015-08-13 MED ORDER — ALUM & MAG HYDROXIDE-SIMETH 200-200-20 MG/5ML PO SUSP: 30.0000 mL | ORAL | Status: DC | PRN

## 2015-08-13 MED ORDER — FINASTERIDE 5 MG PO TABS
5.0000 mg | ORAL_TABLET | Freq: Every day | ORAL | Status: DC
  Administered 2015-08-14 – 2015-08-16 (×3): 5 mg via ORAL
  Filled 2015-08-13 (×3): qty 1

## 2015-08-13 MED ORDER — PHENYLEPHRINE HCL 10 MG/ML IJ SOLN
INTRAMUSCULAR | Status: DC | PRN
  Administered 2015-08-13 (×8): 100 ug via INTRAVENOUS

## 2015-08-13 MED ORDER — ATORVASTATIN CALCIUM 20 MG PO TABS
40.0000 mg | ORAL_TABLET | Freq: Every day | ORAL | Status: DC
  Administered 2015-08-14 – 2015-08-16 (×3): 40 mg via ORAL
  Filled 2015-08-13 (×3): qty 2

## 2015-08-13 MED ORDER — ACETAMINOPHEN 325 MG PO TABS
650.0000 mg | ORAL_TABLET | Freq: Four times a day (QID) | ORAL | Status: DC | PRN
  Administered 2015-08-13: 650 mg via ORAL
  Filled 2015-08-13: qty 2

## 2015-08-13 MED ORDER — FENTANYL CITRATE (PF) 100 MCG/2ML IJ SOLN: 25.0000 ug | INTRAMUSCULAR | Status: DC | PRN

## 2015-08-13 MED ORDER — GLYCOPYRROLATE 0.2 MG/ML IJ SOLN
INTRAMUSCULAR | Status: DC | PRN
  Administered 2015-08-13 (×2): 0.2 mg via INTRAVENOUS

## 2015-08-13 MED ORDER — CEFAZOLIN SODIUM-DEXTROSE 2-3 GM-% IV SOLR
2.0000 g | INTRAVENOUS | Status: AC
  Administered 2015-08-13: 2 g via INTRAVENOUS

## 2015-08-13 MED ORDER — ADULT MULTIVITAMIN W/MINERALS CH
1.0000 | ORAL_TABLET | Freq: Every day | ORAL | Status: DC
  Administered 2015-08-14 – 2015-08-16 (×3): 1 via ORAL
  Filled 2015-08-13 (×3): qty 1

## 2015-08-13 MED ORDER — POLYETHYLENE GLYCOL 3350 17 G PO PACK
17.0000 g | PACK | Freq: Every day | ORAL | Status: DC | PRN
  Administered 2015-08-14: 17 g via ORAL
  Filled 2015-08-13: qty 1

## 2015-08-13 MED ORDER — PHENOL 1.4 % MT LIQD
1.0000 | OROMUCOSAL | Status: DC | PRN
Start: 2015-08-13 — End: 2015-08-16
  Filled 2015-08-13: qty 177

## 2015-08-13 MED ORDER — OMEGA-3-ACID ETHYL ESTERS 1 G PO CAPS
1.0000 g | ORAL_CAPSULE | Freq: Every day | ORAL | Status: DC
  Administered 2015-08-14 – 2015-08-16 (×3): 1 g via ORAL
  Filled 2015-08-13 (×3): qty 1

## 2015-08-13 MED ORDER — METHOCARBAMOL 1000 MG/10ML IJ SOLN
500.0000 mg | Freq: Four times a day (QID) | INTRAVENOUS | Status: DC | PRN
  Filled 2015-08-13: qty 5

## 2015-08-13 MED ORDER — SODIUM CHLORIDE 0.9 % IV SOLN
75.0000 mL/h | INTRAVENOUS | Status: DC
  Administered 2015-08-13: 75 mL/h via INTRAVENOUS

## 2015-08-13 MED ORDER — NEOMYCIN-POLYMYXIN B GU 40-200000 IR SOLN
Status: AC
  Filled 2015-08-13: qty 20

## 2015-08-13 MED ORDER — SENNA 8.6 MG PO TABS
1.0000 | ORAL_TABLET | Freq: Two times a day (BID) | ORAL | Status: DC
  Administered 2015-08-13 – 2015-08-15 (×4): 8.6 mg via ORAL
  Filled 2015-08-13 (×4): qty 1

## 2015-08-13 MED ORDER — CALCIUM CARBONATE-VITAMIN D 500-200 MG-UNIT PO TABS
1.0000 | ORAL_TABLET | Freq: Every day | ORAL | Status: DC
  Administered 2015-08-14 – 2015-08-16 (×3): 1 via ORAL
  Filled 2015-08-13 (×3): qty 1

## 2015-08-13 MED ORDER — BUPIVACAINE HCL (PF) 0.5 % IJ SOLN
INTRAMUSCULAR | Status: DC | PRN
  Administered 2015-08-13: 3 mL

## 2015-08-13 MED ORDER — PROPOFOL 500 MG/50ML IV EMUL
INTRAVENOUS | Status: DC | PRN
  Administered 2015-08-13: 50 ug/kg/min via INTRAVENOUS

## 2015-08-13 MED ORDER — CLOPIDOGREL BISULFATE 75 MG PO TABS
75.0000 mg | ORAL_TABLET | Freq: Every day | ORAL | Status: DC
  Administered 2015-08-14 – 2015-08-16 (×3): 75 mg via ORAL
  Filled 2015-08-13 (×3): qty 1

## 2015-08-13 MED ORDER — OXYCODONE HCL 5 MG PO TABS
5.0000 mg | ORAL_TABLET | ORAL | Status: DC | PRN
  Administered 2015-08-13: 10 mg via ORAL
  Administered 2015-08-13 – 2015-08-14 (×2): 5 mg via ORAL
  Filled 2015-08-13 (×3): qty 1
  Filled 2015-08-13: qty 2
  Filled 2015-08-13: qty 1

## 2015-08-13 MED ORDER — ASPIRIN EC 325 MG PO TBEC
325.0000 mg | DELAYED_RELEASE_TABLET | Freq: Two times a day (BID) | ORAL | Status: DC
  Administered 2015-08-13 – 2015-08-16 (×6): 325 mg via ORAL
  Filled 2015-08-13 (×6): qty 1

## 2015-08-13 MED ORDER — MIDAZOLAM HCL 5 MG/5ML IJ SOLN
INTRAMUSCULAR | Status: DC | PRN
  Administered 2015-08-13 (×2): 1 mg via INTRAVENOUS

## 2015-08-13 MED ORDER — ONDANSETRON HCL 4 MG PO TABS: 4.0000 mg | ORAL_TABLET | Freq: Four times a day (QID) | ORAL | Status: DC | PRN

## 2015-08-13 MED ORDER — FENTANYL CITRATE (PF) 100 MCG/2ML IJ SOLN
INTRAMUSCULAR | Status: DC | PRN
  Administered 2015-08-13 (×2): 50 ug via INTRAVENOUS

## 2015-08-13 SURGICAL SUPPLY — 58 items
ADAPTER IRRIG TUBE 2 SPIKE SOL (ADAPTER) ×3 IMPLANT
BLADE DEBAKEY 8.0 (BLADE) ×2 IMPLANT
BLADE DEBAKEY 8.0MM (BLADE) ×1
BLADE SAGITTAL WIDE XTHICK NO (BLADE) ×3 IMPLANT
BLADE SURG SZ10 CARB STEEL (BLADE) ×3 IMPLANT
CANISTER SUCT 1200ML W/VALVE (MISCELLANEOUS) ×3 IMPLANT
CANISTER SUCT 3000ML (MISCELLANEOUS) ×3 IMPLANT
CAPT HIP TOTAL 2 ×3 IMPLANT
CATH TRAY METER 16FR LF (MISCELLANEOUS) ×3 IMPLANT
CLOSURE WOUND 1/2 X4 (GAUZE/BANDAGES/DRESSINGS)
DRAPE IMP U-DRAPE 54X76 (DRAPES) ×3 IMPLANT
DRAPE INCISE IOBAN 66X60 STRL (DRAPES) ×3 IMPLANT
DRAPE SHEET LG 3/4 BI-LAMINATE (DRAPES) ×6 IMPLANT
DRAPE SURG 17X11 SM STRL (DRAPES) ×3 IMPLANT
DRAPE TABLE BACK 80X90 (DRAPES) ×3 IMPLANT
DRSG OPSITE POSTOP 4X12 (GAUZE/BANDAGES/DRESSINGS) ×3 IMPLANT
DURAPREP 26ML APPLICATOR (WOUND CARE) ×9 IMPLANT
ELECT BLADE 6.5 EXT (BLADE) ×3 IMPLANT
ELECT CAUTERY BLADE 6.4 (BLADE) ×3 IMPLANT
ELECT REM PT RETURN 9FT ADLT (ELECTROSURGICAL) ×3
ELECTRODE REM PT RTRN 9FT ADLT (ELECTROSURGICAL) ×1 IMPLANT
GAUZE PETRO XEROFOAM 1X8 (MISCELLANEOUS) IMPLANT
GAUZE SPONGE 4X4 12PLY STRL (GAUZE/BANDAGES/DRESSINGS) ×3 IMPLANT
GLOVE BIOGEL PI IND STRL 9 (GLOVE) ×1 IMPLANT
GLOVE BIOGEL PI INDICATOR 9 (GLOVE) ×2
GLOVE SURG 9.0 ORTHO LTXF (GLOVE) ×6 IMPLANT
GOWN STRL REUS TWL 2XL XL LVL4 (GOWN DISPOSABLE) ×3 IMPLANT
GOWN STRL REUS W/ TWL LRG LVL3 (GOWN DISPOSABLE) ×1 IMPLANT
GOWN STRL REUS W/TWL LRG LVL3 (GOWN DISPOSABLE) ×2
NEEDLE FILTER BLUNT 18X 1/2SAF (NEEDLE) ×2
NEEDLE FILTER BLUNT 18X1 1/2 (NEEDLE) ×1 IMPLANT
NEEDLE MAYO CATGUT SZ4 (NEEDLE) IMPLANT
NS IRRIG 1000ML POUR BTL (IV SOLUTION) ×3 IMPLANT
PACK HIP PROSTHESIS (MISCELLANEOUS) ×3 IMPLANT
PILLOW ABDUC SM (MISCELLANEOUS) ×3 IMPLANT
RETRIEVER SUT HEWSON (MISCELLANEOUS) ×3 IMPLANT
SOL .9 NS 3000ML IRR  AL (IV SOLUTION) ×2
SOL .9 NS 3000ML IRR UROMATIC (IV SOLUTION) ×1 IMPLANT
SPONGE LAP 18X18 5 PK (GAUZE/BANDAGES/DRESSINGS) IMPLANT
STAPLER SKIN PROX 35W (STAPLE) ×3 IMPLANT
STRAP SAFETY BODY (MISCELLANEOUS) ×3 IMPLANT
STRIP CLOSURE SKIN 1/2X4 (GAUZE/BANDAGES/DRESSINGS) IMPLANT
SUT BONE WAX W31G (SUTURE) ×3 IMPLANT
SUT ETHIBOND NAB CT1 #1 30IN (SUTURE) ×9 IMPLANT
SUT MNCRL 3 0 RB1 (SUTURE) IMPLANT
SUT MNCRL AB 3-0 PS2 27 (SUTURE) ×9 IMPLANT
SUT MONOCRYL 3 0 RB1 (SUTURE)
SUT TICRON 2-0 30IN 311381 (SUTURE) ×12 IMPLANT
SUT VIC AB 0 CT1 36 (SUTURE) ×9 IMPLANT
SUT VIC AB 2-0 CT1 27 (SUTURE) ×4
SUT VIC AB 2-0 CT1 TAPERPNT 27 (SUTURE) ×2 IMPLANT
SUT VIC AB 2-0 CT2 27 (SUTURE) ×9 IMPLANT
SYR 20CC LL (SYRINGE) ×3 IMPLANT
SYS IRRIGATION SURGILAV PLUS (MISCELLANEOUS) ×3 IMPLANT
TAPE MICROFOAM 4IN (TAPE) ×3 IMPLANT
TAPE TRANSPORE STRL 2 31045 (GAUZE/BANDAGES/DRESSINGS) ×3 IMPLANT
TUBE SUCT KAM VAC (TUBING) ×3 IMPLANT
WATER STERILE IRR 1000ML POUR (IV SOLUTION) ×3 IMPLANT

## 2015-08-13 NOTE — Progress Notes (Signed)
  Subjective:  POST- OP CHECK:  Patient was seen in his hospital room with his nurse, Maudie Mercury. Patient reports right hip pain as mild.  Patient lying in bed. His wife and daughter at bedtime. Patient is slightly confused post-op as he following general anesthesia. Patient also received a spinal which is beginning to wear off.  Patient has no active complaints.  Objective:   VITALS:   Filed Vitals:   08/13/15 1137 08/13/15 1152 08/13/15 1200 08/13/15 1225  BP: 141/67 163/72  160/65  Pulse: 53 50 48 49  Temp:  96.9 F (36.1 C)    TempSrc:      Resp: 16 14 12 16   Height:      Weight:      SpO2: 100% 100% 100% 100%    PHYSICAL EXAM:  Right hip: Patient's dressing was reinforced. It is currently clean dry and intact. He can partially dorsiflex his ankle. He has slightly diminished sensation to light touch in both feet. He has palpable pedal pulses.   LABS  Results for orders placed or performed during the hospital encounter of 08/13/15 (from the past 24 hour(s))  Type and screen [place]     Status: None   Collection Time: 08/13/15  6:15 AM  Result Value Ref Range   ABO/RH(D) O POS    Antibody Screen NEG    Sample Expiration 08/16/2015     Dg Pelvis Portable  08/13/2015  CLINICAL DATA:  Portable postoperative view of the mid and lower pelvis and the hips. EXAM: PORTABLE PELVIS 1-2 VIEWS COMPARISON:  Right hip AP portable view of June 16, 2015 FINDINGS: The patient has undergone right hip prosthesis placement. Radiographic positioning of the prosthetic components is good. The interface with the native bone appears normal. IMPRESSION: The patient is status post right total hip joint replacement. There is no evidence of immediate postprocedure complication. Electronically Signed   By: David  Martinique M.D.   On: 08/13/2015 11:17   Dg Hip Port Unilat With Pelvis 1v Right  08/13/2015  CLINICAL DATA:  Postop EXAM: DG HIP (WITH OR WITHOUT PELVIS) 1V PORT RIGHT  COMPARISON:  04/20/2015 FINDINGS: Right total hip arthroplasty is in place. Anatomic alignment of the osseous and prostatic structures. No breakage or loosening of the hardware. IMPRESSION: Right total hip arthroplasty anatomically aligned. Electronically Signed   By: Marybelle Killings M.D.   On: 08/13/2015 11:56    Assessment/Plan: Day of Surgery   Active Problems:   H/O total hip arthroplasty  Patient is stable postop. He received 24 hours postop antibiotics. He will be encouraged to use incentive spirometry while awake. He is an abduction pillow between his legs. He has TED stockings and SCDs. He will restart his Plavix and will have aspirin 325 mg twice a day for DVT prophylaxis.  A CBC and BMP are ordered for the morning. I reviewed the postoperative x-rays and right total hip arthroplasty components are well-positioned. The leg lengths appeared equivalent. There is no fracture, dislocation or other osseous abnormality.    [name] , MD 08/13/2015, 12:49 PM

## 2015-08-13 NOTE — Anesthesia Procedure Notes (Addendum)
Spinal Patient location during procedure: OR Start time: 08/13/2015 7:55 AM End time: 08/13/2015 7:58 AM Staffing Anesthesiologist: Katy Fitch K Performed by: anesthesiologist  Preanesthetic Checklist Completed: patient identified, site marked, surgical consent, pre-op evaluation, timeout performed, IV checked, risks and benefits discussed and monitors and equipment checked Spinal Block Patient position: sitting Prep: Betadine Patient monitoring: heart rate, continuous pulse ox, blood pressure and cardiac monitor Approach: midline Location: L4-5 Injection technique: single-shot Needle Needle type: Whitacre and Introducer  Needle gauge: 24 G Needle length: 9 cm Assessment Sensory level: T10 Additional Notes Negative paresthesia. Negative blood return. Positive free-flowing CSF. Expiration date of kit checked and confirmed. Patient tolerated procedure well, without complications.    Procedure Name: LMA Insertion Date/Time: 08/13/2015 8:45 AM Performed by: Nelda Marseille Pre-anesthesia Checklist: Patient identified, Patient being monitored, Timeout performed, Emergency Drugs available and Suction available Patient Re-evaluated:Patient Re-evaluated prior to inductionOxygen Delivery Method: Circle system utilized Preoxygenation: Pre-oxygenation with 100% oxygen Intubation Type: IV induction Ventilation: Mask ventilation without difficulty LMA: LMA inserted LMA Size: 4.5 Tube type: Oral Number of attempts: 1 Placement Confirmation: positive ETCO2 and breath sounds checked- equal and bilateral Tube secured with: Tape Dental Injury: Teeth and Oropharynx as per pre-operative assessment  Comments: Spinal failed LMA insertion and conversion to general anesthetic

## 2015-08-13 NOTE — Care Management Note (Signed)
Case Management Note  Patient Details  Name: [name] MRN: [id number] Date of Birth: [birth year]  Subjective/Objective:     82yo Mr [name] received a right THA by Dr Mack Guise on 08/13/15. Altamease Oiler, Medicare Bundle case manager, approved [facility] to provide home health PT and nurse aid services  This writer updated Corliss Blacker at [place] of this [facility] referral. PCP=Dr Derrel Nip. [facility] [address]. Lovenox 40mg  [facility] [address]. Resides with his wife who will provide transportation to appointments. Has a rolling walker and a BSC. No home oxygen and no current home health services. Case management will follow for discharge planning.             Action/Plan:   Expected Discharge Date:  08/16/15               Expected Discharge Plan:     In-House Referral:     Discharge planning Services     Post Acute Care Choice:    Choice offered to:     DME Arranged:    DME Agency:     HH Arranged:    [place] Agency:     Status of Service:     Medicare Important Message Given:    Date Medicare IM Given:    Medicare IM give by:    Date Additional Medicare IM Given:    Additional Medicare Important Message give by:     If discussed at [place] of Stay Meetings, dates discussed:    Additional Comments:  Jazia Faraci A, RN 08/13/2015, 3:05 PM

## 2015-08-13 NOTE — Progress Notes (Signed)
Patient is in the Medicare Bundle Program. Medicare Bundle Case Manager notified. Clinical Social Worker (CSW) will continue to follow and assist as needed.   Blima Rich, LCSW [phone]

## 2015-08-13 NOTE — Transfer of Care (Signed)
Immediate Anesthesia Transfer of Care Note  Patient: Christopher Jenkins  Procedure(s) Performed: Procedure(s): TOTAL HIP ARTHROPLASTY (Right)  Patient Location: PACU  Anesthesia Type:General  Level of Consciousness: sedated  Airway & Oxygen Therapy: Patient Spontanous Breathing and Patient connected to face mask oxygen  Post-op Assessment: Report given to RN and Post -op Vital signs reviewed and stable  Post vital signs: Reviewed and stable  Last Vitals:  Filed Vitals:   08/13/15 0619  BP: 160/74  Pulse: 74  Temp: 37.2 C  Resp: 14    Complications: No apparent anesthesia complications

## 2015-08-13 NOTE — Anesthesia Postprocedure Evaluation (Signed)
Anesthesia Post Note  Patient: Christopher Jenkins  Procedure(s) Performed: Procedure(s) (LRB): TOTAL HIP ARTHROPLASTY (Right)  Patient location during evaluation: PACU Anesthesia Type: General Level of consciousness: awake and alert Pain management: pain level controlled Vital Signs Assessment: post-procedure vital signs reviewed and stable Respiratory status: spontaneous breathing, nonlabored ventilation, respiratory function stable and patient connected to nasal cannula oxygen Cardiovascular status: blood pressure returned to baseline and stable Postop Assessment: no signs of nausea or vomiting Anesthetic complications: no    Last Vitals:  Filed Vitals:   08/13/15 1225 08/13/15 1302  BP: 160/65 173/65  Pulse: 49 51  Temp:  36.2 C  Resp: 16 16    Last Pain:  Filed Vitals:   08/13/15 1302  PainSc: 5                  Precious Haws Heith Haigler

## 2015-08-13 NOTE — Op Note (Addendum)
08/13/2015  11:43 AM  PATIENT:  Christopher Jenkins   MRN: [id number]  PRE-OPERATIVE DIAGNOSIS:  unilateral primary right hip osteoarthritis  POST-OPERATIVE DIAGNOSIS:  primary osteoarthritis right hip  PROCEDURE:  Procedure(s): TOTAL HIP ARTHROPLASTY  PREOPERATIVE INDICATIONS:    Christopher Jenkins is an 82 y.o. male who has a diagnosis of right hip osteoarthritis and elected for surgical management after failing conservative treatment.  Radiographs have demonstrated bone-on-bone contact between the femoral head and acetabulum, subchondral sclerosis and cyst formation with marginal osteophytes and flattening of the femoral head. The risks benefits and alternatives were discussed with the patient including but not limited to the risks of total hip arthroplasty including infection requiring removal of the prosthesis, bleeding requiring blood transfusion, nerve injury especially to the sciatic nerve leading to foot drop or lower extremity numbness, periprosthetic fracture, dislocation leg length discrepancy, change in lower extremity rotation persistent hip pain, loosening or failure of the components and the need for revision surgery. Medical risks include but are not limited to DVT and pulmonary wasn't, myocardial infarction, stroke, pneumonia, respiratory failure and death.  OPERATIVE REPORT     SURGEON:  [name], MD    ASSISTANT:  Earnestine Leys, MD     ANESTHESIA:  Spinal and general    COMPLICATIONS:  None.   SPECIMEN: Femoral head to pathology    COMPONENTS:  Stryker Accolade II 132 degree neck angle femoral component size 4 with a 36 mm -5 head ball and a PSL HA cluster acetabular shell size 56 with two Torx 6.5 x 25 mm cancellous bone screws with a Stryker 0 degree X3 polyethylene liner.      PROCEDURE:   The patient was met in the holding area and  identified.  The appropriate hip was identified and marked at the operative site after verbally confirming with the patient that  this was the correct site of surgery.  The patient was then transported to the OR  and underwent spinal anesthesia.  The patient was then placed in the lateral decubitus position with the operative side up and secured on the operating room table with a pegboard and all bony prominences were adequately padded. This included an axillary roll and additional padding around the nonoperative leg to prevent compression to the common peroneal nerve.    The operative lower extremity was prepped and draped in a sterile fashion.  A time out was performed prior to incision to verify patient's name, date of birth, medical record number, correct site of surgery correct procedure to be performed. Was also used to verify the patient received antibiotics now appropriate instruments, implants and radiographic studies were available in the room. Once all in attendance were in agreement case began.    A posterolateral approach was utilized via sharp dissection  carried down to the subcutaneous tissue.  Patient reacted to the incision and was therefore intubated by the anesthesia service. The case then continued. Bleeding vessels were coagulated using electrocautery during subcutaneous dissection.  The deep fascia was identified and a key elevator was used to remove adherent subcutaneous tissue. The deep fascia was then sharply incised.  The gluteus maximus muscle was then split in line with its fibers. Self-retaining retractors were  Inserted to retract the deep fascia taking care to avoid injury to the sciatic nerve.  With the hip internally rotated, the short external rotators were identified, released from the greater trochanter and reflected posteriorly to protect the sciatic nerve.  The capsule was identified and a T-shaped  capsulotomy was performed. The leaflets of the capsule were tagged with #2 Tycron for later repair.  The femoral neck was exposed, and the femoral neck was osteotomized approximately a finger's breadth  above the lesser trochanter.    The attention was then turned to the acetabulum. The acetabulum was exposed using 2 Homan retractors.  After adequate visualization,the labrum and ligamentum teres were dissected from the acetabulum.  The acetabulum was then sequentially reamed until an excellent rim fit was achieved..  The trial acetabular component was then placed and had an excellent fit. The trial was then removed and the actual acetabular component was impacted into place.  Appropriate version and inclination was confirmed clinically matching the patient's bony anatomy and using the external alignment guide. Two Torx cancellus screws were placed through the cluster holes of the acetabular component  A trial polyethylene liner was placed and the retractors removed.    A femoral skid and Cobra retractor were placed under the femoral neck to allow for adequate visualization of the femoral neck. A box osteotome was used to make the initial entry into the proximal femur. A single hand reamer was used to prepare the femoral canal. A T-shaped femoral canal sounder was then used to ensure no penetration femoral cortex had occurred during reaming. The proximal femur was then sequentially broached by hand. Once adequate mediolateral canal fill was achieved the trial broach, neck, and head was assembled and the hip was reduced. It was found to have excellent stability, equivalent leg lengths with functional range of motion.  A portable, intraoperative AP pelvis film was taken to evaluate component size and positioning and to confirm equivalent leg lengths.  After the x-ray, the trial components were then removed.  Attention was turned back to the acetabulum. The trial liner was removed. The acetabulum was copiously irrigated with pulse lavage. A hole eliminator was then placed by hand using a screwdriver. The real polyethylene liner was impacted into place.  The femoral canal was copiously irrigated and  the real  femoral prosthesis impacted into place with anatomic version.  A 0 and -5 trial heads were trialed. Patient was found to have stability with both head trials with the leg lengths were more anatomic with a -5 head. The actual -536 mm femoral head component was then impacted onto the femoral trunnion. The hip was then reduced and taken through functional range of motion and found to have excellent stability. Leg lengths were equivalent. The hip joint was then copiously irrigated.  A soft tissue repair of the capsule and piriformis was performed using #2 Tycron.  An anatomic posterior capsular repair was performed. The deep fascia was then closed with interrupted 0 Vicryl suture. The subcutaneous tissues were closed with a layered closure using 0 and 2-0 Vicryl. The skin approximated with staples.  A dry sterile honeycomb dressing was placed over the incision.  The patient was then placed back in a supine on the operative table. Leg lengths were checked clinically and found to be equivalent. An abduction pillow was placed between the lower extremities. The patient was extubated and transferred to a hospital bed and brought to the PACU in stable condition. I was scrubbed and present the entire case and all sharp and instrument counts were correct at the conclusion of the case. I spoke with the patient's family in the postop consultation room to let them know the case was performed without complication and the patient was stable in recovery room.   Timoteo Gaul,  MD Orthopedic Surgeon   08/13/2015 11:43 AM

## 2015-08-13 NOTE — Progress Notes (Signed)
  Subjective:  POST-OP CHECK:  Patient reports pain as mild.  Patient is seen lying in bed. He feels tired but has no complaints of pain.  Objective:   VITALS:   Filed Vitals:   08/13/15 1454 08/13/15 1558 08/13/15 1700 08/13/15 1710  BP: 136/74 135/66 154/63   Pulse: 64 70 75   Temp: 97.7 F (36.5 C) 100.1 F (37.8 C) 102.9 F (39.4 C) 99.5 F (37.5 C)  TempSrc: Oral Oral Oral Tympanic  Resp: 18 18 18    Height:      Weight:      SpO2: 98% 94% 95%     PHYSICAL EXAM:  Right lower extremity: Patient's dressing has been reinforced but there is no additional drainage on it since earlier today. Patient's thigh and leg compartments are soft and compressible. He has palpable pedal pulses, intact sensation light touch and intact motor function. The patient can flex and extend his toes and dorsiflex and plantarflex his ankle with 5/5 strength.   LABS  Results for orders placed or performed during the hospital encounter of 08/13/15 (from the past 24 hour(s))  Type and screen [place]     Status: None   Collection Time: 08/13/15  6:15 AM  Result Value Ref Range   ABO/RH(D) O POS    Antibody Screen NEG    Sample Expiration 08/16/2015     Dg Pelvis Portable  08/13/2015  CLINICAL DATA:  Portable postoperative view of the mid and lower pelvis and the hips. EXAM: PORTABLE PELVIS 1-2 VIEWS COMPARISON:  Right hip AP portable view of June 16, 2015 FINDINGS: The patient has undergone right hip prosthesis placement. Radiographic positioning of the prosthetic components is good. The interface with the native bone appears normal. IMPRESSION: The patient is status post right total hip joint replacement. There is no evidence of immediate postprocedure complication. Electronically Signed   By: David  Martinique M.D.   On: 08/13/2015 11:17   Dg Hip Port Unilat With Pelvis 1v Right  08/13/2015  CLINICAL DATA:  Postop EXAM: DG HIP (WITH OR WITHOUT PELVIS) 1V PORT RIGHT  COMPARISON:  04/20/2015 FINDINGS: Right total hip arthroplasty is in place. Anatomic alignment of the osseous and prostatic structures. No breakage or loosening of the hardware. IMPRESSION: Right total hip arthroplasty anatomically aligned. Electronically Signed   By: Marybelle Killings M.D.   On: 08/13/2015 11:56    Assessment/Plan: Day of Surgery   Active Problems:   H/O total hip arthroplasty  Christopher Jenkins is doing well postop.  his pain is currently controlled. His spinal block is worn off and he has full motor and sensory function right lower extremity. He'll complete 24 hours of postop antibiotics. He should continue to use an incentive spirometer while awake. Labs will be rechecked in the morning. He will begin physical and occupational therapy tomorrow.     [name] , MD 08/13/2015, 6:30 PM

## 2015-08-13 NOTE — Anesthesia Preprocedure Evaluation (Addendum)
Anesthesia Evaluation  Patient identified by MRN, date of birth, ID band Patient awake    Reviewed: Allergy & Precautions, H&P , NPO status , Patient's Chart, lab work & pertinent test results  History of Anesthesia Complications Negative for: history of anesthetic complications  Airway Mallampati: III  TM Distance: >3 FB Neck ROM: limited    Dental  (+) Poor Dentition, Chipped, Missing, Partial Upper   Pulmonary neg shortness of breath, sleep apnea and Continuous Positive Airway Pressure Ventilation ,    Pulmonary exam normal breath sounds clear to auscultation       Cardiovascular Exercise Tolerance: Good hypertension, (-) angina+ Peripheral Vascular Disease  (-) Past MI and (-) DOE Normal cardiovascular exam+ dysrhythmias Atrial Fibrillation  Rhythm:regular Rate:Normal     Neuro/Psych PSYCHIATRIC DISORDERS negative neurological ROS     GI/Hepatic negative GI ROS, Neg liver ROS, neg GERD  ,  Endo/Other  negative endocrine ROS  Renal/GU Renal disease  negative genitourinary   Musculoskeletal   Abdominal   Peds  Hematology negative hematology ROS (+)   Anesthesia Other Findings Past Medical History:   Hypertrophy (benign) of prostate                               Comment:asymptomatic on finasteride. will f/up with               urologist [name]   Hyperlipidemia                                               Hypertension                                                 Tubular adenoma of colon                                     Obesity                                                      Elevated PSA                                                 BPH (benign prostatic hyperplasia)                           Urinary retention                                            Gross hematuria  Duodenal mass                                                Overweight                                                    Kidney lesion                                   07/2015         Comment:pt has appt at kidney specialist 08/05/15 to               review CT scan.  Past Surgical History:   cataracts                                                     Uvula Surgery                                    1990         APPENDECTOMY                                                 BMI    Body Mass Index   30.72 kg/m 2      Reproductive/Obstetrics negative OB ROS                            Anesthesia Physical Anesthesia Plan  ASA: III  Anesthesia Plan: Spinal   Post-op Pain Management:    Induction:   Airway Management Planned:   Additional Equipment:   Intra-op Plan:   Post-operative Plan:   Informed Consent: I have reviewed the patients History and Physical, chart, labs and discussed the procedure including the risks, benefits and alternatives for the proposed anesthesia with the patient or authorized representative who has indicated his/her understanding and acceptance.   Dental Advisory Given  Plan Discussed with: Anesthesiologist, CRNA and Surgeon  Anesthesia Plan Comments:         Anesthesia Quick Evaluation

## 2015-08-13 NOTE — Progress Notes (Signed)
Rechecked temperature with tympanic thermometer and was improved.

## 2015-08-13 NOTE — Progress Notes (Signed)
Patient arrived from PACU this shift.  Very confused and disoriented.  Spinal was incomplete so patient also received General.  As shift progressed patient became more aware of his surroundings and orientation improved.

## 2015-08-13 NOTE — H&P (Signed)
The patient has been re-examined, and the chart reviewed, and there have been no interval changes to the documented history and physical.    The risks, benefits, and alternatives have been discussed at length, and the patient is willing to proceed.   

## 2015-08-13 NOTE — Progress Notes (Signed)
Paged MD patient temperature increasing following incentive spirometer exercises and tylenol without any improvement.  Pending orders.

## 2015-08-14 LAB — BASIC METABOLIC PANEL
ANION GAP: 6 (ref 5–15)
BUN: 14 mg/dL (ref 6–20)
CALCIUM: 8 mg/dL — AB (ref 8.9–10.3)
CO2: 30 mmol/L (ref 22–32)
Chloride: 101 mmol/L (ref 101–111)
Creatinine, Ser: 0.96 mg/dL (ref 0.61–1.24)
GFR calc Af Amer: >60 mL/min (ref >60)
GFR calc non Af Amer: >60 mL/min (ref >60)
GLUCOSE: 134 mg/dL — AB (ref 65–99)
POTASSIUM: 4.1 mmol/L (ref 3.5–5.1)
Sodium: 137 mmol/L (ref 135–145)

## 2015-08-14 LAB — CBC
HEMATOCRIT: 36.1 % — AB (ref 40.0–52.0)
Hemoglobin: 12.5 g/dL — ABNORMAL LOW (ref 13.0–18.0)
MCH: 33.1 pg (ref 26.0–34.0)
MCHC: 34.6 g/dL (ref 32.0–36.0)
MCV: 95.6 fL (ref 80.0–100.0)
PLATELETS: 167 10*3/uL (ref 150–440)
RBC: 3.77 MIL/uL — ABNORMAL LOW (ref 4.40–5.90)
RDW: 12.3 % (ref 11.5–14.5)
WBC: 12.2 10*3/uL — AB (ref 3.8–10.6)

## 2015-08-14 LAB — GLUCOSE, CAPILLARY: Glucose-Capillary: 119 mg/dL — ABNORMAL HIGH (ref 65–99)

## 2015-08-14 MED ORDER — OXYCODONE HCL 5 MG PO TABS: 5.0000 mg | ORAL_TABLET | ORAL | Status: DC | PRN

## 2015-08-14 MED ORDER — ASPIRIN 325 MG PO TBEC: 325.0000 mg | DELAYED_RELEASE_TABLET | Freq: Every day | ORAL | Status: DC

## 2015-08-14 NOTE — Progress Notes (Signed)
Subjective:  POD #1 s/p right THA.  Patient reports pain as mild.  Patient is up out of bed to a chair.  He has no other complaints.  Objective:   VITALS:   Filed Vitals:   08/13/15 1710 08/13/15 1930 08/14/15 0408 08/14/15 0718  BP:  123/60 99/48 105/43  Pulse:  78 67 66  Temp: 99.5 F (37.5 C) 100.5 F (38.1 C) 100.9 F (38.3 C) 99.8 F (37.7 C)  TempSrc: Tympanic Oral Oral Oral  Resp:  19 19 18   Height:      Weight:      SpO2:  94% 91% 92%    PHYSICAL EXAM:   Right lower extremity: Patient's dressing has scant serosanguineous drainage. His thigh and leg compartments are soft and compressible. He is wearing TED stockings and foot pumps.  Patient has intact sensation light touch in palpable pedal pulses. He has 5 out of 5 strength to ankle dorsiflexion and plantarflexion can flex and extend his toes.   LABS  Results for orders placed or performed during the hospital encounter of 08/13/15 (from the past 24 hour(s))  CBC     Status: Abnormal   Collection Time: 08/14/15  4:12 AM  Result Value Ref Range   WBC 12.2 (H) 3.8 - 10.6 K/uL   RBC 3.77 (L) 4.40 - 5.90 MIL/uL   Hemoglobin 12.5 (L) 13.0 - 18.0 g/dL   HCT 36.1 (L) 40.0 - 52.0 %   MCV 95.6 80.0 - 100.0 fL   MCH 33.1 26.0 - 34.0 pg   MCHC 34.6 32.0 - 36.0 g/dL   RDW 12.3 11.5 - 14.5 %   Platelets 167 150 - 440 K/uL  Basic metabolic panel     Status: Abnormal   Collection Time: 08/14/15  4:12 AM  Result Value Ref Range   Sodium 137 135 - 145 mmol/L   Potassium 4.1 3.5 - 5.1 mmol/L   Chloride 101 101 - 111 mmol/L   CO2 30 22 - 32 mmol/L   Glucose, Bld 134 (H) 65 - 99 mg/dL   BUN 14 6 - 20 mg/dL   Creatinine, Ser 0.96 0.61 - 1.24 mg/dL   Calcium 8.0 (L) 8.9 - 10.3 mg/dL   GFR calc non Af Amer >60 >60 mL/min   GFR calc Af Amer >60 >60 mL/min   Anion gap 6 5 - 15  Glucose, capillary     Status: Abnormal   Collection Time: 08/14/15  7:17 AM  Result Value Ref Range   Glucose-Capillary 119 (H) 65 - 99 mg/dL   Comment 1 Notify RN     Dg Pelvis Portable  08/13/2015  CLINICAL DATA:  Portable postoperative view of the mid and lower pelvis and the hips. EXAM: PORTABLE PELVIS 1-2 VIEWS COMPARISON:  Right hip AP portable view of June 16, 2015 FINDINGS: The patient has undergone right hip prosthesis placement. Radiographic positioning of the prosthetic components is good. The interface with the native bone appears normal. IMPRESSION: The patient is status post right total hip joint replacement. There is no evidence of immediate postprocedure complication. Electronically Signed   By: David  Martinique M.D.   On: 08/13/2015 11:17   Dg Hip Port Unilat With Pelvis 1v Right  08/13/2015  CLINICAL DATA:  Postop EXAM: DG HIP (WITH OR WITHOUT PELVIS) 1V PORT RIGHT COMPARISON:  04/20/2015 FINDINGS: Right total hip arthroplasty is in place. Anatomic alignment of the osseous and prostatic structures. No breakage or loosening of the hardware. IMPRESSION: Right total hip arthroplasty  anatomically aligned. Electronically Signed   By: Marybelle Killings M.D.   On: 08/13/2015 11:56    Assessment/Plan: 1 Day Post-Op   Active Problems:   H/O total hip arthroplasty  Patient doing well postop. His Foley catheter has been removed. His pain is well-controlled. He is up out of bed to a chair and did well with physical therapy this morning. Continue PT and OT. Recheck labs in the morning. Encourage incentive spirometry while awake. Patient will remain on posterior hip precautions.   [name] , MD 08/14/2015, 1:04 PM

## 2015-08-14 NOTE — Evaluation (Signed)
Physical Therapy Evaluation Patient Details Name: Christopher Jenkins MRN: [id number] DOB: 1927-04-13 Today's Date: 08/14/2015   History of Present Illness  Pt started having real issues with R hip in Oct 2016, needing total hip replacement.  Clinical Impression  Pt is motivated to work with PT and shows good effort with exercises and ambulation. He struggles with bed mobility and despite much cuing leans heavily forward with the walker and is unable to use hit R LE as well as he would like.  Pt reports minimal pain at rest and though he does not indicate much pain does report pain with WBing/standing.    Follow Up Recommendations SNF (pt would like to go home if he improves enough)    Equipment Recommendations       Recommendations for Other Services       Precautions / Restrictions Precautions Precautions: Posterior Hip Restrictions Weight Bearing Restrictions: Yes RLE Weight Bearing: Partial weight bearing      Mobility  Bed Mobility Overal bed mobility: Needs Assistance Bed Mobility: Supine to Sit     Supine to sit: Mod assist     General bed mobility comments: Pt shows good effort getting to EOB, does need considerable assist and cues to get to EOB  Transfers Overall transfer level: Needs assistance Equipment used: Rolling walker (2 wheeled) Transfers: Sit to/from Stand Sit to Stand: Min assist         General transfer comment: Pt again shows good effort in getting to standing, needs cues for hand placement and reminders for prcautions during transitions.  Ambulation/Gait Ambulation/Gait assistance: Min assist Ambulation Distance (Feet): 15 Feet Assistive device: Rolling walker (2 wheeled)       General Gait Details: Pt is able to ambulate with walker, but despite much cuing does not stay inside the walker well (leaning forward and keeping it too far ahead) and though he is able to advance his LEs he appears uncomfortable with WBing and limps on the  L.  Stairs            Wheelchair Mobility    Modified Rankin (Stroke Patients Only)       Balance                                             Pertinent Vitals/Pain Pain Assessment:  (very little pain at rest, increases with ambulation/activity)    Home Living Family/patient expects to be discharged to:: Private residence Living Arrangements: Spouse/significant other Available Help at Discharge: Family Type of Home: Apartment Home Access: Level entry       Home Equipment: Environmental consultant - 2 wheels      Prior Function Level of Independence: Independent         Comments: Pt has been using a walking stick for a few months secondary to hip pain     Hand Dominance        Extremity/Trunk Assessment   Upper Extremity Assessment: Defer to OT evaluation           Lower Extremity Assessment: RLE deficits/detail RLE Deficits / Details: Pt shows good effort trying to move R LE.  grossly 3/5 in hip, WFL in knee and ankle       Communication   Communication: HOH  Cognition Arousal/Alertness: Awake/alert Behavior During Therapy: WFL for tasks assessed/performed (stoic) Overall Cognitive Status: Within Functional Limits for tasks assessed  General Comments      Exercises Total Joint Exercises Ankle Circles/Pumps: AROM;10 reps Quad Sets: Strengthening;10 reps Gluteal Sets: Strengthening;10 reps Heel Slides: 10 reps;AROM;Strengthening Hip ABduction/ADduction: AROM;10 reps      Assessment/Plan    PT Assessment Patient needs continued PT services  PT Diagnosis Difficulty walking;Generalized weakness   PT Problem List Decreased strength;Decreased range of motion;Decreased activity tolerance;Decreased balance;Decreased mobility;Decreased coordination;Decreased knowledge of use of DME;Decreased safety awareness;Pain  PT Treatment Interventions DME instruction;Stair training;Gait training;Functional mobility  training;Therapeutic activities;Therapeutic exercise;Balance training;Neuromuscular re-education   PT Goals (Current goals can be found in the Care Plan section) Acute Rehab PT Goals Patient Stated Goal: "I really would like to go home if I can." PT Goal Formulation: With patient/family Time For Goal Achievement: 08/28/15 Potential to Achieve Goals: Fair    Frequency BID   Barriers to discharge        Co-evaluation               End of Session Equipment Utilized During Treatment: Gait belt Activity Tolerance: Patient limited by fatigue Patient left: with call bell/phone within reach;with chair alarm set;with family/visitor present           Time: 0930-0959 PT Time Calculation (min) (ACUTE ONLY): 29 min   Charges:   PT Evaluation $PT Eval Low Complexity: 1 Procedure PT Treatments $Therapeutic Exercise: 8-22 mins   PT G Codes:       Wayne Both, PT, DPT [phone]  Kreg Shropshire 08/14/2015, 11:38 AM

## 2015-08-14 NOTE — Evaluation (Signed)
Occupational Therapy Evaluation Patient Details Name: Christopher Jenkins MRN: [id number] DOB: April 09, 1927 Today's Date: 08/14/2015    History of Present Illness This patient is an 82 year old male who came to [facility] for a R THR (posterior).   Clinical Impression   This patient is an 82 year old male who came to [facility] for a R total hip replacement (posterior approach).  Patient lives in independent living at [facility] with his wife.  He had been independent with ADL and functional mobility quitting golf only recently and very recently using a walking stick. He now has deficits with pain, mobility, and activities of daily living and would benefit from Occupational Therapy for ADL/functional mobility training while .staying within hip precautions (posterior approach).       Follow Up Recommendations  SNF    Equipment Recommendations       Recommendations for Other Services       Precautions / Restrictions Precautions Precautions: Posterior Hip;Fall Restrictions Weight Bearing Restrictions: Yes RLE Weight Bearing: Partial weight bearing      Mobility Bed Mobility  Transfers    Balance                                            ADL                                         General ADL Comments: Had been independent, played golf until recently, started using a walking stick.  Today patient practiced techniques for lower body dressing using hip kit to stay within hip precautions (posterior approach). Patient needed set up and minimal/moderate assist to Donned/doffed socks and pants to knees. He needed verbal cues for technique and safety. Patient needed 2 physical cues to stay within hip precautions during lower body dressing. Given written list of vendors to obtain hip kit.      Vision     Perception     Praxis      Pertinent Vitals/Pain Pain Assessment:  (No pain at rest minimal with some movement.)      Hand Dominance     Extremity/Trunk Assessment Upper Extremity Assessment Upper Extremity Assessment: Overall WFL for tasks assessed   Lower Extremity Assessment Lower Extremity Assessment: Defer to PT evaluation RLE Deficits / Details: Pt shows good effort trying to move R LE.  grossly 3/5 in hip, WFL in knee and ankle       Communication Communication Communication: HOH   Cognition Arousal/Alertness: Awake/alert Behavior During Therapy: WFL for tasks assessed/performed Overall Cognitive Status: Within Functional Limits for tasks assessed                     General Comments       Exercises Exercises: Total Joint     Shoulder Instructions      Home Living Family/patient expects to be discharged to:: Private residence Living Arrangements: Spouse/significant other Available Help at Discharge: Family Type of Home: Apartment Home Access: Level entry                     Home Equipment: Walker - 2 wheels          Prior Functioning/Environment Level of Independence: Independent  Comments: Pt has been using a walking stick for a few months secondary to hip pain    OT Diagnosis: Acute pain   OT Problem List: Decreased strength;Decreased range of motion;Decreased activity tolerance;Impaired balance (sitting and/or standing);Decreased knowledge of use of DME or AE;Decreased knowledge of precautions;Pain   OT Treatment/Interventions: Self-care/ADL training    OT Goals(Current goals can be found in the care plan section) Acute Rehab OT Goals Patient Stated Goal: Would rather go home. OT Goal Formulation: With patient/family Time For Goal Achievement: 08/28/15 Potential to Achieve Goals: Good  OT Frequency: Min 1X/week   Barriers to D/C:            Co-evaluation              End of Session Equipment Utilized During Treatment:  (hip kit)  Activity Tolerance:   Patient left: in bed;with call bell/phone within reach;with chair  alarm set;with family/visitor present   Time: 1050-1115 OT Time Calculation (min): 25 min Charges:  OT General Charges $OT Visit: 1 Procedure OT Evaluation $OT Eval Low Complexity: 1 Procedure OT Treatments $Self Care/Home Management : 8-22 mins G-Codes:    Myrene Galas, MS/OTR/L  08/14/2015, 11:51 AM

## 2015-08-14 NOTE — Discharge Summary (Signed)
Physician Discharge Summary  Patient ID: Christopher Jenkins MRN: [id number] DOB/AGE: August 02, 1926 82 y.o.  Admit date: 08/13/2015 Discharge date: 08/15/15  Admission Diagnoses:  unilateral right hip primary osteoarthritis  Discharge Diagnoses:  unilateral right hip primary osteoarthritis Active Problems:   H/O total hip arthroplasty   Past Medical History  Diagnosis Date  . Hypertrophy (benign) of prostate     asymptomatic on finasteride. will f/up with urologist [name]  . Hyperlipidemia   . Hypertension   . Tubular adenoma of colon   . Obesity   . Elevated PSA   . BPH (benign prostatic hyperplasia)   . Urinary retention   . Gross hematuria   . Duodenal mass   . Overweight   . Kidney lesion 07/2015    pt has appt at kidney specialist 08/05/15 to review CT scan.    Surgeries: Procedure(s): TOTAL HIP ARTHROPLASTY on 08/13/2015   Consultants (if any):    Discharged Condition: Improved  Hospital Course: Christopher Jenkins is an 82 y.o. male who was admitted 08/13/2015 with a diagnosis of  unilateral right hip primary osteoarthritis and went to the operating room on 08/13/2015 and underwent an uncomplicated right total hip arthroplasty.    He was given perioperative antibiotics:  Anti-infectives    Start     Dose/Rate Route Frequency Ordered Stop   08/13/15 1230  ceFAZolin (ANCEF) IVPB 2 g/50 mL premix     2 g 100 mL/hr over 30 Minutes Intravenous Every 6 hours 08/13/15 1223 08/13/15 2158   08/13/15 0615  clindamycin (CLEOCIN) IVPB 600 mg     600 mg 100 mL/hr over 30 Minutes Intravenous  Once 08/13/15 0603 08/13/15 0811   08/13/15 0603  ceFAZolin (ANCEF) IVPB 2 g/50 mL premix     2 g 100 mL/hr over 30 Minutes Intravenous On call to O.R. 08/13/15 [id number] 08/13/15 [id number]    . Patient was admitted to the orthopedic floor following his operation. He began physical and occupational therapy on postop day 1. He completed 24 hours of postop antibiotics. His Foley catheter was removed  on postop day #1. Patient's pain was well controlled postoperatively. He was up out of bed to a chair on postop day 1 and continued to improve throughout his hospitalization with therapy. Labs drawn postoperatively showed his hemoglobin and hematocrit remained stable.  He was given sequential compression devices, early ambulation, and plavix and aspirin for DVT prophylaxis.  He benefited maximally from the hospital stay and there were no complications.    Recent vital signs:  Filed Vitals:   08/14/15 0408 08/14/15 0718  BP: 99/48 105/43  Pulse: 67 66  Temp: 100.9 F (38.3 C) 99.8 F (37.7 C)  Resp: 19 18    Recent laboratory studies:  Lab Results  Component Value Date   HGB 12.5* 08/14/2015   HGB 14.5 07/29/2015   HGB 14.6 06/16/2015   Lab Results  Component Value Date   WBC 12.2* 08/14/2015   PLT 167 08/14/2015   Lab Results  Component Value Date   INR 1.13 07/29/2015   Lab Results  Component Value Date   NA 137 08/14/2015   K 4.1 08/14/2015   CL 101 08/14/2015   CO2 30 08/14/2015   BUN 14 08/14/2015   CREATININE 0.96 08/14/2015   GLUCOSE 134* 08/14/2015    Discharge Medications:     Medication List    STOP taking these medications        meloxicam 7.5 MG tablet  Commonly known as:  MOBIC     traMADol 50 MG tablet  Commonly known as:  ULTRAM      TAKE these medications        aspirin 325 MG EC tablet  Take 1 tablet (325 mg total) by mouth daily.     atorvastatin 40 MG tablet  Commonly known as:  LIPITOR  Take 1 tablet (40 mg total) by mouth daily.     BYSTOLIC 2.5 MG tablet  Generic drug:  nebivolol  TAKE 1 TABLET (2.5 MG TOTAL) BY MOUTH DAILY.     Calcium-Vitamin D 600-125 MG-UNIT Tabs  Take 1 tablet by mouth daily at 2 PM.     clopidogrel 75 MG tablet  Commonly known as:  PLAVIX  Take 1 tablet (75 mg total) by mouth daily.     docusate 50 MG/5ML liquid  Commonly known as:  COLACE  3 drops in each ear daily to prevent occlusion with wax      finasteride 5 MG tablet  Commonly known as:  PROSCAR  TAKE 1 TABLET BY MOUTH EVERY DAY     fish oil-omega-3 fatty acids 1000 MG capsule  Take 1 g by mouth daily. At 1400     hydrochlorothiazide 25 MG tablet  Commonly known as:  HYDRODIURIL  Take 1 tablet (25 mg total) by mouth daily.     memantine 10 MG tablet  Commonly known as:  NAMENDA  TAKE 1 TABLET (10 MG TOTAL) BY MOUTH 2 (TWO) TIMES DAILY.     multivitamin with minerals tablet  Take 1 tablet by mouth daily. At 1400     niacin 250 MG tablet  Take 250 mg by mouth daily. At 1400     oxyCODONE 5 MG immediate release tablet  Commonly known as:  Oxy IR/ROXICODONE  Take 1-2 tablets (5-10 mg total) by mouth every 4 (four) hours as needed for breakthrough pain ((for MODERATE breakthrough pain)).     VITAMIN D3 PO  Take 1 tablet by mouth daily. At 1400        Diagnostic Studies: Ct Abdomen Pelvis W Wo Contrast  08/05/2015  CLINICAL DATA:  Evaluate for renal mass. EXAM: CT ABDOMEN AND PELVIS WITHOUT AND WITH CONTRAST TECHNIQUE: Multidetector CT imaging of the abdomen and pelvis was performed following the standard protocol before and following the bolus administration of intravenous contrast. CONTRAST:  129mL OMNIPAQUE IOHEXOL 350 MG/ML SOLN COMPARISON:  05/08/2015 FINDINGS: Lower chest: No pleural effusion identified. The lung bases are clear. Hepatobiliary: Hepatic steatosis noted. Subcapsular lesion within the medial segment of left lobe of liver is unchanged measuring 1.5 cm. Also stable is a low density structure within the per free of the right lobe of liver measuring 8 mm. Stable lateral segment of left lobe of liver hypodensity measuring 6 mm. The gallbladder appears within normal limits. There is no biliary dilatation. Pancreas: Normal Spleen: Normal appearance of the spleen. Adrenals/Urinary Tract: The adrenal glands are both unremarkable. Bilateral nephrolithiasis noted. Within the inferior pole of the right kidney there  is a 3 mm nonobstructing calculus. Within the mid left kidney there is a stone which measures 3 mm. But says it Bilateral renal cysts are noted. The largest left renal cyst measures 6.8 cm. The largest right renal cyst measures 7.7 cm. This contains a thin, hairline nonenhancing septation. No mural nodularity or septation identified. The previously described Bosniak category 84F lesion arising from the inferior pole of the left kidney is again noted and is unchanged measuring 11 mm, image 76  of series 5. On the delayed images there is symmetric excretion of contrast material by both kidneys. The urinary bladder appears within normal limits. Stomach/Bowel: The stomach is within normal limits. Diverticula arising from the heart zonal portion of the duodenum noted. The small bowel loops have a normal course and caliber. No obstruction. Normal appearance of the colon. Vascular/Lymphatic: Calcified atherosclerotic disease involves the abdominal aorta. No aneurysm. No enlarged retroperitoneal or mesenteric adenopathy. No enlarged pelvic or inguinal lymph nodes. Reproductive: Marked enlargement of the prostate gland is identified. Other: No free fluid or fluid collections within the abdomen or pelvis. Musculoskeletal: Degenerative disc disease is present within the lumbar spine. There is an anterolisthesis of L4 on L5. IMPRESSION: 1. Stable bilateral renal cysts of varying complexity ease including Bosniak categories 1, 2 and 77F. A 12 month followup examination of Bosniak category 77F lesions is recommended. 2. Nonobstructing renal calculi. 3. Aortic atherosclerosis. 4. Stable low-attenuation foci within the liver. 5. Hepatic steatosis. Electronically Signed   By: Kerby Moors M.D.   On: 08/05/2015 14:54   Chest 2 View  07/29/2015  CLINICAL DATA:  Preop hip replacement EXAM: CHEST  2 VIEW COMPARISON:  01/22/2010 FINDINGS: COPD with hyperinflation of the lungs. Heart size upper normal. Negative for heart failure. Lungs  are clear without infiltrate or effusion. Nodular density right lung base compatible with nipple shadow. IMPRESSION: COPD without acute abnormality. Electronically Signed   By: Franchot Gallo M.D.   On: 07/29/2015 13:08   Dg Pelvis Portable  08/13/2015  CLINICAL DATA:  Portable postoperative view of the mid and lower pelvis and the hips. EXAM: PORTABLE PELVIS 1-2 VIEWS COMPARISON:  Right hip AP portable view of June 16, 2015 FINDINGS: The patient has undergone right hip prosthesis placement. Radiographic positioning of the prosthetic components is good. The interface with the native bone appears normal. IMPRESSION: The patient is status post right total hip joint replacement. There is no evidence of immediate postprocedure complication. Electronically Signed   By: David  Martinique M.D.   On: 08/13/2015 11:17   Dg Hip Port Unilat With Pelvis 1v Right  08/13/2015  CLINICAL DATA:  Postop EXAM: DG HIP (WITH OR WITHOUT PELVIS) 1V PORT RIGHT COMPARISON:  04/20/2015 FINDINGS: Right total hip arthroplasty is in place. Anatomic alignment of the osseous and prostatic structures. No breakage or loosening of the hardware. IMPRESSION: Right total hip arthroplasty anatomically aligned. Electronically Signed   By: Marybelle Killings M.D.   On: 08/13/2015 11:56    Disposition: Final discharge disposition not confirmed      Discharge Instructions    Call MD / Call 911    Complete by:  As directed   If you experience chest pain or shortness of breath, CALL 911 and be transported to the hospital emergency room.  If you develope a fever above 101 F, pus (white drainage) or increased drainage or redness at the wound, or calf pain, call your surgeon's office.     Change dressing    Complete by:  As directed   Right hip dressing may be changed as needed.     Constipation Prevention    Complete by:  As directed   Drink plenty of fluids.  Prune juice may be helpful.  You may use a stool softener, such as Colace (over the  counter) 100 mg twice a day.  Use MiraLax (over the counter) for constipation as needed.     Diet general    Complete by:  As directed  Discharge instructions    Complete by:  As directed   Patient will be discharged home on posterior hip precautions. Patient will remain partial weightbearing on the operative extremity for 4 weeks postop. They will use a walker for assistance with ambulation. Patient will continue TED stockings until follow-up in the office. Patient should continue physical therapy for gait training, lower extremity strengthening and hip range of motion.     Driving restrictions    Complete by:  As directed   No driving for [id number] weeks     Follow the hip precautions as taught in Physical Therapy    Complete by:  As directed      Increase activity slowly as tolerated    Complete by:  As directed      Lifting restrictions    Complete by:  As directed   No lifting for 12 weeks     TED hose    Complete by:  As directed   Use stockings (TED hose) for 2 weeks on both leg(s).  You may remove them at night for sleeping.           Follow-up Information    Follow up with [facility] .   Specialties:  [place], [place]   Contact information:   [place] [phone]       Signed: [name] ,MD 08/14/2015, 1:16 PM

## 2015-08-14 NOTE — Discharge Instructions (Signed)
Patient will be discharged home on posterior hip precautions. Patient will remain partial weightbearing on the operative extremity for 4 weeks postop. They will use a walker for assistance with ambulation. Patient will continue TED stockings until follow-up in the office. Patient should continue physical therapy for gait training, lower extremity strengthening and hip range of motion.

## 2015-08-14 NOTE — Progress Notes (Signed)
Pt due to void, with no urine output by 14:00. Pt bladder scanned, 604 ml, MD Krasinski notified. Per MD place foley until tomm morning. Foley inserted, 750 ml output. Will continue to monitor.

## 2015-08-14 NOTE — Care Management Important Message (Signed)
Important Message  Patient Details  Name: Christopher Jenkins MRN: [id number] Date of Birth: 1926-07-18   Medicare Important Message Given:  Yes    [name], RN 08/14/2015, 7:22 AM

## 2015-08-14 NOTE — Care Management Note (Signed)
Case Management Note  Patient Details  Name: RODGERS STOLTE MRN: [id number] Date of Birth: 06/20/1927  Subjective/Objective:       Lovenox co-pay at [facility] [address] is $5.00.             Action/Plan:   Expected Discharge Date:  08/16/15               Expected Discharge Plan:     In-House Referral:     Discharge planning Services     Post Acute Care Choice:    Choice offered to:     DME Arranged:    DME Agency:     HH Arranged:    HH Agency:     Status of Service:     Medicare Important Message Given:  Yes Date Medicare IM Given:    Medicare IM give by:    Date Additional Medicare IM Given:    Additional Medicare Important Message give by:     If discussed at [place] of Stay Meetings, dates discussed:    Additional Comments:  Shanell Aden A, RN 08/14/2015, 3:41 PM

## 2015-08-14 NOTE — NC FL2 (Signed)
[place] LEVEL OF CARE SCREENING TOOL     IDENTIFICATION  Patient Name: Christopher Jenkins Birthdate: Nov 27, 1926 Sex: male Admission Date (Current Location): 08/13/2015  [place] and [place] Number:  Engineering geologist and Address:  [facility], [address] [place],  [address]      Provider Number: [id number]  Attending Physician Name and Address:  [name], MD  Relative Name and Phone Number:       Current Level of Care: Hospital Recommended Level of Care: [place] Prior Approval Number:    Date Approved/Denied:   PASRR Number:  ([id number])  Discharge Plan: SNF    Current Diagnoses: Patient Active Problem List   Diagnosis Date Noted  . H/O total hip arthroplasty 08/13/2015  . Renal mass 05/20/2015  . Gross hematuria 05/01/2015  . Chronic right hip pain 04/11/2015  . Pain of right lower extremity 04/08/2015  . Transient diplopia 03/29/2015  . BPH with obstruction/lower urinary tract symptoms 03/12/2015  . Peripheral artery disease ([place]) 02/24/2015  . Constipation 05/07/2014  . Basal cell carcinoma of ala nasi 01/06/2014  . OSA (obstructive sleep apnea) 02/24/2013  . Hyponatremia 12/07/2012  . Dementia arising in the senium and presenium 06/10/2012  . Do not resuscitate discussion 06/06/2012  . Tubular adenoma of colon 02/08/2012  . Hematuria, gross 02/08/2012  . Obesity (BMI 30-39.9) 12/17/2011  . Screening for colon cancer 05/13/2011  . Hypertrophy (benign) of prostate   . Hyperlipidemia   . Hypertension     Orientation RESPIRATION BLADDER Height & Weight     Self, Time, Place, Situation  Normal Continent Weight: 202 lb (91.627 kg) Height:  5\' 8"  (172.7 cm)  BEHAVIORAL SYMPTOMS/MOOD NEUROLOGICAL BOWEL NUTRITION STATUS   (none )  (none ) Continent Diet (Diet: Heart Healthy )  AMBULATORY STATUS COMMUNICATION OF NEEDS Skin   Extensive Assist Verbally Surgical wounds (Incision: Right  Hip. )                       Personal Care Assistance Level of Assistance  Bathing, Feeding, Dressing Bathing Assistance: Limited assistance Feeding assistance: Independent Dressing Assistance: Limited assistance     Functional Limitations Info  Sight, Hearing, Speech Sight Info: Adequate Hearing Info: Adequate Speech Info: Adequate    SPECIAL CARE FACTORS FREQUENCY  PT (By licensed PT), OT (By licensed OT)     PT Frequency:  (5) OT Frequency:  (5)            Contractures      Additional Factors Info  Code Status Code Status Info:  (Full Code. )             Current Medications (08/14/2015):  This is the current hospital active medication list Current Facility-Administered Medications  Medication Dose Route Frequency Provider Last Rate Last Dose  . 0.9 %  sodium chloride infusion  75 mL/hr Intravenous Continuous [name], MD 75 mL/hr at 08/13/15 1420 75 mL/hr at 08/13/15 1420  . acetaminophen (TYLENOL) tablet 650 mg  650 mg Oral Q6H PRN [name], MD   650 mg at 08/13/15 1615   Or  . acetaminophen (TYLENOL) suppository 650 mg  650 mg Rectal Q6H PRN [name], MD      . alum & mag hydroxide-simeth (MAALOX/MYLANTA) 200-200-20 MG/5ML suspension 30 mL  30 mL Oral Q4H PRN [name], MD      . aspirin EC tablet 325 mg  325 mg Oral BID PC Lennette Bihari  Mack Guise, MD   325 mg at 08/13/15 1733  . atorvastatin (LIPITOR) tablet 40 mg  40 mg Oral Daily [name], MD   40 mg at 08/13/15 1230  . bisacodyl (DULCOLAX) suppository 10 mg  10 mg Rectal Daily PRN [name], MD      . calcium-vitamin D (OSCAL WITH D) 500-200 MG-UNIT per tablet 1 tablet  1 tablet Oral Q1400 [name], MD   1 tablet at 08/13/15 1400  . cholecalciferol (VITAMIN D) tablet 1,000 Units  1,000 Units Oral Q1400 [name], MD   1,000 Units at 08/13/15 1400  . clopidogrel (PLAVIX) tablet 75 mg  75 mg Oral Daily [name], MD   75 mg at 08/13/15 1230  . docusate  sodium (COLACE) capsule 100 mg  100 mg Oral BID [name], MD   100 mg at 08/13/15 2058  . finasteride (PROSCAR) tablet 5 mg  5 mg Oral Daily [name], MD   5 mg at 08/13/15 1230  . hydrochlorothiazide (HYDRODIURIL) tablet 25 mg  25 mg Oral Daily [name], MD   25 mg at 08/13/15 1230  . magnesium citrate solution 1 Bottle  1 Bottle Oral Once PRN [name], MD      . memantine [facility]) tablet 10 mg  10 mg Oral Daily [name], MD   10 mg at 08/13/15 1615  . menthol-cetylpyridinium (CEPACOL) lozenge 3 mg  1 lozenge Oral PRN [name], MD       Or  . phenol (CHLORASEPTIC) mouth spray 1 spray  1 spray Mouth/Throat PRN [name], MD      . methocarbamol (ROBAXIN) tablet 500 mg  500 mg Oral Q6H PRN [name], MD       Or  . methocarbamol (ROBAXIN) 500 mg in dextrose 5 % 50 mL IVPB  500 mg Intravenous Q6H PRN [name], MD      . morphine 2 MG/ML injection 2 mg  2 mg Intravenous Q2H PRN [name], MD      . multivitamin with minerals tablet 1 tablet  1 tablet Oral Q1400 [name], MD   1 tablet at 08/13/15 1400  . nebivolol (BYSTOLIC) tablet 2.5 mg  2.5 mg Oral Daily [name], MD   2.5 mg at 08/13/15 1230  . niacin tablet 250 mg  250 mg Oral Q1400 [name], MD   250 mg at 08/13/15 1400  . omega-3 acid ethyl esters (LOVAZA) capsule 1 g  1 g Oral Q1400 [name], MD   1 g at 08/13/15 1400  . ondansetron (ZOFRAN) tablet 4 mg  4 mg Oral Q6H PRN [name], MD       Or  . ondansetron [facility]) injection 4 mg  4 mg Intravenous Q6H PRN [name], MD      . oxyCODONE (Oxy IR/ROXICODONE) immediate release tablet 5-10 mg  5-10 mg Oral Q4H PRN [name], MD   10 mg at 08/13/15 2100  . polyethylene glycol (MIRALAX / GLYCOLAX) packet 17 g  17 g Oral Daily PRN [name], MD      . [facility]) tablet 8.6 mg  1 tablet Oral BID [name], MD   8.6 mg at 08/13/15 2059     Discharge  Medications: Please see discharge summary for a list of discharge medications.  Relevant Imaging Results:  Relevant Lab Results:   Additional Information  (SSN: [id number])  Loralyn Freshwater, LCSW

## 2015-08-14 NOTE — Progress Notes (Signed)
Plan is for patient to D/C to [facility] Sunday 08/16/15. Per Seth Bake admissions coordinator at [facility] patient will go to room 228. RN will call report at [phone]. Clinical Education officer, museum (CSW) sent D/C Summary, FL2, D/C Packet and prescriptions to [facility] today. Medicare Bundle Case Manager aware of above. Patient, wife and daughter are aware of above. CSW will continue to follow and assist as needed.   Blima Rich, LCSW [phone]

## 2015-08-14 NOTE — Clinical Social Work Placement (Signed)
   CLINICAL SOCIAL WORK PLACEMENT  NOTE  Date:  08/14/2015  Patient Details  Name: Christopher Jenkins MRN: [id number] Date of Birth: 02-21-1927  Clinical Social Work is seeking post-discharge placement for this patient at the [place] level of care (*CSW will initial, date and re-position this form in  chart as items are completed):  Yes   Patient/family provided with [place] Work Department's list of facilities offering this level of care within the geographic area requested by the patient (or if unable, by the patient's family).  Yes   Patient/family informed of their freedom to choose among providers that offer the needed level of care, that participate in Medicare, Medicaid or managed care program needed by the patient, have an available bed and are willing to accept the patient.  Yes   Patient/family informed of [facility]'s ownership interest in [facility] and [facility], as well as of the fact that they are under no obligation to receive care at these facilities.  PASRR submitted to EDS on 08/14/15     PASRR number received on 08/14/15     Existing PASRR number confirmed on       FL2 transmitted to all facilities in geographic area requested by pt/family on 08/14/15     FL2 transmitted to all facilities within larger geographic area on       Patient informed that his/her managed care company has contracts with or will negotiate with certain facilities, including the following:        Yes   Patient/family informed of bed offers received.  Patient chooses bed at  [facility] )     Physician recommends and patient chooses bed at      Patient to be transferred to   on  .  Patient to be transferred to facility by       Patient family notified on   of transfer.  Name of family member notified:        PHYSICIAN       Additional Comment:    _______________________________________________ Loralyn Freshwater, LCSW 08/14/2015,  12:35 PM

## 2015-08-14 NOTE — Clinical Social Work Note (Signed)
Clinical Social Work Assessment  Patient Details  Name: Christopher Jenkins MRN: [id number] Date of Birth: [birth year]  Date of referral:  08/14/15               Reason for consult:  Facility Placement, Other (Comment Required) (From [place] )                Permission sought to share information with:  Chartered certified accountant granted to share information::  Yes, Verbal Permission Granted  Name::      Chief Executive Officer::   [place]   Relationship::     Contact Information:     Housing/Transportation Living arrangements for the past 2 months:  Charity fundraiser of Information:  Patient, Adult Children, Spouse Patient Interpreter Needed:  None Criminal Activity/Legal Involvement Pertinent to Current Situation/Hospitalization:  No - Comment as needed Significant Relationships:  Adult Children, Spouse Lives with:  Facility Resident, Spouse Do you feel safe going back to the place where you live?  Yes Need for family participation in patient care:  Yes (Comment)  Care giving concerns:  Patient is an independent living resident at [facility].    Social Worker assessment / plan:  Holiday representative (CSW) received SNF consult. PT is recommending SNF. Patient is a Medicare Bundle Patient. CSW met with patient to discuss D/C plan. Patient's wife and daughter Christopher Jenkins were at bedside. Patient was alert and oriented and was sitting in the chair. CSW introduced self and explained role of CSW department. CSW also explained Medicare Bundle Program. Patient reported that him and his wife live in the [place] in the [place] at [facility] CSW explained that PT is recommending SNF at this time however patient may progress to home health. Per patient he prefers to go to SNF at [facility]. Medicare Bundle Case Manager notified.    Per Zacarias Pontes admissions coordinator patient can come to [facility] over the  weekend if D/C orders can be sent today. CSW made MD aware of the request. CSW will continue to follow and assist as needed.   Employment status:  Retired Forensic scientist:  Medicare PT Recommendations:  [place] / Referral to community resources:  [place]  Patient/Family's Response to care:  Patient is agreeable to going to [facility].   Patient/Family's Understanding of and Emotional Response to Diagnosis, Current Treatment, and Prognosis: Patient and family were pleasant and thanked CSW for visit.   Emotional Assessment Appearance:  Appears stated age Attitude/Demeanor/Rapport:    Affect (typically observed):  Accepting, Adaptable, Pleasant Orientation:  Oriented to Self, Oriented to Place, Oriented to  Time, Oriented to Situation Alcohol / Substance use:  Not Applicable Psych involvement (Current and /or in the community):  No (Comment)  Discharge Needs  Concerns to be addressed:  Discharge Planning Concerns Readmission within the last 30 days:  No Current discharge risk:  Dependent with Mobility Barriers to Discharge:  Continued Medical Work up   Elwyn Reach 08/14/2015, 12:36 PM

## 2015-08-14 NOTE — Progress Notes (Signed)
Physical Therapy Treatment Patient Details Name: Christopher Jenkins MRN: [id number] DOB: [birth year] Today's Date: 08/14/2015    History of Present Illness This patient is an 82 year old male who came to [facility] for a R THR (posterior).    PT Comments    Pt is able to ambulate to the door an back, but still needs assist to keep walker from going out in front of him. Pt's wife is very encouraging and involved, would like him to go home if possible.  Pt is not able to do SLRs, but otherwise shows good strength with LE exercises.  Pt will need to improve mobility/ambulation if he is to be able to go home safely.   Follow Up Recommendations  SNF (still hoping to go home)     Equipment Recommendations       Recommendations for Other Services       Precautions / Restrictions Precautions Precautions: Posterior Hip;Fall Precaution Booklet Issued: Yes (comment) Restrictions RLE Weight Bearing: Partial weight bearing    Mobility  Bed Mobility Overal bed mobility: Needs Assistance Bed Mobility: Sit to Supine       Sit to supine: Mod assist (with R LE only)   General bed mobility comments: Pt is able to control his upper body and L LE getting back into bed, needs assist with R.   Transfers Overall transfer level: Needs assistance Equipment used: Rolling walker (2 wheeled) Transfers: Sit to/from Stand Sit to Stand: Min assist         General transfer comment: Pt needing some assist to initiate movement and keep him from sitting back, but ultimately is able to do most of the rising w/o much assist  Ambulation/Gait Ambulation/Gait assistance: Min assist Ambulation Distance (Feet): 35 Feet Assistive device: Rolling walker (2 wheeled)       General Gait Details: Pt does better keeping the walker from getting to far out in front of him but this requires nearly constant cuing and frequent direct assist to keep the walker close.  Pt very fatigued with the effort (though vitals remain  stable O2 in the mid 90s, HR 70s).     Stairs            Wheelchair Mobility    Modified Rankin (Stroke Patients Only)       Balance                                    Cognition Arousal/Alertness: Awake/alert Behavior During Therapy: WFL for tasks assessed/performed Overall Cognitive Status: Within Functional Limits for tasks assessed (pt does have some general confusion at times)                      Exercises Total Joint Exercises Ankle Circles/Pumps: AROM;10 reps Quad Sets: Strengthening;10 reps Gluteal Sets: Strengthening;10 reps Short Arc Quad: 15 reps;Strengthening Heel Slides: 10 reps;AROM;Strengthening Hip ABduction/ADduction: AROM;10 reps    General Comments        Pertinent Vitals/Pain Pain Assessment: 0-10 Pain Score: 6     Home Living                      Prior Function            PT Goals (current goals can now be found in the care plan section) Progress towards PT goals: Progressing toward goals    Frequency  BID    PT  Plan Current plan remains appropriate    Co-evaluation             End of Session Equipment Utilized During Treatment: Gait belt Activity Tolerance: Patient limited by fatigue Patient left: with call bell/phone within reach;with family/visitor present;with bed alarm set     Time: [id number] PT Time Calculation (min) (ACUTE ONLY): 26 min  Charges:  $Gait Training: 8-22 mins $Therapeutic Exercise: 8-22 mins                    G Codes:     Christopher Jenkins, PT, DPT [phone]  Christopher Jenkins 08/14/2015, 4:00 PM

## 2015-08-15 LAB — CBC
HCT: 33.5 % — ABNORMAL LOW (ref 40.0–52.0)
Hemoglobin: 11.6 g/dL — ABNORMAL LOW (ref 13.0–18.0)
MCH: 32.4 pg (ref 26.0–34.0)
MCHC: 34.5 g/dL (ref 32.0–36.0)
MCV: 94 fL (ref 80.0–100.0)
Platelets: 153 10*3/uL (ref 150–440)
RBC: 3.57 MIL/uL — ABNORMAL LOW (ref 4.40–5.90)
RDW: 12.4 % (ref 11.5–14.5)
WBC: 12.1 10*3/uL — ABNORMAL HIGH (ref 3.8–10.6)

## 2015-08-15 LAB — BASIC METABOLIC PANEL
Anion gap: 4 — ABNORMAL LOW (ref 5–15)
BUN: 16 mg/dL (ref 6–20)
CO2: 33 mmol/L — ABNORMAL HIGH (ref 22–32)
Calcium: 8.6 mg/dL — ABNORMAL LOW (ref 8.9–10.3)
Chloride: 100 mmol/L — ABNORMAL LOW (ref 101–111)
Creatinine, Ser: 0.98 mg/dL (ref 0.61–1.24)
GFR calc Af Amer: >60 mL/min (ref >60)
GFR calc non Af Amer: >60 mL/min (ref >60)
Glucose, Bld: 119 mg/dL — ABNORMAL HIGH (ref 65–99)
Potassium: 3.8 mmol/L (ref 3.5–5.1)
Sodium: 137 mmol/L (ref 135–145)

## 2015-08-15 NOTE — Progress Notes (Signed)
Patient ID: Christopher Jenkins, male   DOB: 30-Sep-1926, 82 y.o.   MRN: [id number] Patient is awake and alert today, with minimal pain.  Tolerating PT well. Some blood proximally on bandage.  Hct 33, WBC 12,100  Continue with present orders, probable discharge tomorrow.

## 2015-08-15 NOTE — Progress Notes (Signed)
Physical Therapy Treatment Patient Details Name: Christopher Jenkins MRN: [id number] DOB: 23-May-1927 Today's Date: 08/15/2015    History of Present Illness This patient is an 82 year old male who came to [facility] for a R THR (posterior).    PT Comments    Pt does much better today with ambulation with increased confidence, speed, cadence and walker use.  He also needs very little assist getting to EOB (with heavy UE rail use).  Pt continues to have some issues with following instruction but overall made significant improvements.  Pt wanting to go home and this could be appropriate if he continues to improve.  Follow Up Recommendations  SNF (pt making improvements, possible he could go home?)     Equipment Recommendations       Recommendations for Other Services       Precautions / Restrictions Precautions Precautions: Posterior Hip;Fall Restrictions Weight Bearing Restrictions: Yes RLE Weight Bearing: Weight bearing as tolerated    Mobility  Bed Mobility Overal bed mobility: Needs Assistance Bed Mobility: Sit to Supine     Supine to sit: Min assist;Min guard     General bed mobility comments: Pt did well getting to sitting at R side of bed. He needed heavy UE use on rails but very little assist with LEs.   Transfers Overall transfer level: Modified independent Equipment used: Rolling walker (2 wheeled) Transfers: Sit to/from Stand Sit to Stand: Min guard         General transfer comment: Pt better able to stand upright with walker and needing less cuing.  He is able to maintain balance and shows improved confidence with the effort today.  Ambulation/Gait Ambulation/Gait assistance: Min assist Ambulation Distance (Feet): 50 Feet Assistive device: Rolling walker (2 wheeled)       General Gait Details: Pt shows considerably better posture, walker use and speed with ambulation today.  He needs much less physical and verbal cues to keep the walker under him and though he  fatigues with the effort of increased distance he was much more efficient and appropriate with gait today.    Stairs            Wheelchair Mobility    Modified Rankin (Stroke Patients Only)       Balance                                    Cognition Arousal/Alertness: Awake/alert Behavior During Therapy: WFL for tasks assessed/performed Overall Cognitive Status: Within Functional Limits for tasks assessed                      Exercises Total Joint Exercises Ankle Circles/Pumps: AROM;10 reps Quad Sets: Strengthening;10 reps Gluteal Sets: Strengthening;10 reps Short Arc Quad: 15 reps;Strengthening Heel Slides: 10 reps;AROM;Strengthening Hip ABduction/ADduction: AROM;10 reps Straight Leg Raises: AROM;10 reps    General Comments        Pertinent Vitals/Pain Pain Score: 6     Home Living                      Prior Function            PT Goals (current goals can now be found in the care plan section) Progress towards PT goals: Progressing toward goals    Frequency  BID    PT Plan Current plan remains appropriate    Co-evaluation  End of Session Equipment Utilized During Treatment: Gait belt Activity Tolerance: Patient limited by fatigue Patient left: with call bell/phone within reach;with family/visitor present;with bed alarm set     Time: [id number] PT Time Calculation (min) (ACUTE ONLY): 25 min  Charges:  $Gait Training: 8-22 mins $Therapeutic Exercise: 8-22 mins                    G Codes:     Wayne Both, PT, DPT [phone]  Kreg Shropshire 08/15/2015, 10:24 AM

## 2015-08-15 NOTE — Care Management Note (Signed)
Case Management Note  Patient Details  Name: KEE DEPA MRN: [id number] Date of Birth: [birth year]  Subjective/Objective:   Mr Bethancourt will discharge to [facility] on Sunday 08/16/15. A referral has been sent to [facility] for [place] [facility].                 Action/Plan:   Expected Discharge Date:  08/16/15               Expected Discharge Plan:     In-House Referral:     Discharge planning Services     Post Acute Care Choice:    Choice offered to:     DME Arranged:    DME Agency:     HH Arranged:    HH Agency:     Status of Service:     Medicare Important Message Given:  Yes Date Medicare IM Given:    Medicare IM give by:    Date Additional Medicare IM Given:    Additional Medicare Important Message give by:     If discussed at [place] of Stay Meetings, dates discussed:    Additional Comments:  Hurshel Bouillon A, RN 08/15/2015, 3:17 PM

## 2015-08-15 NOTE — Progress Notes (Signed)
Physical Therapy Treatment Patient Details Name: ESTUARDO SIELOFF MRN: [id number] DOB: 19-Jun-1927 Today's Date: 08/15/2015    History of Present Illness This patient is an 82 year old male who came to [facility] for a R THR (posterior).    PT Comments    Pt did very well with PT session this afternoon walking 200 ft (and showing greatly improved safety and execution with ambulation/cadence).  He is able to rise to standing, control sitting and get his legs up into bed all with only minimal cuing and no direct assists.  He recalls 2/3 of hip precautions and thoug he still ahs some limitation and hesitation he generally does well and has shown consistent improvement.  Follow Up Recommendations  Home health PT     Equipment Recommendations       Recommendations for Other Services       Precautions / Restrictions Precautions Precautions: Posterior Hip;Fall Restrictions RLE Weight Bearing: Weight bearing as tolerated    Mobility  Bed Mobility Overal bed mobility: Modified Independent Bed Mobility: Sit to Supine       Sit to supine: Supervision   General bed mobility comments: Pt is able to get legs up into bed w/o assist and shows great effort during the transition.   Transfers Overall transfer level: Modified independent Equipment used: Rolling walker (2 wheeled) Transfers: Sit to/from Stand Sit to Stand: Min guard         General transfer comment: Pt continues to need reminders for hand placement, but was able to lift himself to standing w/o direct assist  Ambulation/Gait Ambulation/Gait assistance: Min guard Ambulation Distance (Feet): 200 Feet Assistive device: Rolling walker (2 wheeled)       General Gait Details: Pt continues to make good improvements with ambulation and is able to circumambulate the nurses' station.  He is able to self correct posture more regularly and never gets the walker too far out in front of him (though he still does need regular cuing.  Pt  walks with good speed and confidence and though he is somewhat fatigued afterward his O2 was in the mid 90s and HR low 80s after the effort. Pt had no LOBs and generally shows increased confidence.    Stairs            Wheelchair Mobility    Modified Rankin (Stroke Patients Only)       Balance                                    Cognition Arousal/Alertness: Awake/alert Behavior During Therapy: WFL for tasks assessed/performed Overall Cognitive Status: Within Functional Limits for tasks assessed                      Exercises Total Joint Exercises Ankle Circles/Pumps: AROM;10 reps Quad Sets: Strengthening;10 reps Gluteal Sets: Strengthening;10 reps Short Arc Quad: 15 reps;Strengthening Heel Slides: 10 reps;AROM;Strengthening Hip ABduction/ADduction: AROM;10 reps Straight Leg Raises: AROM;10 reps    General Comments        Pertinent Vitals/Pain Pain Assessment: No/denies pain (has some minimal pain with exercises)    Home Living                      Prior Function            PT Goals (current goals can now be found in the care plan section) Progress towards PT goals:  Progressing toward goals    Frequency  BID    PT Plan Discharge plan needs to be updated    Co-evaluation             End of Session Equipment Utilized During Treatment: Gait belt Activity Tolerance: Patient limited by fatigue Patient left: with call bell/phone within reach;with family/visitor present;with bed alarm set     Time: [id number] PT Time Calculation (min) (ACUTE ONLY): 31 min  Charges:  $Gait Training: 8-22 mins $Therapeutic Exercise: 8-22 mins                    G Codes:     Wayne Both, PT, DPT [phone]  Kreg Shropshire 08/15/2015, 5:36 PM

## 2015-08-15 NOTE — Progress Notes (Signed)
Foley removed this am, pt yet to void. Pt bladder scanned with 540 ml.  MD Tamala Julian notified. Per MD in and out X1, and reassess in 6 hrs. Will continue to monitor.

## 2015-08-15 NOTE — Progress Notes (Signed)
Occupational Therapy Treatment Patient Details Name: Christopher Jenkins MRN: [id number] DOB: June 18, 1927 Today's Date: 08/15/2015    History of present illness This patient is an 82 year old male who came to [facility] for a R THR (posterior).   OT comments  Patient was sitting up in recliner with family present when OT arrived. Reviewed posterior hip precautions with patient able to recall 1/3 (no crossing legs). Unable to recall other 2 even with cues. Wife aware of all precautions. Educated patient on all 3 precautions again, but when asked to recall at 2 later attempts, still only able to recall 1/3 (same one on no crossing legs). On 3rd review, patient was able to recall a 2nd precaution with cues. Able to recall use of AE, with Min cuing for using reacher, and review of positioning/use of sock aide to set up with sock. Reviewed with wife again on order for donn/doffing pants, she verbalized understanding and states she will continue to review precautions and use of AE with patient. Wife also states they have sock aide, reacher, and long handle shoe horn at home.   Follow Up Recommendations  SNF    Equipment Recommendations       Recommendations for Other Services      Precautions / Restrictions Precautions Precautions: Posterior Hip;Fall Restrictions Weight Bearing Restrictions: Yes RLE Weight Bearing: Weight bearing as tolerated       Mobility Bed Mobility Overal bed mobility: Needs Assistance Bed Mobility: Sit to Supine     Supine to sit: Min assist;Min guard     General bed mobility comments: Pt did well getting to sitting at R side of bed. He needed heavy UE use on rails but very little assist with LEs.   Transfers Overall transfer level: Modified independent Equipment used: Rolling walker (2 wheeled) Transfers: Sit to/from Stand Sit to Stand: Min guard         General transfer comment: Pt better able to stand upright with walker and needing less cuing.  He is able to  maintain balance and shows improved confidence with the effort today.    Balance                                   ADL                                         General ADL Comments: Had been independent, played golf until recently, started using a walking stick.      Vision                     Perception     Praxis      Cognition   Behavior During Therapy: WFL for tasks assessed/performed Overall Cognitive Status: Within Functional Limits for tasks assessed       Memory: Decreased recall of precautions               Extremity/Trunk Assessment               Exercises Total Joint Exercises Ankle Circles/Pumps: AROM;10 reps Quad Sets: Strengthening;10 reps Gluteal Sets: Strengthening;10 reps Short Arc Quad: 15 reps;Strengthening Heel Slides: 10 reps;AROM;Strengthening Hip ABduction/ADduction: AROM;10 reps Straight Leg Raises: AROM;10 reps   Shoulder Instructions       General Comments  Pertinent Vitals/ Pain       Pain Assessment: No/denies pain Pain Score: 6   Home Living                                          Prior Functioning/Environment              Frequency Min 1X/week     Progress Toward Goals  OT Goals(current goals can now be found in the care plan section)  Progress towards OT goals: Progressing toward goals  Acute Rehab OT Goals Patient Stated Goal: Would rather go home. OT Goal Formulation: With patient/family Time For Goal Achievement: 08/28/15 Potential to Achieve Goals: Good  Plan Discharge plan remains appropriate    Co-evaluation                 End of Session     Activity Tolerance Patient tolerated treatment well;No increased pain   Patient Left in chair;with call bell/phone within reach;with chair alarm set;with family/visitor present   Nurse Communication          Time: 1145-1200 OT Time Calculation (min): 15 min  Charges: OT  General Charges $OT Visit: 1 Procedure OT Treatments $Self Care/Home Management : 8-22 mins  Nalee Lightle L 08/15/2015, 12:12 PM

## 2015-08-16 LAB — CBC
HEMATOCRIT: 32 % — AB (ref 40.0–52.0)
HEMOGLOBIN: 11.2 g/dL — AB (ref 13.0–18.0)
MCH: 32.5 pg (ref 26.0–34.0)
MCHC: 35.1 g/dL (ref 32.0–36.0)
MCV: 92.5 fL (ref 80.0–100.0)
Platelets: 164 10*3/uL (ref 150–440)
RBC: 3.46 MIL/uL — ABNORMAL LOW (ref 4.40–5.90)
RDW: 12 % (ref 11.5–14.5)
WBC: 12.9 10*3/uL — ABNORMAL HIGH (ref 3.8–10.6)

## 2015-08-16 NOTE — Progress Notes (Addendum)
Patient is from [facility] (Independent Living). Physical Therapy recommends Home health PT. CSW spoke with patient at bedside. Patient has agreed to [facility] services. Lyn RNCM has been notified of [place] order. No further identified needs.    Anitra Lauth, BSW, MSW, [place] Work Dept [phone]

## 2015-08-16 NOTE — Progress Notes (Signed)
Physical Therapy Treatment Patient Details Name: Christopher Jenkins MRN: [id number] DOB: 1926/10/23 Today's Date: 08/16/2015    History of Present Illness This patient is an 82 year old male who came to [facility] for a R THR (posterior).    PT Comments    Pt continues to show good effort and steady improvement.  He is able to efficiently walk around the nurses' station and shows relatively good confidence.  Pt does well with exercises and does not need physical assist with bed mobility and transfers.  Follow Up Recommendations  Home health PT     Equipment Recommendations       Recommendations for Other Services       Precautions / Restrictions Precautions Precautions: Posterior Hip;Fall Restrictions Weight Bearing Restrictions: Yes RLE Weight Bearing: Partial weight bearing    Mobility  Bed Mobility Overal bed mobility: Modified Independent Bed Mobility: Sit to Supine     Supine to sit: Min guard     General bed mobility comments: Pt able to get to sitting w/o use of bed rails.  Transfers Overall transfer level: Modified independent Equipment used: Rolling walker (2 wheeled) Transfers: Sit to/from Stand Sit to Stand: Min guard         General transfer comment: Pt able to rise w/o assist and control descent with only minimal cuing and no direct assist.  Ambulation/Gait Ambulation/Gait assistance: Min guard Ambulation Distance (Feet): 200 Feet Assistive device: Rolling walker (2 wheeled)       General Gait Details: Pt continues to improve with posture/keeping walker close and does not have any LOBs and only minimal c/o fatigue with the effort. Pt doing well and showing increased confidence and cadence each session.    Stairs            Wheelchair Mobility    Modified Rankin (Stroke Patients Only)       Balance                                    Cognition Arousal/Alertness: Awake/alert Behavior During Therapy: WFL for tasks  assessed/performed Overall Cognitive Status: Within Functional Limits for tasks assessed                      Exercises Total Joint Exercises Ankle Circles/Pumps: AROM;10 reps Quad Sets: Strengthening;10 reps Gluteal Sets: Strengthening;10 reps Short Arc Quad: 15 reps;Strengthening Heel Slides: 10 reps;AROM;Strengthening Hip ABduction/ADduction: AROM;10 reps Straight Leg Raises: AROM;10 reps    General Comments        Pertinent Vitals/Pain Pain Assessment: No/denies pain    Home Living                      Prior Function            PT Goals (current goals can now be found in the care plan section) Progress towards PT goals: Progressing toward goals    Frequency  BID    PT Plan Discharge plan needs to be updated    Co-evaluation             End of Session Equipment Utilized During Treatment: Gait belt Activity Tolerance: Patient limited by fatigue Patient left: with call bell/phone within reach;with family/visitor present;with bed alarm set     Time: [id number] PT Time Calculation (min) (ACUTE ONLY): 24 min  Charges:  $Gait Training: 8-22 mins $Therapeutic Exercise: 8-22 mins  G Codes:     Wayne Both, PT, DPT [phone]  Kreg Shropshire 08/16/2015, 9:45 AM

## 2015-08-16 NOTE — Progress Notes (Signed)
DISCHARGE NOTE:  Pt and wife given discharge instructions and prescriptions. Pt and wife verbalized understanding. Pt wheeled to car by staff.

## 2015-08-16 NOTE — Care Management Note (Signed)
Case Management Note  Patient Details  Name: Christopher Jenkins MRN: [id number] Date of Birth: [birth year]  Subjective/Objective:        Already set up with [facility] for home health PT. Per [facility]-PT, Mr Worku will return to [place].             Action/Plan:   Expected Discharge Date:  08/16/15               Expected Discharge Plan:     In-House Referral:     Discharge planning Services     Post Acute Care Choice:    Choice offered to:     DME Arranged:    DME Agency:     HH Arranged:    HH Agency:     Status of Service:     Medicare Important Message Given:  Yes Date Medicare IM Given:    Medicare IM give by:    Date Additional Medicare IM Given:    Additional Medicare Important Message give by:     If discussed at [place] of Stay Meetings, dates discussed:    Additional Comments:  Ivy Meriwether A, RN 08/16/2015, 8:54 AM

## 2015-08-17 DIAGNOSIS — Z96641 Presence of right artificial hip joint: Secondary | ICD-10-CM | POA: Diagnosis not present

## 2015-08-17 DIAGNOSIS — F0390 Unspecified dementia without behavioral disturbance: Secondary | ICD-10-CM | POA: Diagnosis not present

## 2015-08-17 DIAGNOSIS — I10 Essential (primary) hypertension: Secondary | ICD-10-CM | POA: Diagnosis not present

## 2015-08-17 DIAGNOSIS — Z471 Aftercare following joint replacement surgery: Secondary | ICD-10-CM | POA: Diagnosis not present

## 2015-08-17 LAB — SURGICAL PATHOLOGY

## 2015-08-18 ENCOUNTER — Ambulatory Visit: Payer: Medicare Other

## 2015-08-18 DIAGNOSIS — I10 Essential (primary) hypertension: Secondary | ICD-10-CM | POA: Diagnosis not present

## 2015-08-18 DIAGNOSIS — Z96641 Presence of right artificial hip joint: Secondary | ICD-10-CM | POA: Diagnosis not present

## 2015-08-18 DIAGNOSIS — F0390 Unspecified dementia without behavioral disturbance: Secondary | ICD-10-CM | POA: Diagnosis not present

## 2015-08-18 DIAGNOSIS — Z471 Aftercare following joint replacement surgery: Secondary | ICD-10-CM | POA: Diagnosis not present

## 2015-08-20 DIAGNOSIS — I10 Essential (primary) hypertension: Secondary | ICD-10-CM | POA: Diagnosis not present

## 2015-08-20 DIAGNOSIS — F0390 Unspecified dementia without behavioral disturbance: Secondary | ICD-10-CM | POA: Diagnosis not present

## 2015-08-20 DIAGNOSIS — Z96641 Presence of right artificial hip joint: Secondary | ICD-10-CM | POA: Diagnosis not present

## 2015-08-20 DIAGNOSIS — Z471 Aftercare following joint replacement surgery: Secondary | ICD-10-CM | POA: Diagnosis not present

## 2015-08-24 DIAGNOSIS — I10 Essential (primary) hypertension: Secondary | ICD-10-CM | POA: Diagnosis not present

## 2015-08-24 DIAGNOSIS — Z471 Aftercare following joint replacement surgery: Secondary | ICD-10-CM | POA: Diagnosis not present

## 2015-08-24 DIAGNOSIS — F0390 Unspecified dementia without behavioral disturbance: Secondary | ICD-10-CM | POA: Diagnosis not present

## 2015-08-24 DIAGNOSIS — Z96641 Presence of right artificial hip joint: Secondary | ICD-10-CM | POA: Diagnosis not present

## 2015-08-26 DIAGNOSIS — Z471 Aftercare following joint replacement surgery: Secondary | ICD-10-CM | POA: Diagnosis not present

## 2015-08-26 DIAGNOSIS — I10 Essential (primary) hypertension: Secondary | ICD-10-CM | POA: Diagnosis not present

## 2015-08-26 DIAGNOSIS — M1611 Unilateral primary osteoarthritis, right hip: Secondary | ICD-10-CM | POA: Diagnosis not present

## 2015-08-26 DIAGNOSIS — Z96649 Presence of unspecified artificial hip joint: Secondary | ICD-10-CM | POA: Diagnosis not present

## 2015-08-26 DIAGNOSIS — Z96641 Presence of right artificial hip joint: Secondary | ICD-10-CM | POA: Diagnosis not present

## 2015-08-26 DIAGNOSIS — F0390 Unspecified dementia without behavioral disturbance: Secondary | ICD-10-CM | POA: Diagnosis not present

## 2015-08-28 DIAGNOSIS — I10 Essential (primary) hypertension: Secondary | ICD-10-CM | POA: Diagnosis not present

## 2015-08-28 DIAGNOSIS — Z96641 Presence of right artificial hip joint: Secondary | ICD-10-CM | POA: Diagnosis not present

## 2015-08-28 DIAGNOSIS — F0390 Unspecified dementia without behavioral disturbance: Secondary | ICD-10-CM | POA: Diagnosis not present

## 2015-08-28 DIAGNOSIS — Z471 Aftercare following joint replacement surgery: Secondary | ICD-10-CM | POA: Diagnosis not present

## 2015-08-31 ENCOUNTER — Other Ambulatory Visit: Payer: Self-pay | Admitting: Internal Medicine

## 2015-08-31 DIAGNOSIS — M25551 Pain in right hip: Secondary | ICD-10-CM | POA: Diagnosis not present

## 2015-08-31 DIAGNOSIS — M25651 Stiffness of right hip, not elsewhere classified: Secondary | ICD-10-CM | POA: Diagnosis not present

## 2015-08-31 DIAGNOSIS — R2689 Other abnormalities of gait and mobility: Secondary | ICD-10-CM | POA: Diagnosis not present

## 2015-09-02 ENCOUNTER — Ambulatory Visit (INDEPENDENT_AMBULATORY_CARE_PROVIDER_SITE_OTHER): Payer: Medicare Other

## 2015-09-02 ENCOUNTER — Ambulatory Visit: Payer: Medicare Other | Admitting: Internal Medicine

## 2015-09-02 ENCOUNTER — Telehealth: Payer: Self-pay

## 2015-09-02 VITALS — BP 132/62 | HR 60 | Temp 98.1°F | Resp 14 | Ht 67.5 in | Wt 206.8 lb

## 2015-09-02 DIAGNOSIS — Z0000 Encounter for general adult medical examination without abnormal findings: Secondary | ICD-10-CM

## 2015-09-02 DIAGNOSIS — R2689 Other abnormalities of gait and mobility: Secondary | ICD-10-CM | POA: Diagnosis not present

## 2015-09-02 DIAGNOSIS — M25551 Pain in right hip: Secondary | ICD-10-CM | POA: Diagnosis not present

## 2015-09-02 NOTE — Telephone Encounter (Signed)
Pt called in and stated that he put in a request for his memantine (NAMENDA) 10 MG tablet to be filled. He states that he put that medication in by error, that he still had medication at home  And to disregard refill request.

## 2015-09-02 NOTE — Patient Instructions (Addendum)
  Christopher Jenkins , Thank you for taking time to come for your Medicare Wellness Visit. I appreciate your ongoing commitment to your health goals. Please review the following plan we discussed and let me know if I can assist you in the future.    This is a list of the screening recommended for you and due dates:  Health Maintenance  Topic Date Due  . Flu Shot  01/19/2016  . Tetanus Vaccine  07/30/2019  . Shingles Vaccine  Completed  . Pneumonia vaccines  Completed

## 2015-09-02 NOTE — Progress Notes (Signed)
Subjective:   Christopher Jenkins is a 82 y.o. male who presents for Medicare Annual/Subsequent preventive examination.  Review of Systems:  No ROS.  Medicare Wellness Visit.  Cardiac Risk Factors include: advanced age (>13men, >75 women);male gender;hypertension     Objective:    Vitals: BP 132/62 mmHg  Pulse 60  Temp(Src) 98.1 F (36.7 C) (Oral)  Resp 14  Ht 5' 7.5" (1.715 m)  Wt 206 lb 12.8 oz (93.804 kg)  BMI 31.89 kg/m2  SpO2 97%  Tobacco History  Smoking status  . Never Smoker   Smokeless tobacco  . Never Used     Counseling given: Not Answered   Past Medical History  Diagnosis Date  . Hypertrophy (benign) of prostate     asymptomatic on finasteride. will f/up with urologist [name]  . Hyperlipidemia   . Hypertension   . Tubular adenoma of colon   . Obesity   . Elevated PSA   . BPH (benign prostatic hyperplasia)   . Urinary retention   . Gross hematuria   . Duodenal mass   . Overweight   . Kidney lesion 07/2015    pt has appt at kidney specialist 08/05/15 to review CT scan.   Past Surgical History  Procedure Laterality Date  . Cataracts    . Uvula surgery  1990  . Appendectomy    . Total hip arthroplasty Right 08/13/2015    Procedure: TOTAL HIP ARTHROPLASTY;  Surgeon: [name], MD;  Location: [facility] ORS;  Service: Orthopedics;  Laterality: Right;   Family History  Problem Relation Age of Onset  . Heart disease Mother   . Heart disease Father   . Kidney disease Neg Hx   . Prostate cancer Neg Hx   . Stroke Mother    History  Sexual Activity  . Sexual Activity: No    Outpatient Encounter Prescriptions as of 09/02/2015  Medication Sig  . aspirin EC 325 MG EC tablet Take 1 tablet (325 mg total) by mouth daily.  Marland Kitchen atorvastatin (LIPITOR) 40 MG tablet Take 1 tablet (40 mg total) by mouth daily. (Patient taking differently: Take 40 mg by mouth daily. At 1400)  . BYSTOLIC 2.5 MG tablet TAKE 1 TABLET (2.5 MG TOTAL) BY MOUTH DAILY. (Patient  taking differently: TAKE 1 TABLET (2.5 MG TOTAL) BY MOUTH DAILY. at 1400)  . fish oil-omega-3 fatty acids 1000 MG capsule Take 1 g by mouth daily. At 1400  . memantine (NAMENDA) 10 MG tablet TAKE 1 TABLET (10 MG TOTAL) BY MOUTH 2 (TWO) TIMES DAILY.  . Multiple Vitamins-Minerals (MULTIVITAMIN WITH MINERALS) tablet Take 1 tablet by mouth daily. At 1400  . Calcium Carbonate-Vitamin D (CALCIUM-VITAMIN D) 600-125 MG-UNIT TABS Take 1 tablet by mouth daily at 2 PM. Reported on 09/02/2015  . Cholecalciferol (VITAMIN D3 PO) Take 1 tablet by mouth daily. Reported on 09/02/2015  . clopidogrel (PLAVIX) 75 MG tablet Take 1 tablet (75 mg total) by mouth daily. (Patient not taking: Reported on 09/02/2015)  . docusate (COLACE) 50 MG/5ML liquid 3 drops in each ear daily to prevent occlusion with wax (Patient taking differently: 3 drops in each ear daily to prevent occlusion with wax as needed.)  . finasteride (PROSCAR) 5 MG tablet TAKE 1 TABLET BY MOUTH EVERY DAY (Patient taking differently: TAKE 1 TABLET BY MOUTH EVERY DAY at 1400)  . hydrochlorothiazide (HYDRODIURIL) 25 MG tablet Take 1 tablet (25 mg total) by mouth daily. (Patient not taking: Reported on 09/02/2015)  . niacin 250 MG tablet  Take 250 mg by mouth daily. At 1400  . [DISCONTINUED] oxyCODONE (OXY IR/ROXICODONE) 5 MG immediate release tablet Take 1-2 tablets (5-10 mg total) by mouth every 4 (four) hours as needed for breakthrough pain ((for MODERATE breakthrough pain)).   No facility-administered encounter medications on file as of 09/02/2015.    Activities of Daily Living In your present state of health, do you have any difficulty performing the following activities: 09/02/2015 08/13/2015  Hearing? N -  Vision? N -  Difficulty concentrating or making decisions? N -  Walking or climbing stairs? Y -  Dressing or bathing? Y -  Doing errands, shopping? Y N  Preparing Food and eating ? N -  Using the Toilet? N -  In the past six months, have you  accidently leaked urine? N -  Do you have problems with loss of bowel control? N -  Managing your Medications? N -  Managing your Finances? N -  Housekeeping or managing your Housekeeping? Y -    Patient Care Team: [name], MD as PCP - General (Internal Medicine)   Assessment:   This is a routine wellness examination for Christopher Jenkins. The goal of the wellness visit is to assist the patient how to close the gaps in care and create a preventative care plan for the patient.   Osteoporosis risk reviewed.  Medications reviewed.  Safety issues reviewed; smoke detectors in the home. No firearms in the home.  Wears seatbelts when driving or riding with others. No violence in the home.  No identified risk were noted; The patient was oriented x 3; appropriate in dress and manner and no objective failures at ADL's or IADL's.    Periph vascular dis NOS-stable and followed by PCP.  Patient Concerns:  He is uncertain if he needs to restart medications held since surgery; PLAVIX, VIT D, HydroCHLOROthiazide.  Also, dosing off daily;  reports this as new and is worried about it.  Follow up appointment scheduled for this upcoming Monday.   Exercise Activities and Dietary recommendations Current Exercise Habits: Structured exercise class (Physical Therapy), Time (Minutes): 60, Frequency (Times/Week): 3, Weekly Exercise (Minutes/Week): 180, Intensity: Mild  Goals    . Healthy Lifestyle     Continue with physical therapy exercises.  Increase walking as tolerated. Stay hydrated!  Drink plenty of water. Low carb foods.  Lean meats, fruits and vegetables.      Fall Risk Fall Risk  09/02/2015 07/06/2012 06/06/2012  Falls in the past year? No No No   Depression Screen PHQ 2/9 Scores 09/02/2015 06/06/2012  PHQ - 2 Score 0 0    Cognitive Testing MMSE - Mini Mental State Exam 09/02/2015  Orientation to time 5  Orientation to Place 5  Registration 3  Attention/ Calculation 5  Recall 3    Language- name 2 objects 2  Language- repeat 1  Language- follow 3 step command 3  Language- read & follow direction 1  Write a sentence 1  Copy design 1  Total score 30    Immunization History  Administered Date(s) Administered  . Influenza Split 04/03/2012, 03/12/2013  . Influenza,inj,Quad PF,36+ Mos 02/24/2015  . Influenza-Unspecified 03/29/2014  . Pneumococcal Conjugate-13 05/07/2014  . Pneumococcal Polysaccharide-23 03/26/2013  . Tdap 07/29/2009  . Zoster 04/26/2013   Screening Tests Health Maintenance  Topic Date Due  . INFLUENZA VACCINE  01/19/2016  . TETANUS/TDAP  07/30/2019  . ZOSTAVAX  Completed  . PNA vac Low Risk Adult  Completed      Plan:  End of life planning; Advance aging; Advanced directives discussed. Copy of current HCPOA/Living Will requested.  During the course of the visit the patient was educated and counseled about the following appropriate screening and preventive services:   Vaccines to include Pneumoccal, Influenza, Hepatitis B, Td, Zostavax, HCV  Electrocardiogram  Cardiovascular Disease  Colorectal cancer screening  Diabetes screening  Prostate Cancer Screening  Glaucoma screening  Nutrition counseling   Smoking cessation counseling  Patient Instructions (the written plan) was given to the patient.    Varney Biles, LPN  [id number]

## 2015-09-03 ENCOUNTER — Ambulatory Visit: Payer: Medicare Other

## 2015-09-03 NOTE — Progress Notes (Signed)
  I have reviewed the above information and agree with above.   Will address his medication questions at his visit on Monday   Deborra Medina, MD

## 2015-09-07 ENCOUNTER — Ambulatory Visit (INDEPENDENT_AMBULATORY_CARE_PROVIDER_SITE_OTHER): Payer: Medicare Other | Admitting: Internal Medicine

## 2015-09-07 ENCOUNTER — Encounter: Payer: Self-pay | Admitting: Internal Medicine

## 2015-09-07 VITALS — BP 104/60 | HR 61 | Temp 98.0°F | Resp 12 | Ht 68.0 in | Wt 206.0 lb

## 2015-09-07 DIAGNOSIS — G4733 Obstructive sleep apnea (adult) (pediatric): Secondary | ICD-10-CM

## 2015-09-07 DIAGNOSIS — E871 Hypo-osmolality and hyponatremia: Secondary | ICD-10-CM

## 2015-09-07 DIAGNOSIS — D509 Iron deficiency anemia, unspecified: Secondary | ICD-10-CM

## 2015-09-07 DIAGNOSIS — K760 Fatty (change of) liver, not elsewhere classified: Secondary | ICD-10-CM

## 2015-09-07 DIAGNOSIS — N2889 Other specified disorders of kidney and ureter: Secondary | ICD-10-CM

## 2015-09-07 DIAGNOSIS — I10 Essential (primary) hypertension: Secondary | ICD-10-CM

## 2015-09-07 DIAGNOSIS — Z96641 Presence of right artificial hip joint: Secondary | ICD-10-CM

## 2015-09-07 DIAGNOSIS — N400 Enlarged prostate without lower urinary tract symptoms: Secondary | ICD-10-CM

## 2015-09-07 DIAGNOSIS — Z7289 Other problems related to lifestyle: Secondary | ICD-10-CM

## 2015-09-07 DIAGNOSIS — E785 Hyperlipidemia, unspecified: Secondary | ICD-10-CM | POA: Diagnosis not present

## 2015-09-07 DIAGNOSIS — D649 Anemia, unspecified: Secondary | ICD-10-CM | POA: Insufficient documentation

## 2015-09-07 DIAGNOSIS — N138 Other obstructive and reflux uropathy: Secondary | ICD-10-CM

## 2015-09-07 DIAGNOSIS — N401 Enlarged prostate with lower urinary tract symptoms: Secondary | ICD-10-CM

## 2015-09-07 DIAGNOSIS — E669 Obesity, unspecified: Secondary | ICD-10-CM

## 2015-09-07 DIAGNOSIS — E559 Vitamin D deficiency, unspecified: Secondary | ICD-10-CM

## 2015-09-07 MED ORDER — MEMANTINE HCL 10 MG PO TABS: ORAL_TABLET | ORAL | Status: DC

## 2015-09-07 MED ORDER — NEBIVOLOL HCL 2.5 MG PO TABS: ORAL_TABLET | ORAL | Status: DC

## 2015-09-07 NOTE — Patient Instructions (Addendum)
Your sleepiness may be from too many nighttime bathroom breaks.  Nothing we can do about that  Try going for a lower carb breakfast, because maybe the sugar load is contrubting   ,  Try the Premier Protein sheakes   160 cal   1 g sugar and 30 g protein   Danton Clap now makes a frozen breakfast frittata that can be microwaved in 2 minutes and is very low carb. Frittatas are similar to quiches without the crust.   Return for fasting labs including an iron check

## 2015-09-07 NOTE — Progress Notes (Signed)
Subjective:  Patient ID: Christopher Jenkins, male    DOB: 10/21/1926  Age: 82 y.o. MRN: [id number]  CC: The primary encounter diagnosis was Hyperlipidemia. Diagnoses of Essential hypertension, OSA (obstructive sleep apnea), Hyponatremia, Iron deficiency anemia, Vitamin D deficiency, Other problems related to lifestyle, Hypertrophy (benign) of prostate, BPH with obstruction/lower urinary tract symptoms, H/O total hip arthroplasty, right, Obesity (BMI 30-39.9), Renal mass, and Hepatic steatosis were also pertinent to this visit.  HPI EMIEL KIELTY presents for follow up on hypertension, lipids dementia, and recent hip surgery   Hip surgery in Feb by KK  Total hip .  Success.   finished inpatient PT ,  now going to [facility] outpatient PT 3/week . Pain free,   Plans to play doubles tennis this summer.   Wife concerned about daytime sleepiness.  He has OSA and  is wearing CPAP about 7 hours per night, but has a HUMUNGOUS prostate and despite management by Urology had nocturia x 3.  Confused about medications.  Has mutliple bottles of Bystolic,  Pharmacy sending  Out  3 #30 day bottles at a time. Most other meds are refilled at #90     Outpatient Prescriptions Prior to Visit  Medication Sig Dispense Refill  . aspirin EC 325 MG EC tablet Take 1 tablet (325 mg total) by mouth daily. (Patient taking differently: Take 81 mg by mouth daily. ) 30 tablet 0  . atorvastatin (LIPITOR) 40 MG tablet Take 1 tablet (40 mg total) by mouth daily. (Patient taking differently: Take 40 mg by mouth daily. At 1400) 90 tablet 3  . Calcium Carbonate-Vitamin D (CALCIUM-VITAMIN D) 600-125 MG-UNIT TABS Take 1 tablet by mouth daily at 2 PM. Reported on 09/02/2015    . Cholecalciferol (VITAMIN D3 PO) Take 1 tablet by mouth daily. Reported on 09/02/2015    . clopidogrel (PLAVIX) 75 MG tablet Take 1 tablet (75 mg total) by mouth daily. 90 tablet 3  . docusate (COLACE) 50 MG/5ML liquid 3 drops in each ear daily to prevent occlusion  with wax (Patient taking differently: 3 drops in each ear daily to prevent occlusion with wax as needed.) 100 mL 0  . finasteride (PROSCAR) 5 MG tablet TAKE 1 TABLET BY MOUTH EVERY DAY (Patient taking differently: TAKE 1 TABLET BY MOUTH EVERY DAY at 1400) 90 tablet 3  . fish oil-omega-3 fatty acids 1000 MG capsule Take 1 g by mouth daily. Reported on 09/07/2015    . hydrochlorothiazide (HYDRODIURIL) 25 MG tablet Take 1 tablet (25 mg total) by mouth daily. 90 tablet 3  . BYSTOLIC 2.5 MG tablet TAKE 1 TABLET (2.5 MG TOTAL) BY MOUTH DAILY. (Patient taking differently: TAKE 1 TABLET (2.5 MG TOTAL) BY MOUTH DAILY. at 1400) 30 tablet 3  . memantine (NAMENDA) 10 MG tablet TAKE 1 TABLET (10 MG TOTAL) BY MOUTH 2 (TWO) TIMES DAILY. 60 tablet 2  . Multiple Vitamins-Minerals (MULTIVITAMIN WITH MINERALS) tablet Take 1 tablet by mouth daily. At 1400    . niacin 250 MG tablet Take 250 mg by mouth daily. At 1400     No facility-administered medications prior to visit.    Review of Systems;  Patient denies headache, fevers, malaise, unintentional weight loss, skin rash, eye pain, sinus congestion and sinus pain, sore throat, dysphagia,  hemoptysis , cough, dyspnea, wheezing, chest pain, palpitations, orthopnea, edema, abdominal pain, nausea, melena, diarrhea, constipation, flank pain, dysuria, hematuria,, numbness, tingling, seizures,  Focal weakness, Loss of consciousness,  Tremor, insomnia, depression, anxiety, and suicidal ideation.  Objective:  BP 104/60 mmHg  Pulse 61  Temp(Src) 98 F (36.7 C) (Oral)  Resp 12  Ht 5' 8"  (1.727 m)  Wt 206 lb (93.441 kg)  BMI 31.33 kg/m2  SpO2 97%  BP Readings from Last 3 Encounters:  09/07/15 104/60  09/02/15 132/62  08/16/15 124/55    Wt Readings from Last 3 Encounters:  09/07/15 206 lb (93.441 kg)  09/02/15 206 lb 12.8 oz (93.804 kg)  08/13/15 202 lb (91.627 kg)    General appearance: alert, cooperative and appears stated age Ears: normal TM's and  external ear canals both ears Throat: lips, mucosa, and tongue normal; teeth and gums normal Neck: no adenopathy, no carotid bruit, supple, symmetrical, trachea midline and thyroid not enlarged, symmetric, no tenderness/mass/nodules Back: symmetric, no curvature. ROM normal. No CVA tenderness. Lungs: clear to auscultation bilaterally Heart: regular rate and rhythm, S1, S2 normal, no murmur, click, rub or gallop Abdomen: soft, non-tender; bowel sounds normal; no masses,  no organomegaly Pulses: 2+ and symmetric Skin: Skin color, texture, turgor normal. No rashes or lesions Lymph nodes: Cervical, supraclavicular, and axillary nodes normal.  No results found for: HGBA1C  Lab Results  Component Value Date   CREATININE 0.98 08/15/2015   CREATININE 0.96 08/14/2015   CREATININE 0.81 07/29/2015    Lab Results  Component Value Date   WBC 12.9* 08/16/2015   HGB 11.2* 08/16/2015   HCT 32.0* 08/16/2015   PLT 164 08/16/2015   GLUCOSE 119* 08/15/2015   CHOL 169 02/25/2015   TRIG 68.0 02/25/2015   HDL 51.30 02/25/2015   LDLDIRECT 125.1 01/06/2014   LDLCALC 104* 02/25/2015   ALT 20 07/29/2015   AST 29 07/29/2015   NA 137 08/15/2015   K 3.8 08/15/2015   CL 100* 08/15/2015   CREATININE 0.98 08/15/2015   BUN 16 08/15/2015   CO2 33* 08/15/2015   TSH 1.74 02/25/2015   INR 1.13 07/29/2015    Ct Abdomen Pelvis W Wo Contrast  08/05/2015  CLINICAL DATA:  Evaluate for renal mass. EXAM: CT ABDOMEN AND PELVIS WITHOUT AND WITH CONTRAST TECHNIQUE: Multidetector CT imaging of the abdomen and pelvis was performed following the standard protocol before and following the bolus administration of intravenous contrast. CONTRAST:  143m OMNIPAQUE IOHEXOL 350 MG/ML SOLN COMPARISON:  05/08/2015 FINDINGS: Lower chest: No pleural effusion identified. The lung bases are clear. Hepatobiliary: Hepatic steatosis noted. Subcapsular lesion within the medial segment of left lobe of liver is unchanged measuring 1.5 cm.  Also stable is a low density structure within the per free of the right lobe of liver measuring 8 mm. Stable lateral segment of left lobe of liver hypodensity measuring 6 mm. The gallbladder appears within normal limits. There is no biliary dilatation. Pancreas: Normal Spleen: Normal appearance of the spleen. Adrenals/Urinary Tract: The adrenal glands are both unremarkable. Bilateral nephrolithiasis noted. Within the inferior pole of the right kidney there is a 3 mm nonobstructing calculus. Within the mid left kidney there is a stone which measures 3 mm. But says it Bilateral renal cysts are noted. The largest left renal cyst measures 6.8 cm. The largest right renal cyst measures 7.7 cm. This contains a thin, hairline nonenhancing septation. No mural nodularity or septation identified. The previously described Bosniak category 44F lesion arising from the inferior pole of the left kidney is again noted and is unchanged measuring 11 mm, image 76 of series 5. On the delayed images there is symmetric excretion of contrast material by both kidneys. The urinary bladder appears within  normal limits. Stomach/Bowel: The stomach is within normal limits. Diverticula arising from the heart zonal portion of the duodenum noted. The small bowel loops have a normal course and caliber. No obstruction. Normal appearance of the colon. Vascular/Lymphatic: Calcified atherosclerotic disease involves the abdominal aorta. No aneurysm. No enlarged retroperitoneal or mesenteric adenopathy. No enlarged pelvic or inguinal lymph nodes. Reproductive: Marked enlargement of the prostate gland is identified. Other: No free fluid or fluid collections within the abdomen or pelvis. Musculoskeletal: Degenerative disc disease is present within the lumbar spine. There is an anterolisthesis of L4 on L5. IMPRESSION: 1. Stable bilateral renal cysts of varying complexity ease including Bosniak categories 1, 2 and 15F. A 12 month followup examination of Bosniak  category 15F lesions is recommended. 2. Nonobstructing renal calculi. 3. Aortic atherosclerosis. 4. Stable low-attenuation foci within the liver. 5. Hepatic steatosis. Electronically Signed   By: Kerby Moors M.D.   On: 08/05/2015 14:54    Assessment & Plan:   Problem List Items Addressed This Visit    RESOLVED: Hypertrophy (benign) of prostate   Hyperlipidemia - Primary    Continued use of statin is advised given atherosclerosis of aorta noted on prior CT   Lab Results  Component Value Date   CHOL 169 02/25/2015   HDL 51.30 02/25/2015   LDLCALC 104* 02/25/2015   LDLDIRECT 125.1 01/06/2014   TRIG 68.0 02/25/2015   CHOLHDL 3 02/25/2015   Lab Results  Component Value Date   ALT 20 07/29/2015   AST 29 07/29/2015   ALKPHOS 163* 07/29/2015   BILITOT 1.0 07/29/2015         Relevant Medications   nebivolol (BYSTOLIC) 2.5 MG tablet   Other Relevant Orders   Lipid panel   Obesity (BMI 30-39.9)    Minimal weight gain post hip replacement, I have addressed  BMI and recommended wt loss of 10% of body weigh over the next 6 months using a low glycemic index diet and regular exercise a minimum of 5 days per week.        OSA (obstructive sleep apnea)    Diagnosed by prior sleep study. Patient is using CPAP every night a minimum of 6 hours per night and notes improved daytime wakefulness and decreased fatigue       BPH with obstruction/lower urinary tract symptoms    Symptoms not controlled with proscar.  Advised to discuss additional treatment with Urology given nocturia x 3   I      Renal mass    frepeat CT in Feb 2017 shows stable bilateral renal cysts . 12 month follow up is advised.       H/O total hip arthroplasty    Posterior approach to right hip by Barrie Lyme was a success,  Suture scar examined and is well healed,  Patient is pain free and continuing outpatient PT       Anemia    Mild,  Iron studies and repeat hgb pending post surgery.   CBC Latest Ref Rng  08/16/2015 08/15/2015 08/14/2015  WBC 3.8 - 10.6 K/uL 12.9(H) 12.1(H) 12.2(H)  Hemoglobin 13.0 - 18.0 g/dL 11.2(L) 11.6(L) 12.5(L)  Hematocrit 40.0 - 52.0 % 32.0(L) 33.5(L) 36.1(L)  Platelets 150 - 440 K/uL 164 153 167         Relevant Orders   CBC with Differential/Platelet   Iron and TIBC   Ferritin   Hepatic steatosis    Incidental finding on CT,  Normal transaminases, mild elevation of alk phos noted with normal GB,  No ductal dilitation on CT.  Continue to work on achieving weight loss  ,  And continue statin .       Hypertension   Relevant Medications   nebivolol (BYSTOLIC) 2.5 MG tablet   Other Relevant Orders   Comprehensive metabolic panel   Hyponatremia   Relevant Orders   TSH    Other Visit Diagnoses    Vitamin D deficiency        Relevant Orders    VITAMIN D 25 Hydroxy (Vit-D Deficiency, Fractures)    Other problems related to lifestyle        Relevant Orders    HIV antibody       I have changed Mr. Formby's BYSTOLIC to nebivolol. I am also having him maintain his niacin, fish oil-omega-3 fatty acids, Cholecalciferol (VITAMIN D3 PO), multivitamin with minerals, Calcium-Vitamin D, docusate, atorvastatin, clopidogrel, finasteride, hydrochlorothiazide, aspirin, and memantine.  Meds ordered this encounter  Medications  . nebivolol (BYSTOLIC) 2.5 MG tablet    Sig: TAKE 1 TABLET (2.5 MG TOTAL) BY MOUTH DAILY. at 1400    Dispense:  90 tablet    Refill:  1    KEEP ON FILE FOR FUTURE REFILLS  . memantine (NAMENDA) 10 MG tablet    Sig: TAKE 1 TABLET (10 MG TOTAL) BY MOUTH 2 (TWO) TIMES DAILY.    Dispense:  180 tablet    Refill:  1    Keep on file for future refills    Medications Discontinued During This Encounter  Medication Reason  . BYSTOLIC 2.5 MG tablet Reorder  . memantine (NAMENDA) 10 MG tablet Reorder    Follow-up: Return in about 6 months (around 03/09/2016) for fasitng labs soon .   [name], MD

## 2015-09-07 NOTE — Progress Notes (Signed)
Pre-visit discussion using our clinic review tool. No additional management support is needed unless otherwise documented below in the visit note.  

## 2015-09-08 DIAGNOSIS — K760 Fatty (change of) liver, not elsewhere classified: Secondary | ICD-10-CM | POA: Insufficient documentation

## 2015-09-08 NOTE — Assessment & Plan Note (Signed)
frepeat CT in Feb 2017 shows stable bilateral renal cysts . 12 month follow up is advised.

## 2015-09-08 NOTE — Assessment & Plan Note (Signed)
Mild,  Iron studies and repeat hgb pending post surgery.   CBC Latest Ref Rng 08/16/2015 08/15/2015 08/14/2015  WBC 3.8 - 10.6 K/uL 12.9(H) 12.1(H) 12.2(H)  Hemoglobin 13.0 - 18.0 g/dL 11.2(L) 11.6(L) 12.5(L)  Hematocrit 40.0 - 52.0 % 32.0(L) 33.5(L) 36.1(L)  Platelets 150 - 440 K/uL 164 153 167

## 2015-09-08 NOTE — Assessment & Plan Note (Signed)
Diagnosed by prior sleep study. Patient is using CPAP every night a minimum of 6 hours per night and notes improved daytime wakefulness and decreased fatigue  

## 2015-09-08 NOTE — Assessment & Plan Note (Signed)
Symptoms not controlled with proscar.  Advised to discuss additional treatment with Urology given nocturia x 3   I

## 2015-09-08 NOTE — Assessment & Plan Note (Signed)
Minimal weight gain post hip replacement, I have addressed  BMI and recommended wt loss of 10% of body weigh over the next 6 months using a low glycemic index diet and regular exercise a minimum of 5 days per week.

## 2015-09-08 NOTE — Assessment & Plan Note (Addendum)
Posterior approach to right hip by Barrie Lyme was a success,  Suture scar examined and is well healed,  Patient is pain free and continuing outpatient PT

## 2015-09-08 NOTE — Assessment & Plan Note (Signed)
Incidental finding on CT,  Normal transaminases, mild elevation of alk phos noted with normal GB,  No ductal dilitation on CT.  Continue to work on achieving weight loss  ,  And continue statin .

## 2015-09-08 NOTE — Assessment & Plan Note (Signed)
Continued use of statin is advised given atherosclerosis of aorta noted on prior CT   Lab Results  Component Value Date   CHOL 169 02/25/2015   HDL 51.30 02/25/2015   LDLCALC 104* 02/25/2015   LDLDIRECT 125.1 01/06/2014   TRIG 68.0 02/25/2015   CHOLHDL 3 02/25/2015   Lab Results  Component Value Date   ALT 20 07/29/2015   AST 29 07/29/2015   ALKPHOS 163* 07/29/2015   BILITOT 1.0 07/29/2015

## 2015-09-09 DIAGNOSIS — R2689 Other abnormalities of gait and mobility: Secondary | ICD-10-CM | POA: Diagnosis not present

## 2015-09-09 DIAGNOSIS — M25551 Pain in right hip: Secondary | ICD-10-CM | POA: Diagnosis not present

## 2015-09-11 DIAGNOSIS — R2689 Other abnormalities of gait and mobility: Secondary | ICD-10-CM | POA: Diagnosis not present

## 2015-09-11 DIAGNOSIS — M25551 Pain in right hip: Secondary | ICD-10-CM | POA: Diagnosis not present

## 2015-09-14 ENCOUNTER — Other Ambulatory Visit: Payer: Medicare Other

## 2015-09-15 DIAGNOSIS — R2689 Other abnormalities of gait and mobility: Secondary | ICD-10-CM | POA: Diagnosis not present

## 2015-09-15 DIAGNOSIS — M25551 Pain in right hip: Secondary | ICD-10-CM | POA: Diagnosis not present

## 2015-09-18 DIAGNOSIS — R2689 Other abnormalities of gait and mobility: Secondary | ICD-10-CM | POA: Diagnosis not present

## 2015-09-18 DIAGNOSIS — M25551 Pain in right hip: Secondary | ICD-10-CM | POA: Diagnosis not present

## 2015-09-22 DIAGNOSIS — R2689 Other abnormalities of gait and mobility: Secondary | ICD-10-CM | POA: Diagnosis not present

## 2015-09-22 DIAGNOSIS — M25551 Pain in right hip: Secondary | ICD-10-CM | POA: Diagnosis not present

## 2015-09-25 DIAGNOSIS — R2689 Other abnormalities of gait and mobility: Secondary | ICD-10-CM | POA: Diagnosis not present

## 2015-09-25 DIAGNOSIS — M25551 Pain in right hip: Secondary | ICD-10-CM | POA: Diagnosis not present

## 2015-09-28 ENCOUNTER — Telehealth: Payer: Self-pay | Admitting: Internal Medicine

## 2015-09-28 NOTE — Telephone Encounter (Signed)
Called patient and patient DPR notified issues are now resolved.

## 2015-09-28 NOTE — Telephone Encounter (Signed)
Pt lvm asking for nurse to call him back. States that he was recently seen by Dr. Mack Guise at [facility] (he recently had hip surgery with him) and asked him if he should continue taking his meds that is on his list. Dr. Mack Guise stated that he needed to call Dr. Lupita Dawn office so we can screen his meds. Please cb.

## 2015-09-29 DIAGNOSIS — M25551 Pain in right hip: Secondary | ICD-10-CM | POA: Diagnosis not present

## 2015-09-29 DIAGNOSIS — R2689 Other abnormalities of gait and mobility: Secondary | ICD-10-CM | POA: Diagnosis not present

## 2015-10-01 DIAGNOSIS — M6281 Muscle weakness (generalized): Secondary | ICD-10-CM | POA: Diagnosis not present

## 2015-10-03 ENCOUNTER — Other Ambulatory Visit: Payer: Self-pay | Admitting: Internal Medicine

## 2015-10-06 DIAGNOSIS — R2689 Other abnormalities of gait and mobility: Secondary | ICD-10-CM | POA: Diagnosis not present

## 2015-10-06 DIAGNOSIS — M25551 Pain in right hip: Secondary | ICD-10-CM | POA: Diagnosis not present

## 2015-10-09 DIAGNOSIS — M25551 Pain in right hip: Secondary | ICD-10-CM | POA: Diagnosis not present

## 2015-10-09 DIAGNOSIS — R2689 Other abnormalities of gait and mobility: Secondary | ICD-10-CM | POA: Diagnosis not present

## 2015-10-27 ENCOUNTER — Ambulatory Visit: Payer: Medicare Other | Admitting: Podiatry

## 2015-12-01 ENCOUNTER — Telehealth: Payer: Self-pay | Admitting: Internal Medicine

## 2015-12-01 NOTE — Telephone Encounter (Signed)
Ok to refill Mobic last OV was 3/17?

## 2015-12-01 NOTE — Telephone Encounter (Signed)
filled

## 2015-12-15 MED ORDER — NEBIVOLOL HCL 2.5 MG PO TABS: ORAL_TABLET | ORAL | Status: DC

## 2015-12-15 NOTE — Telephone Encounter (Signed)
Pt lvm stating that he needs refill of his bystolic 2.5 mg. He called his pharmacy and they have no more refills. Please cb pt once sent.

## 2015-12-15 NOTE — Telephone Encounter (Signed)
Refilled

## 2015-12-18 ENCOUNTER — Telehealth: Payer: Self-pay | Admitting: *Deleted

## 2015-12-18 NOTE — Telephone Encounter (Signed)
Rx sent.  Confirmed pharmacy receipt.   Patient notified.

## 2015-12-18 NOTE — Telephone Encounter (Signed)
Patient has requested to have a medication refill for bystolic Pharmacy [facility]

## 2016-01-19 DIAGNOSIS — M25551 Pain in right hip: Secondary | ICD-10-CM | POA: Diagnosis not present

## 2016-03-09 ENCOUNTER — Ambulatory Visit (INDEPENDENT_AMBULATORY_CARE_PROVIDER_SITE_OTHER): Payer: Medicare Other | Admitting: Internal Medicine

## 2016-03-09 ENCOUNTER — Encounter: Payer: Self-pay | Admitting: Internal Medicine

## 2016-03-09 VITALS — BP 110/56 | HR 49 | Temp 98.1°F | Ht 68.0 in | Wt 210.0 lb

## 2016-03-09 DIAGNOSIS — E559 Vitamin D deficiency, unspecified: Secondary | ICD-10-CM | POA: Diagnosis not present

## 2016-03-09 DIAGNOSIS — E871 Hypo-osmolality and hyponatremia: Secondary | ICD-10-CM | POA: Diagnosis not present

## 2016-03-09 DIAGNOSIS — E785 Hyperlipidemia, unspecified: Secondary | ICD-10-CM | POA: Diagnosis not present

## 2016-03-09 DIAGNOSIS — K760 Fatty (change of) liver, not elsewhere classified: Secondary | ICD-10-CM | POA: Diagnosis not present

## 2016-03-09 DIAGNOSIS — D509 Iron deficiency anemia, unspecified: Secondary | ICD-10-CM | POA: Diagnosis not present

## 2016-03-09 DIAGNOSIS — I10 Essential (primary) hypertension: Secondary | ICD-10-CM

## 2016-03-09 DIAGNOSIS — Z7289 Other problems related to lifestyle: Secondary | ICD-10-CM

## 2016-03-09 DIAGNOSIS — E669 Obesity, unspecified: Secondary | ICD-10-CM

## 2016-03-09 LAB — CBC WITH DIFFERENTIAL/PLATELET
BASOS ABS: 0 10*3/uL (ref 0.0–0.1)
Basophils Relative: 0.5 % (ref 0.0–3.0)
Eosinophils Absolute: 0.1 10*3/uL (ref 0.0–0.7)
Eosinophils Relative: 1 % (ref 0.0–5.0)
HCT: 41.9 % (ref 39.0–52.0)
Hemoglobin: 14.5 g/dL (ref 13.0–17.0)
LYMPHS ABS: 1.8 10*3/uL (ref 0.7–4.0)
LYMPHS PCT: 26.6 % (ref 12.0–46.0)
MCHC: 34.6 g/dL (ref 30.0–36.0)
MCV: 92.5 fl (ref 78.0–100.0)
MONOS PCT: 10.2 % (ref 3.0–12.0)
Monocytes Absolute: 0.7 10*3/uL (ref 0.1–1.0)
NEUTROS PCT: 61.7 % (ref 43.0–77.0)
Neutro Abs: 4.2 10*3/uL (ref 1.4–7.7)
Platelets: 188 10*3/uL (ref 150.0–400.0)
RBC: 4.53 Mil/uL (ref 4.22–5.81)
RDW: 12.4 % (ref 11.5–15.5)
WBC: 6.7 10*3/uL (ref 4.0–10.5)

## 2016-03-09 LAB — LIPID PANEL
Cholesterol: 106 mg/dL (ref 0–200)
HDL: 41.2 mg/dL (ref >39.00)
LDL Cholesterol: 46 mg/dL (ref 0–99)
NONHDL: 64.98
Total CHOL/HDL Ratio: 3
Triglycerides: 93 mg/dL (ref 0.0–149.0)
VLDL: 18.6 mg/dL (ref 0.0–40.0)

## 2016-03-09 LAB — FERRITIN: Ferritin: 65.3 ng/mL (ref 22.0–322.0)

## 2016-03-09 LAB — COMPREHENSIVE METABOLIC PANEL
ALK PHOS: 108 U/L (ref 39–117)
ALT: 12 U/L (ref 0–53)
AST: 19 U/L (ref 0–37)
Albumin: 3.7 g/dL (ref 3.5–5.2)
BILIRUBIN TOTAL: 0.6 mg/dL (ref 0.2–1.2)
BUN: 25 mg/dL — ABNORMAL HIGH (ref 6–23)
CALCIUM: 8.9 mg/dL (ref 8.4–10.5)
CO2: 32 mEq/L (ref 19–32)
Chloride: 102 mEq/L (ref 96–112)
Creatinine, Ser: 0.94 mg/dL (ref 0.40–1.50)
GFR: 80.73 mL/min (ref >60.00)
Glucose, Bld: 102 mg/dL — ABNORMAL HIGH (ref 70–99)
Potassium: 3.4 mEq/L — ABNORMAL LOW (ref 3.5–5.1)
Sodium: 140 mEq/L (ref 135–145)
TOTAL PROTEIN: 6.4 g/dL (ref 6.0–8.3)

## 2016-03-09 LAB — TSH: TSH: 1.19 u[IU]/mL (ref 0.35–4.50)

## 2016-03-09 LAB — VITAMIN D 25 HYDROXY (VIT D DEFICIENCY, FRACTURES): VITD: 31.28 ng/mL (ref 30.00–100.00)

## 2016-03-09 MED ORDER — ASPIRIN EC 81 MG PO TBEC: 81.0000 mg | DELAYED_RELEASE_TABLET | Freq: Every day | ORAL | 3 refills | Status: DC

## 2016-03-09 NOTE — Patient Instructions (Addendum)
Your blood pressure was very good but your pulse was too slow today.  Please Suspend the Bystolic. Continue taking hctz for blood pressure   Have your blood pressure  And pulse checked in a week and ask the RN at [facility] to send me the reading

## 2016-03-09 NOTE — Progress Notes (Signed)
Subjective:  Patient ID: Christopher Jenkins, male    DOB: 10-27-1926  Age: 82 y.o. MRN: [id number]  CC: The primary encounter diagnosis was Fatty liver. Diagnoses of Vitamin D deficiency, Other problems related to lifestyle, Hyponatremia, Iron deficiency anemia, Hyperlipidemia, Hepatic steatosis, Essential hypertension, and Obesity (BMI 30-39.9) were also pertinent to this visit.  HPI Christopher Jenkins presents for 6 month follow up on dementia, hyperlipidemia, and hypertension  Bradycardia noted today,  patient taking Bystolic 2.5 mg daily due to history of ACE I induced cough  Had a similar reading in April 2015 bc he was not using pill box .  Still not using a pill box  Feeling sluggish?  Yes, Took a 30 minute nap yesterday morning, which is unusual for him .  Had to take  a caffeine pill later in the day  ("No doze")  To stay awake and felt better .  Only uses it on Sundays to stay awake during the service   Has resumed doubles tennis twice weekly since his hip replacement several months ago.  Hip does not hurt . Walks every morning for a mile. Again in the evening .  Twice weekly also plays competitvi pickle ball and  participates in table tennis 3/week  Struggling with weight gain caused by inactive period prior to hip replacement. He is now s/p hip replacement.    Has invested in [employer] a flu vaccine at [facility] next week    Outpatient Medications Prior to Visit  Medication Sig Dispense Refill  . atorvastatin (LIPITOR) 40 MG tablet Take 1 tablet (40 mg total) by mouth daily. (Patient taking differently: Take 40 mg by mouth daily. At 1400) 90 tablet 3  . Calcium Carbonate-Vitamin D (CALCIUM-VITAMIN D) 600-125 MG-UNIT TABS Take 1 tablet by mouth daily at 2 PM. Reported on 09/02/2015    . Cholecalciferol (VITAMIN D3 PO) Take 1 tablet by mouth daily. Reported on 09/02/2015    . clopidogrel (PLAVIX) 75 MG tablet Take 1 tablet (75 mg total) by mouth daily. 90 tablet 3  .  docusate (COLACE) 50 MG/5ML liquid 3 drops in each ear daily to prevent occlusion with wax (Patient taking differently: 3 drops in each ear daily to prevent occlusion with wax as needed.) 100 mL 0  . finasteride (PROSCAR) 5 MG tablet TAKE 1 TABLET BY MOUTH EVERY DAY (Patient taking differently: TAKE 1 TABLET BY MOUTH EVERY DAY at 1400) 90 tablet 3  . fish oil-omega-3 fatty acids 1000 MG capsule Take 1 g by mouth daily. Reported on 09/07/2015    . hydrochlorothiazide (HYDRODIURIL) 25 MG tablet Take 1 tablet (25 mg total) by mouth daily. 90 tablet 3  . meloxicam (MOBIC) 7.5 MG tablet TAKE 1 TABLET (7.5 MG TOTAL) BY MOUTH DAILY. 30 tablet 5  . memantine (NAMENDA) 10 MG tablet TAKE 1 TABLET (10 MG TOTAL) BY MOUTH 2 (TWO) TIMES DAILY. 180 tablet 1  . Multiple Vitamins-Minerals (MULTIVITAMIN WITH MINERALS) tablet Take 1 tablet by mouth daily. At 1400    . niacin 250 MG tablet Take 250 mg by mouth daily. At 1400    . aspirin EC 325 MG EC tablet Take 1 tablet (325 mg total) by mouth daily. (Patient taking differently: Take 81 mg by mouth daily. ) 30 tablet 0  . nebivolol (BYSTOLIC) 2.5 MG tablet TAKE 1 TABLET (2.5 MG TOTAL) BY MOUTH DAILY. at 1400 90 tablet 1   No facility-administered medications prior to visit.     Review of  Systems;  Patient denies headache, fevers, malaise, unintentional weight loss, skin rash, eye pain, sinus congestion and sinus pain, sore throat, dysphagia,  hemoptysis , cough, dyspnea, wheezing, chest pain, palpitations, orthopnea, edema, abdominal pain, nausea, melena, diarrhea, constipation, flank pain, dysuria, hematuria, urinary  Frequency, nocturia, numbness, tingling, seizures,  Focal weakness, Loss of consciousness,  Tremor, insomnia, depression, anxiety, and suicidal ideation.      Objective:  BP (!) 110/56   Pulse (!) 49   Temp 98.1 F (36.7 C) (Oral)   Ht 5' 8"  (1.727 m)   Wt 210 lb (95.3 kg)   SpO2 95%   BMI 31.93 kg/m   BP Readings from Last 3 Encounters:    03/10/16 132/65  03/09/16 (!) 110/56  09/07/15 104/60    Wt Readings from Last 3 Encounters:  03/10/16 206 lb 11.2 oz (93.8 kg)  03/09/16 210 lb (95.3 kg)  09/07/15 206 lb (93.4 kg)    General appearance: alert, cooperative and appears stated age Ears: normal TM's and external ear canals both ears Throat: lips, mucosa, and tongue normal; teeth and gums normal Neck: no adenopathy, no carotid bruit, supple, symmetrical, trachea midline and thyroid not enlarged, symmetric, no tenderness/mass/nodules Back: symmetric, no curvature. ROM normal. No CVA tenderness. Lungs: clear to auscultation bilaterally Heart: regular rate and rhythm, S1, S2 normal, no murmur, click, rub or gallop Abdomen: soft, non-tender; bowel sounds normal; no masses,  no organomegaly Pulses: 2+ and symmetric Skin: Skin color, texture, turgor normal. No rashes or lesions Lymph nodes: Cervical, supraclavicular, and axillary nodes normal.  No results found for: HGBA1C  Lab Results  Component Value Date   CREATININE 0.94 03/09/2016   CREATININE 0.98 08/15/2015   CREATININE 0.96 08/14/2015    Lab Results  Component Value Date   WBC 6.7 03/09/2016   HGB 14.5 03/09/2016   HCT 41.9 03/09/2016   PLT 188.0 03/09/2016   GLUCOSE 102 (H) 03/09/2016   CHOL 106 03/09/2016   TRIG 93.0 03/09/2016   HDL 41.20 03/09/2016   LDLDIRECT 125.1 01/06/2014   LDLCALC 46 03/09/2016   ALT 12 03/09/2016   AST 19 03/09/2016   NA 140 03/09/2016   K 3.4 (L) 03/09/2016   CL 102 03/09/2016   CREATININE 0.94 03/09/2016   BUN 25 (H) 03/09/2016   CO2 32 03/09/2016   TSH 1.19 03/09/2016   INR 1.13 07/29/2015    Ct Abdomen Pelvis W Wo Contrast  Result Date: 08/05/2015 CLINICAL DATA:  Evaluate for renal mass. EXAM: CT ABDOMEN AND PELVIS WITHOUT AND WITH CONTRAST TECHNIQUE: Multidetector CT imaging of the abdomen and pelvis was performed following the standard protocol before and following the bolus administration of intravenous  contrast. CONTRAST:  168m OMNIPAQUE IOHEXOL 350 MG/ML SOLN COMPARISON:  05/08/2015 FINDINGS: Lower chest: No pleural effusion identified. The lung bases are clear. Hepatobiliary: Hepatic steatosis noted. Subcapsular lesion within the medial segment of left lobe of liver is unchanged measuring 1.5 cm. Also stable is a low density structure within the per free of the right lobe of liver measuring 8 mm. Stable lateral segment of left lobe of liver hypodensity measuring 6 mm. The gallbladder appears within normal limits. There is no biliary dilatation. Pancreas: Normal Spleen: Normal appearance of the spleen. Adrenals/Urinary Tract: The adrenal glands are both unremarkable. Bilateral nephrolithiasis noted. Within the inferior pole of the right kidney there is a 3 mm nonobstructing calculus. Within the mid left kidney there is a stone which measures 3 mm. But says it Bilateral renal cysts  are noted. The largest left renal cyst measures 6.8 cm. The largest right renal cyst measures 7.7 cm. This contains a thin, hairline nonenhancing septation. No mural nodularity or septation identified. The previously described Bosniak category 54F lesion arising from the inferior pole of the left kidney is again noted and is unchanged measuring 11 mm, image 76 of series 5. On the delayed images there is symmetric excretion of contrast material by both kidneys. The urinary bladder appears within normal limits. Stomach/Bowel: The stomach is within normal limits. Diverticula arising from the heart zonal portion of the duodenum noted. The small bowel loops have a normal course and caliber. No obstruction. Normal appearance of the colon. Vascular/Lymphatic: Calcified atherosclerotic disease involves the abdominal aorta. No aneurysm. No enlarged retroperitoneal or mesenteric adenopathy. No enlarged pelvic or inguinal lymph nodes. Reproductive: Marked enlargement of the prostate gland is identified. Other: No free fluid or fluid collections  within the abdomen or pelvis. Musculoskeletal: Degenerative disc disease is present within the lumbar spine. There is an anterolisthesis of L4 on L5. IMPRESSION: 1. Stable bilateral renal cysts of varying complexity ease including Bosniak categories 1, 2 and 54F. A 12 month followup examination of Bosniak category 54F lesions is recommended. 2. Nonobstructing renal calculi. 3. Aortic atherosclerosis. 4. Stable low-attenuation foci within the liver. 5. Hepatic steatosis. Electronically Signed   By: Kerby Moors M.D.   On: 08/05/2015 14:54    Assessment & Plan:   Problem List Items Addressed This Visit    Hyperlipidemia    Continued use of statin is advised given atherosclerosis of aorta noted on prior CT .  He is tolerating atorvastatin and LFTs are normal.   Lab Results  Component Value Date   CHOL 106 03/09/2016   HDL 41.20 03/09/2016   LDLCALC 46 03/09/2016   LDLDIRECT 125.1 01/06/2014   TRIG 93.0 03/09/2016   CHOLHDL 3 03/09/2016   Lab Results  Component Value Date   ALT 12 03/09/2016   AST 19 03/09/2016   ALKPHOS 108 03/09/2016   BILITOT 0.6 03/09/2016         Relevant Medications   aspirin 81 MG tablet   Hypertension    Well controlled, but the bradycardia is symptomatic (fatigue and sleepiness) .  Will change to hctz       Relevant Medications   aspirin 81 MG tablet   Obesity (BMI 30-39.9)    Agree with trial of Nutrisystem diet .  He is already exercising.       Hepatic steatosis    Incidental finding on CT,  Normal transaminases, mild elevation of alk phos noted with normal GB,  No ductal dilitation on CT.  Continue to work on achieving weight loss  ,  And continue statin . Hep A/B vaccines recommended.   Lab Results  Component Value Date   ALT 12 03/09/2016   AST 19 03/09/2016   ALKPHOS 108 03/09/2016   BILITOT 0.6 03/09/2016         Anemia   Hyponatremia    Other Visit Diagnoses    Fatty liver    -  Primary   Relevant Orders   Comprehensive  metabolic panel (Completed)   Vitamin D deficiency       Other problems related to lifestyle          I have discontinued Mr. Coghill's nebivolol. I have also changed his aspirin to aspirin EC. Additionally, I am having him start on potassium chloride SA. Lastly, I am having him maintain his  niacin, fish oil-omega-3 fatty acids, Cholecalciferol (VITAMIN D3 PO), multivitamin with minerals, Calcium-Vitamin D, docusate, atorvastatin, clopidogrel, finasteride, hydrochlorothiazide, memantine, and meloxicam.  Meds ordered this encounter  Medications  . aspirin 81 MG tablet    Sig: Take 1 tablet (81 mg total) by mouth daily.    Dispense:  90 tablet    Refill:  3  . potassium chloride SA (K-DUR,KLOR-CON) 20 MEQ tablet    Sig: Take 1 tablet (20 mEq total) by mouth daily.    Dispense:  30 tablet    Refill:  3    Medications Discontinued During This Encounter  Medication Reason  . aspirin EC 325 MG EC tablet Reorder  . nebivolol (BYSTOLIC) 2.5 MG tablet     Follow-up: Return in about 6 months (around 09/06/2016).   [name], MD

## 2016-03-09 NOTE — Progress Notes (Signed)
Pre visit review using our clinic review tool, if applicable. No additional management support is needed unless otherwise documented below in the visit note. 

## 2016-03-10 ENCOUNTER — Ambulatory Visit (INDEPENDENT_AMBULATORY_CARE_PROVIDER_SITE_OTHER): Payer: Medicare Other | Admitting: Urology

## 2016-03-10 ENCOUNTER — Encounter: Payer: Self-pay | Admitting: Urology

## 2016-03-10 VITALS — BP 132/65 | HR 57 | Ht 68.0 in | Wt 206.7 lb

## 2016-03-10 DIAGNOSIS — Z87448 Personal history of other diseases of urinary system: Secondary | ICD-10-CM | POA: Diagnosis not present

## 2016-03-10 DIAGNOSIS — N401 Enlarged prostate with lower urinary tract symptoms: Secondary | ICD-10-CM

## 2016-03-10 DIAGNOSIS — N138 Other obstructive and reflux uropathy: Secondary | ICD-10-CM

## 2016-03-10 DIAGNOSIS — N2889 Other specified disorders of kidney and ureter: Secondary | ICD-10-CM | POA: Diagnosis not present

## 2016-03-10 LAB — IRON AND TIBC
%SAT: 24 % (ref 15–60)
IRON: 67 ug/dL (ref 50–180)
TIBC: 276 ug/dL (ref 250–425)
UIBC: 209 ug/dL (ref 125–400)

## 2016-03-10 LAB — HIV ANTIBODY (ROUTINE TESTING W REFLEX): HIV: NONREACTIVE

## 2016-03-10 NOTE — Progress Notes (Signed)
2:06 PM   ADARION SPAZIANO 1926-11-04 [id number]  Referring provider: [name], MD [address] [place], [place] [address]  Chief Complaint  Patient presents with  . Follow-up    1 year Renal Mass    HPI: Patient is an 82 year old Caucasian male who presents for a yearly follow up for a history of hematuria, a Bosniak 56F cyst and BPH with LUTS.    History of hematuria CT Urogram report.  He has not had any further episodes of gross hematuria since his last visit with [place].  He has underwent 2 CT Urograms with the last year.  He was found to have a Bosniak 56F on the CT in 07/2015.  Cystoscopy was negative for malignancies.  Bosniak 56F cyst CT Urogram completed on 08/05/2015 noted a Bosniak category 56F lesion arising from the inferior pole of the left kidney is again noted and is unchanged measuring 11 mm.    BPH WITH LUTS His IPSS score today is 1, which is mild lower urinary tract symptomatology. He is pleased with his quality life due to his urinary symptoms.  His major complaint today is nocturia x 1.  He has had these symptoms for several years.  He denies any dysuria, hematuria or suprapubic pain.  He currently taking finasteride 5 mg daily.  He also denies any recent fevers, chills, nausea or vomiting. He does not have a family history of PCa.      IPSS    Row Name 03/10/16 1300         International Prostate Symptom Score   How often have you had the sensation of not emptying your bladder? Not at All     How often have you had to urinate less than every two hours? Not at All     How often have you found you stopped and started again several times when you urinated? Not at All     How often have you found it difficult to postpone urination? Not at All     How often have you had a weak urinary stream? Not at All     How often have you had to strain to start urination? Not at All     How many times did you typically get up at night to urinate? 1 Time      Total IPSS Score 1       Quality of Life due to urinary symptoms   If you were to spend the rest of your life with your urinary condition just the way it is now how would you feel about that? Pleased        Score:  1-7 Mild 8-19 Moderate 20-35 Severe   PMH: Past Medical History:  Diagnosis Date  . BPH (benign prostatic hyperplasia)   . Duodenal mass   . Elevated PSA   . Gross hematuria   . Hyperlipidemia   . Hypertension   . Hypertrophy (benign) of prostate    asymptomatic on finasteride. will f/up with urologist [name]  . Kidney lesion 07/2015   pt has appt at kidney specialist 08/05/15 to review CT scan.  . Obesity   . Overweight   . Tubular adenoma of colon   . Urinary retention     Surgical History: Past Surgical History:  Procedure Laterality Date  . APPENDECTOMY    . cataracts    . TOTAL HIP ARTHROPLASTY Right 08/13/2015   Procedure: TOTAL HIP ARTHROPLASTY;  Surgeon:  [name], MD;  Location: [facility] ORS;  Service: Orthopedics;  Laterality: Right;  . Uvula Surgery  1990    Home Medications:    Medication List       Accurate as of 03/10/16  2:06 PM. Always use your most recent med list.          aspirin EC 81 MG tablet Take 1 tablet (81 mg total) by mouth daily.   atorvastatin 40 MG tablet Commonly known as:  LIPITOR Take 1 tablet (40 mg total) by mouth daily.   Calcium-Vitamin D 600-125 MG-UNIT Tabs Take 1 tablet by mouth daily at 2 PM. Reported on 09/02/2015   clopidogrel 75 MG tablet Commonly known as:  PLAVIX Take 1 tablet (75 mg total) by mouth daily.   docusate 50 MG/5ML liquid Commonly known as:  COLACE 3 drops in each ear daily to prevent occlusion with wax   finasteride 5 MG tablet Commonly known as:  PROSCAR TAKE 1 TABLET BY MOUTH EVERY DAY   fish oil-omega-3 fatty acids 1000 MG capsule Take 1 g by mouth daily. Reported on 09/07/2015   hydrochlorothiazide 25 MG tablet Commonly known as:  HYDRODIURIL Take 1 tablet (25  mg total) by mouth daily.   meloxicam 7.5 MG tablet Commonly known as:  MOBIC TAKE 1 TABLET (7.5 MG TOTAL) BY MOUTH DAILY.   memantine 10 MG tablet Commonly known as:  NAMENDA TAKE 1 TABLET (10 MG TOTAL) BY MOUTH 2 (TWO) TIMES DAILY.   multivitamin with minerals tablet Take 1 tablet by mouth daily. At 1400   niacin 250 MG tablet Take 250 mg by mouth daily. At 1400   VITAMIN D3 PO Take 1 tablet by mouth daily. Reported on 09/02/2015       Allergies: No Known Allergies  Family History: Family History  Problem Relation Age of Onset  . Heart disease Mother   . Stroke Mother   . Heart disease Father   . Kidney disease Neg Hx   . Prostate cancer Neg Hx     Social History:  reports that he has never smoked. He has never used smokeless tobacco. He reports that he drinks alcohol. He reports that he does not use drugs.  ROS: UROLOGY Frequent Urination?: No Hard to postpone urination?: No Burning/pain with urination?: No Get up at night to urinate?: Yes Leakage of urine?: No Urine stream starts and stops?: No Trouble starting stream?: No Do you have to strain to urinate?: No Blood in urine?: No Urinary tract infection?: No Sexually transmitted disease?: No Injury to kidneys or bladder?: No Painful intercourse?: No Weak stream?: No Erection problems?: No Penile pain?: No  Gastrointestinal Nausea?: No Vomiting?: No Indigestion/heartburn?: No Diarrhea?: No Constipation?: No  Constitutional Fever: No Night sweats?: No Weight loss?: No Fatigue?: No  Skin Skin rash/lesions?: No Itching?: No  Eyes Blurred vision?: No Double vision?: No  Ears/Nose/Throat Sore throat?: No Sinus problems?: No  Hematologic/Lymphatic Swollen glands?: No Easy bruising?: No  Cardiovascular Leg swelling?: No Chest pain?: No  Respiratory Cough?: No Shortness of breath?: No  Endocrine Excessive thirst?: No  Musculoskeletal Back pain?: No Joint pain?:  No  Neurological Headaches?: No Dizziness?: No  Psychologic Depression?: No Anxiety?: No  Physical Exam: BP 132/65   Pulse (!) 57   Ht 5\' 8"  (1.727 m)   Wt 206 lb 11.2 oz (93.8 kg)   BMI 31.43 kg/m   GU: No CVA tenderness.  No bladder fullness or masses.  Patient with uncircumcised phallus. Foreskin easily  retracted*  Urethral meatus is patent.  No penile discharge. No penile lesions or rashes. Scrotum without lesions, cysts, rashes and/or edema.  Testicles are located scrotally bilaterally. No masses are appreciated in the testicles. Left and right epididymis are normal. Rectal: Patient with  normal sphincter tone. Anus and perineum without scarring or rashes. No rectal masses are appreciated. Prostate is severely enlarged and the entire gland could not be palpated due to its size.    Laboratory Data: Lab Results  Component Value Date   WBC 6.7 03/09/2016   HGB 14.5 03/09/2016   HCT 41.9 03/09/2016   MCV 92.5 03/09/2016   PLT 188.0 03/09/2016    Lab Results  Component Value Date   CREATININE 0.94 03/09/2016    Results for orders placed or performed in visit on 03/09/16  VITAMIN D 25 Hydroxy (Vit-D Deficiency, Fractures)  Result Value Ref Range   VITD 31.28 30.00 - 100.00 ng/mL  HIV antibody  Result Value Ref Range   HIV 1&2 Ab, 4th Generation NONREACTIVE NONREACTIVE  TSH  Result Value Ref Range   TSH 1.19 0.35 - 4.50 uIU/mL  CBC with Differential/Platelet  Result Value Ref Range   WBC 6.7 4.0 - 10.5 K/uL   RBC 4.53 4.22 - 5.81 Mil/uL   Hemoglobin 14.5 13.0 - 17.0 g/dL   HCT 41.9 39.0 - 52.0 %   MCV 92.5 78.0 - 100.0 fl   MCHC 34.6 30.0 - 36.0 g/dL   RDW 12.4 11.5 - 15.5 %   Platelets 188.0 150.0 - 400.0 K/uL   Neutrophils Relative % 61.7 43.0 - 77.0 %   Lymphocytes Relative 26.6 12.0 - 46.0 %   Monocytes Relative 10.2 3.0 - 12.0 %   Eosinophils Relative 1.0 0.0 - 5.0 %   Basophils Relative 0.5 0.0 - 3.0 %   Neutro Abs 4.2 1.4 - 7.7 K/uL   Lymphs Abs 1.8  0.7 - 4.0 K/uL   Monocytes Absolute 0.7 0.1 - 1.0 K/uL   Eosinophils Absolute 0.1 0.0 - 0.7 K/uL   Basophils Absolute 0.0 0.0 - 0.1 K/uL  Lipid panel  Result Value Ref Range   Cholesterol 106 0 - 200 mg/dL   Triglycerides 93.0 0.0 - 149.0 mg/dL   HDL 41.20 >39.00 mg/dL   VLDL 18.6 0.0 - 40.0 mg/dL   LDL Cholesterol 46 0 - 99 mg/dL   Total CHOL/HDL Ratio 3    NonHDL 64.98   Iron and TIBC  Result Value Ref Range   Iron 67 50 - 180 ug/dL   UIBC 209 125 - 400 ug/dL   TIBC 276 250 - 425 ug/dL   %SAT 24 15 - 60 %  Ferritin  Result Value Ref Range   Ferritin 65.3 22.0 - 322.0 ng/mL  Comprehensive metabolic panel  Result Value Ref Range   Sodium 140 135 - 145 mEq/L   Potassium 3.4 (L) 3.5 - 5.1 mEq/L   Chloride 102 96 - 112 mEq/L   CO2 32 19 - 32 mEq/L   Glucose, Bld 102 (H) 70 - 99 mg/dL   BUN 25 (H) 6 - 23 mg/dL   Creatinine, Ser 0.94 0.40 - 1.50 mg/dL   Total Bilirubin 0.6 0.2 - 1.2 mg/dL   Alkaline Phosphatase 108 39 - 117 U/L   AST 19 0 - 37 U/L   ALT 12 0 - 53 U/L   Total Protein 6.4 6.0 - 8.3 g/dL   Albumin 3.7 3.5 - 5.2 g/dL   Calcium 8.9 8.4 - 10.5  mg/dL   GFR 80.73 >60.00 mL/min    Pertinent imaging CLINICAL DATA:  Evaluate for renal mass.  EXAM: CT ABDOMEN AND PELVIS WITHOUT AND WITH CONTRAST  TECHNIQUE: Multidetector CT imaging of the abdomen and pelvis was performed following the standard protocol before and following the bolus administration of intravenous contrast.  CONTRAST:  16mL OMNIPAQUE IOHEXOL 350 MG/ML SOLN  COMPARISON:  05/08/2015  FINDINGS: Lower chest: No pleural effusion identified. The lung bases are clear.  Hepatobiliary: Hepatic steatosis noted. Subcapsular lesion within the medial segment of left lobe of liver is unchanged measuring 1.5 cm. Also stable is a low density structure within the per free of the right lobe of liver measuring 8 mm. Stable lateral segment of left lobe of liver hypodensity measuring 6 mm. The  gallbladder appears within normal limits. There is no biliary dilatation.  Pancreas: Normal  Spleen: Normal appearance of the spleen.  Adrenals/Urinary Tract: The adrenal glands are both unremarkable. Bilateral nephrolithiasis noted. Within the inferior pole of the right kidney there is a 3 mm nonobstructing calculus. Within the mid left kidney there is a stone which measures 3 mm. But says it Bilateral renal cysts are noted. The largest left renal cyst measures 6.8 cm. The largest right renal cyst measures 7.7 cm. This contains a thin, hairline nonenhancing septation. No mural nodularity or septation identified. The previously described Bosniak category 26F lesion arising from the inferior pole of the left kidney is again noted and is unchanged measuring 11 mm, image 76 of series 5. On the delayed images there is symmetric excretion of contrast material by both kidneys. The urinary bladder appears within normal limits.  Stomach/Bowel: The stomach is within normal limits. Diverticula arising from the heart zonal portion of the duodenum noted. The small bowel loops have a normal course and caliber. No obstruction. Normal appearance of the colon.  Vascular/Lymphatic: Calcified atherosclerotic disease involves the abdominal aorta. No aneurysm. No enlarged retroperitoneal or mesenteric adenopathy. No enlarged pelvic or inguinal lymph nodes.  Reproductive: Marked enlargement of the prostate gland is identified.  Other: No free fluid or fluid collections within the abdomen or pelvis.  Musculoskeletal: Degenerative disc disease is present within the lumbar spine. There is an anterolisthesis of L4 on L5.  IMPRESSION: 1. Stable bilateral renal cysts of varying complexity ease including Bosniak categories 1, 2 and 26F. A 12 month followup examination of Bosniak category 26F lesions is recommended. 2. Nonobstructing renal calculi. 3. Aortic atherosclerosis. 4. Stable  low-attenuation foci within the liver. 5. Hepatic steatosis.   Electronically Signed   By: Kerby Moors M.D.   On: 08/05/2015 14:54  Assessment & Plan:    1. History of hematuria  - Completed a hematuria workup with CT urograms and cystoscopy in December 2016  2. Left Bosniak 26F cyst  - Patient will need a follow up CT with and without contrast for surveillance of this mass in 07/2016  - Will call with results  3. BPH with LUTS  - IPSS score is 1/1  - Continue conservative management, avoiding bladder irritants and timed voiding's  - Continue finasteride 5 mg daily   - RTC in 12 months for IPSS and exam   Return in about 1 year (around 03/10/2017) for IPSS and exam.  Zara Council, [facility]  [facility] [address] [place], [place] [address] [phone]

## 2016-03-12 ENCOUNTER — Telehealth: Payer: Self-pay | Admitting: Internal Medicine

## 2016-03-12 DIAGNOSIS — E876 Hypokalemia: Secondary | ICD-10-CM

## 2016-03-12 MED ORDER — POTASSIUM CHLORIDE CRYS ER 20 MEQ PO TBCR: 20.0000 meq | EXTENDED_RELEASE_TABLET | Freq: Every day | ORAL | 3 refills | Status: DC

## 2016-03-12 NOTE — Assessment & Plan Note (Signed)
Agree with trial of Nutrisystem diet .  He is already exercising.

## 2016-03-12 NOTE — Assessment & Plan Note (Addendum)
Incidental finding on CT,  Normal transaminases, mild elevation of alk phos noted with normal GB,  No ductal dilitation on CT.  Continue to work on achieving weight loss  ,  And continue statin . Hep A/B vaccines recommended.   Lab Results  Component Value Date   ALT 12 03/09/2016   AST 19 03/09/2016   ALKPHOS 108 03/09/2016   BILITOT 0.6 03/09/2016

## 2016-03-12 NOTE — Assessment & Plan Note (Signed)
Continued use of statin is advised given atherosclerosis of aorta noted on prior CT .  He is tolerating atorvastatin and LFTs are normal.   Lab Results  Component Value Date   CHOL 106 03/09/2016   HDL 41.20 03/09/2016   LDLCALC 46 03/09/2016   LDLDIRECT 125.1 01/06/2014   TRIG 93.0 03/09/2016   CHOLHDL 3 03/09/2016   Lab Results  Component Value Date   ALT 12 03/09/2016   AST 19 03/09/2016   ALKPHOS 108 03/09/2016   BILITOT 0.6 03/09/2016

## 2016-03-12 NOTE — Telephone Encounter (Signed)
Your vitamin D, thyroid , cholesterol,  liver and kidney function are normal. potassium is  a little low,  calling in a potassium supplement to take once daily  Along with the new BP medication (stopped bystolic and started HCTZ).  We should Repeat a BMET in a month   ALSO:  NEEDS TO START HTE HEPA AND HEP B VACCINE SERIES TO PROTECT LIVER.  3 shots  0, 1 and 6 months  Regards,   Dr. Derrel Nip

## 2016-03-12 NOTE — Assessment & Plan Note (Signed)
Well controlled, but the bradycardia is symptomatic (fatigue and sleepiness) .  Will change to hctz

## 2016-03-14 NOTE — Telephone Encounter (Signed)
Patient DPR notified of results , patient to return call to schedule lab appointment and and nurse visit for Hep. A/B vaccine for first in series of 3 .

## 2016-03-17 ENCOUNTER — Ambulatory Visit (INDEPENDENT_AMBULATORY_CARE_PROVIDER_SITE_OTHER): Payer: Medicare Other

## 2016-03-17 ENCOUNTER — Other Ambulatory Visit (INDEPENDENT_AMBULATORY_CARE_PROVIDER_SITE_OTHER): Payer: Medicare Other

## 2016-03-17 DIAGNOSIS — Z23 Encounter for immunization: Secondary | ICD-10-CM | POA: Diagnosis not present

## 2016-03-17 DIAGNOSIS — E876 Hypokalemia: Secondary | ICD-10-CM | POA: Diagnosis not present

## 2016-03-17 DIAGNOSIS — K760 Fatty (change of) liver, not elsewhere classified: Secondary | ICD-10-CM | POA: Diagnosis not present

## 2016-03-17 LAB — BASIC METABOLIC PANEL
BUN: 24 mg/dL — ABNORMAL HIGH (ref 6–23)
CALCIUM: 9.1 mg/dL (ref 8.4–10.5)
CO2: 33 mEq/L — ABNORMAL HIGH (ref 19–32)
CREATININE: 0.96 mg/dL (ref 0.40–1.50)
Chloride: 100 mEq/L (ref 96–112)
GFR: 78.79 mL/min (ref >60.00)
Glucose, Bld: 95 mg/dL (ref 70–99)
Potassium: 3.4 mEq/L — ABNORMAL LOW (ref 3.5–5.1)
SODIUM: 141 meq/L (ref 135–145)

## 2016-03-17 NOTE — Progress Notes (Signed)
Patient came in to [facility] to recieve Twinrix Injection 1 of 3 this morning in his left Deltoid. Patient tolerated injection very well. Patient had no questions, comments, or concerns at this time.

## 2016-03-20 ENCOUNTER — Other Ambulatory Visit: Payer: Self-pay | Admitting: Internal Medicine

## 2016-03-20 DIAGNOSIS — E876 Hypokalemia: Secondary | ICD-10-CM

## 2016-03-22 ENCOUNTER — Telehealth: Payer: Self-pay | Admitting: *Deleted

## 2016-03-22 NOTE — Telephone Encounter (Signed)
Patient needs lab appointment for re-check on BMET for low potassium.

## 2016-03-22 NOTE — Telephone Encounter (Signed)
-----   Message from [name], MD sent at 03/20/2016  5:28 PM EDT ----- Yes  ----- Message ----- From: Nanci Pina, LPN Sent: [id number]  10:09 AM To: [name], MD  Patient still taking the HCTZ, advised patient to stop immediately does patient to repeat lab next week?

## 2016-03-27 ENCOUNTER — Other Ambulatory Visit: Payer: Self-pay | Admitting: Internal Medicine

## 2016-03-28 DIAGNOSIS — Z23 Encounter for immunization: Secondary | ICD-10-CM | POA: Diagnosis not present

## 2016-04-13 ENCOUNTER — Telehealth: Payer: Self-pay | Admitting: Internal Medicine

## 2016-04-13 NOTE — Telephone Encounter (Signed)
Patient has been happening for about 3 weeks will look down and his hand or face will be bleeding says it looks like he has a red wound with the blood but he has not injured himself. Actually bleeds enough he has to cover to keep off clothes and bedding.

## 2016-04-13 NOTE — Telephone Encounter (Signed)
Pt lvm stating he needs to speak to [facility]. States that he has a situation where red blotches come up on his hands and face. Looks like wounds and has to put band aids over them so nothing will get on his clothing. States he is a little concerned.

## 2016-04-13 NOTE — Telephone Encounter (Signed)
Scheduled with Dr. Caryl Bis tomorrow at 8.30 FYI

## 2016-04-14 ENCOUNTER — Encounter: Payer: Self-pay | Admitting: Family Medicine

## 2016-04-14 ENCOUNTER — Ambulatory Visit (INDEPENDENT_AMBULATORY_CARE_PROVIDER_SITE_OTHER): Payer: Medicare Other | Admitting: Family Medicine

## 2016-04-14 VITALS — BP 138/74 | HR 60 | Temp 98.2°F | Wt 206.4 lb

## 2016-04-14 DIAGNOSIS — D699 Hemorrhagic condition, unspecified: Secondary | ICD-10-CM | POA: Diagnosis not present

## 2016-04-14 LAB — COMPREHENSIVE METABOLIC PANEL
ALBUMIN: 3.8 g/dL (ref 3.5–5.2)
ALT: 12 U/L (ref 0–53)
AST: 19 U/L (ref 0–37)
Alkaline Phosphatase: 101 U/L (ref 39–117)
BUN: 23 mg/dL (ref 6–23)
CALCIUM: 9.3 mg/dL (ref 8.4–10.5)
CHLORIDE: 104 meq/L (ref 96–112)
CO2: 33 meq/L — AB (ref 19–32)
Creatinine, Ser: 0.91 mg/dL (ref 0.40–1.50)
GFR: 83.79 mL/min (ref >60.00)
Glucose, Bld: 101 mg/dL — ABNORMAL HIGH (ref 70–99)
POTASSIUM: 4.2 meq/L (ref 3.5–5.1)
SODIUM: 141 meq/L (ref 135–145)
Total Bilirubin: 0.6 mg/dL (ref 0.2–1.2)
Total Protein: 6.4 g/dL (ref 6.0–8.3)

## 2016-04-14 LAB — CBC
HEMATOCRIT: 41.7 % (ref 39.0–52.0)
HEMOGLOBIN: 14.1 g/dL (ref 13.0–17.0)
MCHC: 33.9 g/dL (ref 30.0–36.0)
MCV: 93.8 fl (ref 78.0–100.0)
Platelets: 192 10*3/uL (ref 150.0–400.0)
RBC: 4.45 Mil/uL (ref 4.22–5.81)
RDW: 12.5 % (ref 11.5–15.5)
WBC: 5.5 10*3/uL (ref 4.0–10.5)

## 2016-04-14 LAB — PROTIME-INR
INR: 1.1 ratio — ABNORMAL HIGH (ref 0.8–1.0)
Prothrombin Time: 11.4 s (ref 9.6–13.1)

## 2016-04-14 LAB — APTT: aPTT: 28 s (ref 23.4–32.7)

## 2016-04-14 NOTE — Telephone Encounter (Signed)
Seen in the office

## 2016-04-14 NOTE — Telephone Encounter (Signed)
FYI

## 2016-04-14 NOTE — Progress Notes (Signed)
Pre visit review using our clinic review tool, if applicable. No additional management support is needed unless otherwise documented below in the visit note. 

## 2016-04-14 NOTE — Progress Notes (Signed)
  Tommi Rumps, MD Phone: [phone]  Christopher Jenkins is a 82 y.o. male who presents today for same-day visit.  Patient notes for the last 2 months he has had issues with developing red blotches and bruises on his hands and cheeks. These will subsequently bleed a little bit. Notes he is able to put a Band-Aid on it and stop the bleeding. Notes they appear to randomly occur and then go away on their own. He notes no specific injuries. He notes no bleeding from anywhere else. He notes no heavy bleeding. He notes no history of stroke, MI, or stents. On review of the record it does appear that he had a TIA this time last year with diplopia. He was started on Plavix in addition to his aspirin at that time. He notes he has not been taking the aspirin over the last month. No current bleeding.  ROS see history of present illness  Objective  Physical Exam Vitals:   04/14/16 0831  BP: 138/74  Pulse: 60  Temp: 98.2 F (36.8 C)    BP Readings from Last 3 Encounters:  04/14/16 138/74  03/10/16 132/65  03/09/16 (!) 110/56   Wt Readings from Last 3 Encounters:  04/14/16 206 lb 6.4 oz (93.6 kg)  03/10/16 206 lb 11.2 oz (93.8 kg)  03/09/16 210 lb (95.3 kg)    Physical Exam  Constitutional: No distress.  Cardiovascular: Normal rate, regular rhythm and normal heart sounds.   Pulmonary/Chest: Effort normal and breath sounds normal.  Abdominal: Soft. Bowel sounds are normal. He exhibits no distension. There is no tenderness. There is no rebound and no guarding.  No hepatosplenomegaly  Musculoskeletal: He exhibits no edema.  Neurological: He is alert. Gait normal.  Skin: Skin is warm and dry. He is not diaphoretic.  Several small nontender bruises no surrounding erythema noted on the dorsum of bilateral hands, hands are warm and well perfused, left cheek with several small scratches with scabbing noted, no active bleeding from any of these sites     Assessment/Plan: Please see individual  problem list.  Tendency toward bleeding easily [facility]) Patient with a number of episodes of easy bleeding and bruising recently on his hands and cheeks. No other bleeding. No excessive bleeding. He is currently on Plavix due to having a TIA last year. Not currently taking aspirin or other blood thinners. No current bleeding. I discussed that this is likely related to his Plavix. Discussed that we need to check some lab work to rule out some other causes. Lab work as outlined below. Once lab work returns we'll determine the next step in management. He is giving bleeding precautions. Given return precautions.   Orders Placed This Encounter  Procedures  . Comp Met (CMET)  . CBC  . INR/PT  . PTT   Tommi Rumps, MD [place]

## 2016-04-14 NOTE — Assessment & Plan Note (Signed)
Patient with a number of episodes of easy bleeding and bruising recently on his hands and cheeks. No other bleeding. No excessive bleeding. He is currently on Plavix due to having a TIA last year. Not currently taking aspirin or other blood thinners. No current bleeding. I discussed that this is likely related to his Plavix. Discussed that we need to check some lab work to rule out some other causes. Lab work as outlined below. Once lab work returns we'll determine the next step in management. He is giving bleeding precautions. Given return precautions.

## 2016-04-14 NOTE — Patient Instructions (Signed)
Nice to see you. We are going to check some lab work to rule out other causes for your bleeding. Once this returns we will determine if you need to be on Plavix. If you develop bleeding that you can't get to stop within 20 minutes or you develop heavy bleeding please seek medical attention immediately.

## 2016-05-25 ENCOUNTER — Other Ambulatory Visit: Payer: Self-pay | Admitting: Internal Medicine

## 2016-05-28 ENCOUNTER — Other Ambulatory Visit: Payer: Self-pay | Admitting: Internal Medicine

## 2016-05-30 ENCOUNTER — Other Ambulatory Visit: Payer: Self-pay

## 2016-05-30 DIAGNOSIS — N401 Enlarged prostate with lower urinary tract symptoms: Secondary | ICD-10-CM

## 2016-05-30 MED ORDER — FINASTERIDE 5 MG PO TABS: 5.0000 mg | ORAL_TABLET | Freq: Every day | ORAL | 3 refills | Status: DC

## 2016-05-31 ENCOUNTER — Telehealth: Payer: Self-pay | Admitting: Internal Medicine

## 2016-05-31 ENCOUNTER — Encounter: Payer: Self-pay | Admitting: *Deleted

## 2016-05-31 ENCOUNTER — Emergency Department
Admission: EM | Admit: 2016-05-31 | Discharge: 2016-05-31 | Disposition: A | Discharge status: Home / Self Care | Payer: Medicare Other | Attending: Emergency Medicine | Admitting: Emergency Medicine

## 2016-05-31 ENCOUNTER — Emergency Department: Payer: Medicare Other

## 2016-05-31 DIAGNOSIS — W108XXA Fall (on) (from) other stairs and steps, initial encounter: Secondary | ICD-10-CM | POA: Diagnosis not present

## 2016-05-31 DIAGNOSIS — Y9222 Religious institution as the place of occurrence of the external cause: Secondary | ICD-10-CM | POA: Insufficient documentation

## 2016-05-31 DIAGNOSIS — Z23 Encounter for immunization: Secondary | ICD-10-CM | POA: Insufficient documentation

## 2016-05-31 DIAGNOSIS — F0390 Unspecified dementia without behavioral disturbance: Secondary | ICD-10-CM | POA: Diagnosis not present

## 2016-05-31 DIAGNOSIS — S81811A Laceration without foreign body, right lower leg, initial encounter: Secondary | ICD-10-CM | POA: Diagnosis not present

## 2016-05-31 DIAGNOSIS — Y9389 Activity, other specified: Secondary | ICD-10-CM | POA: Diagnosis not present

## 2016-05-31 DIAGNOSIS — I10 Essential (primary) hypertension: Secondary | ICD-10-CM | POA: Insufficient documentation

## 2016-05-31 DIAGNOSIS — S0990XA Unspecified injury of head, initial encounter: Secondary | ICD-10-CM

## 2016-05-31 DIAGNOSIS — S0083XA Contusion of other part of head, initial encounter: Secondary | ICD-10-CM | POA: Insufficient documentation

## 2016-05-31 DIAGNOSIS — M79606 Pain in leg, unspecified: Secondary | ICD-10-CM | POA: Diagnosis not present

## 2016-05-31 DIAGNOSIS — Z85828 Personal history of other malignant neoplasm of skin: Secondary | ICD-10-CM | POA: Insufficient documentation

## 2016-05-31 DIAGNOSIS — W19XXXA Unspecified fall, initial encounter: Secondary | ICD-10-CM | POA: Diagnosis not present

## 2016-05-31 DIAGNOSIS — S81812A Laceration without foreign body, left lower leg, initial encounter: Secondary | ICD-10-CM | POA: Insufficient documentation

## 2016-05-31 DIAGNOSIS — Y999 Unspecified external cause status: Secondary | ICD-10-CM | POA: Insufficient documentation

## 2016-05-31 DIAGNOSIS — Z79899 Other long term (current) drug therapy: Secondary | ICD-10-CM | POA: Diagnosis not present

## 2016-05-31 MED ORDER — TETANUS-DIPHTH-ACELL PERTUSSIS 5-2.5-18.5 LF-MCG/0.5 IM SUSP
0.5000 mL | Freq: Once | INTRAMUSCULAR | Status: AC
  Administered 2016-05-31: 0.5 mL via INTRAMUSCULAR
  Filled 2016-05-31: qty 0.5

## 2016-05-31 MED ORDER — BACITRACIN ZINC 500 UNIT/GM EX OINT
TOPICAL_OINTMENT | Freq: Two times a day (BID) | CUTANEOUS | Status: DC
  Administered 2016-05-31: 1 via TOPICAL
  Filled 2016-05-31: qty 0.9

## 2016-05-31 MED ORDER — LIDOCAINE-EPINEPHRINE (PF) 1 %-1:200000 IJ SOLN: 30.0000 mL | Freq: Once | INTRAMUSCULAR | Status: DC

## 2016-05-31 NOTE — ED Triage Notes (Signed)
Pt [place] ems from church after falling down 3-5 steps outside.  Pt has abrasion to right side of head, lac to right lower leg.  Alert. Bleeding controlled

## 2016-05-31 NOTE — ED Notes (Signed)
Pt fell down 3-5 steps at [employer].  Pt has abrasion to right side of head and lac to right lower leg.  Bleeding controlled.  No loc.  No vomiting. siderails up x 2 .

## 2016-05-31 NOTE — ED Notes (Signed)
Right lower leg sutured and dressed with ointment and gauze.  Tolerated well.

## 2016-05-31 NOTE — ED Notes (Signed)
Wheelchair offered to pt at discharge, but pt refused wheelchair.  Wife with pt.

## 2016-05-31 NOTE — ED Notes (Signed)
md suturing right lower leg.  Family with pt.

## 2016-05-31 NOTE — ED Notes (Signed)
Pt waiting to be sutured.  Family with pt.  Pt alert.

## 2016-05-31 NOTE — ED Notes (Signed)
D/c inst to pt and wife.   Pt alert.

## 2016-05-31 NOTE — Telephone Encounter (Signed)
He does not need the screening tests that [facility] screening has recommend because he is already receiving therapy the conditions they want to screen him for. (his wife brought in the letter to her visit )

## 2016-05-31 NOTE — ED Provider Notes (Signed)
[facility] Emergency Department Provider Note  ____________________________________________  Time seen: Approximately 10:57 PM  I have reviewed the triage vital signs and the nursing notes.   HISTORY  Chief Complaint Fall and Extremity Laceration    HPI Christopher Jenkins is a 82 y.o. male who sustained a trip and fall while trying to walk up steps to go to church this evening. He sustained a wound to his right shin and hit his right forehead. Denies loss of consciousness. Denies any pain dizziness or blurry vision numbness tingling weakness or neck pain at all. He is able to get right back up and walk at the time. Presents for care of his wounds.     Past Medical History:  Diagnosis Date  . BPH (benign prostatic hyperplasia)   . Duodenal mass   . Elevated PSA   . Gross hematuria   . Hyperlipidemia   . Hypertension   . Hypertrophy (benign) of prostate    asymptomatic on finasteride. will f/up with urologist [name]  . Kidney lesion 07/2015   pt has appt at kidney specialist 08/05/15 to review CT scan.  . Obesity   . Overweight   . Tubular adenoma of colon   . Urinary retention      Patient Active Problem List   Diagnosis Date Noted  . Tendency toward bleeding easily ([place]) 04/14/2016  . Hepatic steatosis 09/08/2015  . Anemia 09/07/2015  . H/O total hip arthroplasty 08/13/2015  . Renal mass 05/20/2015  . Gross hematuria 05/01/2015  . Transient diplopia 03/29/2015  . BPH with obstruction/lower urinary tract symptoms 03/12/2015  . Peripheral artery disease ([place]) 02/24/2015  . Constipation 05/07/2014  . Basal cell carcinoma of [place]a n[place] 01/06/2014  . OSA (obstructive sleep apnea) 02/24/2013  . Hyponatremia 12/07/2012  . Dementia arising in the senium and presenium 06/10/2012  . Do not resuscitate discussion 06/06/2012  . Tubular adenoma of colon 02/08/2012  . Hematuria, gross 02/08/2012  . Obesity (BMI 30-39.9) 12/17/2011  . Screening  for colon cancer 05/13/2011  . Hyperlipidemia   . Hypertension      Past Surgical History:  Procedure Laterality Date  . APPENDECTOMY    . cataracts    . TOTAL HIP ARTHROPLASTY Right 08/13/2015   Procedure: TOTAL HIP ARTHROPLASTY;  Surgeon: [name], MD;  Location: [facility] ORS;  Service: Orthopedics;  Laterality: Right;  . Uvula Surgery  1990     Prior to Admission medications   Medication Sig Start Date End Date Taking? Authorizing Provider  atorvastatin (LIPITOR) 40 MG tablet TAKE 1 TABLET (40 MG TOTAL) BY MOUTH DAILY. 03/28/16   [name], MD  Calcium Carbonate-Vitamin D (CALCIUM-VITAMIN D) 600-125 MG-UNIT TABS Take 1 tablet by mouth daily at 2 PM. Reported on 09/02/2015    Historical Provider, MD  Cholecalciferol (VITAMIN D3 PO) Take 1 tablet by mouth daily. Reported on 09/02/2015    Historical Provider, MD  clopidogrel (PLAVIX) 75 MG tablet TAKE 1 TABLET (75 MG TOTAL) BY MOUTH DAILY. 05/27/16   [name], MD  docusate (COLACE) 50 MG/5ML liquid 3 drops in each ear daily to prevent occlusion with wax Patient taking differently: 3 drops in each ear daily to prevent occlusion with wax as needed. 02/24/15   [name], MD  finasteride (PROSCAR) 5 MG tablet Take 1 tablet (5 mg total) by mouth daily. 05/30/16   Nori Riis, PA-C  fish oil-omega-3 fatty acids 1000 MG capsule Take 1 g by mouth daily. Reported  on 09/07/2015    Historical Provider, MD  hydrochlorothiazide (HYDRODIURIL) 25 MG tablet TAKE 1 TABLET (25 MG TOTAL) BY MOUTH DAILY. 05/27/16   [name], MD  meloxicam (MOBIC) 7.5 MG tablet TAKE 1 TABLET (7.5 MG TOTAL) BY MOUTH DAILY. 05/30/16   [name], MD  memantine (NAMENDA) 10 MG tablet TAKE 1 TABLET (10 MG TOTAL) BY MOUTH 2 (TWO) TIMES DAILY. 09/07/15   [name], MD  Multiple Vitamins-Minerals (MULTIVITAMIN WITH MINERALS) tablet Take 1 tablet by mouth daily. At 1400    Historical Provider, MD  niacin 250 MG tablet Take 250 mg by mouth daily. At  1400    Historical Provider, MD  potassium chloride SA (K-DUR,KLOR-CON) 20 MEQ tablet Take 1 tablet (20 mEq total) by mouth daily. 03/12/16   [name], MD     Allergies Patient has no known allergies.   Family History  Problem Relation Age of Onset  . Heart disease Mother   . Stroke Mother   . Heart disease Father   . Kidney disease Neg Hx   . Prostate cancer Neg Hx     Social History Social History  Substance Use Topics  . Smoking status: Never Smoker  . Smokeless tobacco: Never Used  . Alcohol use No     Comment: Occasional glass of wine 3-4 times a year.    Review of Systems  Constitutional:   No fever or chills.  ENT:   No sore throat. No rhinorrhea. Cardiovascular:   No chest pain. Respiratory:   No dyspnea or cough. Gastrointestinal:   Negative for abdominal pain, vomiting and diarrhea.  Genitourinary:   Negative for dysuria or difficulty urinating. Musculoskeletal:   Negative for focal pain or swelling Neurological:   Negative for headaches 10-point ROS otherwise negative.  ____________________________________________   PHYSICAL EXAM:  VITAL SIGNS: ED Triage Vitals  Enc Vitals Group     BP 05/31/16 1945 (!) 168/69     Pulse Rate 05/31/16 1947 61     Resp 05/31/16 1947 20     Temp 05/31/16 1947 98.6 F (37 C)     Temp Source 05/31/16 1947 Oral     SpO2 05/31/16 1947 99 %     Weight 05/31/16 1948 208 lb (94.3 kg)     Height 05/31/16 1948 5\' 8"  (1.727 m)     Head Circumference --      Peak Flow --      Pain Score 05/31/16 1948 1     Pain Loc --      Pain Edu? --      Excl. in [place]? --     Vital signs reviewed, nursing assessments reviewed.   Constitutional:   Alert and oriented. Well appearing and in no distress. Eyes:   No scleral icterus. No conjunctival pallor. PERRL. EOMI.  No nystagmus. ENT   Head:   Normocephalic With abrasion and contusion of the right forehead. Hemostatic. No laceration. No bony point tenderness.   Nose:   No  congestion/rhinnorhea. No septal hematoma   Mouth/Throat:   MMM, no pharyngeal erythema. No peritonsillar mass.    Neck:   No stridor. No SubQ emphysema. No meningismus. Hematological/Lymphatic/Immunilogical:   No cervical lymphadenopathy. Cardiovascular:   RRR. Symmetric bilateral radial and DP pulses.  No murmurs.  Respiratory:   Normal respiratory effort without tachypnea nor retractions. Breath sounds are clear and equal bilaterally. No wheezes/rales/rhonchi. Gastrointestinal:   Soft and nontender. Non distended. There is no CVA tenderness.  No rebound, rigidity, or guarding. Genitourinary:   deferred Musculoskeletal:   Nontender with normal range of motion in all extremities. No joint effusions.  No lower extremity tenderness.  No edema. Neurologic:   Normal speech and language.  CN 2-10 normal. Motor grossly intact. No gross focal neurologic deficits are appreciated.  Skin:    Skin is warm, dry with abrasions as noted above on the right forehead. Laceration over the right shin consisting of 2 segments with about 3 cm gaping wound inferiorly and 4 cm gaping wound superiorly and a total wound tract of about 10 cm.  ____________________________________________    LABS (pertinent positives/negatives) (all labs ordered are listed, but only abnormal results are displayed) Labs Reviewed - No data to display ____________________________________________   EKG    ____________________________________________    RADIOLOGY  CT head unremarkable X-ray right tibia-fibula unremarkable  ____________________________________________   PROCEDURES Procedures LACERATION REPAIR Performed by: Joni Fears, Primrose Oler Authorized by: Carrie Mew Consent: Verbal consent obtained. Risks and benefits: risks, benefits and alternatives were discussed Consent given by: patient Patient identity confirmed: provided demographic data Prepped and Draped in normal sterile fashion Wound  explored  Laceration Location: Right shin  Laceration Length: 7cm  No Foreign Bodies seen or palpated  Anesthesia: local infiltration  Local anesthetic: lidocaine 1% with epinephrine  Anesthetic total: 7 ml  Irrigation method: syringe Amount of cleaning: standard  Skin closure: 4-0 Monocryl   Number of sutures: 6  Technique: Simple interrupted  Patient tolerance: Patient tolerated the procedure well with no immediate complications.  ____________________________________________   INITIAL IMPRESSION / ASSESSMENT AND PLAN / ED COURSE  Pertinent labs & imaging results that were available during my care of the patient were reviewed by me and considered in my medical decision making (see chart for details).  Pt well appearing, NAD. Exam sig. For laceration of the RLE.  Wound care provided, laceration repaired.  No other significant injuries. No evidence of any kind of spinal injury or intracranial hemorrhage. No foreign body in the wound.     Clinical Course    ____________________________________________   FINAL CLINICAL IMPRESSION(S) / ED DIAGNOSES  Final diagnoses:  Contusion of face, initial encounter  Injury of head, initial encounter  Laceration of left lower extremity, initial encounter      New Prescriptions   No medications on file     Portions of this note were generated with dragon dictation software. Dictation errors may occur despite best attempts at proofreading.    Carrie Mew, MD 05/31/16 [phone]

## 2016-06-03 NOTE — Telephone Encounter (Signed)
Patient has been informed.

## 2016-06-03 NOTE — Telephone Encounter (Signed)
Please notify patient.

## 2016-07-13 ENCOUNTER — Telehealth: Payer: Self-pay | Admitting: Radiology

## 2016-07-13 ENCOUNTER — Other Ambulatory Visit: Payer: Self-pay | Admitting: Internal Medicine

## 2016-07-13 DIAGNOSIS — E559 Vitamin D deficiency, unspecified: Secondary | ICD-10-CM

## 2016-07-13 DIAGNOSIS — I10 Essential (primary) hypertension: Secondary | ICD-10-CM

## 2016-07-13 DIAGNOSIS — Z79899 Other long term (current) drug therapy: Secondary | ICD-10-CM

## 2016-07-13 DIAGNOSIS — E7800 Pure hypercholesterolemia, unspecified: Secondary | ICD-10-CM

## 2016-07-13 NOTE — Telephone Encounter (Signed)
Pt last refill for KLOR-CON was on 06/11/16. Pt last OV and labs were 04/14/16. Pt has scheduled appt for 09/07/16. Ok to refill or needs to have labs drawn?

## 2016-07-13 NOTE — Telephone Encounter (Signed)
Ok to refill.  Fasting labs ordered, needs priro to next appt

## 2016-07-13 NOTE — Telephone Encounter (Signed)
Could you schedule pt for labs prior to next appt?

## 2016-07-14 ENCOUNTER — Encounter: Payer: Self-pay | Admitting: *Deleted

## 2016-07-14 NOTE — Telephone Encounter (Signed)
Keep getting a busy signal on phone number. Sent out a letter on 07/14/16 to call office and make appt for labs and to also update phone number.

## 2016-07-17 ENCOUNTER — Other Ambulatory Visit: Payer: Self-pay | Admitting: Internal Medicine

## 2016-07-28 ENCOUNTER — Telehealth: Payer: Self-pay | Admitting: Internal Medicine

## 2016-07-28 NOTE — Telephone Encounter (Signed)
I called pt- he wanted to go through his entire med list and have me explain what each med was for and if he should continue them. I advised him to continue all the meds listed in chart and bring his meds with him to next OV to discuss further with PCP. Pt thanked me for calling and states he will confirm with Dr. Derrel Nip at his 09/07/16.

## 2016-07-28 NOTE — Telephone Encounter (Signed)
Pt lvm asking for Dr. Lupita Dawn CMA to call him back regarding his medication list. He states that he has a very extensive list and wants to know how accurate it is and what the reason is for each medication. Pt cb [phone].

## 2016-08-04 ENCOUNTER — Ambulatory Visit
Admission: RE | Admit: 2016-08-04 | Discharge: 2016-08-04 | Disposition: A | Discharge status: Home / Self Care | Payer: Medicare Other | Source: Ambulatory Visit | Attending: Urology | Admitting: Urology

## 2016-08-04 DIAGNOSIS — K449 Diaphragmatic hernia without obstruction or gangrene: Secondary | ICD-10-CM | POA: Insufficient documentation

## 2016-08-04 DIAGNOSIS — N2889 Other specified disorders of kidney and ureter: Secondary | ICD-10-CM | POA: Insufficient documentation

## 2016-08-04 DIAGNOSIS — K5750 Diverticulosis of both small and large intestine without perforation or abscess without bleeding: Secondary | ICD-10-CM | POA: Diagnosis not present

## 2016-08-04 DIAGNOSIS — N281 Cyst of kidney, acquired: Secondary | ICD-10-CM | POA: Diagnosis not present

## 2016-08-04 DIAGNOSIS — M47896 Other spondylosis, lumbar region: Secondary | ICD-10-CM | POA: Diagnosis not present

## 2016-08-04 DIAGNOSIS — K7689 Other specified diseases of liver: Secondary | ICD-10-CM | POA: Diagnosis not present

## 2016-08-04 DIAGNOSIS — I70208 Unspecified atherosclerosis of native arteries of extremities, other extremity: Secondary | ICD-10-CM | POA: Insufficient documentation

## 2016-08-04 DIAGNOSIS — M5136 Other intervertebral disc degeneration, lumbar region: Secondary | ICD-10-CM | POA: Diagnosis not present

## 2016-08-04 DIAGNOSIS — K5900 Constipation, unspecified: Secondary | ICD-10-CM | POA: Insufficient documentation

## 2016-08-04 LAB — POCT I-STAT CREATININE: CREATININE: 1 mg/dL (ref 0.61–1.24)

## 2016-08-04 MED ORDER — IOPAMIDOL (ISOVUE-370) INJECTION 76%
75.0000 mL | Freq: Once | INTRAVENOUS | Status: AC | PRN
  Administered 2016-08-04: 75 mL via INTRAVENOUS

## 2016-08-05 ENCOUNTER — Telehealth: Payer: Self-pay

## 2016-08-05 NOTE — Telephone Encounter (Signed)
No answer

## 2016-08-05 NOTE — Telephone Encounter (Signed)
-----   Message from Nori Riis, PA-C sent at 08/04/2016  2:40 PM EST ----- Please notify the patient that the area in the kidney is unchanged.  He can choose to have an MRI (if he doesn't have a pacemaker or metal in his body) which may give [place] a more definitive answer or continue to have CT scans every 6 months.

## 2016-08-08 NOTE — Telephone Encounter (Signed)
The MRI may give [place] a better look at the lesions and then he may not have to do any more imaging.

## 2016-08-08 NOTE — Telephone Encounter (Signed)
Spoke with pt in reference to CT results and MRI vs. CT in 14mo. Pt wanted to know the benefits of having a MRI now instead of waiting the 66mo. Please advise.

## 2016-08-10 ENCOUNTER — Telehealth: Payer: Self-pay | Admitting: Urology

## 2016-08-10 NOTE — Telephone Encounter (Signed)
Mr. Selz, I have received the ad concerning Prostate XR.  I suggest you do not waste your money.  It looks like they will charge you $50 a month automatically for this stuff.  It also makes me weary that they are giving free gifts and a money-back guarantee to try the medication.  While none of the ingredients would be harmful, they haven't really been shown benefit in clinical studies.  Hope this helps.

## 2016-08-11 NOTE — Telephone Encounter (Signed)
Spoke with pt wife in reference to letter brought my office and MRI. Wife stated that she would have pt return call.

## 2016-08-12 NOTE — Telephone Encounter (Signed)
LMOM

## 2016-08-15 NOTE — Telephone Encounter (Signed)
Pt came into office and Olivia Mackie read pt information. Pt voiced understanding.

## 2016-08-15 NOTE — Telephone Encounter (Signed)
LMOM

## 2016-08-23 ENCOUNTER — Telehealth: Payer: Self-pay | Admitting: Internal Medicine

## 2016-08-23 NOTE — Telephone Encounter (Signed)
Pt's screening results are in your yellow folder for your review.  LOV: 04/14/16 Has upcoming OV w/ you: 09/07/16

## 2016-08-23 NOTE — Telephone Encounter (Signed)
Pt dropped off screening results. He is wondering if he needs more test done. Paper work is up front in Dr. Lupita Dawn color folder.

## 2016-08-24 NOTE — Telephone Encounter (Signed)
Yes, he should have the screening.  It is a good deal,

## 2016-08-24 NOTE — Telephone Encounter (Signed)
Pt informed of below. Papers are upfront for pt to p/u.

## 2016-09-01 ENCOUNTER — Ambulatory Visit: Payer: Medicare Other

## 2016-09-07 ENCOUNTER — Ambulatory Visit: Payer: Medicare Other | Admitting: Internal Medicine

## 2016-09-15 DIAGNOSIS — Z0000 Encounter for general adult medical examination without abnormal findings: Secondary | ICD-10-CM | POA: Diagnosis not present

## 2016-09-17 ENCOUNTER — Other Ambulatory Visit: Payer: Self-pay | Admitting: Internal Medicine

## 2016-09-19 NOTE — Telephone Encounter (Signed)
Please advise refill, has an upcoming appt with you in two weeks. thanks

## 2016-10-06 ENCOUNTER — Ambulatory Visit (INDEPENDENT_AMBULATORY_CARE_PROVIDER_SITE_OTHER): Payer: Medicare Other | Admitting: Internal Medicine

## 2016-10-06 ENCOUNTER — Encounter: Payer: Self-pay | Admitting: Internal Medicine

## 2016-10-06 VITALS — BP 132/78 | HR 64 | Temp 98.2°F | Resp 16 | Wt 210.1 lb

## 2016-10-06 DIAGNOSIS — N281 Cyst of kidney, acquired: Secondary | ICD-10-CM

## 2016-10-06 DIAGNOSIS — I10 Essential (primary) hypertension: Secondary | ICD-10-CM

## 2016-10-06 DIAGNOSIS — Z9181 History of falling: Secondary | ICD-10-CM

## 2016-10-06 DIAGNOSIS — I739 Peripheral vascular disease, unspecified: Secondary | ICD-10-CM | POA: Diagnosis not present

## 2016-10-06 DIAGNOSIS — K760 Fatty (change of) liver, not elsewhere classified: Secondary | ICD-10-CM

## 2016-10-06 DIAGNOSIS — R7301 Impaired fasting glucose: Secondary | ICD-10-CM

## 2016-10-06 DIAGNOSIS — E871 Hypo-osmolality and hyponatremia: Secondary | ICD-10-CM

## 2016-10-06 DIAGNOSIS — D699 Hemorrhagic condition, unspecified: Secondary | ICD-10-CM

## 2016-10-06 DIAGNOSIS — E7800 Pure hypercholesterolemia, unspecified: Secondary | ICD-10-CM

## 2016-10-06 LAB — POCT GLYCOSYLATED HEMOGLOBIN (HGB A1C): HEMOGLOBIN A1C: 5.6

## 2016-10-06 LAB — COMPREHENSIVE METABOLIC PANEL
ALK PHOS: 98 U/L (ref 39–117)
ALT: 14 U/L (ref 0–53)
AST: 24 U/L (ref 0–37)
Albumin: 4.1 g/dL (ref 3.5–5.2)
BUN: 22 mg/dL (ref 6–23)
CHLORIDE: 102 meq/L (ref 96–112)
CO2: 30 mEq/L (ref 19–32)
Calcium: 9.3 mg/dL (ref 8.4–10.5)
Creatinine, Ser: 0.89 mg/dL (ref 0.40–1.50)
GFR: 85.87 mL/min (ref >60.00)
GLUCOSE: 101 mg/dL — AB (ref 70–99)
POTASSIUM: 4 meq/L (ref 3.5–5.1)
SODIUM: 139 meq/L (ref 135–145)
TOTAL PROTEIN: 6.7 g/dL (ref 6.0–8.3)
Total Bilirubin: 0.8 mg/dL (ref 0.2–1.2)

## 2016-10-06 LAB — MICROALBUMIN / CREATININE URINE RATIO
Creatinine,U: 147.1 mg/dL
MICROALB/CREAT RATIO: 1.5 mg/g (ref 0.0–30.0)
Microalb, Ur: 2.2 mg/dL — ABNORMAL HIGH (ref 0.0–1.9)

## 2016-10-06 LAB — LIPID PANEL
CHOL/HDL RATIO: 2
Cholesterol: 117 mg/dL (ref 0–200)
HDL: 52.5 mg/dL (ref >39.00)
LDL Cholesterol: 47 mg/dL (ref 0–99)
NONHDL: 64.86
TRIGLYCERIDES: 91 mg/dL (ref 0.0–149.0)
VLDL: 18.2 mg/dL (ref 0.0–40.0)

## 2016-10-06 LAB — LDL CHOLESTEROL, DIRECT: Direct LDL: 48 mg/dL

## 2016-10-06 NOTE — Progress Notes (Signed)
Subjective:  Patient ID: Christopher Jenkins, male    DOB: [birth year]  Age: 83 y.o. MRN st: [id number]  CC: The primary encounter diagnosis was Hepatic steatosis. Diagnoses of Hyponatremia, Pure hypercholesterolemia, Essential hypertension, Impaired fasting glucose, History of bad fall, Peripheral artery disease ([place]), Renal cyst, acquired, NAFLD (nonalcoholic fatty liver disease), and Tendency toward bleeding easily [facility]) were also pertinent to this visit.  HPI Christopher Jenkins presents for follow up on hypertension, hyperlipidemia, vascular dementia   Had two  falls,  Most serious one was a  a fall in December on concrete steps, that occurred at night on ae walkway that was dark .  He fell down a short flight of steps.  Had a forehead laceration and a 10 cm gash on his right shin that required 6 sutures to.close. Has not had any falls while playing  Tennis or pickle ball  Walking 2 miles every morning   Discussed referral to [facility] [place] for balance    Some early wakeups at 3 am ,  Feels refreshed. Wearing his cpap  all night   Seeing urology,  Several renal cysts  He is worried about wife Christopher Jenkins memory,  Has forgotten several appointments and her friends are noticing the same thing .  she is not exercising at all.    Outpatient Medications Prior to Visit  Medication Sig Dispense Refill  . atorvastatin (LIPITOR) 40 MG tablet TAKE 1 TABLET (40 MG TOTAL) BY MOUTH DAILY. 90 tablet 3  . Calcium Carbonate-Vitamin D (CALCIUM-VITAMIN D) 600-125 MG-UNIT TABS Take 1 tablet by mouth daily at 2 PM. Reported on 09/02/2015    . Cholecalciferol (VITAMIN D3 PO) Take 1 tablet by mouth daily. Reported on 09/02/2015    . clopidogrel (PLAVIX) 75 MG tablet TAKE 1 TABLET (75 MG TOTAL) BY MOUTH DAILY. 90 tablet 3  . DIOCTO 50 MG/5ML syrup PLACE 3 DROPS IN EACH EAR DAILY TO PREVENT OCCLUSION WITH WAX 100 mL 0  . finasteride (PROSCAR) 5 MG tablet Take 1 tablet (5 mg total) by mouth daily. 90 tablet 3  .  fish oil-omega-3 fatty acids 1000 MG capsule Take 1 g by mouth daily. Reported on 09/07/2015    . hydrochlorothiazide (HYDRODIURIL) 25 MG tablet TAKE 1 TABLET (25 MG TOTAL) BY MOUTH DAILY. 90 tablet 3  . KLOR-CON M20 20 MEQ tablet TAKE 1 TABLET (20 MEQ TOTAL) BY MOUTH DAILY. 30 tablet 3  . meloxicam (MOBIC) 7.5 MG tablet TAKE 1 TABLET (7.5 MG TOTAL) BY MOUTH DAILY. 30 tablet 5  . memantine (NAMENDA) 10 MG tablet TAKE 1 TABLET (10 MG TOTAL) BY MOUTH 2 (TWO) TIMES DAILY. 180 tablet 0  . Multiple Vitamins-Minerals (MULTIVITAMIN WITH MINERALS) tablet Take 1 tablet by mouth daily. At 1400    . niacin 250 MG tablet Take 250 mg by mouth daily. At 1400     No facility-administered medications prior to visit.     Review of Systems;  Patient denies headache, fevers, malaise, unintentional weight loss, skin rash, eye pain, sinus congestion and sinus pain, sore throat, dysphagia,  hemoptysis , cough, dyspnea, wheezing, chest pain, palpitations, orthopnea, edema, abdominal pain, nausea, melena, diarrhea, constipation, flank pain, dysuria, hematuria, urinary  Frequency, nocturia, numbness, tingling, seizures,  Focal weakness, Loss of consciousness,  Tremor, insomnia, depression, anxiety, and suicidal ideation.      Objective:  BP 132/78 (BP Location: Left Arm, Patient Position: Sitting, Cuff Size: Normal)   Pulse 64   Temp 98.2 F (36.8 C) (  Oral)   Resp 16   Wt 210 lb 2 oz (95.3 kg)   SpO2 94%   BMI 31.95 kg/m   BP Readings from Last 3 Encounters:  10/06/16 132/78  05/31/16 (!) 153/74  04/14/16 138/74    Wt Readings from Last 3 Encounters:  10/06/16 210 lb 2 oz (95.3 kg)  05/31/16 208 lb (94.3 kg)  04/14/16 206 lb 6.4 oz (93.6 kg)    General appearance: alert, cooperative and appears stated age Ears: normal TM's and external ear canals both ears Throat: lips, mucosa, and tongue normal; teeth and gums normal Neck: no adenopathy, no carotid bruit, supple, symmetrical, trachea midline and  thyroid not enlarged, symmetric, no tenderness/mass/nodules Back: symmetric, no curvature. ROM normal. No CVA tenderness. Lungs: clear to auscultation bilaterally Heart: regular rate and rhythm, S1, S2 normal, no murmur, click, rub or gallop Abdomen: soft, non-tender; bowel sounds normal; no masses,  no organomegaly Pulses: 2+ and symmetric Skin: Skin color, texture, turgor normal. No rashes or lesions Lymph nodes: Cervical, supraclavicular, and axillary nodes normal.  Lab Results  Component Value Date   HGBA1C 5.6 10/06/2016    Lab Results  Component Value Date   CREATININE 0.89 10/06/2016   CREATININE 1.00 08/04/2016   CREATININE 0.91 04/14/2016    Lab Results  Component Value Date   WBC 5.5 04/14/2016   HGB 14.1 04/14/2016   HCT 41.7 04/14/2016   PLT 192.0 04/14/2016   GLUCOSE 101 (H) 10/06/2016   CHOL 117 10/06/2016   TRIG 91.0 10/06/2016   HDL 52.50 10/06/2016   LDLDIRECT 48.0 10/06/2016   LDLCALC 47 10/06/2016   ALT 14 10/06/2016   AST 24 10/06/2016   NA 139 10/06/2016   K 4.0 10/06/2016   CL 102 10/06/2016   CREATININE 0.89 10/06/2016   BUN 22 10/06/2016   CO2 30 10/06/2016   TSH 1.19 03/09/2016   INR 1.1 (H) 04/14/2016   HGBA1C 5.6 10/06/2016   MICROALBUR 2.2 (H) 10/06/2016    Ct Abdomen W Wo Contrast  Result Date: 08/04/2016 CLINICAL DATA:  Bosniak category 2 F cyst of the left kidney lower pole. EXAM: CT ABDOMEN WITHOUT AND WITH CONTRAST TECHNIQUE: Multidetector CT imaging of the abdomen was performed following the standard protocol before and following the bolus administration of intravenous contrast. CONTRAST:  75 cc Isovue 370 COMPARISON:  Multiple exams, including 08/05/2015 FINDINGS: Lower chest: Small type 1 hiatal hernia. Hepatobiliary: Several small hepatic lesions are not appreciably changed from 2013 and considered benign, likely cysts. Gallbladder unremarkable. Pancreas: Unremarkable Spleen: Unremarkable Adrenals/Urinary Tract: Bosniak category 2  faintly septated cyst of the right kidney upper pole, 8.2 by 7.1 cm. Bosniak category 1 cyst of the left kidney upper pole, 6.7 by 6.6 cm on image 52/11. 8 mm cystic lesion of the left mid kidney, has increased from 7 mm in diameter in 2013, likely benign. The left kidney lower pole lesion measures 5.2 by 4.3 cm, and does not appear to enhance. However, on the precontrast images there is a 1.2 by 1.0 cm hyperdense lesion along its upper lateral margin (images 71-72 series 2) similar to 08/05/2015. Because of the small size of this lesion and its geometry, density measurements are problematic and it is difficult to exclude enhancement although this might simply be a complex or hemorrhagic cysts. This lesion is not appreciably changed from 05/08/2015. 2 mm nonobstructive right mid kidney calculus. 3 mm nonobstructive left mid kidney calculus. Stomach/Bowel: Transverse duodenum diverticulum. Prominent stool throughout the colon favors constipation.  Descending colon diverticulosis. Vascular/Lymphatic: Aortoiliac atherosclerotic vascular disease. No pathologic adenopathy in the upper abdomen. Other: No supplemental non-categorized findings. Musculoskeletal: Lower thoracic and lumbar spondylosis and degenerative disc disease causing multilevel impingement most notable at L3-4 and L4-5. Grade 1 retrolisthesis at L2- 3 and grade 1 anterolisthesis at L4-5. IMPRESSION: 1. Bilateral renal cysts are relatively similar to the prior exam. In particular, the left kidney lower pole cyst which has a small hyperdense marginal component is unchanged. Because this hyperdense component is so small, it is difficult to measure for enhancement. If clinically warranted, renal protocol MRI with and without contrast could be utilized and may further be able to determine whether this lesion is enhancing. Barring that, I would continue to classify this as Bosniak category 2 F (likely benign, but requiring follow up) an would suggest a follow up  renal protocol CT exam in 6 months time. 2. Other imaging findings of potential clinical significance: Small type 1 hiatal hernia. Several small hepatic cysts. Transverse duodenal diverticulum. Prominent stool throughout the colon favors constipation. Descending colon diverticulosis. Aortoiliac atherosclerotic vascular disease. Spondylosis and degenerative disc disease in the lumbar spine causing multilevel impingement. Electronically Signed   By: Van Clines M.D.   On: 08/04/2016 09:58    Assessment & Plan:   Problem List Items Addressed This Visit    History of bad fall    Referral to [facility]       Relevant Orders   Ambulatory referral to Physical Therapy   Hyperlipidemia    Continued use of statin is advised given atherosclerosis of aorta noted on prior CT .  He is tolerating atorvastatin and LFTs are normal.   Lab Results  Component Value Date   CHOL 117 10/06/2016   HDL 52.50 10/06/2016   LDLCALC 47 10/06/2016   LDLDIRECT 48.0 10/06/2016   TRIG 91.0 10/06/2016   CHOLHDL 2 10/06/2016   Lab Results  Component Value Date   ALT 14 10/06/2016   AST 24 10/06/2016   ALKPHOS 98 10/06/2016   BILITOT 0.8 10/06/2016         Relevant Orders   LDL cholesterol, direct (Completed)   Lipid panel (Completed)   POCT HgB A1C (Completed)   Hypertension    Well controlled on current regimen. Renal function stable, no changes today.  Lab Results  Component Value Date   CREATININE 0.89 10/06/2016   Lab Results  Component Value Date   NA 139 10/06/2016   K 4.0 10/06/2016   CL 102 10/06/2016   CO2 30 10/06/2016         Relevant Orders   Microalbumin / creatinine urine ratio (Completed)   POCT HgB A1C (Completed)   Hyponatremia   Relevant Orders   Comprehensive metabolic panel (Completed)   Impaired fasting glucose    No evidence of diabetes by A1c.    Lab Results  Component Value Date   HGBA1C 5.6 10/06/2016         NAFLD (nonalcoholic  fatty liver disease) - Primary    Incidental finding on CT,  Normal transaminases, mild elevation of alk phos noted with normal GB,  No ductal dilitation on CT.  Continue to work on achieving weight loss  ,  And continue statin . Hep A/B vaccines recommended.   Lab Results  Component Value Date   ALT 14 10/06/2016   AST 24 10/06/2016   ALKPHOS 98 10/06/2016   BILITOT 0.8 10/06/2016         Peripheral  artery disease ([place])    He has mild carotid disease and aortic atherosclerosis by prior [facility] screenings and abdominal CT. Continue daily asa, plavix and atorvastatin       Renal cyst, acquired    Managed by Urology with annual surveillance       Tendency toward bleeding easily ([place])    Secondary to plavix and aspirin          I am having Mr. Heiser maintain his niacin, fish oil-omega-3 fatty acids, Cholecalciferol (VITAMIN D3 PO), multivitamin with minerals, Calcium-Vitamin D, atorvastatin, hydrochlorothiazide, clopidogrel, meloxicam, finasteride, KLOR-CON M20, memantine, and DIOCTO.  No orders of the defined types were placed in this encounter.   There are no discontinued medications.  Follow-up: Return in about 6 months (around 04/07/2017) for cpe.   [name], MD

## 2016-10-06 NOTE — Progress Notes (Signed)
Pre visit review using our clinic review tool, if applicable. No additional management support is needed unless otherwise documented below in the visit note. 

## 2016-10-06 NOTE — Patient Instructions (Signed)
I have ordered Physical therapy for you  Juliann Pulse will call [name] to set up an appointment

## 2016-10-08 DIAGNOSIS — R7301 Impaired fasting glucose: Secondary | ICD-10-CM | POA: Insufficient documentation

## 2016-10-08 DIAGNOSIS — Z9181 History of falling: Secondary | ICD-10-CM | POA: Insufficient documentation

## 2016-10-08 NOTE — Assessment & Plan Note (Signed)
No evidence of diabetes by A1c.    Lab Results  Component Value Date   HGBA1C 5.6 10/06/2016

## 2016-10-08 NOTE — Assessment & Plan Note (Signed)
Incidental finding on CT,  Normal transaminases, mild elevation of alk phos noted with normal GB,  No ductal dilitation on CT.  Continue to work on achieving weight loss  ,  And continue statin . Hep A/B vaccines recommended.   Lab Results  Component Value Date   ALT 14 10/06/2016   AST 24 10/06/2016   ALKPHOS 98 10/06/2016   BILITOT 0.8 10/06/2016

## 2016-10-08 NOTE — Assessment & Plan Note (Signed)
Well controlled on current regimen. Renal function stable, no changes today.  Lab Results  Component Value Date   CREATININE 0.89 10/06/2016   Lab Results  Component Value Date   NA 139 10/06/2016   K 4.0 10/06/2016   CL 102 10/06/2016   CO2 30 10/06/2016

## 2016-10-08 NOTE — Assessment & Plan Note (Signed)
Managed by Urology with annual surveillance

## 2016-10-08 NOTE — Assessment & Plan Note (Addendum)
He has mild carotid disease and aortic atherosclerosis by prior [facility] screenings and abdominal CT. Continue daily asa, plavix and atorvastatin

## 2016-10-08 NOTE — Assessment & Plan Note (Signed)
Secondary to plavix and aspirin

## 2016-10-08 NOTE — Assessment & Plan Note (Signed)
Continued use of statin is advised given atherosclerosis of aorta noted on prior CT .  He is tolerating atorvastatin and LFTs are normal.   Lab Results  Component Value Date   CHOL 117 10/06/2016   HDL 52.50 10/06/2016   LDLCALC 47 10/06/2016   LDLDIRECT 48.0 10/06/2016   TRIG 91.0 10/06/2016   CHOLHDL 2 10/06/2016   Lab Results  Component Value Date   ALT 14 10/06/2016   AST 24 10/06/2016   ALKPHOS 98 10/06/2016   BILITOT 0.8 10/06/2016

## 2016-10-08 NOTE — Assessment & Plan Note (Signed)
Referral to [facility] Therapy

## 2016-10-10 ENCOUNTER — Telehealth: Payer: Self-pay

## 2016-10-10 DIAGNOSIS — E7800 Pure hypercholesterolemia, unspecified: Secondary | ICD-10-CM

## 2016-10-10 DIAGNOSIS — I10 Essential (primary) hypertension: Secondary | ICD-10-CM

## 2016-10-10 DIAGNOSIS — R5383 Other fatigue: Secondary | ICD-10-CM

## 2016-10-10 NOTE — Telephone Encounter (Signed)
Spoke with pt's wife and informed her of the pt's lab results. Let her know that the pt would need to repeat labs in 6 months. She stated that the pt would call back to schedule. The labs have been ordered.

## 2016-10-10 NOTE — Telephone Encounter (Signed)
-----   Message from [name], MD sent at 10/09/2016  8:53 AM EDT ----- Your cholesterol, liver and kidney function are normal.  You do not need any medication changes. Please plan to repeat the labs in 6 months.    Regards,   Dr. Derrel Nip

## 2016-10-11 DIAGNOSIS — R2681 Unsteadiness on feet: Secondary | ICD-10-CM | POA: Diagnosis not present

## 2016-10-12 DIAGNOSIS — R2681 Unsteadiness on feet: Secondary | ICD-10-CM | POA: Diagnosis not present

## 2016-10-21 DIAGNOSIS — M25511 Pain in right shoulder: Secondary | ICD-10-CM | POA: Diagnosis not present

## 2016-10-24 DIAGNOSIS — M25511 Pain in right shoulder: Secondary | ICD-10-CM | POA: Diagnosis not present

## 2016-11-05 ENCOUNTER — Other Ambulatory Visit: Payer: Self-pay | Admitting: Internal Medicine

## 2016-11-06 ENCOUNTER — Other Ambulatory Visit: Payer: Self-pay | Admitting: Internal Medicine

## 2016-12-09 ENCOUNTER — Telehealth: Payer: Self-pay | Admitting: Internal Medicine

## 2016-12-09 NOTE — Telephone Encounter (Signed)
LMTCB

## 2016-12-09 NOTE — Telephone Encounter (Signed)
Pt lvm asking to speak to Dr. Lupita Dawn nurse. He states that he needs to discuss his medications. Some are about to run out and wants to know if he needs to get them refilled or no longer take them.  Pt cb [phone]

## 2016-12-12 NOTE — Telephone Encounter (Signed)
Pt lvm for Christopher Jenkins to return his call.

## 2016-12-13 NOTE — Telephone Encounter (Signed)
Spoke with pt and went through his medications he had at home to let him know that he needed to be taking.

## 2016-12-17 ENCOUNTER — Other Ambulatory Visit: Payer: Self-pay | Admitting: Internal Medicine

## 2017-01-09 ENCOUNTER — Encounter: Payer: Self-pay | Admitting: Podiatry

## 2017-01-09 ENCOUNTER — Ambulatory Visit (INDEPENDENT_AMBULATORY_CARE_PROVIDER_SITE_OTHER): Payer: Medicare Other | Admitting: Podiatry

## 2017-01-09 ENCOUNTER — Ambulatory Visit: Payer: Medicare Other | Admitting: Podiatry

## 2017-01-09 DIAGNOSIS — L03032 Cellulitis of left toe: Secondary | ICD-10-CM

## 2017-01-09 DIAGNOSIS — L600 Ingrowing nail: Secondary | ICD-10-CM

## 2017-01-09 NOTE — Progress Notes (Signed)
This patient presents the office with chief complaint of pain on the outside border the big toe of the left foot. He says that the nail is growing into the skin at the tip of the toe and it is bleeding and hurting.  He states this been going on for approximately a week and he has sought no professional treatment for this problem.  He presents the office today for an evaluation and treatment of this painful ingrowing toenail   GENERAL APPEARANCE: Alert, conversant. Appropriately groomed. No acute distress.  VASCULAR: Pedal pulses are  palpable at  [facility] and PT bilateral.  Capillary refill time is immediate to all digits,  Normal temperature gradient.  Digital hair growth is present bilateral  NEUROLOGIC: sensation is normal to 5.07 monofilament at 5/5 sites bilateral.  Light touch is intact bilateral, Muscle strength normal.  MUSCULOSKELETAL: acceptable muscle strength, tone and stability bilateral.  Intrinsic muscluature intact bilateral.  Rectus appearance of foot and digits noted bilateral.  NAILS  long, ingrowing toenail on the distal lateral aspect of the lateral border left great toe DERMATOLOGIC: skin color, texture, and turgor are within normal limits.  No preulcerative lesions or ulcers  are seen, no interdigital maceration noted.  No open lesions present.   No drainage noted.  Paronychia lateral border left great toe.  Incision and drainage of the lateral border of the left great toe.  Patient was told to soak his toe at night.  Return to clinic when necessary   Gardiner Barefoot DPM

## 2017-01-26 ENCOUNTER — Telehealth: Payer: Self-pay | Admitting: Internal Medicine

## 2017-01-26 NOTE — Telephone Encounter (Signed)
Pt would like me to call back and schedule AWV

## 2017-02-04 ENCOUNTER — Other Ambulatory Visit: Payer: Self-pay | Admitting: Internal Medicine

## 2017-02-22 ENCOUNTER — Ambulatory Visit (INDEPENDENT_AMBULATORY_CARE_PROVIDER_SITE_OTHER): Payer: Medicare Other

## 2017-02-22 VITALS — BP 130/70 | HR 63 | Temp 98.5°F | Resp 16 | Ht 68.0 in | Wt 214.4 lb

## 2017-02-22 DIAGNOSIS — Z0000 Encounter for general adult medical examination without abnormal findings: Secondary | ICD-10-CM

## 2017-02-22 NOTE — Progress Notes (Signed)
Subjective:   Christopher Jenkins is a 83 y.o. male who presents for Medicare Annual/Subsequent preventive examination.  Review of Systems:  No ROS.  Medicare Wellness Visit. Additional risk factors are reflected in the social history.  Cardiac Risk Factors include: advanced age (>39men, >49 women);male gender;hypertension     Objective:    Vitals: BP 130/70 (BP Location: Right Arm, Patient Position: Sitting, Cuff Size: Normal)   Pulse 63   Temp 98.5 F (36.9 C) (Oral)   Resp 16   Ht 5\' 8"  (1.727 m)   Wt 214 lb 6.4 oz (97.3 kg)   SpO2 94%   BMI 32.60 kg/m   Body mass index is 32.6 kg/m.  Tobacco History  Smoking Status  . Never Smoker  Smokeless Tobacco  . Never Used     Counseling given: Not Answered   Past Medical History:  Diagnosis Date  . BPH (benign prostatic hyperplasia)   . Duodenal mass   . Elevated PSA   . Gross hematuria   . Hyperlipidemia   . Hypertension   . Hypertrophy (benign) of prostate    asymptomatic on finasteride. will f/up with urologist [name]  . Kidney lesion 07/2015   pt has appt at kidney specialist 08/05/15 to review CT scan.  . Obesity   . Overweight   . Tubular adenoma of colon   . Urinary retention    Past Surgical History:  Procedure Laterality Date  . APPENDECTOMY    . cataracts    . TOTAL HIP ARTHROPLASTY Right 08/13/2015   Procedure: TOTAL HIP ARTHROPLASTY;  Surgeon: [name], MD;  Location: [facility] ORS;  Service: Orthopedics;  Laterality: Right;  . Uvula Surgery  1990   Family History  Problem Relation Age of Onset  . Heart disease Mother   . Stroke Mother   . Heart disease Father   . Kidney disease Neg Hx   . Prostate cancer Neg Hx    History  Sexual Activity  . Sexual activity: No    Outpatient Encounter Prescriptions as of 02/22/2017  Medication Sig  . atorvastatin (LIPITOR) 40 MG tablet TAKE 1 TABLET (40 MG TOTAL) BY MOUTH DAILY.  . Calcium Carbonate-Vitamin D (CALCIUM-VITAMIN D) 600-125 MG-UNIT  TABS Take 1 tablet by mouth daily at 2 PM. Reported on 09/02/2015  . clopidogrel (PLAVIX) 75 MG tablet TAKE 1 TABLET (75 MG TOTAL) BY MOUTH DAILY.  Marland Kitchen DIOCTO 50 MG/5ML syrup PLACE 3 DROPS IN EACH EAR DAILY TO PREVENT OCCLUSION WITH WAX  . finasteride (PROSCAR) 5 MG tablet Take 1 tablet (5 mg total) by mouth daily.  . fish oil-omega-3 fatty acids 1000 MG capsule Take 1 g by mouth daily. Reported on 09/07/2015  . hydrochlorothiazide (HYDRODIURIL) 25 MG tablet TAKE 1 TABLET (25 MG TOTAL) BY MOUTH DAILY.  Marland Kitchen KLOR-CON M20 20 MEQ tablet TAKE 1 TABLET (20 MEQ TOTAL) BY MOUTH DAILY.  . memantine (NAMENDA) 10 MG tablet TAKE 1 TABLET (10 MG TOTAL) BY MOUTH 2 (TWO) TIMES DAILY.  . Multiple Vitamins-Minerals (MULTIVITAMIN WITH MINERALS) tablet Take 1 tablet by mouth daily. At 1400  . niacin 250 MG tablet Take 250 mg by mouth daily. At 1400   No facility-administered encounter medications on file as of 02/22/2017.     Activities of Daily Living In your present state of health, do you have any difficulty performing the following activities: 02/22/2017  Hearing? N  Vision? N  Difficulty concentrating or making decisions? Y  Comment Difficulty remembering names  Walking or  climbing stairs? N  Dressing or bathing? N  Doing errands, shopping? N  Preparing Food and eating ? N  Using the Toilet? N  In the past six months, have you accidently leaked urine? N  Do you have problems with loss of bowel control? N  Managing your Medications? N  Managing your Finances? N  Housekeeping or managing your Housekeeping? Y  Comment Staff assists x2 month  Some recent data might be hidden    Patient Care Team: [name], MD as PCP - General (Internal Medicine)   Assessment:    This is a routine wellness examination for Christopher Jenkins. The goal of the wellness visit is to assist the patient how to close the gaps in care and create a preventative care plan for the patient.   The roster of all physicians providing  medical care to patient is listed in the Snapshot section of the chart.  Taking calcium VIT D as appropriate/Osteoporosis risk reviewed.    Safety issues reviewed; Smoke and carbon monoxide detectors in the home. No firearms in the home.  Wears seatbelts when driving or riding with others. Patient does wear sunscreen or protective clothing when in direct sunlight. No violence in the home.  Depression- PHQ 2 &9 complete.  No signs/symptoms or verbal communication regarding little pleasure in doing things, feeling down, depressed or hopeless. No changes in sleeping, energy, eating, concentrating.  No thoughts of self harm or harm towards others.  Time spent on this topic is 8 minutes.   Patient is alert, normal appearance, oriented to person/place/and time.  Correctly identified the president of the [place], recall of 3/3 words, and performing simple calculations. Displays appropriate judgement and can read correct time from watch face.   No new identified risk were noted.  No failures at ADL's or IADL's.    BMI- discussed the importance of a healthy diet, water intake and the benefits of aerobic exercise. Educational material provided.   24 hour diet recall: Breakfast: Cereal Lunch: Protein drink Dinner: Lobster roll  Daily fluid intake: 0  cups of caffeine, 5 cups of water  Dental- [employer] [facility].  Eye- Visual acuity not assessed per patient preference since they have regular follow up with the ophthalmologist.  Wears corrective lenses.  Sleep patterns- Sleeps 6-8 hours at night.  Wakes feeling rested. CPAP in use.  Naps as needed.  Influenza vaccine; he will get it at [facility], notify when completed.  Patient Concerns: None at this time. Follow up with PCP as needed.  Exercise Activities and Dietary recommendations Current Exercise Habits: Home exercise routine, Type of exercise: walking (Tennis), Time (Minutes): 15, Frequency (Times/Week): 2, Weekly Exercise (Minutes/Week):  30, Intensity: Moderate  Goals    . Healthy Lifestyle          Stay active.  Practice balance exercises. Stay hydrated!  Drink plenty of water. Low carb foods.  Lean meats, fruits and vegetables.      Fall Risk Fall Risk  02/22/2017 10/06/2016 09/02/2015 07/06/2012 06/06/2012  Falls in the past year? Yes Yes No No No  Number falls in past yr: 2 or more 1 - - -  Injury with Fall? Yes No - - -  Comment Fell down stairs at [employer].  Sought medical attention. Tripped over his feet playing tennis. - - - -  Follow up Falls prevention discussed;Education provided - - - -   Depression Screen PHQ 2/9 Scores 02/22/2017 10/06/2016 09/02/2015 06/06/2012  PHQ - 2 Score 0 0 0  0  PHQ- 9 Score 0 - - -    Cognitive Function MMSE - Mini Mental State Exam 02/22/2017 09/02/2015  Orientation to time 5 5  Orientation to Place 5 5  Registration 3 3  Attention/ Calculation 5 5  Recall 3 3  Language- name 2 objects 2 2  Language- repeat 1 1  Language- follow 3 step command 3 3  Language- read & follow direction 1 1  Write a sentence 1 1  Copy design 1 1  Total score 30 30        Immunization History  Administered Date(s) Administered  . Hep A / Hep B 03/17/2016  . Influenza Split 04/03/2012, 03/12/2013  . Influenza,inj,Quad PF,6+ Mos 02/24/2015  . Influenza-Unspecified 03/29/2014  . Pneumococcal Conjugate-13 05/07/2014  . Pneumococcal Polysaccharide-23 03/26/2013  . Tdap 07/29/2009, 05/31/2016  . Zoster 04/26/2013   Screening Tests Health Maintenance  Topic Date Due  . INFLUENZA VACCINE  01/18/2017  . TETANUS/TDAP  05/31/2026  . PNA vac Low Risk Adult  Completed      Plan:    End of life planning; Advance aging; Advanced directives discussed. Copy of current HCPOA/Living Will on file.    I have personally reviewed and noted the following in the patient's chart:   . Medical and social history . Use of alcohol, tobacco or illicit drugs  . Current medications and  supplements . Functional ability and status . Nutritional status . Physical activity . Advanced directives . List of other physicians . Hospitalizations, surgeries, and ER visits in previous 12 months . Vitals . Screenings to include cognitive, depression, and falls . Referrals and appointments  In addition, I have reviewed and discussed with patient certain preventive protocols, quality metrics, and best practice recommendations. A written personalized care plan for preventive services as well as general preventive health recommendations were provided to patient.     OBrien-Blaney, Chadley Dziedzic L, LPN  02/23/2840   I have reviewed the above information and agree with above.   Deborra Medina, MD

## 2017-02-22 NOTE — Patient Instructions (Addendum)
  Mr. Christopher Jenkins , Thank you for taking time to come for your Medicare Wellness Visit. I appreciate your ongoing commitment to your health goals. Please review the following plan we discussed and let me know if I can assist you in the future.   Follow up with Dr. Derrel Nip as needed.    Make a fasting lab appointment 1-2 weeks prior to upcoming visit in October.  Have a great day!  These are the goals we discussed: Goals    . Healthy Lifestyle          Stay active.  Practice balance exercises. Stay hydrated!  Drink plenty of water. Low carb foods.  Lean meats, fruits and vegetables.       This is a list of the screening recommended for you and due dates:  Health Maintenance  Topic Date Due  . Flu Shot  01/18/2017  . Tetanus Vaccine  05/31/2026  . Pneumonia vaccines  Completed

## 2017-03-01 NOTE — Telephone Encounter (Signed)
Completed 02/22/17

## 2017-03-02 ENCOUNTER — Other Ambulatory Visit: Payer: Self-pay | Admitting: Internal Medicine

## 2017-03-05 ENCOUNTER — Other Ambulatory Visit: Payer: Self-pay | Admitting: Internal Medicine

## 2017-03-07 ENCOUNTER — Other Ambulatory Visit: Payer: Self-pay | Admitting: Internal Medicine

## 2017-03-07 NOTE — Progress Notes (Signed)
10:48 AM   Christopher Jenkins 07-16-1926 [id number]  Referring provider: [name], MD [place] [place], [place] [address]  Chief Complaint  Patient presents with  . Benign Prostatic Hypertrophy    1 year follow up    HPI: Patient is an 83 year old Caucasian male who presents for a yearly follow up for a history of hematuria, a Bosniak 16F cyst and BPH with LUTS.    History of hematuria He has not had any further episodes of gross hematuria since his last visit with [place].  He has underwent CT Urograms in 2017 and 07/2016.  He was found to have a Bosniak 16F on the CT in 07/2015 and it remained unchanged on 07/2016 CTU.  Cystoscopy was negative for malignancies.  He has not had any gross hematuria.  Bosniak 16F cyst CT Urogram completed on 08/05/2015 noted a Bosniak category 16F lesion arising from the inferior pole of the left kidney is again noted and is unchanged on 07/2016 exam measuring 11 mm.  Patient did not remember having this lesion or the imaging studies in the past.    BPH WITH LUTS His IPSS score today is 1, which is mild lower urinary tract symptomatology. He is delighted with his quality life due to his urinary symptoms.  His previous I PSS score was 1/1.  His major complaint today is nocturia x 1.  He has had these symptoms for several years.  He denies any dysuria, hematuria or suprapubic pain.  He currently taking finasteride 5 mg daily.  He also denies any recent fevers, chills, nausea or vomiting.  He does not have a family history of PCa.      IPSS    Row Name 03/09/17 1000         International Prostate Symptom Score   How often have you had the sensation of not emptying your bladder? Not at All     How often have you had to urinate less than every two hours? Not at All     How often have you found you stopped and started again several times when you urinated? Not at All     How often have you found it difficult to postpone urination? Not  at All     How often have you had a weak urinary stream? Not at All     How often have you had to strain to start urination? Not at All     How many times did you typically get up at night to urinate? 1 Time     Total IPSS Score 1       Quality of Life due to urinary symptoms   If you were to spend the rest of your life with your urinary condition just the way it is now how would you feel about that? Delighted        Score:  1-7 Mild 8-19 Moderate 20-35 Severe   PMH: Past Medical History:  Diagnosis Date  . BPH (benign prostatic hyperplasia)   . Duodenal mass   . Elevated PSA   . Gross hematuria   . Hyperlipidemia   . Hypertension   . Hypertrophy (benign) of prostate    asymptomatic on finasteride. will f/up with urologist [name]  . Kidney lesion 07/2015   pt has appt at kidney specialist 08/05/15 to review CT scan.  . Obesity   . Overweight   . Tubular adenoma of colon   . Urinary  retention     Surgical History: Past Surgical History:  Procedure Laterality Date  . APPENDECTOMY    . cataracts    . right knee surgery Right 12-17/2017  . TOTAL HIP ARTHROPLASTY Right 08/13/2015   Procedure: TOTAL HIP ARTHROPLASTY;  Surgeon: [name], MD;  Location: [facility] ORS;  Service: Orthopedics;  Laterality: Right;  . Uvula Surgery  1990    Home Medications:  Allergies as of 03/09/2017      Reactions   No Known Allergies       Medication List       Accurate as of 03/09/17 10:48 AM. Always use your most recent med list.          amoxicillin 875 MG tablet Commonly known as:  AMOXIL amoxicillin 875 mg tablet   aspirin 325 MG tablet aspirin 325 mg tablet,delayed release  TAKE 1 TABLET (325 MG TOTAL) BY MOUTH DAILY.   atorvastatin 40 MG tablet Commonly known as:  LIPITOR TAKE 1 TABLET (40 MG TOTAL) BY MOUTH DAILY.   BYSTOLIC 2.5 MG tablet Generic drug:  nebivolol Bystolic 2.5 mg tablet   Calcium-Vitamin D 600-125 MG-UNIT Tabs Take 1 tablet by mouth daily  at 2 PM. Reported on 09/02/2015   ciprofloxacin 250 MG tablet Commonly known as:  CIPRO ciprofloxacin 250 mg tablet   clopidogrel 75 MG tablet Commonly known as:  PLAVIX TAKE 1 TABLET (75 MG TOTAL) BY MOUTH DAILY.   docusate sodium 50 MG capsule Commonly known as:  COLACE docusate sodium 50 mg capsule  Take 1 capsule every day by oral route.   enoxaparin 40 MG/0.4ML injection Commonly known as:  LOVENOX enoxaparin 40 mg/0.4 mL subcutaneous syringe  INJECT SQ EVERY DAY FOR 14 DAYS   finasteride 5 MG tablet Commonly known as:  PROSCAR Take 1 tablet (5 mg total) by mouth daily.   Fish Oil 1000 MG Caps Fish Oil   fish oil-omega-3 fatty acids 1000 MG capsule Take 1 g by mouth daily. Reported on 09/07/2015   hydrochlorothiazide 25 MG tablet Commonly known as:  HYDRODIURIL TAKE 1 TABLET (25 MG TOTAL) BY MOUTH DAILY.   KLOR-CON M20 20 MEQ tablet Generic drug:  potassium chloride SA TAKE 1 TABLET (20 MEQ TOTAL) BY MOUTH DAILY.   meloxicam 7.5 MG tablet Commonly known as:  MOBIC meloxicam 7.5 mg tablet   memantine 10 MG tablet Commonly known as:  NAMENDA TAKE 1 TABLET (10 MG TOTAL) BY MOUTH 2 (TWO) TIMES DAILY.   multivitamin with minerals tablet Take 1 tablet by mouth daily. At 1400   niacin 250 MG tablet Take 250 mg by mouth daily. At 1400   predniSONE 10 MG (21) Tbpk tablet Commonly known as:  STERAPRED UNI-PAK 21 TAB prednisone 10 mg tablets in a dose pack   traMADol 50 MG tablet Commonly known as:  ULTRAM tramadol 50 mg tablet   VITAMIN D3 PO Vitamin D3            Discharge Care Instructions        Start     Ordered   03/09/17 0000  US RENAL    Question Answer Comment  Reason for Exam (SYMPTOM  OR DIAGNOSIS REQUIRED) left Bosniak 2 F cyst   Preferred imaging location? [facility]      03/09/17 1047      Allergies:  Allergies  Allergen Reactions  . No Known Allergies     Family History: Family History  Problem Relation Age of Onset   . Heart disease  Mother   . Stroke Mother   . Heart disease Father   . Kidney disease Neg Hx   . Prostate cancer Neg Hx   . Kidney cancer Neg Hx   . Bladder Cancer Neg Hx     Social History:  reports that he has never smoked. He has never used smokeless tobacco. He reports that he does not drink alcohol or use drugs.  ROS: UROLOGY Frequent Urination?: No Hard to postpone urination?: No Burning/pain with urination?: No Get up at night to urinate?: Yes Leakage of urine?: No Urine stream starts and stops?: No Trouble starting stream?: No Do you have to strain to urinate?: No Blood in urine?: No Urinary tract infection?: No Sexually transmitted disease?: No Injury to kidneys or bladder?: No Painful intercourse?: No Weak stream?: No Erection problems?: No Penile pain?: No  Gastrointestinal Nausea?: No Vomiting?: No Indigestion/heartburn?: No Diarrhea?: No Constipation?: No  Constitutional Fever: No Night sweats?: No Weight loss?: No Fatigue?: No  Skin Skin rash/lesions?: No Itching?: No  Eyes Blurred vision?: No Double vision?: No  Ears/Nose/Throat Sore throat?: No Sinus problems?: No  Hematologic/Lymphatic Swollen glands?: No Easy bruising?: No  Cardiovascular Leg swelling?: No Chest pain?: No  Respiratory Cough?: No Shortness of breath?: No  Endocrine Excessive thirst?: No  Musculoskeletal Back pain?: No Joint pain?: No  Neurological Headaches?: No Dizziness?: No  Psychologic Depression?: No Anxiety?: No  Physical Exam: BP 125/69   Pulse 71   Ht 5\' 8"  (1.727 m)   Wt 214 lb 8 oz (97.3 kg)   BMI 32.61 kg/m   GU: No CVA tenderness.  No bladder fullness or masses.  Patient with uncircumcised phallus. Foreskin easily retracted*  Urethral meatus is patent.  No penile discharge. No penile lesions or rashes. Scrotum without lesions, cysts, rashes and/or edema.  Testicles are located scrotally bilaterally. No masses are appreciated in the  testicles. Left and right epididymis are normal. Rectal: Patient with  normal sphincter tone. Anus and perineum without scarring or rashes. No rectal masses are appreciated. Prostate is severely enlarged and the entire gland could not be palpated due to its size.    Laboratory Data: Lab Results  Component Value Date   WBC 5.5 04/14/2016   HGB 14.1 04/14/2016   HCT 41.7 04/14/2016   MCV 93.8 04/14/2016   PLT 192.0 04/14/2016    Lab Results  Component Value Date   CREATININE 0.89 10/06/2016    Results for orders placed or performed in visit on 10/06/16  Comprehensive metabolic panel  Result Value Ref Range   Sodium 139 135 - 145 mEq/L   Potassium 4.0 3.5 - 5.1 mEq/L   Chloride 102 96 - 112 mEq/L   CO2 30 19 - 32 mEq/L   Glucose, Bld 101 (H) 70 - 99 mg/dL   BUN 22 6 - 23 mg/dL   Creatinine, Ser 0.89 0.40 - 1.50 mg/dL   Total Bilirubin 0.8 0.2 - 1.2 mg/dL   Alkaline Phosphatase 98 39 - 117 U/L   AST 24 0 - 37 U/L   ALT 14 0 - 53 U/L   Total Protein 6.7 6.0 - 8.3 g/dL   Albumin 4.1 3.5 - 5.2 g/dL   Calcium 9.3 8.4 - 10.5 mg/dL   GFR 85.87 >60.00 mL/min  LDL cholesterol, direct  Result Value Ref Range   Direct LDL 48.0 mg/dL  Lipid panel  Result Value Ref Range   Cholesterol 117 0 - 200 mg/dL   Triglycerides 91.0 0.0 - 149.0  mg/dL   HDL 52.50 >39.00 mg/dL   VLDL 18.2 0.0 - 40.0 mg/dL   LDL Cholesterol 47 0 - 99 mg/dL   Total CHOL/HDL Ratio 2    NonHDL 64.86   Microalbumin / creatinine urine ratio  Result Value Ref Range   Microalb, Ur 2.2 (H) 0.0 - 1.9 mg/dL   Creatinine,U 147.1 mg/dL   Microalb Creat Ratio 1.5 0.0 - 30.0 mg/g  POCT HgB A1C  Result Value Ref Range   Hemoglobin A1C 5.6    I have reviewed the labs   Assessment & Plan:    1. History of hematuria  - Completed a hematuria workup with CT urograms and cystoscopy in December 2016 - malignancies found  2. Left Bosniak 92F cyst  - Patient will have a RUS for surveillance of the cyst  - Will call with  results  3. BPH with LUTS  - IPSS score is 1/1  - Continue conservative management, avoiding bladder irritants and timed voiding's  - Continue finasteride 5 mg daily   - RTC in 12 months for IPSS and exam   Return in about 1 year (around 03/09/2018) for IPSS, and exam.  Zara Council, [facility]  [facility] [address] [place], [place] [address] [phone]

## 2017-03-08 DIAGNOSIS — Z23 Encounter for immunization: Secondary | ICD-10-CM | POA: Diagnosis not present

## 2017-03-09 ENCOUNTER — Ambulatory Visit: Payer: Medicare Other | Admitting: Urology

## 2017-03-09 ENCOUNTER — Ambulatory Visit (INDEPENDENT_AMBULATORY_CARE_PROVIDER_SITE_OTHER): Payer: Medicare Other | Admitting: Urology

## 2017-03-09 ENCOUNTER — Encounter: Payer: Self-pay | Admitting: Urology

## 2017-03-09 VITALS — BP 125/69 | HR 71 | Ht 68.0 in | Wt 214.5 lb

## 2017-03-09 DIAGNOSIS — N138 Other obstructive and reflux uropathy: Secondary | ICD-10-CM | POA: Diagnosis not present

## 2017-03-09 DIAGNOSIS — N401 Enlarged prostate with lower urinary tract symptoms: Secondary | ICD-10-CM | POA: Diagnosis not present

## 2017-03-09 DIAGNOSIS — Z87448 Personal history of other diseases of urinary system: Secondary | ICD-10-CM

## 2017-03-09 DIAGNOSIS — N2889 Other specified disorders of kidney and ureter: Secondary | ICD-10-CM

## 2017-03-16 NOTE — Telephone Encounter (Signed)
Orders

## 2017-03-22 ENCOUNTER — Ambulatory Visit: Payer: Medicare Other | Admitting: Internal Medicine

## 2017-04-04 ENCOUNTER — Ambulatory Visit
Admission: RE | Admit: 2017-04-04 | Discharge: 2017-04-04 | Disposition: A | Discharge status: Home / Self Care | Payer: Medicare Other | Source: Ambulatory Visit | Attending: Urology | Admitting: Urology

## 2017-04-04 DIAGNOSIS — N200 Calculus of kidney: Secondary | ICD-10-CM | POA: Insufficient documentation

## 2017-04-04 DIAGNOSIS — N2889 Other specified disorders of kidney and ureter: Secondary | ICD-10-CM | POA: Diagnosis not present

## 2017-04-04 DIAGNOSIS — N281 Cyst of kidney, acquired: Secondary | ICD-10-CM | POA: Diagnosis not present

## 2017-04-04 DIAGNOSIS — N400 Enlarged prostate without lower urinary tract symptoms: Secondary | ICD-10-CM | POA: Diagnosis not present

## 2017-04-05 ENCOUNTER — Other Ambulatory Visit (INDEPENDENT_AMBULATORY_CARE_PROVIDER_SITE_OTHER): Payer: Medicare Other

## 2017-04-05 DIAGNOSIS — R5383 Other fatigue: Secondary | ICD-10-CM

## 2017-04-05 DIAGNOSIS — Z79899 Other long term (current) drug therapy: Secondary | ICD-10-CM | POA: Diagnosis not present

## 2017-04-05 DIAGNOSIS — E7800 Pure hypercholesterolemia, unspecified: Secondary | ICD-10-CM

## 2017-04-05 DIAGNOSIS — I10 Essential (primary) hypertension: Secondary | ICD-10-CM

## 2017-04-05 DIAGNOSIS — E559 Vitamin D deficiency, unspecified: Secondary | ICD-10-CM | POA: Diagnosis not present

## 2017-04-05 LAB — LIPID PANEL
CHOLESTEROL: 125 mg/dL (ref 0–200)
HDL: 50.1 mg/dL (ref >39.00)
LDL CALC: 46 mg/dL (ref 0–99)
NonHDL: 75.38
TRIGLYCERIDES: 145 mg/dL (ref 0.0–149.0)
Total CHOL/HDL Ratio: 3
VLDL: 29 mg/dL (ref 0.0–40.0)

## 2017-04-05 LAB — COMPREHENSIVE METABOLIC PANEL
ALBUMIN: 4.1 g/dL (ref 3.5–5.2)
ALK PHOS: 103 U/L (ref 39–117)
ALT: 15 U/L (ref 0–53)
AST: 22 U/L (ref 0–37)
BUN: 18 mg/dL (ref 6–23)
CALCIUM: 9.6 mg/dL (ref 8.4–10.5)
CHLORIDE: 101 meq/L (ref 96–112)
CO2: 29 mEq/L (ref 19–32)
CREATININE: 0.88 mg/dL (ref 0.40–1.50)
GFR: 86.9 mL/min (ref >60.00)
Glucose, Bld: 101 mg/dL — ABNORMAL HIGH (ref 70–99)
POTASSIUM: 3.7 meq/L (ref 3.5–5.1)
Sodium: 142 mEq/L (ref 135–145)
TOTAL PROTEIN: 6.9 g/dL (ref 6.0–8.3)
Total Bilirubin: 0.7 mg/dL (ref 0.2–1.2)

## 2017-04-05 LAB — CBC WITH DIFFERENTIAL/PLATELET
BASOS ABS: 0.1 10*3/uL (ref 0.0–0.1)
Basophils Relative: 0.9 % (ref 0.0–3.0)
EOS ABS: 0.2 10*3/uL (ref 0.0–0.7)
Eosinophils Relative: 2 % (ref 0.0–5.0)
HCT: 46.1 % (ref 39.0–52.0)
HEMOGLOBIN: 15.6 g/dL (ref 13.0–17.0)
Lymphocytes Relative: 24.3 % (ref 12.0–46.0)
Lymphs Abs: 1.8 10*3/uL (ref 0.7–4.0)
MCHC: 33.9 g/dL (ref 30.0–36.0)
MCV: 96.1 fl (ref 78.0–100.0)
MONO ABS: 0.7 10*3/uL (ref 0.1–1.0)
Monocytes Relative: 8.8 % (ref 3.0–12.0)
Neutro Abs: 4.8 10*3/uL (ref 1.4–7.7)
Neutrophils Relative %: 64 % (ref 43.0–77.0)
Platelets: 189 10*3/uL (ref 150.0–400.0)
RBC: 4.79 Mil/uL (ref 4.22–5.81)
RDW: 12.9 % (ref 11.5–15.5)
WBC: 7.6 10*3/uL (ref 4.0–10.5)

## 2017-04-05 LAB — VITAMIN D 25 HYDROXY (VIT D DEFICIENCY, FRACTURES): VITD: 29.05 ng/mL — AB (ref 30.00–100.00)

## 2017-04-05 LAB — TSH: TSH: 1.98 u[IU]/mL (ref 0.35–4.50)

## 2017-04-05 LAB — LDL CHOLESTEROL, DIRECT: LDL DIRECT: 53 mg/dL

## 2017-04-06 ENCOUNTER — Other Ambulatory Visit: Payer: Medicare Other

## 2017-04-10 ENCOUNTER — Ambulatory Visit (INDEPENDENT_AMBULATORY_CARE_PROVIDER_SITE_OTHER): Payer: Medicare Other | Admitting: Internal Medicine

## 2017-04-10 ENCOUNTER — Encounter: Payer: Self-pay | Admitting: Internal Medicine

## 2017-04-10 DIAGNOSIS — F0390 Unspecified dementia without behavioral disturbance: Secondary | ICD-10-CM | POA: Diagnosis not present

## 2017-04-10 DIAGNOSIS — K760 Fatty (change of) liver, not elsewhere classified: Secondary | ICD-10-CM | POA: Diagnosis not present

## 2017-04-10 DIAGNOSIS — E669 Obesity, unspecified: Secondary | ICD-10-CM

## 2017-04-10 DIAGNOSIS — I10 Essential (primary) hypertension: Secondary | ICD-10-CM | POA: Diagnosis not present

## 2017-04-10 DIAGNOSIS — Z96641 Presence of right artificial hip joint: Secondary | ICD-10-CM

## 2017-04-10 MED ORDER — FUROSEMIDE 20 MG PO TABS: 20.0000 mg | ORAL_TABLET | Freq: Every day | ORAL | 2 refills | Status: DC

## 2017-04-10 NOTE — Progress Notes (Signed)
Patient ID: Christopher Jenkins, male    DOB: 09-12-1926  Age: 83 y.o. MRN: [id number]  The patient is here for follow up and management of other chronic and acute problems.   The risk factors are reflected in the social history.  Has a pelvic ultrasound last week ordered by Urology . To evaluate kidney cysts seen on February CT.  Bladder mass   The roster of all physicians providing medical care to patient - is listed in the Snapshot section of the chart.  Activities of daily living:  The patient is 100% independent in all ADLs: dressing, toileting, feeding as well as independent mobility  Home safety : The patient has smoke detectors in the home. They wear seatbelts.  There are no firearms at home. There is no violence in the home.   There is no risks for hepatitis, STDs or HIV. There is no   history of blood transfusion. They have no travel history to infectious disease endemic areas of the world.  The patient has seen their dentist in the last six month. They have seen their eye doctor in the last year. They admit to slight hearing difficulty with regard to whispered voices and some television programs.  They have deferred audiologic testing in the last year.  They do not  have excessive sun exposure. Discussed the need for sun protection: hats, long sleeves and use of sunscreen if there is significant sun exposure.   Diet: the importance of a healthy diet is discussed. They do have a healthy diet. HAS GAINED 5 LBS .  Eats mostly at home   The benefits of regular aerobic exercise were discussed. he walks daily for at least  30 minutes and plays tennis twice weekly at [facility].  Lives at [facility],  With his  wife    Depression screen: there are no signs or vegative symptoms of depression- irritability, change in appetite, anhedonia, sadness/tearfullness.  Cognitive assessment: the patient has mild Alzheimers.   dementia, nonvascular diagnosed in 2015. Taking [place]  has not  progressed  still manages all of his financial and personal affairs and is actively engaged. They could  relate day,date,year and events; recalled1/3 objects at 3 minutes; performed clock-face test normally. 100 - 93 -[id number] WITH CONSIDERABLE DIFFICULTY   The following portions of the patient's history were reviewed and updated as appropriate: allergies, current medications, past family history, past medical history,  past surgical history, past social history  and problem list.  Visual acuity was not assessed per patient preference since she has regular follow up with her ophthalmologist. Hearing and body mass index were assessed and reviewed.   During the course of the visit the patient was educated and counseled about appropriate screening and preventive services including : fall prevention , diabetes screening, nutrition counseling, colorectal cancer screening, and recommended immunizations.    CC: Diagnoses of Obesity (BMI 30-39.9), Dementia arising in the senium and presenium, Essential hypertension, History of total right hip replacement, and NAFLD (nonalcoholic fatty liver disease) were pertinent to this visit.  1) troubled by his difficulty remembering peoples's names.  History Christopher Jenkins has a past medical history of BPH (benign prostatic hyperplasia); Duodenal mass; Elevated PSA; Gross hematuria; Hyperlipidemia; Hypertension; Hypertrophy (benign) of prostate; Kidney lesion (07/2015); Obesity; Overweight; Tubular adenoma of colon; and Urinary retention.   He has a past surgical history that includes cataracts; Uvula Surgery (1990); Appendectomy; Total hip arthroplasty (Right, 08/13/2015); and right knee surgery (Right, 12-17/2017).   His family  history includes Heart disease in his father and mother; Stroke in his mother.He reports that he has never smoked. He has never used smokeless tobacco. He reports that he does not drink alcohol or use drugs.  Outpatient Medications Prior to Visit   Medication Sig Dispense Refill  . aspirin 325 MG tablet aspirin 325 mg tablet,delayed release  TAKE 1 TABLET (325 MG TOTAL) BY MOUTH DAILY.    Marland Kitchen atorvastatin (LIPITOR) 40 MG tablet TAKE 1 TABLET (40 MG TOTAL) BY MOUTH DAILY. 90 tablet 3  . Calcium Carbonate-Vitamin D (CALCIUM-VITAMIN D) 600-125 MG-UNIT TABS Take 1 tablet by mouth daily at 2 PM. Reported on 09/02/2015    . clopidogrel (PLAVIX) 75 MG tablet TAKE 1 TABLET (75 MG TOTAL) BY MOUTH DAILY. 90 tablet 3  . docusate sodium (COLACE) 50 MG capsule docusate sodium 50 mg capsule  Take 1 capsule every day by oral route.    . finasteride (PROSCAR) 5 MG tablet Take 1 tablet (5 mg total) by mouth daily. 90 tablet 3  . fish oil-omega-3 fatty acids 1000 MG capsule Take 1 g by mouth daily. Reported on 09/07/2015    . KLOR-CON M20 20 MEQ tablet TAKE 1 TABLET (20 MEQ TOTAL) BY MOUTH DAILY. 30 tablet 3  . memantine (NAMENDA) 10 MG tablet TAKE 1 TABLET (10 MG TOTAL) BY MOUTH 2 (TWO) TIMES DAILY. 180 tablet 1  . Multiple Vitamins-Minerals (MULTIVITAMIN WITH MINERALS) tablet Take 1 tablet by mouth daily. At 1400    . niacin 250 MG tablet Take 250 mg by mouth daily. At 1400    . hydrochlorothiazide (HYDRODIURIL) 25 MG tablet TAKE 1 TABLET (25 MG TOTAL) BY MOUTH DAILY. 90 tablet 3  . nebivolol (BYSTOLIC) 2.5 MG tablet Bystolic 2.5 mg tablet    . traMADol (ULTRAM) 50 MG tablet tramadol 50 mg tablet    . amoxicillin (AMOXIL) 875 MG tablet amoxicillin 875 mg tablet    . Cholecalciferol (VITAMIN D3 PO) Vitamin D3    . ciprofloxacin (CIPRO) 250 MG tablet ciprofloxacin 250 mg tablet    . enoxaparin (LOVENOX) 40 MG/0.4ML injection enoxaparin 40 mg/0.4 mL subcutaneous syringe  INJECT SQ EVERY DAY FOR 14 DAYS    . meloxicam (MOBIC) 7.5 MG tablet meloxicam 7.5 mg tablet    . Omega-3 Fatty Acids (FISH OIL) 1000 MG CAPS Fish Oil    . predniSONE (STERAPRED UNI-PAK 21 TAB) 10 MG (21) TBPK tablet prednisone 10 mg tablets in a dose pack     No facility-administered  medications prior to visit.     Review of Systems   Patient denies headache, fevers, malaise, unintentional weight loss, skin rash, eye pain, sinus congestion and sinus pain, sore throat, dysphagia,  hemoptysis , cough, dyspnea, wheezing, chest pain, palpitations, orthopnea, edema, abdominal pain, nausea, melena, diarrhea, constipation, flank pain, dysuria, hematuria, urinary  Frequency, nocturia, numbness, tingling, seizures,  Focal weakness, Loss of consciousness,  Tremor, insomnia, depression, anxiety, and suicidal ideation.      Objective:  BP 130/72 (BP Location: Left Arm, Patient Position: Sitting, Cuff Size: Normal)   Pulse 77   Temp 98.2 F (36.8 C) (Oral)   Resp 15   Ht 5' 8" (1.727 m)   Wt 219 lb 9.6 oz (99.6 kg)   SpO2 92%   BMI 33.39 kg/m   Physical Exam   General appearance: alert, cooperative and appears stated age Ears: normal TM's and external ear canals both ears Throat: lips, mucosa, and tongue normal; teeth and gums normal Neck: no adenopathy,  no carotid bruit, supple, symmetrical, trachea midline and thyroid not enlarged, symmetric, no tenderness/mass/nodules Back: symmetric, no curvature. ROM normal. No CVA tenderness. Lungs: clear to auscultation bilaterally Heart: regular rate and rhythm, S1, S2 normal, no murmur, click, rub or gallop Abdomen: soft, non-tender; bowel sounds normal; no masses,  no organomegaly Pulses: 2+ and symmetric Skin: Skin color, texture, turgor normal. No rashes or lesions Lymph nodes: Cervical, supraclavicular, and axillary nodes normal.    Assessment & Plan:   Problem List Items Addressed This Visit    Dementia arising in the senium and presenium    Cognitive deficits affecting calculation,  Recall but not orientation .  No changes to meds today       H/O total hip arthroplasty    Excellent recovery.  Playing couples tennis weekly       Hypertension    Well controlled on current regimen. Renal function stable, no changes  today.  Lab Results  Component Value Date   CREATININE 0.88 04/05/2017   Lab Results  Component Value Date   NA 142 04/05/2017   K 3.7 04/05/2017   CL 101 04/05/2017   CO2 29 04/05/2017         Relevant Medications   furosemide (LASIX) 20 MG tablet   NAFLD (nonalcoholic fatty liver disease)    Incidental finding on CT,  Normal transaminases, mild elevation of alk phos noted with normal GB,  No ductal dilitation on CT.  Continue to work on achieving weight loss  ,  And continue statin . Hep A/B vaccines recommended.   Lab Results  Component Value Date   ALT 15 04/05/2017   AST 22 04/05/2017   ALKPHOS 103 04/05/2017   BILITOT 0.7 04/05/2017         Obesity (BMI 30-39.9)    I have reviewed patient's   BMI and encouraged   weight loss with goal of 10% of body weight over the next 6 months using a low glycemic index diet and regular exercise a minimum of 5 days per week as tolerated by orthopedic conditions.        A total of 40 minutes was spent with patient more than half of which was spent in counseling patient on the above mentioned issues , reviewing and explaining recent labs and imaging studies done, and coordination of care.   I have discontinued Mr. Loflin's hydrochlorothiazide, amoxicillin, ciprofloxacin, enoxaparin, meloxicam, Fish Oil, predniSONE, and Cholecalciferol (VITAMIN D3 PO). I am also having him start on furosemide. Additionally, I am having him maintain his niacin, fish oil-omega-3 fatty acids, multivitamin with minerals, Calcium-Vitamin D, finasteride, memantine, clopidogrel, KLOR-CON M20, atorvastatin, docusate sodium, nebivolol, traMADol, and aspirin.  Meds ordered this encounter  Medications  . furosemide (LASIX) 20 MG tablet    Sig: Take 1 tablet (20 mg total) by mouth daily. In the morning    Dispense:  30 tablet    Refill:  2    Medications Discontinued During This Encounter  Medication Reason  . amoxicillin (AMOXIL) 875 MG tablet Patient has  not taken in last 30 days  . Cholecalciferol (VITAMIN D3 PO) Patient has not taken in last 30 days  . ciprofloxacin (CIPRO) 250 MG tablet Patient has not taken in last 30 days  . enoxaparin (LOVENOX) 40 MG/0.4ML injection Patient has not taken in last 30 days  . meloxicam (MOBIC) 7.5 MG tablet Patient has not taken in last 30 days  . Omega-3 Fatty Acids (FISH OIL) 8185 MG CAPS Duplicate  .  predniSONE (STERAPRED UNI-PAK 21 TAB) 10 MG (21) TBPK tablet Patient has not taken in last 30 days  . hydrochlorothiazide (HYDRODIURIL) 25 MG tablet     Follow-up: Return in about 6 months (around 10/09/2017).   [name], MD

## 2017-04-10 NOTE — Patient Instructions (Signed)
I want you to lose 11 lbs by your next visit in 6 months  By reducing your portion size.    I do NOT  think that 2 DAYS of FASTING IS A GOOD IDEA AT YOUR AGE    I have changed your fluid pill from hctz to furosemide .  You can finish your hctz and then [facility]    Health Maintenance, Male A healthy lifestyle and preventive care is important for your health and wellness. Ask your health care provider about what schedule of regular examinations is right for you. What should I know about weight and diet? Eat a Healthy Diet  Eat plenty of vegetables, fruits, whole grains, low-fat dairy products, and lean protein.  Do not eat a lot of foods high in solid fats, added sugars, or salt.  Maintain a Healthy Weight Regular exercise can help you achieve or maintain a healthy weight. You should:  Do at least 150 minutes of exercise each week. The exercise should increase your heart rate and make you sweat (moderate-intensity exercise).  Do strength-training exercises at least twice a week.  Watch Your Levels of Cholesterol and Blood Lipids  Have your blood tested for lipids and cholesterol every 5 years starting at 81 years of age. If you are at high risk for heart disease, you should start having your blood tested when you are 81 years old. You may need to have your cholesterol levels checked more often if: ? Your lipid or cholesterol levels are high. ? You are older than 81 years of age. ? You are at high risk for heart disease.  What should I know about cancer screening? Many types of cancers can be detected early and may often be prevented. Lung Cancer  You should be screened every year for lung cancer if: ? You are a current smoker who has smoked for at least 30 years. ? You are a former smoker who has quit within the past 15 years.  Talk to your health care provider about your screening options, when you should start screening, and how often you should be  screened.  Colorectal Cancer  Routine colorectal cancer screening usually begins at 80 years of age and should be repeated every 5-10 years until you are 81 years old. You may need to be screened more often if early forms of precancerous polyps or small growths are found. Your health care provider may recommend screening at an earlier age if you have risk factors for colon cancer.  Your health care provider may recommend using home test kits to check for hidden blood in the stool.  A small camera at the end of a tube can be used to examine your colon (sigmoidoscopy or colonoscopy). This checks for the earliest forms of colorectal cancer.  Prostate and Testicular Cancer  Depending on your age and overall health, your health care provider may do certain tests to screen for prostate and testicular cancer.  Talk to your health care provider about any symptoms or concerns you have about testicular or prostate cancer.  Skin Cancer  Check your skin from head to toe regularly.  Tell your health care provider about any new moles or changes in moles, especially if: ? There is a change in a mole's size, shape, or color. ? You have a mole that is larger than a pencil eraser.  Always use sunscreen. Apply sunscreen liberally and repeat throughout the day.  Protect yourself by wearing long sleeves, pants,  a wide-brimmed hat, and sunglasses when outside.  What should I know about heart disease, diabetes, and high blood pressure?  If you are 63-30 years of age, have your blood pressure checked every 3-5 years. If you are 54 years of age or older, have your blood pressure checked every year. You should have your blood pressure measured twice-once when you are at a hospital or clinic, and once when you are not at a hospital or clinic. Record the average of the two measurements. To check your blood pressure when you are not at a hospital or clinic, you can use: ? An automated blood pressure machine at a  pharmacy. ? A home blood pressure monitor.  Talk to your health care provider about your target blood pressure.  If you are between 29-78 years old, ask your health care provider if you should take aspirin to prevent heart disease.  Have regular diabetes screenings by checking your fasting blood sugar level. ? If you are at a normal weight and have a low risk for diabetes, have this test once every three years after the age of 93. ? If you are overweight and have a high risk for diabetes, consider being tested at a younger age or more often.  A one-time screening for abdominal aortic aneurysm (AAA) by ultrasound is recommended for men aged 26-75 years who are current or former smokers. What should I know about preventing infection? Hepatitis B If you have a higher risk for hepatitis B, you should be screened for this virus. Talk with your health care provider to find out if you are at risk for hepatitis B infection. Hepatitis C Blood testing is recommended for:  Everyone born from [birth year] through 1963.  Anyone with known risk factors for hepatitis C.  Sexually Transmitted Diseases (STDs)  You should be screened each year for STDs including gonorrhea and chlamydia if: ? You are sexually active and are younger than 81 years of age. ? You are older than 81 years of age and your health care provider tells you that you are at risk for this type of infection. ? Your sexual activity has changed since you were last screened and you are at an increased risk for chlamydia or gonorrhea. Ask your health care provider if you are at risk.  Talk with your health care provider about whether you are at high risk of being infected with HIV. Your health care provider may recommend a prescription medicine to help prevent HIV infection.  What else can I do?  Schedule regular health, dental, and eye exams.  Stay current with your vaccines (immunizations).  Do not use any tobacco products, such as  cigarettes, chewing tobacco, and e-cigarettes. If you need help quitting, ask your health care provider.  Limit alcohol intake to no more than 2 drinks per day. One drink equals 12 ounces of beer, 5 ounces of wine, or 1 ounces of hard liquor.  Do not use street drugs.  Do not share needles.  Ask your health care provider for help if you need support or information about quitting drugs.  Tell your health care provider if you often feel depressed.  Tell your health care provider if you have ever been abused or do not feel safe at home. This information is not intended to replace advice given to you by your health care provider. Make sure you discuss any questions you have with your health care provider. Document Released: 12/03/2007 Document Revised: 02/03/2016 Document Reviewed: 03/10/2015 Elsevier Interactive  Patient Education  [employer].

## 2017-04-11 NOTE — Assessment & Plan Note (Signed)
Excellent recovery.  Playing couples tennis weekly

## 2017-04-11 NOTE — Assessment & Plan Note (Signed)
Well controlled on current regimen. Renal function stable, no changes today.  Lab Results  Component Value Date   CREATININE 0.88 04/05/2017   Lab Results  Component Value Date   NA 142 04/05/2017   K 3.7 04/05/2017   CL 101 04/05/2017   CO2 29 04/05/2017

## 2017-04-11 NOTE — Assessment & Plan Note (Signed)
I have reviewed patient's   BMI and encouraged   weight loss with goal of 10% of body weight over the next 6 months using a low glycemic index diet and regular exercise a minimum of 5 days per week as tolerated by orthopedic conditions.

## 2017-04-11 NOTE — Assessment & Plan Note (Signed)
Cognitive deficits affecting calculation,  Recall but not orientation .  No changes to meds today

## 2017-04-11 NOTE — Assessment & Plan Note (Signed)
Incidental finding on CT,  Normal transaminases, mild elevation of alk phos noted with normal GB,  No ductal dilitation on CT.  Continue to work on achieving weight loss  ,  And continue statin . Hep A/B vaccines recommended.   Lab Results  Component Value Date   ALT 15 04/05/2017   AST 22 04/05/2017   ALKPHOS 103 04/05/2017   BILITOT 0.7 04/05/2017

## 2017-04-12 ENCOUNTER — Ambulatory Visit: Payer: Medicare Other | Admitting: Internal Medicine

## 2017-04-25 ENCOUNTER — Telehealth: Payer: Self-pay | Admitting: Internal Medicine

## 2017-04-25 NOTE — Telephone Encounter (Signed)
Pt lvm requesting cb concerning a new medication that was given to him, although there is nothing on hs paperwork regarding this. [facility] He wants to get the background on this medicaton. He did not say the name of the medicaton. Pt cb [phone]

## 2017-04-28 NOTE — Telephone Encounter (Signed)
LMTCB

## 2017-05-03 ENCOUNTER — Telehealth: Payer: Self-pay | Admitting: Family Medicine

## 2017-05-03 DIAGNOSIS — N2889 Other specified disorders of kidney and ureter: Secondary | ICD-10-CM

## 2017-05-03 NOTE — Telephone Encounter (Signed)
Christopher Jenkins spoke with pt and made aware of RUS results and needing CT.

## 2017-05-03 NOTE — Telephone Encounter (Signed)
Left message for patient to return call. Order has been placed for CT

## 2017-05-03 NOTE — Telephone Encounter (Signed)
Attempted to call pt no answer. Mailed an unable to reach letter.

## 2017-05-03 NOTE — Telephone Encounter (Signed)
-----   Message from Christopher Riis, PA-C sent at 05/02/2017  9:38 PM EST ----- Please notify Mr. Assefa that the renal ultrasound could not adequately evaluate the lesion in his left kidney.  We will need to obtain a dedicated CT (CT abdomen w/wo) for further evaluation.

## 2017-05-04 ENCOUNTER — Other Ambulatory Visit: Payer: Self-pay | Admitting: Internal Medicine

## 2017-05-15 ENCOUNTER — Ambulatory Visit
Admission: RE | Admit: 2017-05-15 | Discharge: 2017-05-15 | Disposition: A | Discharge status: Home / Self Care | Payer: Medicare Other | Source: Ambulatory Visit | Attending: Urology | Admitting: Urology

## 2017-05-15 DIAGNOSIS — N281 Cyst of kidney, acquired: Secondary | ICD-10-CM | POA: Insufficient documentation

## 2017-05-15 DIAGNOSIS — M47816 Spondylosis without myelopathy or radiculopathy, lumbar region: Secondary | ICD-10-CM | POA: Diagnosis not present

## 2017-05-15 DIAGNOSIS — I700 Atherosclerosis of aorta: Secondary | ICD-10-CM | POA: Insufficient documentation

## 2017-05-15 DIAGNOSIS — I2510 Atherosclerotic heart disease of native coronary artery without angina pectoris: Secondary | ICD-10-CM | POA: Insufficient documentation

## 2017-05-15 DIAGNOSIS — M5136 Other intervertebral disc degeneration, lumbar region: Secondary | ICD-10-CM | POA: Diagnosis not present

## 2017-05-15 DIAGNOSIS — N2889 Other specified disorders of kidney and ureter: Secondary | ICD-10-CM

## 2017-05-15 DIAGNOSIS — K449 Diaphragmatic hernia without obstruction or gangrene: Secondary | ICD-10-CM | POA: Insufficient documentation

## 2017-05-15 DIAGNOSIS — K7689 Other specified diseases of liver: Secondary | ICD-10-CM | POA: Diagnosis not present

## 2017-05-15 DIAGNOSIS — K5750 Diverticulosis of both small and large intestine without perforation or abscess without bleeding: Secondary | ICD-10-CM | POA: Diagnosis not present

## 2017-05-15 MED ORDER — IOPAMIDOL (ISOVUE-370) INJECTION 76%
100.0000 mL | Freq: Once | INTRAVENOUS | Status: AC | PRN
  Administered 2017-05-15: 100 mL via INTRAVENOUS

## 2017-05-21 ENCOUNTER — Other Ambulatory Visit: Payer: Self-pay | Admitting: Urology

## 2017-05-21 DIAGNOSIS — N281 Cyst of kidney, acquired: Secondary | ICD-10-CM

## 2017-05-21 NOTE — Progress Notes (Signed)
Orders for CT placed.

## 2017-05-22 ENCOUNTER — Telehealth: Payer: Self-pay

## 2017-05-22 NOTE — Telephone Encounter (Signed)
-----   Message from Nori Riis, PA-C sent at 05/21/2017  4:07 PM EST ----- Please let Mr. Minehart know that his kidney cysts are relatively unchanged.  It is recommended to continue to keep watch on the cysts and repeat the study in one year.  Orders are placed.

## 2017-05-22 NOTE — Telephone Encounter (Signed)
Spoke with pt wife and made aware of imaging results. Also made aware Larene Beach wants another scan in 58yr. Wife voiced understanding of whole conversation.

## 2017-05-23 ENCOUNTER — Telehealth: Payer: Self-pay | Admitting: Internal Medicine

## 2017-05-23 NOTE — Telephone Encounter (Unsigned)
Copied from [place]. Topic: Quick Communication - See Telephone Encounter >> May 23, 2017  3:04 PM Hewitt Shorts wrote: CRM for notification. See Telephone encounter for: pt is needing to know why the pharmacy is calling stating that  The doctor wanted him to stop the proscar  [phone]

## 2017-05-23 NOTE — Telephone Encounter (Signed)
Medication  Christopher Jenkins

## 2017-05-24 ENCOUNTER — Other Ambulatory Visit: Payer: Self-pay

## 2017-05-24 DIAGNOSIS — N401 Enlarged prostate with lower urinary tract symptoms: Secondary | ICD-10-CM

## 2017-05-24 MED ORDER — FINASTERIDE 5 MG PO TABS: 5.0000 mg | ORAL_TABLET | Freq: Every day | ORAL | 3 refills | Status: DC

## 2017-05-24 NOTE — Telephone Encounter (Signed)
[facility] to clarify why he should be stopping [facility], [facility] says [facility] were just [facility] aware that he was out of refills. Advised patient to call urologist for more refills since they write the prescription. Patient gave verbal understanding.

## 2017-06-10 ENCOUNTER — Other Ambulatory Visit: Payer: Self-pay | Admitting: Internal Medicine

## 2017-06-14 ENCOUNTER — Telehealth: Payer: Self-pay | Admitting: *Deleted

## 2017-06-14 NOTE — Telephone Encounter (Signed)
I called patient & spoke with wife. She will have pt call us back with more information. Not sure which medication he is referring to. But if it is hie Finasteride, it is filled by urology & they have already sent in another refill for him.  Copied from [place]. Topic: Inquiry >> Jun 14, 2017 12:13 PM Mot[place], Claiborne [place], [place] wrote: Reason for CRM: Patient calling because he wantes to know why his doctor took him off of his medication (Doesn't know the name of medication). If someone could give him a call back at [phone]

## 2017-07-01 ENCOUNTER — Other Ambulatory Visit: Payer: Self-pay | Admitting: Internal Medicine

## 2017-07-05 IMAGING — MR MR MRA HEAD W/O CM
1 series · 23 of 48 positions shown · non-contrast
Comparison: MRI brain 07/12/2013.

CLINICAL DATA: Episodes of diplopia with altered mental status over
the past 2 weeks.

EXAM:
MRA HEAD WITHOUT CONTRAST
TECHNIQUE: Angiographic images of the Circle of Willis were obtained using MRA
technique without intravenous contrast.

[Series 3: TOF · axial · non-contrast · 0.7mm · 0.37mm/px · z∈[-109,+23]mm · 23 of 200 slices shown]
[im 1/200]
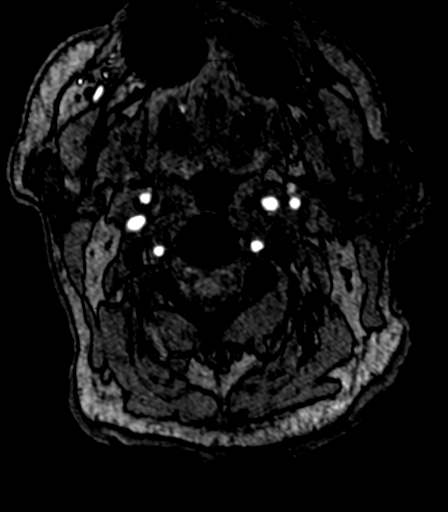
[im 5/200]
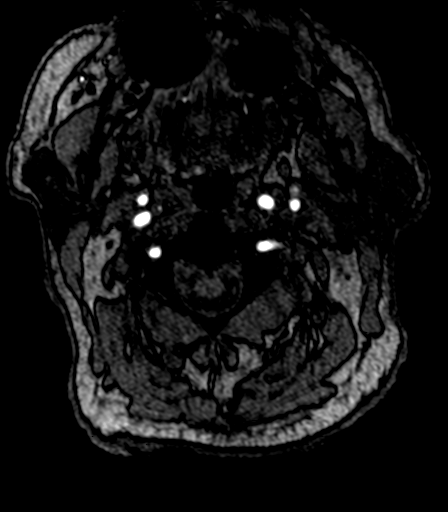
[im 9/200]
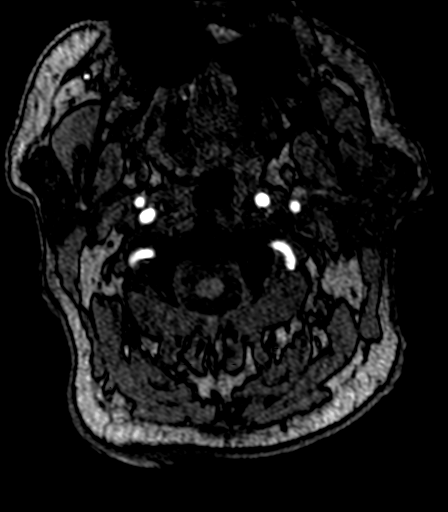
[im 13/200]
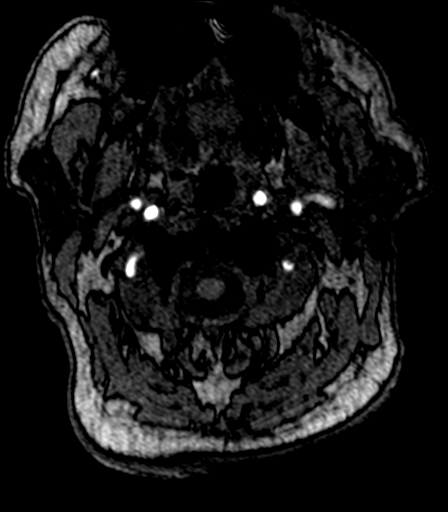
[im 17/200]
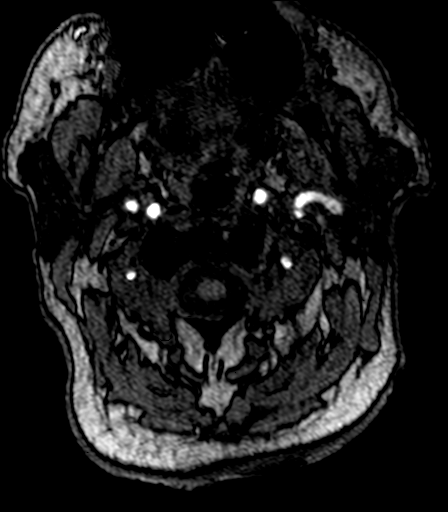
[im 22/200]
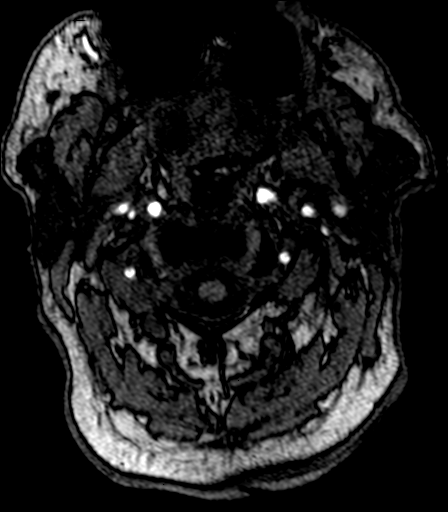
[im 26/200]
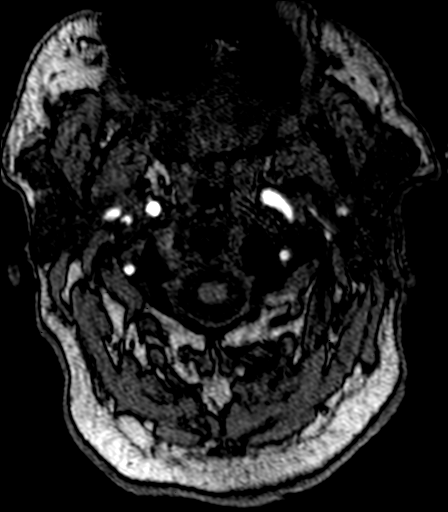
[im 30/200]
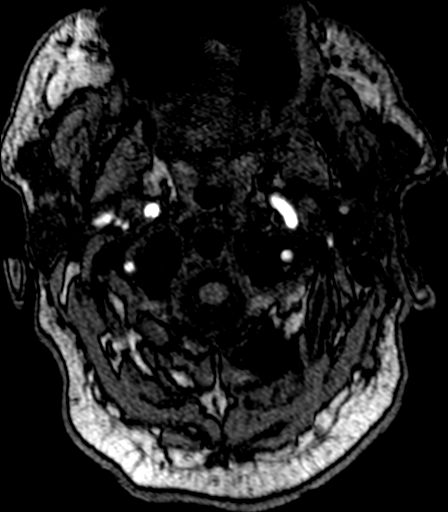
[im 34/200]
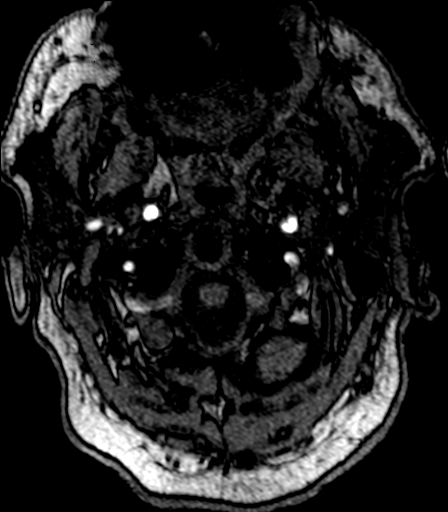
[im 39/200]
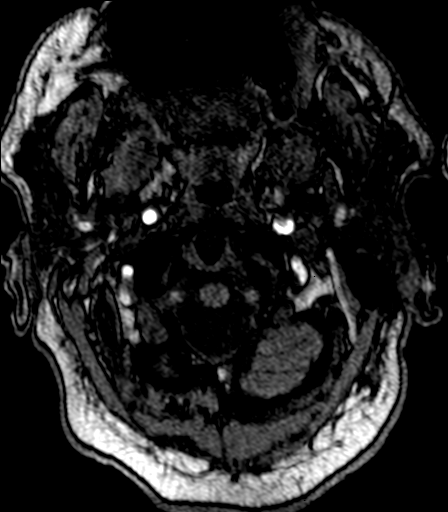
[im 43/200]
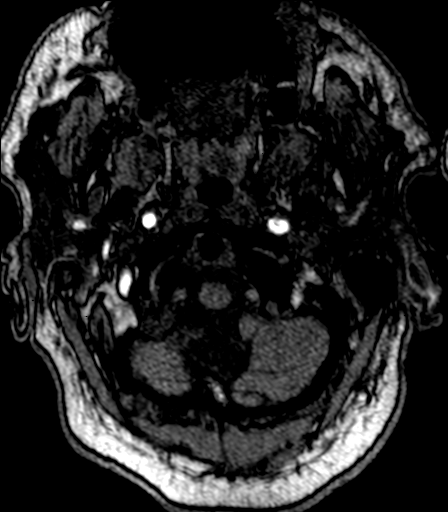
[im 47/200]
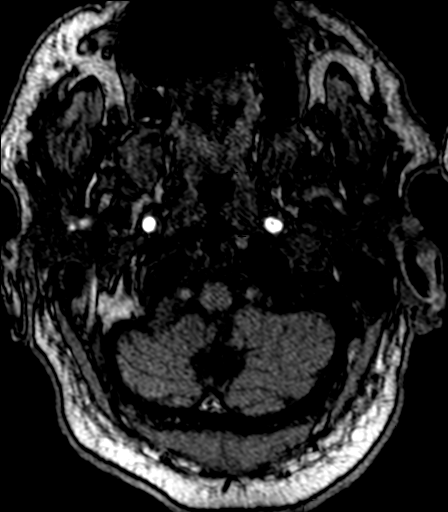
[im 51/200]
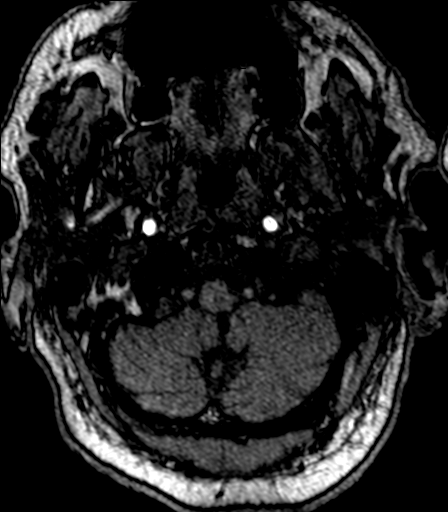
[im 56/200]
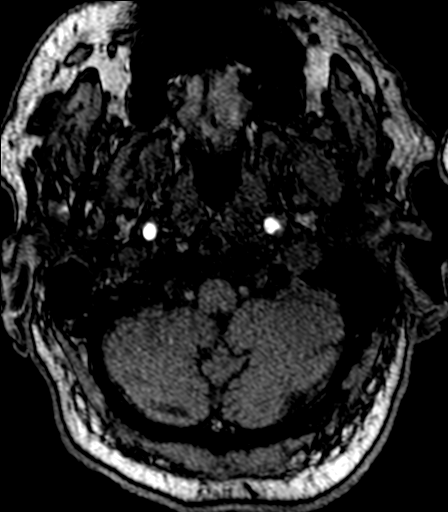
[im 60/200]
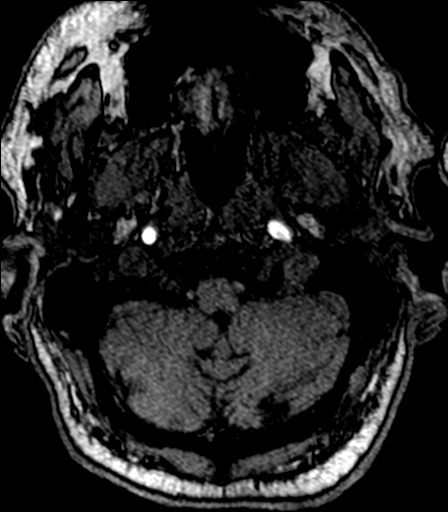
[im 64/200]
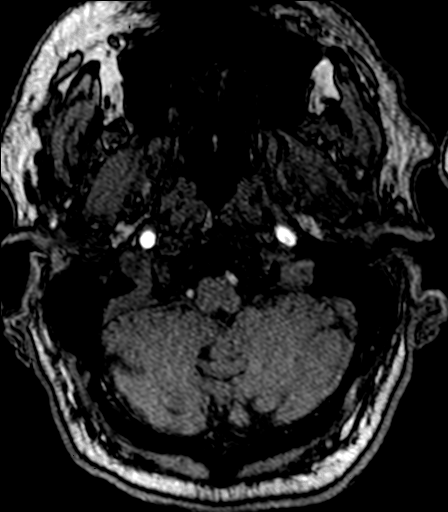
[im 89/200]
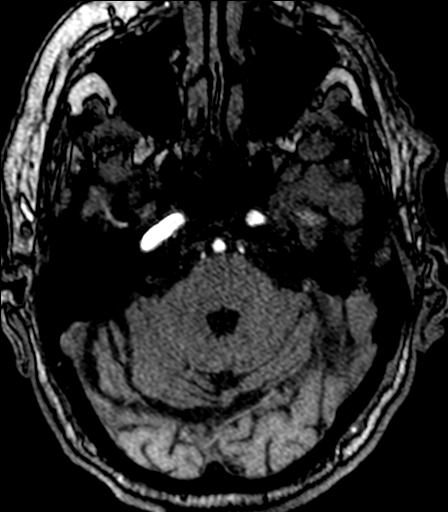
[im 102/200]
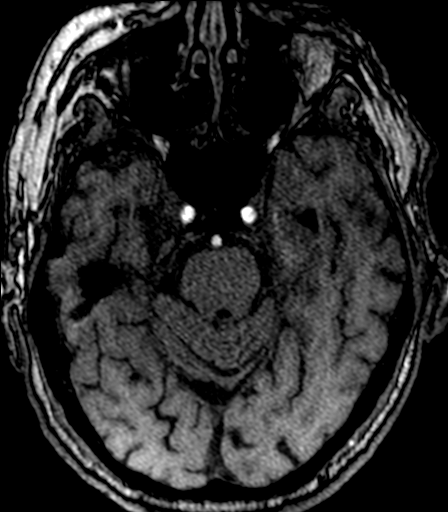
[im 115/200]
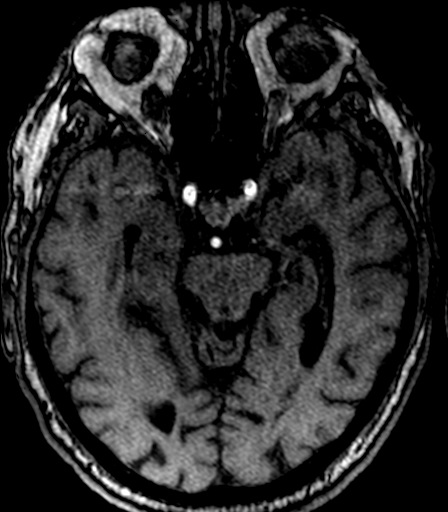
[im 140/200]
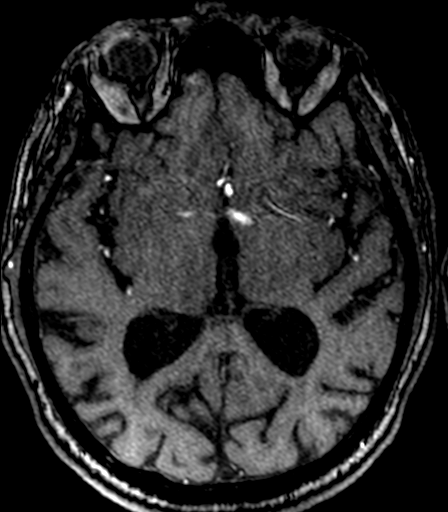
[im 166/200]
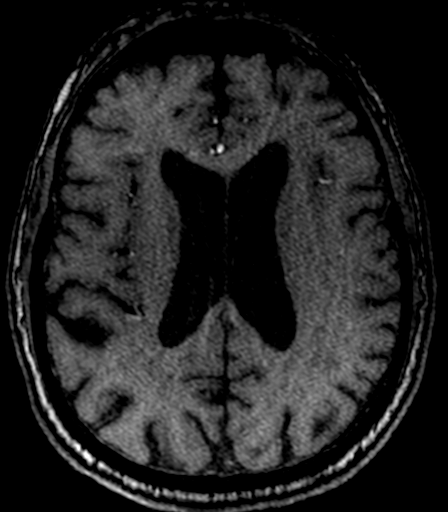
[im 170/200]
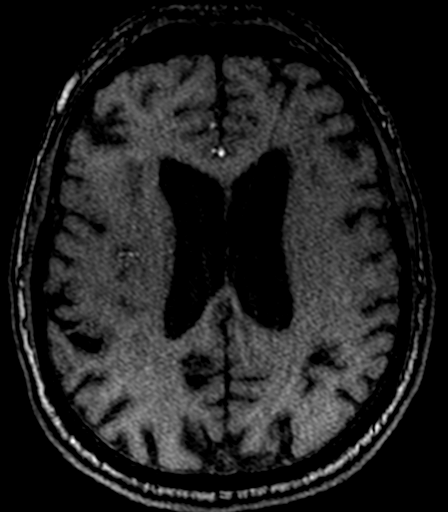
[im 191/200]
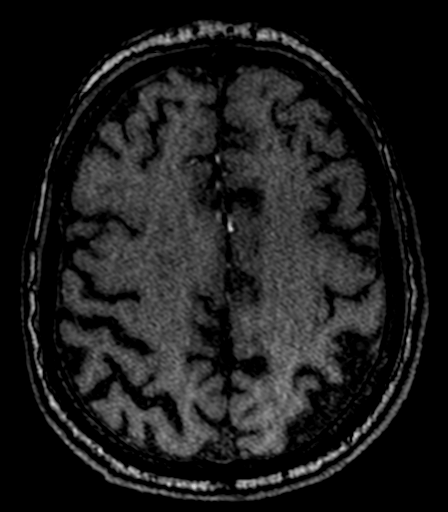

[23 of 48 positions shown; findings below may reference images not displayed]

FINDINGS: There is moderately poor flow related enhancement in the
intracranial compartment. This is not due to any obvious technical
factors. There may be poor cardiac output/ advanced age. Overall the
study is diagnostic.

The internal carotid arteries are widely patent. The basilar artery
is widely patent.

Vertebral arteries are codominant.

There is no proximal stenosis of the anterior, middle, or posterior
cerebral arteries. Fetal origin RIGHT PCA. No definite cerebellar
branch occlusion. No intracranial aneurysm.
IMPRESSION: Moderately poor quality intracranial MRA without proximal flow
limiting stenosis or occlusion of the medium to large vessels.

If concern for recent infarction, routine brain MR could be helpful
in further evaluation.

## 2017-07-17 ENCOUNTER — Other Ambulatory Visit: Payer: Self-pay | Admitting: Internal Medicine

## 2017-07-18 NOTE — Telephone Encounter (Signed)
Okay to refill? 

## 2017-07-19 IMAGING — CR DG HIP (WITH OR WITHOUT PELVIS) 2-3V*R*
1 series · 3 of 3 positions shown · non-contrast
Comparison: None.

CLINICAL DATA: Right leg pain for 2 weeks without known injury.

EXAM:
DG HIP (WITH OR WITHOUT PELVIS) 2-3V RIGHT

[Series 1: dg hip unilat w or w/o pelvis 2-3 views  · non-contrast · 0.14mm/px · 3 of 3 slices shown]
[im 1/3]
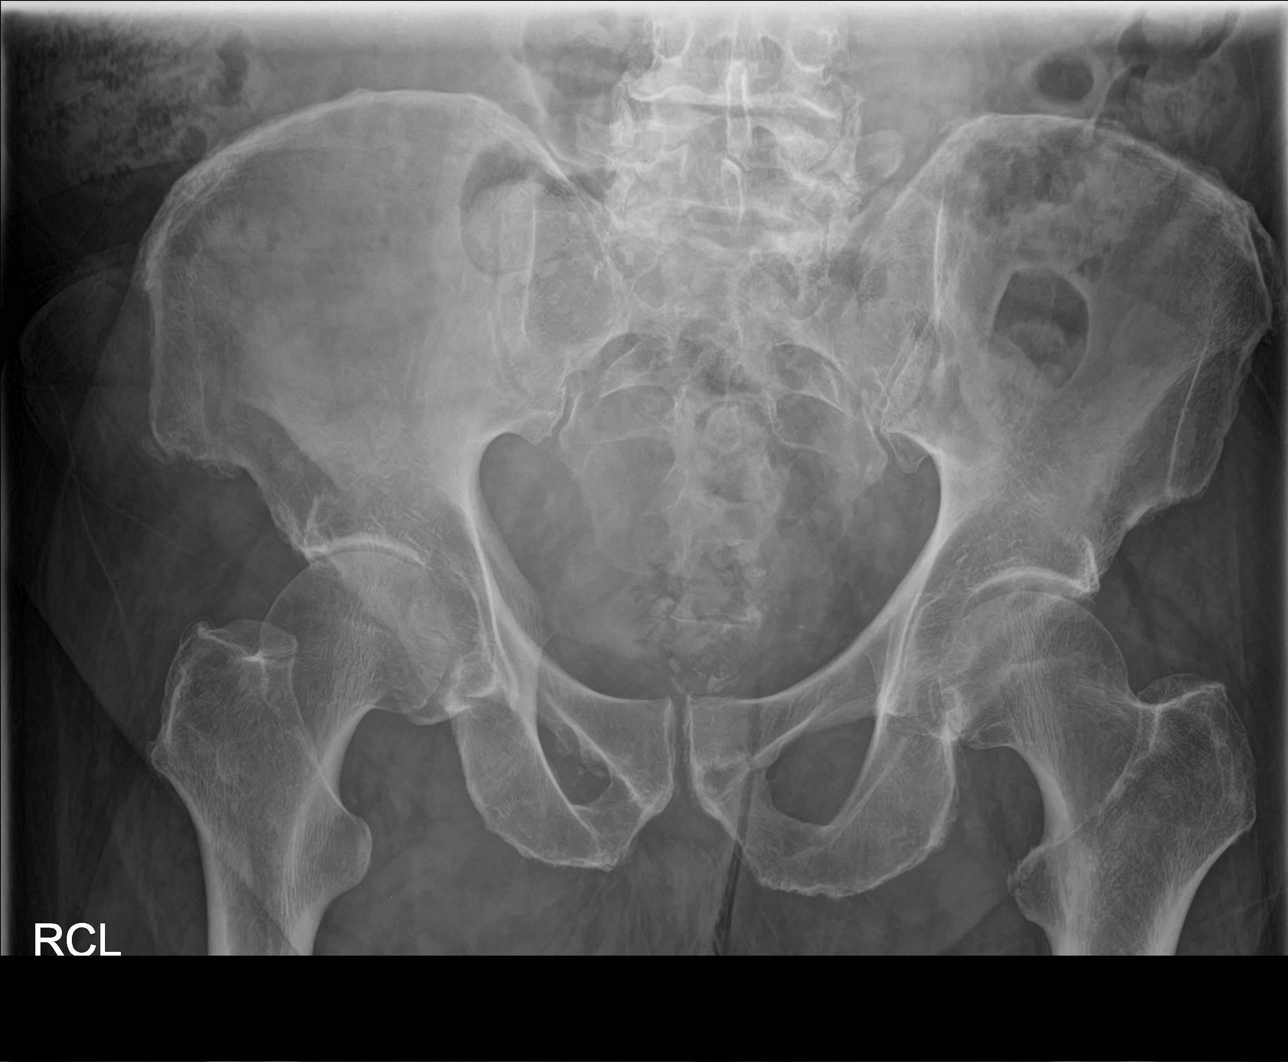
[im 2/3]
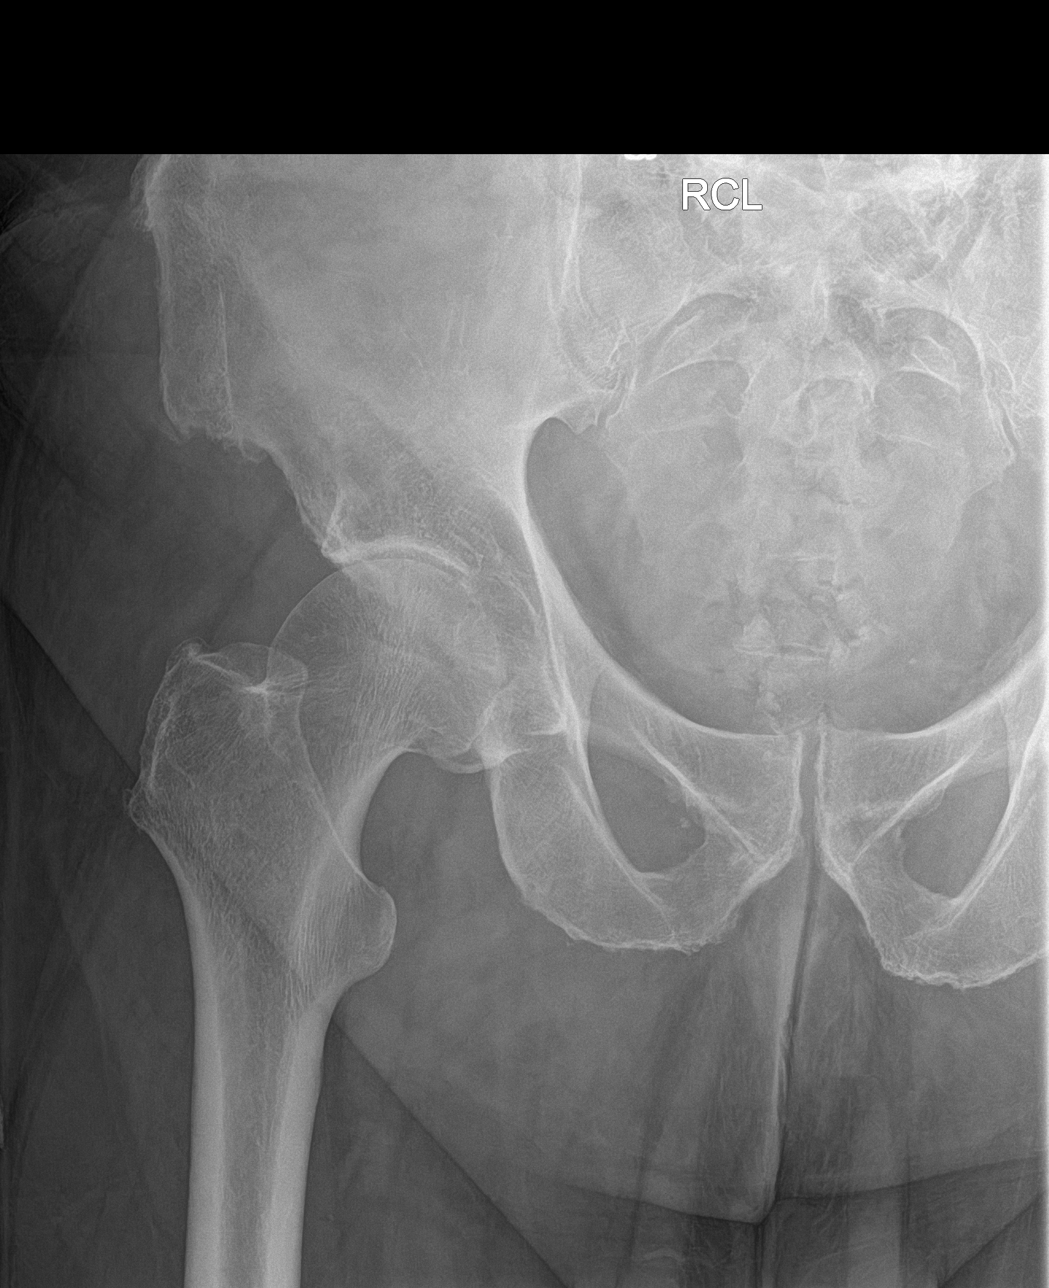
[im 3/3]
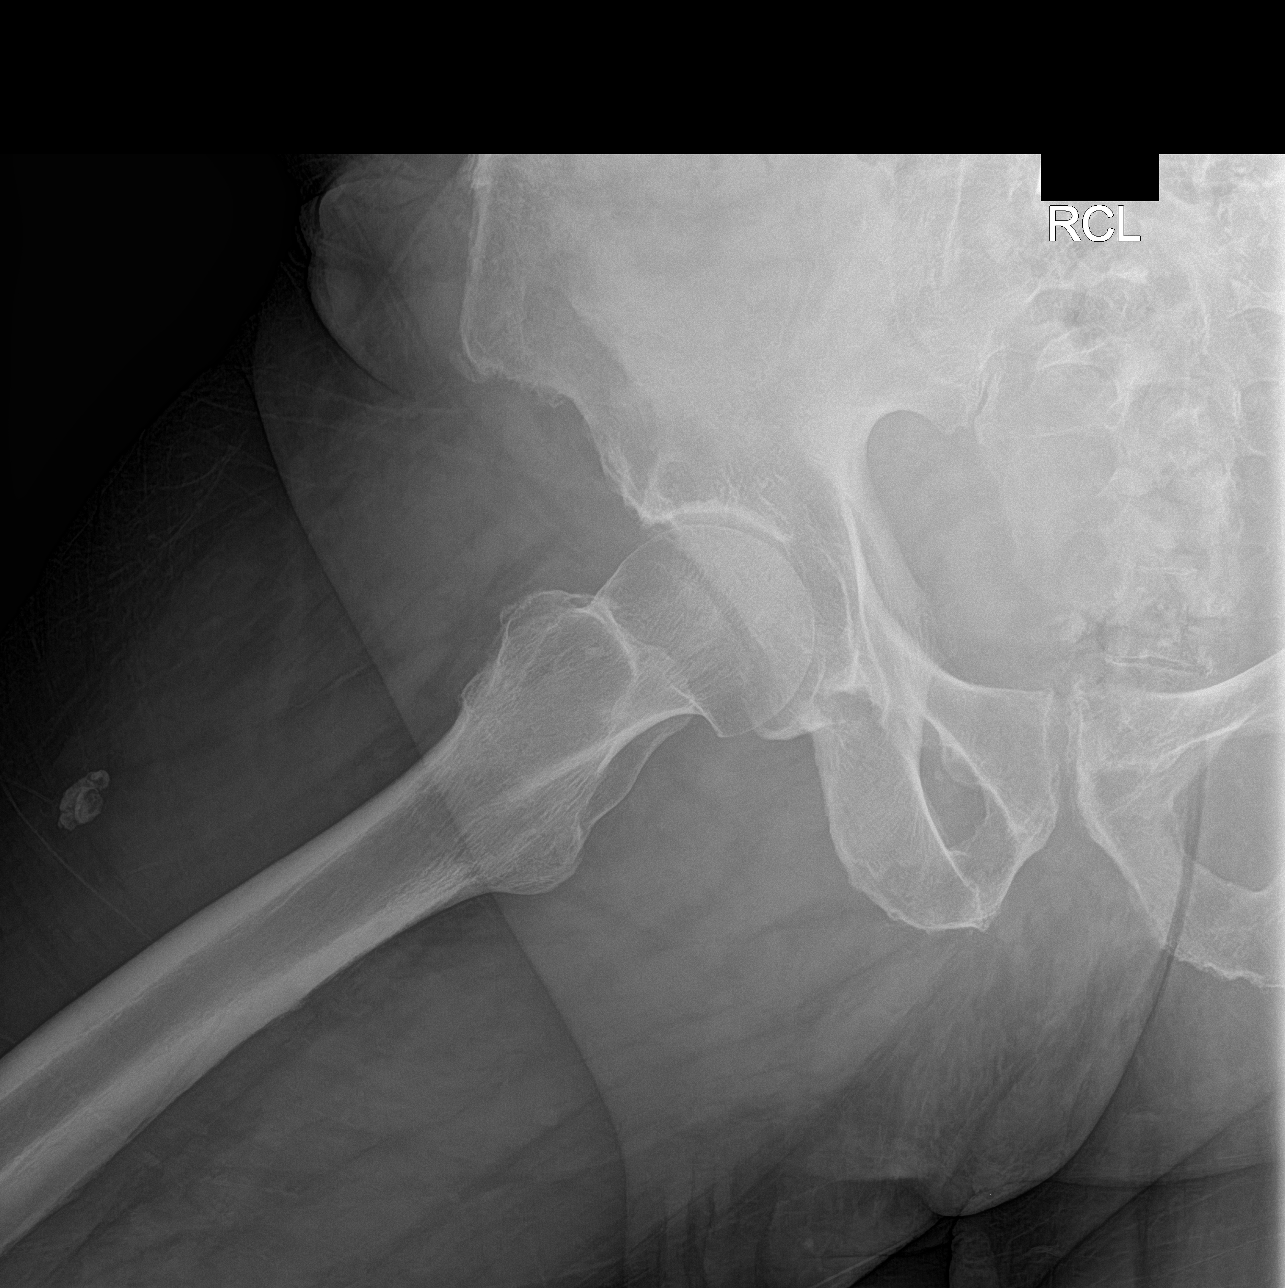

[3 of 3 positions shown; findings below may reference images not displayed]

FINDINGS: There is no evidence of hip fracture or dislocation. There is no
evidence of arthropathy or other focal bone abnormality.
IMPRESSION: Normal right hip.

## 2017-07-20 ENCOUNTER — Other Ambulatory Visit: Payer: Self-pay | Admitting: Internal Medicine

## 2017-07-20 ENCOUNTER — Telehealth: Payer: Self-pay | Admitting: Internal Medicine

## 2017-07-20 MED ORDER — DOCUSATE SODIUM 150 MG/15ML PO LIQD: ORAL | 0 refills | Status: DC

## 2017-07-20 NOTE — Telephone Encounter (Signed)
Medication refilled and sent °

## 2017-07-20 NOTE — Progress Notes (Signed)
diocto earwax liqid reordered

## 2017-07-20 NOTE — Telephone Encounter (Signed)
Ok to refill 

## 2017-07-20 NOTE — Telephone Encounter (Signed)
diocto syrup refill Last OV: 04/10/17 Last Refill:09/19/16 Pharmacy:[facility] [address] [place]

## 2017-07-20 NOTE — Telephone Encounter (Signed)
Contacted pharmacy, [facility] [address] [place], [place],  reference pt refill request for diocto syrup per Dr Derrel Nip; see CRM # [phone]; [facility] the [facility] was originally filled 09/19/16 and is awaiting for MD approval; will route to [facility] for final disposition.  diocto syrup refill Last OV: 04/10/17 Last Refill:09/19/16 Pharmacy:[facility] [address] [place]

## 2017-07-20 NOTE — Telephone Encounter (Signed)
Copied from [place] [id number]. Topic: Quick Communication - Rx Refill/Question >> Jul 20, 2017 12:14 PM Arletha Grippe wrote: Medication: DIOCTO 50 MG/5ML syrup   Has the patient contacted their pharmacy? Yes.   Told to call   (Agent: If no, request that the patient contact the pharmacy for the refill.)   Preferred Pharmacy (with phone number or street name): [facility] [address]    Agent: Please be advised that RX refills may take up to 3 business days. We ask that you follow-up with your pharmacy.

## 2017-07-22 ENCOUNTER — Other Ambulatory Visit: Payer: Self-pay | Admitting: Internal Medicine

## 2017-07-25 ENCOUNTER — Telehealth: Payer: Self-pay

## 2017-07-25 DIAGNOSIS — H6120 Impacted cerumen, unspecified ear: Secondary | ICD-10-CM

## 2017-07-25 NOTE — Telephone Encounter (Addendum)
[place] ent referral in process

## 2017-07-25 NOTE — Telephone Encounter (Signed)
Copied from [place] [id number]. Topic: Referral - Status >> Jul 24, 2017  3:42 PM Cecelia Byars, NT wrote: Reason for CRM: Patient called to have referral given  to have wax removed from his ears please advise

## 2017-07-25 NOTE — Addendum Note (Signed)
Addended by: [name] on: 07/25/2017 03:10 PM   Modules accepted: Orders

## 2017-07-25 NOTE — Telephone Encounter (Signed)
Please advise 

## 2017-07-26 NOTE — Telephone Encounter (Signed)
Spoke with pt's wife. She stated that the pt was not at home at this time. Told the wife that I would give the pt a call back again after lunch.

## 2017-07-30 ENCOUNTER — Other Ambulatory Visit: Payer: Self-pay | Admitting: Internal Medicine

## 2017-07-31 NOTE — Telephone Encounter (Signed)
Refilled: 02/06/2017 Last OV: 04/10/2017 Next OV: 10/09/2017

## 2017-08-01 NOTE — Telephone Encounter (Signed)
Spoke with pt's wife and she stated that the pt was not at home but that she would have him to give [place] a call back either before 5 or tomorrow morning.

## 2017-08-14 DIAGNOSIS — H903 Sensorineural hearing loss, bilateral: Secondary | ICD-10-CM | POA: Diagnosis not present

## 2017-08-14 DIAGNOSIS — H6122 Impacted cerumen, left ear: Secondary | ICD-10-CM | POA: Diagnosis not present

## 2017-08-16 NOTE — Telephone Encounter (Signed)
Referral placed.

## 2017-08-22 ENCOUNTER — Ambulatory Visit: Payer: Self-pay

## 2017-08-22 NOTE — Telephone Encounter (Signed)
Reported one week hx of nasal congestion, and cough.  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  Denied shortness of breath, wheezing, chest congestion, fever or chills.  Denied sore throat.  Denied any body aches, or sinus pressure.  Stated he is not sure of color of nasal drainage. The pt. requested some advice for home care.  Care advice given per cold symptom protocol.  Encouraged to call if symptoms worsen.  Verb. Understanding.         Reason for Disposition . Cough with cold symptoms (e.g., runny nose, postnasal drip, throat clearing)  Answer Assessment - Initial Assessment Questions 1. ONSET: "When did the cough begin?"      About one week ago 2. SEVERITY: "How bad is the cough today?"      Moderate; stated "I cough because I have drainage in my throat." 3. RESPIRATORY DISTRESS: "Describe your breathing."      Denied shortness of breath 4. FEVER: "Do you have a fever?" If so, ask: "What is your temperature, how was it measured, and when did it start?"     Denied fever /chills 5. SPUTUM: "Describe the color of your sputum" (clear, white, yellow, green)     Denied coughing up mucus 6. HEMOPTYSIS: "Are you coughing up any blood?" If so ask: "How much?" (flecks, streaks, tablespoons, etc.)     None  7. CARDIAC HISTORY: "Do you have any history of heart disease?" (e.g., heart attack, congestive heart failure)      None 8. LUNG HISTORY: "Do you have any history of lung disease?"  (e.g., pulmonary embolus, asthma, emphysema)    None 9. PE RISK FACTORS: "Do you have a history of blood clots?" (or: recent major surgery, recent prolonged travel, bedridden )     No 10. OTHER SYMPTOMS: "Do you have any other symptoms?" (e.g., runny nose, wheezing, chest pain)       C/o nasal congestion; denied wheezing, denied chest pain, denied earache or sinus pressure.  11. PREGNANCY: "Is there any chance you are pregnant?"  "When was your last menstrual period?"       N/a 12. TRAVEL: "Have you traveled out of the country in the last month?" (e.g., travel history, exposures)       no  Protocols used: [place]

## 2017-09-01 ENCOUNTER — Encounter: Payer: Self-pay | Admitting: Internal Medicine

## 2017-09-01 ENCOUNTER — Ambulatory Visit (INDEPENDENT_AMBULATORY_CARE_PROVIDER_SITE_OTHER): Payer: Medicare Other | Admitting: Internal Medicine

## 2017-09-01 VITALS — BP 108/64 | HR 64 | Temp 98.3°F | Resp 15 | Ht 68.0 in | Wt 218.1 lb

## 2017-09-01 DIAGNOSIS — R197 Diarrhea, unspecified: Secondary | ICD-10-CM

## 2017-09-01 DIAGNOSIS — R748 Abnormal levels of other serum enzymes: Secondary | ICD-10-CM

## 2017-09-01 DIAGNOSIS — R6889 Other general symptoms and signs: Secondary | ICD-10-CM

## 2017-09-01 LAB — CBC WITH DIFFERENTIAL/PLATELET
BASOS ABS: 0.1 10*3/uL (ref 0.0–0.1)
Basophils Relative: 0.6 % (ref 0.0–3.0)
EOS ABS: 0.1 10*3/uL (ref 0.0–0.7)
Eosinophils Relative: 0.8 % (ref 0.0–5.0)
HEMATOCRIT: 41.8 % (ref 39.0–52.0)
Hemoglobin: 14.3 g/dL (ref 13.0–17.0)
Lymphocytes Relative: 12.2 % (ref 12.0–46.0)
Lymphs Abs: 1.1 10*3/uL (ref 0.7–4.0)
MCHC: 34.1 g/dL (ref 30.0–36.0)
MCV: 94.3 fl (ref 78.0–100.0)
MONO ABS: 0.9 10*3/uL (ref 0.1–1.0)
Monocytes Relative: 10.1 % (ref 3.0–12.0)
NEUTROS ABS: 6.8 10*3/uL (ref 1.4–7.7)
NEUTROS PCT: 76.3 % (ref 43.0–77.0)
PLATELETS: 179 10*3/uL (ref 150.0–400.0)
RBC: 4.44 Mil/uL (ref 4.22–5.81)
RDW: 12.6 % (ref 11.5–15.5)
WBC: 8.9 10*3/uL (ref 4.0–10.5)

## 2017-09-01 LAB — COMPREHENSIVE METABOLIC PANEL
ALBUMIN: 3.7 g/dL (ref 3.5–5.2)
ALT: 37 U/L (ref 0–53)
AST: 45 U/L — AB (ref 0–37)
Alkaline Phosphatase: 81 U/L (ref 39–117)
BILIRUBIN TOTAL: 0.9 mg/dL (ref 0.2–1.2)
BUN: 30 mg/dL — AB (ref 6–23)
CALCIUM: 8.8 mg/dL (ref 8.4–10.5)
CO2: 31 meq/L (ref 19–32)
CREATININE: 0.95 mg/dL (ref 0.40–1.50)
Chloride: 103 mEq/L (ref 96–112)
GFR: 79.48 mL/min (ref >60.00)
Glucose, Bld: 80 mg/dL (ref 70–99)
Potassium: 3.8 mEq/L (ref 3.5–5.1)
SODIUM: 139 meq/L (ref 135–145)
Total Protein: 6.3 g/dL (ref 6.0–8.3)

## 2017-09-01 LAB — SEDIMENTATION RATE: SED RATE: 19 mm/h (ref 0–20)

## 2017-09-01 LAB — POCT INFLUENZA A/B
INFLUENZA B, POC: NEGATIVE
Influenza A, POC: NEGATIVE

## 2017-09-01 NOTE — Patient Instructions (Addendum)
TAKE OUT THE Bystolic (nebivolol) and the furosemide from his pill box for the next 4 days    Get rid of any sugar free gatorade and all artificial sweeteners  While he is trying to hydrate and recover  from diarrhea.   Clear liquid diet until stools are not watery  Advance to BRAT diet once stools are not watery (NO DAIRY,  LETTUCE OR GREASE, NO SPICE)   Can use immodium  1 tablet after each loose stool    Stool tests ordered    Diarrhea, Adult Diarrhea is frequent loose and watery bowel movements. Diarrhea can make you feel weak and cause you to become dehydrated. Dehydration can make you tired and thirsty, cause you to have a dry mouth, and decrease how often you urinate. Diarrhea typically lasts 2-3 days. However, it can last longer if it is a sign of something more serious. It is important to treat your diarrhea as told by your health care provider. Follow these instructions at home: Eating and drinking  Follow these recommendations as told by your health care provider:  Take an oral rehydration solution (ORS). This is a drink that is sold at pharmacies and retail stores.  Drink clear fluids, such as water, ice chips, diluted fruit juice, and low-calorie sports drinks.  Eat bland, easy-to-digest foods in small amounts as you are able. These foods include bananas, applesauce, rice, lean meats, toast, and crackers.  Avoid drinking fluids that contain a lot of sugar or caffeine, such as energy drinks, sports drinks, and soda.  Avoid alcohol.  Avoid spicy or fatty foods.  General instructions  Drink enough fluid to keep your urine clear or pale yellow.  Wash your hands often. If soap and water are not available, use hand sanitizer.  Make sure that all people in your household wash their hands well and often.  Take over-the-counter and prescription medicines only as told by your health care provider.  Rest at home while you recover.  Watch your condition for any  changes.  Take a warm bath to relieve any burning or pain from frequent diarrhea episodes.  Keep all follow-up visits as told by your health care provider. This is important. Contact a health care provider if:  You have a fever.  Your diarrhea gets worse.  You have new symptoms.  You cannot keep fluids down.  You feel light-headed or dizzy.  You have a headache  You have muscle cramps. Get help right away if:  You have chest pain.  You feel extremely weak or you faint.  You have bloody or black stools or stools that look like tar.  You have severe pain, cramping, or bloating in your abdomen.  You have trouble breathing or you are breathing very quickly.  Your heart is beating very quickly.  Your skin feels cold and clammy.  You feel confused.  You have signs of dehydration, such as: ? Dark urine, very little urine, or no urine. ? Cracked lips. ? Dry mouth. ? Sunken eyes. ? Sleepiness. ? Weakness. This information is not intended to replace advice given to you by your health care provider. Make sure you discuss any questions you have with your health care provider. Document Released: 05/27/2002 Document Revised: 10/15/2015 Document Reviewed: 02/10/2015 Elsevier Interactive Patient Education  [employer].

## 2017-09-01 NOTE — Progress Notes (Signed)
Subjective:  Patient ID: Christopher Jenkins, male    DOB: 05/02/1927  Age: 84 y.o. MRN: [id number]  CC: There were no encounter diagnoses.  HPI Christopher Jenkins presents for  Evaluation of diarrhea. Patient is accompanied by his son wo is visiting from [place] and provides most of the detail of present illness.  The diarrhea has been occurring for less than 48 hours and is not accompanied by fevers,  Chills,  Abdominal pain, or nausea..     For the past  He is very fatigued today, and has had 9 loose stools.  .  Symptoms started with fatigue Wednesday night after eating dinner at home.  He was uncharacterstically tired and went to bed a 7 pm.  Woke up several hours later with fecal urgency and diarrhea .  He has been trying to stay hydrated with [place].  Has no simplified his diet yet.    Son states that Christopher Jenkins (patient's wife) reported fatigue and chills yesterday,  But no change in bowel hbits and no nausea.  Son states that he  had a loose stool this morning.     Patient denies any recent travel, and has not eaten out at a restaurant lately.  He denies any recent use of antibiotics     Outpatient Medications Prior to Visit  Medication Sig Dispense Refill  . aspirin 325 MG tablet aspirin 325 mg tablet,delayed release  TAKE 1 TABLET (325 MG TOTAL) BY MOUTH DAILY.    Marland Kitchen atorvastatin (LIPITOR) 40 MG tablet TAKE 1 TABLET (40 MG TOTAL) BY MOUTH DAILY. 90 tablet 3  . Calcium Carbonate-Vitamin D (CALCIUM-VITAMIN D) 600-125 MG-UNIT TABS Take 1 tablet by mouth daily at 2 PM. Reported on 09/02/2015    . clopidogrel (PLAVIX) 75 MG tablet TAKE 1 TABLET (75 MG TOTAL) BY MOUTH DAILY. 90 tablet 3  . Docusate Sodium (DIOCTO) 150 MG/15ML syrup PLACE 3 DROPS IN EACH EAR DAILY TO PREVENT OCCLUSION WITH WAX 100 mL 0  . finasteride (PROSCAR) 5 MG tablet Take 1 tablet (5 mg total) by mouth daily. 90 tablet 3  . fish oil-omega-3 fatty acids 1000 MG capsule Take 1 g by mouth daily. Reported  on 09/07/2015    . furosemide (LASIX) 20 MG tablet TAKE 1 TABLET (20 MG TOTAL) BY MOUTH DAILY. IN THE MORNING 30 tablet 2  . KLOR-CON M20 20 MEQ tablet TAKE 1 TABLET (20 MEQ TOTAL) BY MOUTH DAILY. 30 tablet 3  . meloxicam (MOBIC) 7.5 MG tablet TAKE 1 TABLET (7.5 MG TOTAL) BY MOUTH DAILY. 30 tablet 5  . memantine (NAMENDA) 10 MG tablet TAKE 1 TABLET (10 MG TOTAL) BY MOUTH 2 (TWO) TIMES DAILY. 180 tablet 1  . Multiple Vitamins-Minerals (MULTIVITAMIN WITH MINERALS) tablet Take 1 tablet by mouth daily. At 1400    . nebivolol (BYSTOLIC) 2.5 MG tablet Bystolic 2.5 mg tablet    . niacin 250 MG tablet Take 250 mg by mouth daily. At 1400    . traMADol (ULTRAM) 50 MG tablet tramadol 50 mg tablet     No facility-administered medications prior to visit.     Review of Systems;  Patient denies headache, fevers, malaise, unintentional weight loss, skin rash, eye pain, sinus congestion and sinus pain, sore throat, dysphagia,  hemoptysis , cough, dyspnea, wheezing, chest pain, palpitations, orthopnea, edema, abdominal pain, nausea, melena, diarrhea, constipation, flank pain, dysuria, hematuria, urinary  Frequency, nocturia, numbness, tingling, seizures,  Focal weakness, Loss of consciousness,  Tremor, insomnia,  depression, anxiety, and suicidal ideation.      Objective:  BP 108/64 (BP Location: Right Arm, Patient Position: Sitting, Cuff Size: Large)   Pulse 64   Temp 98.3 F (36.8 C) (Oral)   Resp 15   Ht 5\' 8"  (1.727 m)   Wt 218 lb 1.9 oz (98.9 kg)   SpO2 94%   BMI 33.17 kg/m   BP Readings from Last 3 Encounters:  09/01/17 108/64  04/10/17 130/72  03/09/17 125/69    Wt Readings from Last 3 Encounters:  09/01/17 218 lb 1.9 oz (98.9 kg)  04/10/17 219 lb 9.6 oz (99.6 kg)  03/09/17 214 lb 8 oz (97.3 kg)    General appearance: alert, cooperative and appears stated age Ears: normal TM's and external ear canals both ears Throat: lips, mucosa, and tongue normal; teeth and gums normal Neck: no  adenopathy, no carotid bruit, supple, symmetrical, trachea midline and thyroid not enlarged, symmetric, no tenderness/mass/nodules Back: symmetric, no curvature. ROM normal. No CVA tenderness. Lungs: clear to auscultation bilaterally Heart: regular rate and rhythm, S1, S2 normal, no murmur, click, rub or gallop Abdomen: soft, non-tender; bowel sounds normal; no masses,  no organomegaly Pulses: 2+ and symmetric Skin: Skin color, texture, turgor normal. No rashes or lesions Lymph nodes: Cervical, supraclavicular, and axillary nodes normal.  Lab Results  Component Value Date   HGBA1C 5.6 10/06/2016    Lab Results  Component Value Date   CREATININE 0.88 04/05/2017   CREATININE 0.89 10/06/2016   CREATININE 1.00 08/04/2016    Lab Results  Component Value Date   WBC 7.6 04/05/2017   HGB 15.6 04/05/2017   HCT 46.1 04/05/2017   PLT 189.0 04/05/2017   GLUCOSE 101 (H) 04/05/2017   CHOL 125 04/05/2017   TRIG 145.0 04/05/2017   HDL 50.10 04/05/2017   LDLDIRECT 53.0 04/05/2017   LDLCALC 46 04/05/2017   ALT 15 04/05/2017   AST 22 04/05/2017   NA 142 04/05/2017   K 3.7 04/05/2017   CL 101 04/05/2017   CREATININE 0.88 04/05/2017   BUN 18 04/05/2017   CO2 29 04/05/2017   TSH 1.98 04/05/2017   INR 1.1 (H) 04/14/2016   HGBA1C 5.6 10/06/2016   MICROALBUR 2.2 (H) 10/06/2016    Ct Abd Wo & W Cm  Result Date: 05/16/2017 CLINICAL DATA:  Bosniak category 64F cystic lesion of the left kidney lower pole. Surveillance imaging. EXAM: CT ABDOMEN WITHOUT AND WITH CONTRAST TECHNIQUE: Multidetector CT imaging of the abdomen was performed following the standard protocol before and following the bolus administration of intravenous contrast. CONTRAST:  148mL ISOVUE-370 IOPAMIDOL (ISOVUE-370) INJECTION 76% COMPARISON:  Multiple exams, including 08/04/2016 FINDINGS: Lower chest: There is mild atelectasis along both hemidiaphragms. Coronary atherosclerosis with mild aortic valve calcification. Small type 1  hiatal hernia. Hepatobiliary: Several small stable hypodense lesions in the left hepatic lobe are likely cysts although technically nonspecific due to small size. Gallbladder unremarkable. Pancreas: Unremarkable Spleen: Unremarkable Adrenals/Urinary Tract: Unremarkable appearance of the adrenal glands. Similar appearance of bilateral renal cysts. The left kidney lower pole lesion has a simple 5.1 by 4.6 cm component which is partially exophytic, and also a precontrast high density 1.2 by 1.1 by 1.3 cm (previously 1.1 by 1.1 by 1.3 cm) adjacent complex cyst or hyperdense lesion with indeterminate enhancement based on the orientation and size the lesion. There is some small rim like enhancement between the bottom margin of this lesion and the larger cyst on image 60/ 10 but this could be from a  small rim of intervening normal renal parenchyma. This lesion remains Bosniak category 2 F, without appreciable growth since 08/04/2016, although the complex component does appear to of likely enlarged compared to 2013. No upper abdominal renal calculi are observed. Stomach/Bowel: Transverse duodenal diverticulum. Descending colon and sigmoid colon diverticulosis. Vascular/Lymphatic: Aortoiliac atherosclerotic vascular disease. No pathologic adenopathy identified. Other: No supplemental non-categorized findings. Musculoskeletal: Lumbar spondylosis and degenerative disc disease causing multilevel impingement most striking at the L4-5 level. Small umbilical hernia contains adipose tissue. IMPRESSION: 1. Stable appearance of the left kidney lower pole Bosniak category 74F cyst compared to the prior exam. Enhancement of the complex component is indeterminate although the lesion is considered probably benign. Renal protocol CT scan with and without contrast in 1 years time, or renal MRI, is recommended for surveillance. 2. Other imaging findings of potential clinical significance: Aortic Atherosclerosis (ICD10-I70.0). Coronary  atherosclerosis. Small type 1 hiatal hernia. Transverse duodenal diverticulum. Descending and sigmoid colon diverticulosis. Lumbar impingement due to spondylosis and degenerative disc disease. Small hepatic cysts. Electronically Signed   By: Van Clines M.D.   On: 05/16/2017 08:18    Assessment & Plan:   Problem List Items Addressed This Visit    None      I am having Christopher Jenkins maintain his niacin, fish oil-omega-3 fatty acids, multivitamin with minerals, Calcium-Vitamin D, clopidogrel, atorvastatin, nebivolol, traMADol, aspirin, finasteride, furosemide, meloxicam, Docusate Sodium, KLOR-CON M20, and memantine.  No orders of the defined types were placed in this encounter.   There are no discontinued medications.  Follow-up: No Follow-up on file.   [name], MD

## 2017-09-03 DIAGNOSIS — R197 Diarrhea, unspecified: Secondary | ICD-10-CM | POA: Insufficient documentation

## 2017-09-03 DIAGNOSIS — R748 Abnormal levels of other serum enzymes: Secondary | ICD-10-CM | POA: Insufficient documentation

## 2017-09-03 NOTE — Assessment & Plan Note (Signed)
Stool studies ordered,  Contact precautions outlined.  Clear liquid diet.  Previous CT notes the presence of sigmoid diverticulosis.  But he has NO signs of diverticulitis by exam today or by labs drawn today .

## 2017-09-03 NOTE — Assessment & Plan Note (Signed)
Likely secondary to illness, although he does have NAFLD.  Will recheck next week along with Hep A screen

## 2017-09-12 ENCOUNTER — Other Ambulatory Visit (INDEPENDENT_AMBULATORY_CARE_PROVIDER_SITE_OTHER): Payer: Medicare Other

## 2017-09-12 DIAGNOSIS — R748 Abnormal levels of other serum enzymes: Secondary | ICD-10-CM

## 2017-09-12 NOTE — Addendum Note (Signed)
Addended by: Arby Barrette on: 09/12/2017 11:30 AM   Modules accepted: Orders

## 2017-09-13 LAB — HEPATIC FUNCTION PANEL
AG Ratio: 1.7 (calc) (ref 1.0–2.5)
ALBUMIN MSPROF: 3.7 g/dL (ref 3.6–5.1)
ALT: 18 U/L (ref 9–46)
AST: 19 U/L (ref 10–35)
Alkaline phosphatase (APISO): 102 U/L (ref 40–115)
BILIRUBIN DIRECT: 0.1 mg/dL (ref 0.0–0.2)
GLOBULIN: 2.2 g/dL (ref 1.9–3.7)
Indirect Bilirubin: 0.5 mg/dL (calc) (ref 0.2–1.2)
Total Bilirubin: 0.6 mg/dL (ref 0.2–1.2)
Total Protein: 5.9 g/dL — ABNORMAL LOW (ref 6.1–8.1)

## 2017-09-13 LAB — HEPATITIS A ANTIBODY, IGM: Hep A IgM: NONREACTIVE

## 2017-09-14 ENCOUNTER — Encounter: Payer: Self-pay | Admitting: Internal Medicine

## 2017-09-14 ENCOUNTER — Telehealth: Payer: Self-pay | Admitting: Internal Medicine

## 2017-09-14 NOTE — Telephone Encounter (Signed)
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 Pt is at routine cleaning and did not bring required med list. Information provided with pt's permission and presence.

## 2017-09-18 NOTE — Progress Notes (Signed)
3:41 PM   Christopher Jenkins Jan 25, 1927 [id number]  Referring provider: [name], MD [place], [place] [address]  Chief Complaint  Patient presents with  . Hematuria  . Benign Prostatic Hypertrophy    HPI: Patient is an 84 year old Caucasian male with a history of hematuria, a Bosniak 47F cyst and BPH with LU TS who presents today for nocturia.     Nocturia A few weeks ago he started to have nocturia x 4.  Patient denies any gross hematuria, dysuria or suprapubic/flank pain.  Patient denies any fevers, chills, nausea or vomiting.  His UA is unremarkable.  His PVR is 172 mL.  He has been diagnosed with sleep apnea.  He states that he has been sleeping with his machine.    History of hematuria He has not had any further episodes of gross hematuria since his last visit with [place].  He has underwent CT Urograms in 2017 and 07/2016.  He was found to have a Bosniak 47F on the CT in 07/2015 and it remained unchanged on 07/2016 CTU.  Cystoscopy was negative for malignancies.  He has not had any gross hematuria.      Bosniak 47F cyst CT Urogram completed on 08/05/2015 noted a Bosniak category 47F lesion arising from the inferior pole of the left kidney is again noted and is unchanged on 04/2017 exam measuring 11 mm.  Patient did not remember having this lesion or the imaging studies in the past.  He is due for follow up imaging in 04/2018.  BPH WITH LUTS His IPSS score today is 9, which is moderate lower urinary tract symptomatology. He is mostly satisfied with his quality life due to his urinary symptoms.  His previous I PSS score was 1/0.  His major complaint today is nocturia x 4.  He has had these symptoms for a few weeks.   He denies any dysuria, hematuria or suprapubic pain.  He currently taking finasteride 5 mg daily.  He also denies any recent fevers, chills, nausea or vomiting.  He does not have a family history of PCa.  IPSS    Row Name 09/19/17 1500         International Prostate Symptom Score   How often have you had the sensation of not emptying your bladder?  Not at All     How often have you had to urinate less than every two hours?  Not at All     How often have you found you stopped and started again several times when you urinated?  Not at All     How often have you found it difficult to postpone urination?  Less than half the time     How often have you had a weak urinary stream?  Less than half the time     How often have you had to strain to start urination?  Less than 1 in 5 times     How many times did you typically get up at night to urinate?  4 Times     Total IPSS Score  9       Quality of Life due to urinary symptoms   If you were to spend the rest of your life with your urinary condition just the way it is now how would you feel about that?  Mostly Satisfied        Score:  1-7 Mild 8-19 Moderate 20-35 Severe    PMH: Past Medical  History:  Diagnosis Date  . BPH (benign prostatic hyperplasia)   . Duodenal mass   . Elevated PSA   . Gross hematuria   . Hyperlipidemia   . Hypertension   . Hypertrophy (benign) of prostate    asymptomatic on finasteride. will f/up with urologist [name]  . Kidney lesion 07/2015   pt has appt at kidney specialist 08/05/15 to review CT scan.  . Obesity   . Overweight   . Tubular adenoma of colon   . Urinary retention     Surgical History: Past Surgical History:  Procedure Laterality Date  . APPENDECTOMY    . cataracts    . right knee surgery Right 12-17/2017  . TOTAL HIP ARTHROPLASTY Right 08/13/2015   Procedure: TOTAL HIP ARTHROPLASTY;  Surgeon: [name], MD;  Location: [facility] ORS;  Service: Orthopedics;  Laterality: Right;  . Uvula Surgery  1990    Home Medications:  Allergies as of 09/19/2017      Reactions   No Known Allergies       Medication List        Accurate as of 09/19/17  3:41 PM. Always use your most recent med list.          aspirin 325 MG  tablet aspirin 325 mg tablet,delayed release  TAKE 1 TABLET (325 MG TOTAL) BY MOUTH DAILY.   atorvastatin 40 MG tablet Commonly known as:  LIPITOR TAKE 1 TABLET (40 MG TOTAL) BY MOUTH DAILY.   BYSTOLIC 2.5 MG tablet Generic drug:  nebivolol Bystolic 2.5 mg tablet   Calcium-Vitamin D 600-125 MG-UNIT Tabs Take 1 tablet by mouth daily at 2 PM. Reported on 09/02/2015   clopidogrel 75 MG tablet Commonly known as:  PLAVIX TAKE 1 TABLET (75 MG TOTAL) BY MOUTH DAILY.   Docusate Sodium 150 MG/15ML syrup Commonly known as:  DIOCTO PLACE 3 DROPS IN EACH EAR DAILY TO PREVENT OCCLUSION WITH WAX   finasteride 5 MG tablet Commonly known as:  PROSCAR Take 1 tablet (5 mg total) by mouth daily.   fish oil-omega-3 fatty acids 1000 MG capsule Take 1 g by mouth daily. Reported on 09/07/2015   furosemide 20 MG tablet Commonly known as:  LASIX TAKE 1 TABLET (20 MG TOTAL) BY MOUTH DAILY. IN THE MORNING   KLOR-CON M20 20 MEQ tablet Generic drug:  potassium chloride SA TAKE 1 TABLET (20 MEQ TOTAL) BY MOUTH DAILY.   meloxicam 7.5 MG tablet Commonly known as:  MOBIC TAKE 1 TABLET (7.5 MG TOTAL) BY MOUTH DAILY.   memantine 10 MG tablet Commonly known as:  NAMENDA TAKE 1 TABLET (10 MG TOTAL) BY MOUTH 2 (TWO) TIMES DAILY.   multivitamin with minerals tablet Take 1 tablet by mouth daily. At 1400   niacin 250 MG tablet Take 250 mg by mouth daily. At 1400   tamsulosin 0.4 MG Caps capsule Commonly known as:  FLOMAX Take 1 capsule (0.4 mg total) by mouth daily.   traMADol 50 MG tablet Commonly known as:  ULTRAM tramadol 50 mg tablet       Allergies:  Allergies  Allergen Reactions  . No Known Allergies     Family History: Family History  Problem Relation Age of Onset  . Heart disease Mother   . Stroke Mother   . Heart disease Father   . Kidney disease Neg Hx   . Prostate cancer Neg Hx   . Kidney cancer Neg Hx   . Bladder Cancer Neg Hx     Social History:  reports that he has  never smoked. He has never used smokeless tobacco. He reports that he does not drink alcohol or use drugs.  ROS: UROLOGY Frequent Urination?: Yes Hard to postpone urination?: No Burning/pain with urination?: No Get up at night to urinate?: Yes Leakage of urine?: No Urine stream starts and stops?: No Trouble starting stream?: No Do you have to strain to urinate?: No Blood in urine?: No Urinary tract infection?: No Sexually transmitted disease?: No Injury to kidneys or bladder?: No Painful intercourse?: No Weak stream?: No Erection problems?: No Penile pain?: No  Gastrointestinal Nausea?: No Vomiting?: No Indigestion/heartburn?: No Diarrhea?: No Constipation?: No  Constitutional Fever: No Night sweats?: No Weight loss?: No Fatigue?: No  Skin Skin rash/lesions?: No Itching?: No  Eyes Blurred vision?: No Double vision?: No  Ears/Nose/Throat Sore throat?: No Sinus problems?: No  Hematologic/Lymphatic Swollen glands?: No Easy bruising?: No  Cardiovascular Leg swelling?: No Chest pain?: No  Respiratory Cough?: No Shortness of breath?: No  Endocrine Excessive thirst?: No  Musculoskeletal Back pain?: No Joint pain?: No  Neurological Headaches?: No Dizziness?: No  Psychologic Depression?: No Anxiety?: No  Physical Exam: BP 136/72 (BP Location: Right Arm, Patient Position: Sitting, Cuff Size: Large)   Pulse 73   Ht 5\' 8"  (1.727 m)   Wt 217 lb 3.2 oz (98.5 kg)   BMI 33.03 kg/m   Constitutional: Well nourished. Alert and oriented, No acute distress. HEENT: [place] AT, moist mucus membranes. Trachea midline, no masses. Cardiovascular: No clubbing, cyanosis, or edema. Respiratory: Normal respiratory effort, no increased work of breathing. Skin: No rashes, bruises or suspicious lesions. Neurologic: Grossly intact, no focal deficits, moving all 4 extremities. Psychiatric: Normal mood and affect.  Laboratory Data: Lab Results  Component Value Date     WBC 8.9 09/01/2017   HGB 14.3 09/01/2017   HCT 41.8 09/01/2017   MCV 94.3 09/01/2017   PLT 179.0 09/01/2017    Lab Results  Component Value Date   CREATININE 0.95 09/01/2017   I have reviewed the labs  Pertinent Imaging Results for orders placed or performed in visit on 09/19/17  Bladder Scan (Post Void Residual) in office  Result Value Ref Range   Scan Result 172       Assessment & Plan:    1. Nocturia Patient states that he is sleeping with a CPAP machine He is not a candidate for DDAVP therapy as he has a history of hyponatremia His PVR is somewhat moderate, therefore we will initiate tamsulosin to see if we can assist and better bladder emptying to decrease his night time voids I have suggested that the patient avoid caffeine after noon and alcohol in the evening.  He or she may also benefit from fluid restrictions after 6:00 in the evening and voiding just prior to bedtime. RTC in one month for I PSS and PVR  2. History of hematuria  - Completed a hematuria workup with CT urograms and cystoscopy in December 2016 - no malignancies found  3. Left Bosniak 24F cyst  - due for follow up imaging in 04/2018  4. BPH with LUTS  - IPSS score is 1/1  - Continue conservative management, avoiding bladder irritants and timed voiding's  - most bothersome symptom is nocturia x 4  - Continue finasteride 5 mg daily   - RTC in 1 month for IPSS and PVR  Return in about 1 month (around 10/19/2017) for IPSS and PVR.  Zara Council, [facility] [address]  [place], [place] [address] [phone]  Addendum: Staff came to me after the patient had left and stated that the table paper was soaked with urine where the patient was sitting.  Patient did not expressed to staff that he had an episode of incontinence and staff is concerned that his dementia is worsening and he may not be aware of the incontinence.  Will need to address further at  his follow up appointment.

## 2017-09-19 ENCOUNTER — Ambulatory Visit (INDEPENDENT_AMBULATORY_CARE_PROVIDER_SITE_OTHER): Payer: Medicare Other | Admitting: Urology

## 2017-09-19 ENCOUNTER — Encounter: Payer: Self-pay | Admitting: Urology

## 2017-09-19 VITALS — BP 136/72 | HR 73 | Ht 68.0 in | Wt 217.2 lb

## 2017-09-19 DIAGNOSIS — N401 Enlarged prostate with lower urinary tract symptoms: Secondary | ICD-10-CM | POA: Diagnosis not present

## 2017-09-19 DIAGNOSIS — R351 Nocturia: Secondary | ICD-10-CM | POA: Diagnosis not present

## 2017-09-19 DIAGNOSIS — N281 Cyst of kidney, acquired: Secondary | ICD-10-CM | POA: Diagnosis not present

## 2017-09-19 DIAGNOSIS — N138 Other obstructive and reflux uropathy: Secondary | ICD-10-CM | POA: Diagnosis not present

## 2017-09-19 DIAGNOSIS — Z87448 Personal history of other diseases of urinary system: Secondary | ICD-10-CM | POA: Diagnosis not present

## 2017-09-19 LAB — URINALYSIS, COMPLETE
Bilirubin, UA: NEGATIVE
GLUCOSE, UA: NEGATIVE
KETONES UA: NEGATIVE
NITRITE UA: NEGATIVE
Protein, UA: NEGATIVE
SPEC GRAV UA: 1.02 (ref 1.005–1.030)
Urobilinogen, Ur: 0.2 mg/dL (ref 0.2–1.0)
pH, UA: 5.5 (ref 5.0–7.5)

## 2017-09-19 LAB — MICROSCOPIC EXAMINATION
Epithelial Cells (non renal): NONE SEEN /hpf (ref 0–10)
RBC, UA: NONE SEEN /hpf (ref 0–2)

## 2017-09-19 LAB — BLADDER SCAN AMB NON-IMAGING: SCAN RESULT: 172

## 2017-09-19 MED ORDER — TAMSULOSIN HCL 0.4 MG PO CAPS: 0.4000 mg | ORAL_CAPSULE | Freq: Every day | ORAL | Status: DC

## 2017-09-19 NOTE — Telephone Encounter (Signed)
This encounter was created in error - please disregard.

## 2017-09-19 NOTE — Patient Instructions (Signed)
Nocturia  - I explained to the patient that nocturia is often multi-factorial and difficult to treat.  Sleeping disorders, heart conditions, peripheral vascular disease, diabetes, an enlarged prostate for men, an urethral stricture causing bladder outlet obstruction and/or certain medications can contribute to nocturia.  - I have suggested that the patient avoid caffeine after noon and alcohol in the evening.  He or she may also benefit from fluid restrictions after 6:00 in the evening and voiding just prior to bedtime.  - I have explained that research studies have showed that over 84% of patients with sleep apnea reported frequent nighttime urination.   With sleep apnea, oxygen decreases, carbon dioxide increases, the blood become more acidic, the heart rate drops and blood vessels in the lung constrict.  The body is then alerted that something is very wrong. The sleeper must wake enough to reopen the airway. By this time, the heart is racing and experiences a false signal of fluid overload. The heart excretes a hormone-like protein that tells the body to get rid of sodium and water, resulting in nocturia.  -  I also informed the patient that a recent study noted that decreasing sodium intake to 2.3 grams daily, if they don't have issues with hyponatremia, can also reduce the number of nightly voids  - The patient may benefit from a discussion with his or her primary care physician to see if he or she has risk factors for sleep apnea or other sleep disturbances and obtaining a sleep study.

## 2017-10-08 ENCOUNTER — Other Ambulatory Visit: Payer: Self-pay | Admitting: Internal Medicine

## 2017-10-09 ENCOUNTER — Encounter: Payer: Self-pay | Admitting: Internal Medicine

## 2017-10-09 ENCOUNTER — Ambulatory Visit (INDEPENDENT_AMBULATORY_CARE_PROVIDER_SITE_OTHER): Payer: Medicare Other | Admitting: Internal Medicine

## 2017-10-09 DIAGNOSIS — N138 Other obstructive and reflux uropathy: Secondary | ICD-10-CM

## 2017-10-09 DIAGNOSIS — E7800 Pure hypercholesterolemia, unspecified: Secondary | ICD-10-CM | POA: Diagnosis not present

## 2017-10-09 DIAGNOSIS — F0390 Unspecified dementia without behavioral disturbance: Secondary | ICD-10-CM

## 2017-10-09 DIAGNOSIS — E669 Obesity, unspecified: Secondary | ICD-10-CM

## 2017-10-09 DIAGNOSIS — I10 Essential (primary) hypertension: Secondary | ICD-10-CM | POA: Diagnosis not present

## 2017-10-09 DIAGNOSIS — N401 Enlarged prostate with lower urinary tract symptoms: Secondary | ICD-10-CM | POA: Diagnosis not present

## 2017-10-09 NOTE — Patient Instructions (Addendum)
Your blood pressure is elevated today.  It may be time to adjust your medication,  But FIRST.  Please STOP  MOBIC (meloxicam) on a daily basis,  Since you are not having joint pain.  Because this can raise blood pressure   THEN Please ask the RN at [facility]  check your blood pressure daily for 3 day  and send me the  Readings    So I can adjust your BYSTOLIC  if needed  Continue both medications for your bladder and prostate:  Flomax and Proscar    You have gained 5 lbs !   Stop eating ice cream every night!      Your memory is still a problem.  Talk to [place] about becoming your"power of attorney" for healthcare and

## 2017-10-09 NOTE — Progress Notes (Signed)
Subjective:  Patient ID: Christopher Jenkins, male    DOB: [birth year]  Age: 84 y.o. MRN: [id number]  CC: Diagnoses of Obesity (BMI 30-39.9), Dementia arising in the senium and presenium, BPH with obstruction/lower urinary tract symptoms, Pure hypercholesterolemia, and Essential hypertension were pertinent to this visit.  HPI CON ARGANBRIGHT presents for follow up on hyperlipidemia , hypertension,  OSA,prediabetes and vasc ular dementia  Last  Seen in March for infectious diarrhea which resolved spontaneously , but not  Before his visiting son became infected with it.  Had urology follow up April 9 for [facility] with LUTs and hematuria . On 2 medications now (flomax and Prosar   bp elevated today  Reviewed medications: takes bystolic at noon   Does not check blood pressure  Ever.  takes Mobic but not  sure how often . Using a pill box  having some difficulty managing timely initiation of refill request,  States that he gets frustrated when he requests refills and  pharmacy states that it is not authorized.   OSA: wearing his CPAP nightly   Obesity:  laying doubles tennis1-2/week . walks a mile every morning. Indulges in eating ice cream every night   DEMENTIA:   Incontinence noted at urology visit but not acknowledged by patient  At visit.  states that symptoms have improved   Outpatient Medications Prior to Visit  Medication Sig Dispense Refill  . aspirin 325 MG tablet aspirin 325 mg tablet,delayed release  TAKE 1 TABLET (325 MG TOTAL) BY MOUTH DAILY.    Marland Kitchen atorvastatin (LIPITOR) 40 MG tablet TAKE 1 TABLET (40 MG TOTAL) BY MOUTH DAILY. 90 tablet 3  . Calcium Carbonate-Vitamin D (CALCIUM-VITAMIN D) 600-125 MG-UNIT TABS Take 1 tablet by mouth daily at 2 PM. Reported on 09/02/2015    . clopidogrel (PLAVIX) 75 MG tablet TAKE 1 TABLET (75 MG TOTAL) BY MOUTH DAILY. 90 tablet 3  . Docusate Sodium (DIOCTO) 150 MG/15ML syrup PLACE 3 DROPS IN EACH EAR DAILY TO PREVENT OCCLUSION WITH WAX 100 mL 0  .  finasteride (PROSCAR) 5 MG tablet Take 1 tablet (5 mg total) by mouth daily. 90 tablet 3  . fish oil-omega-3 fatty acids 1000 MG capsule Take 1 g by mouth daily. Reported on 09/07/2015    . furosemide (LASIX) 20 MG tablet TAKE 1 TABLET (20 MG TOTAL) BY MOUTH DAILY. IN THE MORNING 30 tablet 2  . KLOR-CON M20 20 MEQ tablet TAKE 1 TABLET (20 MEQ TOTAL) BY MOUTH DAILY. 30 tablet 3  . meloxicam (MOBIC) 7.5 MG tablet TAKE 1 TABLET (7.5 MG TOTAL) BY MOUTH DAILY. 30 tablet 5  . memantine (NAMENDA) 10 MG tablet TAKE 1 TABLET (10 MG TOTAL) BY MOUTH 2 (TWO) TIMES DAILY. 180 tablet 1  . Multiple Vitamins-Minerals (MULTIVITAMIN WITH MINERALS) tablet Take 1 tablet by mouth daily. At 1400    . nebivolol (BYSTOLIC) 2.5 MG tablet Bystolic 2.5 mg tablet    . niacin 250 MG tablet Take 250 mg by mouth daily. At 1400    . tamsulosin (FLOMAX) 0.4 MG CAPS capsule Take 1 capsule (0.4 mg total) by mouth daily. 30 capsule 03  . traMADol (ULTRAM) 50 MG tablet tramadol 50 mg tablet     No facility-administered medications prior to visit.     Review of Systems;  Patient denies headache, fevers, malaise, unintentional weight loss, skin rash, eye pain, sinus congestion and sinus pain, sore throat, dysphagia,  hemoptysis , cough, dyspnea, wheezing, chest pain, palpitations, orthopnea, edema, abdominal pain,  nausea, melena, diarrhea, constipation, flank pain, dysuria, hematuria, urinary  Frequency, nocturia, numbness, tingling, seizures,  Focal weakness, Loss of consciousness,  Tremor, insomnia, depression, anxiety, and suicidal ideation.      Objective:  BP (!) 160/68 (BP Location: Left Arm, Patient Position: Sitting, Cuff Size: Normal)   Pulse 74   Temp 98.1 F (36.7 C) (Oral)   Resp 15   Ht 5\' 8"  (1.727 m)   Wt 222 lb 9.6 oz (101 kg)   SpO2 92%   BMI 33.85 kg/m   BP Readings from Last 3 Encounters:  10/09/17 (!) 160/68  09/19/17 136/72  09/01/17 108/64    Wt Readings from Last 3 Encounters:  10/09/17 222 lb  9.6 oz (101 kg)  09/19/17 217 lb 3.2 oz (98.5 kg)  09/01/17 218 lb 1.9 oz (98.9 kg)    General appearance: alert, cooperative and appears stated age Ears: normal TM's and external ear canals both ears Throat: lips, mucosa, and tongue normal; teeth and gums normal Neck: no adenopathy, no carotid bruit, supple, symmetrical, trachea midline and thyroid not enlarged, symmetric, no tenderness/mass/nodules Back: symmetric, no curvature. ROM normal. No CVA tenderness. Lungs: clear to auscultation bilaterally Heart: regular rate and rhythm, S1, S2 normal, no murmur, click, rub or gallop Abdomen: soft, non-tender; bowel sounds normal; no masses,  no organomegaly Pulses: 2+ and symmetric Skin: Skin color, texture, turgor normal. No rashes or lesions Lymph nodes: Cervical, supraclavicular, and axillary nodes normal.  Lab Results  Component Value Date   HGBA1C 5.6 10/06/2016    Lab Results  Component Value Date   CREATININE 0.95 09/01/2017   CREATININE 0.88 04/05/2017   CREATININE 0.89 10/06/2016    Lab Results  Component Value Date   WBC 8.9 09/01/2017   HGB 14.3 09/01/2017   HCT 41.8 09/01/2017   PLT 179.0 09/01/2017   GLUCOSE 80 09/01/2017   CHOL 125 04/05/2017   TRIG 145.0 04/05/2017   HDL 50.10 04/05/2017   LDLDIRECT 53.0 04/05/2017   LDLCALC 46 04/05/2017   ALT 18 09/12/2017   AST 19 09/12/2017   NA 139 09/01/2017   K 3.8 09/01/2017   CL 103 09/01/2017   CREATININE 0.95 09/01/2017   BUN 30 (H) 09/01/2017   CO2 31 09/01/2017   TSH 1.98 04/05/2017   INR 1.1 (H) 04/14/2016   HGBA1C 5.6 10/06/2016   MICROALBUR 2.2 (H) 10/06/2016    Ct Abd Wo & W Cm  Result Date: 05/16/2017 CLINICAL DATA:  Bosniak category 35F cystic lesion of the left kidney lower pole. Surveillance imaging. EXAM: CT ABDOMEN WITHOUT AND WITH CONTRAST TECHNIQUE: Multidetector CT imaging of the abdomen was performed following the standard protocol before and following the bolus administration of  intravenous contrast. CONTRAST:  16mL ISOVUE-370 IOPAMIDOL (ISOVUE-370) INJECTION 76% COMPARISON:  Multiple exams, including 08/04/2016 FINDINGS: Lower chest: There is mild atelectasis along both hemidiaphragms. Coronary atherosclerosis with mild aortic valve calcification. Small type 1 hiatal hernia. Hepatobiliary: Several small stable hypodense lesions in the left hepatic lobe are likely cysts although technically nonspecific due to small size. Gallbladder unremarkable. Pancreas: Unremarkable Spleen: Unremarkable Adrenals/Urinary Tract: Unremarkable appearance of the adrenal glands. Similar appearance of bilateral renal cysts. The left kidney lower pole lesion has a simple 5.1 by 4.6 cm component which is partially exophytic, and also a precontrast high density 1.2 by 1.1 by 1.3 cm (previously 1.1 by 1.1 by 1.3 cm) adjacent complex cyst or hyperdense lesion with indeterminate enhancement based on the orientation and size the lesion. There is  some small rim like enhancement between the bottom margin of this lesion and the larger cyst on image 60/ 10 but this could be from a small rim of intervening normal renal parenchyma. This lesion remains Bosniak category 2 F, without appreciable growth since 08/04/2016, although the complex component does appear to of likely enlarged compared to 2013. No upper abdominal renal calculi are observed. Stomach/Bowel: Transverse duodenal diverticulum. Descending colon and sigmoid colon diverticulosis. Vascular/Lymphatic: Aortoiliac atherosclerotic vascular disease. No pathologic adenopathy identified. Other: No supplemental non-categorized findings. Musculoskeletal: Lumbar spondylosis and degenerative disc disease causing multilevel impingement most striking at the L4-5 level. Small umbilical hernia contains adipose tissue. IMPRESSION: 1. Stable appearance of the left kidney lower pole Bosniak category 45F cyst compared to the prior exam. Enhancement of the complex component is  indeterminate although the lesion is considered probably benign. Renal protocol CT scan with and without contrast in 1 years time, or renal MRI, is recommended for surveillance. 2. Other imaging findings of potential clinical significance: Aortic Atherosclerosis (ICD10-I70.0). Coronary atherosclerosis. Small type 1 hiatal hernia. Transverse duodenal diverticulum. Descending and sigmoid colon diverticulosis. Lumbar impingement due to spondylosis and degenerative disc disease. Small hepatic cysts. Electronically Signed   By: Van Clines M.D.   On: 05/16/2017 08:18    Assessment & Plan:   Problem List Items Addressed This Visit    Obesity (BMI 30-39.9)    I have addressed  BMI and recommended a low glycemic index diet utilizing smaller more frequent meals to increase metabolism.  I have also recommended that patient start exercising with a goal of 30 minutes of aerobic exercise a minimum of 5 days per week.        Hypertension    Not at goal n current bystolic dose. But may be taking meloxicam daily  Recheck bp after stopping NSAID      Hyperlipidemia    Continued use of statin is advised given atherosclerosis of aorta noted on prior CT .  He is tolerating atorvastatin and LFTs are normal.   Lab Results  Component Value Date   CHOL 125 04/05/2017   HDL 50.10 04/05/2017   LDLCALC 46 04/05/2017   LDLDIRECT 53.0 04/05/2017   TRIG 145.0 04/05/2017   CHOLHDL 3 04/05/2017   Lab Results  Component Value Date   ALT 18 09/12/2017   AST 19 09/12/2017   ALKPHOS 81 09/01/2017   BILITOT 0.6 09/12/2017         Dementia arising in the senium and presenium    Continue Namenda. Advised patient to designate one of his sons as POA      BPH with obstruction/lower urinary tract symptoms    With urinary incontinence noted by uroogist at last visit. Patient denies ongoing incontinence, continue flomax and proscar         A total of 25 minutes of face to face time was spent with patient  more than half of which was spent in counselling about the above mentioned conditions  and coordination of care  I am having Christopher Jenkins maintain his niacin, fish oil-omega-3 fatty acids, multivitamin with minerals, Calcium-Vitamin D, clopidogrel, atorvastatin, nebivolol, traMADol, aspirin, finasteride, meloxicam, Docusate Sodium, KLOR-CON M20, memantine, tamsulosin, and furosemide.  No orders of the defined types were placed in this encounter.   There are no discontinued medications.  Follow-up: Return in about 6 months (around 04/10/2018) for cpe.   [name], MD

## 2017-10-10 NOTE — Assessment & Plan Note (Signed)
Continue Namenda. Advised patient to designate one of his sons as POA

## 2017-10-10 NOTE — Assessment & Plan Note (Signed)
Not at goal n current bystolic dose. But may be taking meloxicam daily  Recheck bp after stopping NSAID

## 2017-10-10 NOTE — Assessment & Plan Note (Signed)
With urinary incontinence noted by uroogist at last visit. Patient denies ongoing incontinence, continue flomax and proscar

## 2017-10-10 NOTE — Assessment & Plan Note (Signed)
Continued use of statin is advised given atherosclerosis of aorta noted on prior CT .  He is tolerating atorvastatin and LFTs are normal.   Lab Results  Component Value Date   CHOL 125 04/05/2017   HDL 50.10 04/05/2017   LDLCALC 46 04/05/2017   LDLDIRECT 53.0 04/05/2017   TRIG 145.0 04/05/2017   CHOLHDL 3 04/05/2017   Lab Results  Component Value Date   ALT 18 09/12/2017   AST 19 09/12/2017   ALKPHOS 81 09/01/2017   BILITOT 0.6 09/12/2017

## 2017-10-10 NOTE — Assessment & Plan Note (Signed)
I have addressed  BMI and recommended a low glycemic index diet utilizing smaller more frequent meals to increase metabolism.  I have also recommended that patient start exercising with a goal of 30 minutes of aerobic exercise a minimum of 5 days per week.  

## 2017-10-19 ENCOUNTER — Ambulatory Visit: Payer: Medicare Other | Admitting: Urology

## 2017-10-22 ENCOUNTER — Telehealth: Payer: Self-pay | Admitting: Internal Medicine

## 2017-10-22 DIAGNOSIS — I10 Essential (primary) hypertension: Secondary | ICD-10-CM

## 2017-10-22 NOTE — Telephone Encounter (Signed)
Blood pressure is much betterand nearly at goal. If he needs to resume the meloxicam for pain control,  He can increase the Bystolic to 5 mg daily

## 2017-10-22 NOTE — Assessment & Plan Note (Signed)
.  vs

## 2017-10-23 MED ORDER — NEBIVOLOL HCL 2.5 MG PO TABS: ORAL_TABLET | ORAL | 2 refills | Status: DC

## 2017-10-23 NOTE — Telephone Encounter (Signed)
He needs to be taking bystolic so I will send it to his pharmacy.

## 2017-10-23 NOTE — Telephone Encounter (Signed)
Spoke with pt and to advise him that his blood pressures are doing better and that he can resume taking the meloxicam for and increase the bystolic to 5mg  daily. The pt stated that he does not have the meloxicam and has not been experiencing any pain that he can remember. Pt also stated that he does not have the bystolic 2.5mg  so that makes him think he has not been taking it.

## 2017-10-23 NOTE — Telephone Encounter (Signed)
Spoke with pt and informed him that Dr. Derrel Nip wants him to start taking the bystolic and that she has sent the rx to his pharmacy. Pt gave a verbal understanding.

## 2017-11-18 ENCOUNTER — Other Ambulatory Visit: Payer: Self-pay | Admitting: Internal Medicine

## 2017-11-19 NOTE — Progress Notes (Signed)
11:01 AM   Christopher Jenkins Aug 19, 1926 [id number]  Referring provider: [name], MD [place] [place], [place] [address]  Chief Complaint  Patient presents with  . Follow-up    HPI: Patient is an 84 year old Caucasian male with nocturia, a history of hematuria, a Bosniak 15F cyst and BPH with LU TS who presents today for a follow up.      Nocturia A few weeks ago he started to have nocturia x 1.  Patient denies any gross hematuria, dysuria or suprapubic/flank pain.  Patient denies any fevers, chills, nausea or vomiting.  His PVR is 16 mL.  He has been diagnosed with sleep apnea.  He states that he has been sleeping with his machine.    History of hematuria He has not had any further episodes of gross hematuria since his last visit with [place].  He has underwent CT Urograms in 2017 and 07/2016.  He was found to have a Bosniak 15F on the CT in 07/2015 and it remained unchanged on 07/2016 CTU.  Cystoscopy was negative for malignancies.  He has not had any gross hematuria.      Bosniak 15F cyst CT Urogram completed on 08/05/2015 noted a Bosniak category 15F lesion arising from the inferior pole of the left kidney is again noted and is unchanged on 04/2017 exam measuring 11 mm.  Patient did not remember having this lesion or the imaging studies in the past.  He is due for follow up imaging in 04/2018.  BPH WITH LUTS His IPSS score today is 1, which is mild lower urinary tract symptomatology. He is pleased with his quality life due to his urinary symptoms.  His previous I PSS score was 9/2.  His major complaint today is nocturia x 1.  It is improved with the He has had these symptoms for a few weeks.   He denies any dysuria, hematuria or suprapubic pain.  He currently taking finasteride 5 mg daily and tamsulosin 0. 4 mg daily.  He also denies any recent fevers, chills, nausea or vomiting.  He does not have a family history of PCa   IPSS    Row Name 11/20/17 1000         International Prostate Symptom Score   How often have you had the sensation of not emptying your bladder?  Not at All     How often have you had to urinate less than every two hours?  Not at All     How often have you found you stopped and started again several times when you urinated?  Not at All     How often have you found it difficult to postpone urination?  Not at All     How often have you had a weak urinary stream?  Not at All     How often have you had to strain to start urination?  Not at All     How many times did you typically get up at night to urinate?  1 Time     Total IPSS Score  1       Quality of Life due to urinary symptoms   If you were to spend the rest of your life with your urinary condition just the way it is now how would you feel about that?  Pleased        Score:  1-7 Mild 8-19 Moderate 20-35 Severe    PMH: Past Medical History:  Diagnosis  Date  . BPH (benign prostatic hyperplasia)   . Duodenal mass   . Elevated PSA   . Gross hematuria   . Hyperlipidemia   . Hypertension   . Hypertrophy (benign) of prostate    asymptomatic on finasteride. will f/up with urologist [name]  . Kidney lesion 07/2015   pt has appt at kidney specialist 08/05/15 to review CT scan.  . Obesity   . Overweight   . Tubular adenoma of colon   . Urinary retention     Surgical History: Past Surgical History:  Procedure Laterality Date  . APPENDECTOMY    . cataracts    . right knee surgery Right 12-17/2017  . TOTAL HIP ARTHROPLASTY Right 08/13/2015   Procedure: TOTAL HIP ARTHROPLASTY;  Surgeon: [name], MD;  Location: [facility] ORS;  Service: Orthopedics;  Laterality: Right;  . Uvula Surgery  1990    Home Medications:  Allergies as of 11/20/2017      Reactions   No Known Allergies       Medication List        Accurate as of 11/20/17 11:01 AM. Always use your most recent med list.          aspirin 325 MG tablet aspirin 325 mg tablet,delayed release  TAKE 1  TABLET (325 MG TOTAL) BY MOUTH DAILY.   atorvastatin 40 MG tablet Commonly known as:  LIPITOR TAKE 1 TABLET (40 MG TOTAL) BY MOUTH DAILY.   Calcium-Vitamin D 600-125 MG-UNIT Tabs Take 1 tablet by mouth daily at 2 PM. Reported on 09/02/2015   clopidogrel 75 MG tablet Commonly known as:  PLAVIX TAKE 1 TABLET (75 MG TOTAL) BY MOUTH DAILY.   Docusate Sodium 150 MG/15ML syrup Commonly known as:  DIOCTO PLACE 3 DROPS IN EACH EAR DAILY TO PREVENT OCCLUSION WITH WAX   finasteride 5 MG tablet Commonly known as:  PROSCAR Take 1 tablet (5 mg total) by mouth daily.   fish oil-omega-3 fatty acids 1000 MG capsule Take 1 g by mouth daily. Reported on 09/07/2015   furosemide 20 MG tablet Commonly known as:  LASIX TAKE 1 TABLET (20 MG TOTAL) BY MOUTH DAILY. IN THE MORNING   KLOR-CON M20 20 MEQ tablet Generic drug:  potassium chloride SA TAKE 1 TABLET (20 MEQ TOTAL) BY MOUTH DAILY.   meloxicam 7.5 MG tablet Commonly known as:  MOBIC TAKE 1 TABLET (7.5 MG TOTAL) BY MOUTH DAILY.   memantine 10 MG tablet Commonly known as:  NAMENDA TAKE 1 TABLET (10 MG TOTAL) BY MOUTH 2 (TWO) TIMES DAILY.   multivitamin with minerals tablet Take 1 tablet by mouth daily. At 1400   nebivolol 2.5 MG tablet Commonly known as:  BYSTOLIC Bystolic 2.5 mg tablet   niacin 250 MG tablet Take 250 mg by mouth daily. At 1400   tamsulosin 0.4 MG Caps capsule Commonly known as:  FLOMAX Take 1 capsule (0.4 mg total) by mouth daily.   traMADol 50 MG tablet Commonly known as:  ULTRAM tramadol 50 mg tablet       Allergies:  Allergies  Allergen Reactions  . No Known Allergies     Family History: Family History  Problem Relation Age of Onset  . Heart disease Mother   . Stroke Mother   . Heart disease Father   . Kidney disease Neg Hx   . Prostate cancer Neg Hx   . Kidney cancer Neg Hx   . Bladder Cancer Neg Hx     Social History:  reports that  he has never smoked. He has never used smokeless  tobacco. He reports that he does not drink alcohol or use drugs.  ROS: UROLOGY Frequent Urination?: No Hard to postpone urination?: No Burning/pain with urination?: No Get up at night to urinate?: No Leakage of urine?: No Urine stream starts and stops?: No Trouble starting stream?: No Do you have to strain to urinate?: No Blood in urine?: No Urinary tract infection?: No Sexually transmitted disease?: No Injury to kidneys or bladder?: No Painful intercourse?: No Weak stream?: No Erection problems?: No Penile pain?: No  Gastrointestinal Nausea?: No Vomiting?: No Indigestion/heartburn?: No Diarrhea?: No Constipation?: No  Constitutional Fever: No Night sweats?: No Weight loss?: No Fatigue?: No  Skin Skin rash/lesions?: No Itching?: No  Eyes Blurred vision?: No Double vision?: No  Ears/Nose/Throat Sore throat?: No Sinus problems?: No  Hematologic/Lymphatic Swollen glands?: No Easy bruising?: No  Cardiovascular Leg swelling?: No Chest pain?: No  Respiratory Cough?: No Shortness of breath?: No  Endocrine Excessive thirst?: No  Musculoskeletal Back pain?: No Joint pain?: No  Neurological Headaches?: No Dizziness?: No  Psychologic Depression?: No Anxiety?: No  Physical Exam: BP 124/81   Pulse 69   Ht 5\' 8"  (1.727 m)   Wt 224 lb (101.6 kg)   BMI 34.06 kg/m   Constitutional: Well nourished. Alert and oriented, No acute distress. HEENT: [place] AT, moist mucus membranes. Trachea midline, no masses. Cardiovascular: No clubbing, cyanosis, or edema. Respiratory: Normal respiratory effort, no increased work of breathing. GI: Abdomen is soft, non tender, non distended, no abdominal masses. Liver and spleen not palpable.  No hernias appreciated.  Stool sample for occult testing is not indicated.   GU: No CVA tenderness.  No bladder fullness or masses.  Patient with uncircumcised phallus.  Foreskin easily retracted  Urethral meatus is patent.  No penile  discharge. No penile lesions or rashes. Scrotum without lesions, cysts, rashes and/or edema.  Testicles are located scrotally bilaterally. No masses are appreciated in the testicles. Left and right epididymis are normal. Rectal: Patient with  normal sphincter tone. Anus and perineum without scarring or rashes. No rectal masses are appreciated. Prostate is approximately 55 grams, no nodules are appreciated. Seminal vesicles are normal. Skin: No rashes, bruises or suspicious lesions. Lymph: No cervical or inguinal adenopathy. Neurologic: Grossly intact, no focal deficits, moving all 4 extremities. Psychiatric: Normal mood and affect.   Laboratory Data: Lab Results  Component Value Date   WBC 8.9 09/01/2017   HGB 14.3 09/01/2017   HCT 41.8 09/01/2017   MCV 94.3 09/01/2017   PLT 179.0 09/01/2017    Lab Results  Component Value Date   CREATININE 0.95 09/01/2017   Pertinent Imaging Results for orders placed or performed in visit on 11/20/17  Bladder Scan (Post Void Residual) in office  Result Value Ref Range   Scan Result 14    I have reviewed the labs.  Assessment & Plan:    1. Nocturia Patient states that he is sleeping with a CPAP machine He is not a candidate for DDAVP therapy as he has a history of hyponatremia His PVR has diminished and he find the tamsulosin helpful RTC in one year for I PSS and PVR  2. History of hematuria  - Completed a hematuria workup with CT urograms and cystoscopy in December 2016 - no malignancies found  - no reports of gross hematuria  3. Left Bosniak 93F cyst  - due for follow up imaging in 04/2018  4. BPH with LUTS  -  IPSS score is 1/1, it is improved  - Continue conservative management, avoiding bladder irritants and timed voiding's  - most bothersome symptom is nocturia x 1  - Continue finasteride 5 mg daily and tamsulosin 0.4 mg daily  - RTC in 1 year for IPS'S, exam and PVR  Return in about 5 months (around 04/22/2018) for RUS report  .  Zara Council, PA-C  [facility] [address] [place], [place] [address] [phone]

## 2017-11-20 ENCOUNTER — Encounter: Payer: Self-pay | Admitting: Urology

## 2017-11-20 ENCOUNTER — Ambulatory Visit (INDEPENDENT_AMBULATORY_CARE_PROVIDER_SITE_OTHER): Payer: Medicare Other | Admitting: Urology

## 2017-11-20 VITALS — BP 124/81 | HR 69 | Ht 68.0 in | Wt 224.0 lb

## 2017-11-20 DIAGNOSIS — R351 Nocturia: Secondary | ICD-10-CM

## 2017-11-20 DIAGNOSIS — N401 Enlarged prostate with lower urinary tract symptoms: Secondary | ICD-10-CM

## 2017-11-20 DIAGNOSIS — Z87448 Personal history of other diseases of urinary system: Secondary | ICD-10-CM

## 2017-11-20 DIAGNOSIS — N138 Other obstructive and reflux uropathy: Secondary | ICD-10-CM | POA: Diagnosis not present

## 2017-11-20 DIAGNOSIS — N2889 Other specified disorders of kidney and ureter: Secondary | ICD-10-CM | POA: Diagnosis not present

## 2017-11-20 LAB — BLADDER SCAN AMB NON-IMAGING: Scan Result: 14

## 2018-01-12 ENCOUNTER — Telehealth: Payer: Self-pay | Admitting: Radiology

## 2018-01-12 NOTE — Telephone Encounter (Signed)
Christopher Jenkins should come in for an UA and urine culture.

## 2018-01-12 NOTE — Telephone Encounter (Signed)
Patient called to report hematuria. States it has improved slightly but wants to let [place] know.

## 2018-01-12 NOTE — Telephone Encounter (Signed)
Called to speak with pt, no answer.

## 2018-01-16 ENCOUNTER — Other Ambulatory Visit: Payer: Medicare Other

## 2018-01-16 ENCOUNTER — Other Ambulatory Visit: Payer: Self-pay | Admitting: Internal Medicine

## 2018-01-16 DIAGNOSIS — R351 Nocturia: Secondary | ICD-10-CM | POA: Diagnosis not present

## 2018-01-16 DIAGNOSIS — Z87448 Personal history of other diseases of urinary system: Secondary | ICD-10-CM

## 2018-01-16 DIAGNOSIS — N401 Enlarged prostate with lower urinary tract symptoms: Secondary | ICD-10-CM | POA: Diagnosis not present

## 2018-01-16 DIAGNOSIS — R310 Gross hematuria: Secondary | ICD-10-CM | POA: Diagnosis not present

## 2018-01-16 NOTE — Telephone Encounter (Signed)
Pt informed he is going to come in for a urine

## 2018-01-17 LAB — URINALYSIS, COMPLETE
BILIRUBIN UA: NEGATIVE
GLUCOSE, UA: NEGATIVE
KETONES UA: NEGATIVE
Leukocytes, UA: NEGATIVE
NITRITE UA: NEGATIVE
PROTEIN UA: NEGATIVE
RBC, UA: NEGATIVE
SPEC GRAV UA: 1.02 (ref 1.005–1.030)
Urobilinogen, Ur: 1 mg/dL (ref 0.2–1.0)
pH, UA: 5.5 (ref 5.0–7.5)

## 2018-01-17 LAB — MICROSCOPIC EXAMINATION: BACTERIA UA: NONE SEEN

## 2018-01-20 ENCOUNTER — Other Ambulatory Visit: Payer: Self-pay | Admitting: Internal Medicine

## 2018-01-22 ENCOUNTER — Telehealth: Payer: Self-pay

## 2018-01-22 LAB — CULTURE, URINE COMPREHENSIVE

## 2018-01-22 MED ORDER — NITROFURANTOIN MONOHYD MACRO 100 MG PO CAPS: 100.0000 mg | ORAL_CAPSULE | Freq: Two times a day (BID) | ORAL | 0 refills | Status: AC

## 2018-01-22 NOTE — Telephone Encounter (Signed)
Attempted to reach pt, no answer. Will try again, rx sent to pharmacy.

## 2018-01-22 NOTE — Telephone Encounter (Signed)
-----   Message from Nori Riis, PA-C sent at 01/22/2018 10:11 AM EDT ----- Please let Christopher Jenkins know that his urine culture is positive.  He needs to start nitrofurantoin 100 mg bid for seven days.  We then need to recheck his UA after he finishes his antibiotic we need to recheck his urine to ensure the blood has cleared.

## 2018-01-23 ENCOUNTER — Other Ambulatory Visit: Payer: Self-pay

## 2018-01-23 DIAGNOSIS — N138 Other obstructive and reflux uropathy: Secondary | ICD-10-CM

## 2018-01-23 DIAGNOSIS — N401 Enlarged prostate with lower urinary tract symptoms: Principal | ICD-10-CM

## 2018-01-23 MED ORDER — TAMSULOSIN HCL 0.4 MG PO CAPS: 0.4000 mg | ORAL_CAPSULE | Freq: Every day | ORAL | 3 refills | Status: DC

## 2018-01-23 NOTE — Telephone Encounter (Signed)
Pt informed

## 2018-02-23 ENCOUNTER — Ambulatory Visit (INDEPENDENT_AMBULATORY_CARE_PROVIDER_SITE_OTHER): Payer: Medicare Other

## 2018-02-23 VITALS — BP 128/68 | HR 51 | Temp 98.4°F | Resp 16 | Ht 68.0 in | Wt 230.0 lb

## 2018-02-23 DIAGNOSIS — Z0000 Encounter for general adult medical examination without abnormal findings: Secondary | ICD-10-CM | POA: Diagnosis not present

## 2018-02-23 NOTE — Patient Instructions (Addendum)
  Christopher Jenkins , Thank you for taking time to come for your Medicare Wellness Visit. I appreciate your ongoing commitment to your health goals. Please review the following plan we discussed and let me know if I can assist you in the future.   Check medications with list when you get home.   Bring medications to next visit.  Follow up with your doctor as needed.   These are the goals we discussed: Goals      Patient Stated   . Lose weight (pt-stated)     Low glycemic index diet with smaller more frequent meals, as previously directed by primary care physician Supplement standard slim fast drinks for higher protein (premier protein, ensure max) as a snack or meal Stay active with aerobic walking 5 days weekly, 30 minutes. Stay hydrated.       This is a list of the screening recommended for you and due dates:  Health Maintenance  Topic Date Due  . Flu Shot  01/18/2018  . Tetanus Vaccine  05/31/2026  . Pneumonia vaccines  Completed

## 2018-02-23 NOTE — Progress Notes (Signed)
Reviewed, agree with LPN note LG

## 2018-02-23 NOTE — Progress Notes (Signed)
Subjective:   Christopher Jenkins is a 84 y.o. male who presents for Medicare Annual/Subsequent preventive examination.  Review of Systems:  No ROS.  Medicare Wellness Visit. Additional risk factors are reflected in the social history. Cardiac Risk Factors include: advanced age (>63men, >18 women);hypertension;obesity (BMI >30kg/m2);male gender     Objective:    Vitals: BP 128/68 (BP Location: Left Arm, Patient Position: Sitting, Cuff Size: Normal)   Pulse (!) 51   Temp 98.4 F (36.9 C) (Oral)   Resp 16   Ht 5\' 8"  (1.727 m)   Wt 230 lb (104.3 kg)   SpO2 95%   BMI 34.97 kg/m   Body mass index is 34.97 kg/m.  Advanced Directives 02/23/2018 02/22/2017 05/31/2016 09/02/2015 08/13/2015 07/29/2015 06/16/2015  Does Patient Have a Medical Advance Directive? Yes Yes No Yes Yes Yes No  Type of Paramedic of [place];Living will [place];Living will - [place];Living will [place];Living will [place];Living will -  Does patient want to make changes to medical advance directive? No - Patient declined No - Patient declined - - - No - Patient declined -  Copy of [place] in Chart? Yes Yes - No - copy requested No - copy requested No - copy requested -    Tobacco Social History   Tobacco Use  Smoking Status Never Smoker  Smokeless Tobacco Never Used     Counseling given: Not Answered   Clinical Intake:  Pre-visit preparation completed: Yes  Pain : No/denies pain     Nutritional Status: BMI > 30  Obese Diabetes: No  How often do you need to have someone help you when you read instructions, pamphlets, or other written materials from your doctor or pharmacy?: 1 - Never  Interpreter Needed?: No     Past Medical History:  Diagnosis Date  . BPH (benign prostatic hyperplasia)   . Duodenal mass   . Elevated PSA   . Gross hematuria   . Hyperlipidemia   .  Hypertension   . Hypertrophy (benign) of prostate    asymptomatic on finasteride. will f/up with urologist [name]  . Kidney lesion 07/2015   pt has appt at kidney specialist 08/05/15 to review CT scan.  . Obesity   . Overweight   . Tubular adenoma of colon   . Urinary retention    Past Surgical History:  Procedure Laterality Date  . APPENDECTOMY    . cataracts    . right knee surgery Right 12-17/2017  . TOTAL HIP ARTHROPLASTY Right 08/13/2015   Procedure: TOTAL HIP ARTHROPLASTY;  Surgeon: [name], MD;  Location: [facility] ORS;  Service: Orthopedics;  Laterality: Right;  . Uvula Surgery  1990   Family History  Problem Relation Age of Onset  . Heart disease Mother   . Stroke Mother   . Heart disease Father   . Kidney disease Neg Hx   . Prostate cancer Neg Hx   . Kidney cancer Neg Hx   . Bladder Cancer Neg Hx    Social History   Socioeconomic History  . Marital status: Married    Spouse name: Not on file  . Number of children: Not on file  . Years of education: Not on file  . Highest education level: Not on file  Occupational History  . Occupation: Retired  Scientific laboratory technician  . Financial resource strain: Not hard at all  . Food insecurity:    Worry:  Never true    Inability: Never true  . Transportation needs:    Medical: No    Non-medical: No  Tobacco Use  . Smoking status: Never Smoker  . Smokeless tobacco: Never Used  Substance and Sexual Activity  . Alcohol use: No    Comment: Occasional glass of wine 3-4 times a year.  . Drug use: No  . Sexual activity: Never  Lifestyle  . Physical activity:    Days per week: 5 days    Minutes per session: 20 min  . Stress: Not at all  Relationships  . Social connections:    Talks on phone: Not on file    Gets together: Not on file    Attends religious service: Not on file    Active member of club or organization: Not on file    Attends meetings of clubs or organizations: Not on file    Relationship status: Not on  file  Other Topics Concern  . 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    Outpatient Encounter Medications as of 02/23/2018  Medication Sig  . aspirin 325 MG tablet aspirin 325 mg tablet,delayed release  TAKE 1 TABLET (325 MG TOTAL) BY MOUTH DAILY.  Marland Kitchen atorvastatin (LIPITOR) 40 MG tablet TAKE 1 TABLET (40 MG TOTAL) BY MOUTH DAILY.  . Calcium Carbonate-Vitamin D (CALCIUM-VITAMIN D) 600-125 MG-UNIT TABS Take 1 tablet by mouth daily at 2 PM. Reported on 09/02/2015  . clopidogrel (PLAVIX) 75 MG tablet TAKE 1 TABLET (75 MG TOTAL) BY MOUTH DAILY.  Marland Kitchen Docusate Sodium (DIOCTO) 150 MG/15ML syrup PLACE 3 DROPS IN EACH EAR DAILY TO PREVENT OCCLUSION WITH WAX  . finasteride (PROSCAR) 5 MG tablet Take 1 tablet (5 mg total) by mouth daily.  . fish oil-omega-3 fatty acids 1000 MG capsule Take 1 g by mouth daily. Reported on 09/07/2015  . furosemide (LASIX) 20 MG tablet TAKE 1 TABLET (20 MG TOTAL) BY MOUTH DAILY. IN THE MORNING  . KLOR-CON M20 20 MEQ tablet TAKE 1 TABLET (20 MEQ TOTAL) BY MOUTH DAILY.  . meloxicam (MOBIC) 7.5 MG tablet TAKE 1 TABLET (7.5 MG TOTAL) BY MOUTH DAILY.  . memantine (NAMENDA) 10 MG tablet TAKE 1 TABLET (10 MG TOTAL) BY MOUTH 2 (TWO) TIMES DAILY.  . Multiple Vitamins-Minerals (MULTIVITAMIN WITH MINERALS) tablet Take 1 tablet by mouth daily. At 1400  . nebivolol (BYSTOLIC) 2.5 MG tablet TAKE 1 TABLET BY MOUTH DAILY  . niacin 250 MG tablet Take 250 mg by mouth daily. At 1400  . tamsulosin (FLOMAX) 0.4 MG CAPS capsule Take 1 capsule (0.4 mg total) by mouth daily.  . traMADol (ULTRAM) 50 MG tablet tramadol 50 mg tablet   No facility-administered encounter medications on file as of 02/23/2018.     Activities of Daily Living In your present state of health, do you have any difficulty performing the following  activities: 02/23/2018  Hearing? N  Vision? N  Difficulty concentrating or making decisions? Y  Comment Difficulty remembering  Walking or climbing stairs? N  Dressing or bathing? N  Doing errands, shopping? N  Preparing Food and eating ? N  Using the Toilet? N  In the past six months, have you accidently leaked urine? Y  Comment Followed by Urology. Managed with a daily brief.  Do you have problems with loss of bowel control? N  Managing your Medications? Y  Comment States, "No, but I don't know what they are. I guess I am taking them."I asked him to compare medications to list when he gets home and verify.  Bring all medicatiions to next routine scheduled appointment. He agreed.  Managing your Finances? N  Housekeeping or managing your Housekeeping? Y  Comment Staff assists twice monthly.  Some recent data might be hidden    Patient Care Team: [name], MD as PCP - General (Internal Medicine)   Assessment:   This is a routine wellness examination for A[place]. The goal of the wellness visit is to assist the patient how to close the gaps in care and create a preventative care plan for the patient.   The roster of all physicians providing medical care to patient is listed in the Snapshot section of the chart.  Taking calcium VIT D as appropriate/Osteoporosis risk reviewed.    Safety issues reviewed; Smoke and carbon monoxide detectors in the home. No firearms in the home. Wears seatbelts when driving or riding with others. No violence in the home.  They do not have excessive sun exposure.  Discussed the need for sun protection: hats, long sleeves and the use of sunscreen if there is significant sun exposure.  Correctly identified the president of the [place], season, year, counting from 20-0, subtracting by 3.  Recalls of 1/3 words.  BMI- discussed the importance of a healthy diet, water intake and the benefits of aerobic exercise. Educational material provided.   24 hour  diet recall: Breakfast: Cereal Lunch: Slim fast drink Dinner: Meal  Dental- dentures.   Sleep patterns- States he sleeps well and wakes feeling rested.  CPAP in use.  Influenza vaccine discussed. He plans to receive at his local pharmacy with his wife. Encouraged to notify his primary care provider upon completion.    Patient Concerns: None at this time. Follow up with PCP as needed.  Exercise Activities and Dietary recommendations Current Exercise Habits: Home exercise routine, Type of exercise: walking, Time (Minutes): 25, Frequency (Times/Week): 5, Weekly Exercise (Minutes/Week): 125, Intensity: Moderate  Goals      Patient Stated   . Lose weight (pt-stated)     Low glycemic index diet with smaller more frequent meals, as previously directed by primary care physician Supplement standard slim fast drinks for higher protein (premier protein, ensure max) as a snack or meal Stay active with aerobic walking 5 days weekly, 30 minutes. Stay hydrated.       Fall Risk Fall Risk  02/23/2018 02/22/2017 10/06/2016 09/02/2015 07/06/2012  Falls in the past year? No Yes Yes No No  Number falls in past yr: - 2 or more 1 - -  Injury with Fall? - Yes No - -  Comment - Fell down stairs at [employer].  Sought medical attention. Tripped over his feet playing tennis. - - -  Follow up - Falls prevention discussed;Education provided - - -   Depression Screen PHQ 2/9 Scores 02/23/2018 02/22/2017 10/06/2016 09/02/2015  PHQ - 2 Score 0 0 0 0  PHQ- 9 Score - 0 - -    Cognitive Function MMSE - Mini Mental State Exam 02/23/2018 02/22/2017 09/02/2015  Orientation to time 4 5 5   Orientation to time comments Wrong time shown on clock face. 10:55 drawn; needed 11:10.  - -  Orientation to Place 5 5 5   Registration 3 3 3   Attention/ Calculation 5 5 5  Recall 1 3 3   Language- name 2 objects 2 2 2   Language- repeat 1 1 1   Language- follow 3 step command 3 3 3   Language- read & follow direction 1 1 1   Write a sentence  1 1 1   Copy design 1 1 1   Total score 27 30 30         Immunization History  Administered Date(s) Administered  . Hep A / Hep B 03/17/2016  . Influenza Split 04/03/2012, 03/12/2013  . Influenza,inj,Quad PF,6+ Mos 02/24/2015  . Influenza-Unspecified 03/29/2014, 03/08/2017  . Pneumococcal Conjugate-13 05/07/2014  . Pneumococcal Polysaccharide-23 03/26/2013  . Tdap 07/29/2009, 05/31/2016  . Zoster 04/26/2013   Screening Tests Health Maintenance  Topic Date Due  . INFLUENZA VACCINE  01/18/2018  . TETANUS/TDAP  05/31/2026  . PNA vac Low Risk Adult  Completed     Plan:   End of life planning; Advance aging; Advanced directives discussed. Copy of current HCPOA/Living Will on file.    I have personally reviewed and noted the following in the patient's chart:   . Medical and social history . Use of alcohol, tobacco or illicit drugs  . Current medications and supplements . Functional ability and status . Nutritional status . Physical activity . Advanced directives . List of other physicians . Hospitalizations, surgeries, and ER visits in previous 12 months . Vitals . Screenings to include cognitive, depression, and falls . Referrals and appointments  In addition, I have reviewed and discussed with patient certain preventive protocols, quality metrics, and best practice recommendations. A written personalized care plan for preventive services as well as general preventive health recommendations were provided to patient.     Varney Biles, LPN  12/23/6431

## 2018-03-07 ENCOUNTER — Ambulatory Visit (INDEPENDENT_AMBULATORY_CARE_PROVIDER_SITE_OTHER): Payer: Medicare Other | Admitting: Urology

## 2018-03-07 ENCOUNTER — Encounter: Payer: Self-pay | Admitting: Urology

## 2018-03-07 VITALS — BP 143/71 | HR 52 | Ht 68.0 in | Wt 228.6 lb

## 2018-03-07 DIAGNOSIS — N401 Enlarged prostate with lower urinary tract symptoms: Secondary | ICD-10-CM

## 2018-03-07 DIAGNOSIS — N138 Other obstructive and reflux uropathy: Secondary | ICD-10-CM | POA: Diagnosis not present

## 2018-03-07 LAB — URINALYSIS, COMPLETE
BILIRUBIN UA: NEGATIVE
GLUCOSE, UA: NEGATIVE
Ketones, UA: NEGATIVE
Nitrite, UA: NEGATIVE
RBC UA: NEGATIVE
SPEC GRAV UA: 1.025 (ref 1.005–1.030)
UUROB: 1 mg/dL (ref 0.2–1.0)
pH, UA: 6.5 (ref 5.0–7.5)

## 2018-03-07 LAB — MICROSCOPIC EXAMINATION
RBC, UA: NONE SEEN /hpf (ref 0–2)
WBC UA: NONE SEEN /HPF (ref 0–5)

## 2018-03-07 NOTE — Progress Notes (Signed)
1:27 PM   Christopher Jenkins 07-Jul-1926 [id number]  Referring provider: [name], MD [place], [place] [address]  Chief Complaint  Patient presents with  . Follow-up    HPI: Patient is an 84 year old Caucasian male with a history of hematuria, a Bosniak 47F cyst and BPH with LU TS who presents today for nocturia.     Nocturia Sleeping with CPAP machine.  Nocturia x 1.     History of hematuria He has underwent CT Urograms in 2017 and 07/2016.  He was found to have a Bosniak 47F on the CT in 07/2015 and it remained unchanged on 07/2016 CTU.  Cystoscopy was negative for malignancies.  He has not had any gross hematuria.  His UA today is negative for hematuria.   Bosniak 47F cyst CT Urogram completed on 08/05/2015 noted a Bosniak category 47F lesion arising from the inferior pole of the left kidney is again noted and is unchanged on 04/2017 exam measuring 11 mm.  Patient did not remember having this lesion or the imaging studies in the past.  He is due for follow up imaging in 04/2018.  BPH WITH LUTS His IPSS score today is 1, which is mild lower urinary tract symptomatology. He is pleased with his quality life due to his urinary symptoms.  His previous I PSS score was 9/2.  His major complaint today is nocturia x 1.  He denies any dysuria, hematuria or suprapubic pain.  He currently taking tamsulosin 0.4 mg daily and finasteride 5 mg daily.  He also denies any recent fevers, chills, nausea or vomiting.  He does not have a family history of PCa.  IPSS    Row Name 03/07/18 1300         International Prostate Symptom Score   How often have you had the sensation of not emptying your bladder?  Not at All     How often have you had to urinate less than every two hours?  Not at All     How often have you found you stopped and started again several times when you urinated?  Not at All     How often have you found it difficult to postpone urination?  Not at All      How often have you had a weak urinary stream?  Not at All     How often have you had to strain to start urination?  Not at All     How many times did you typically get up at night to urinate?  1 Time     Total IPSS Score  1       Quality of Life due to urinary symptoms   If you were to spend the rest of your life with your urinary condition just the way it is now how would you feel about that?  Pleased        Score:  1-7 Mild 8-19 Moderate 20-35 Severe    PMH: Past Medical History:  Diagnosis Date  . BPH (benign prostatic hyperplasia)   . Duodenal mass   . Elevated PSA   . Gross hematuria   . Hyperlipidemia   . Hypertension   . Hypertrophy (benign) of prostate    asymptomatic on finasteride. will f/up with urologist [name]  . Kidney lesion 07/2015   pt has appt at kidney specialist 08/05/15 to review CT scan.  . Obesity   . Overweight   . Tubular adenoma  of colon   . Urinary retention     Surgical History: Past Surgical History:  Procedure Laterality Date  . APPENDECTOMY    . cataracts    . right knee surgery Right 12-17/2017  . TOTAL HIP ARTHROPLASTY Right 08/13/2015   Procedure: TOTAL HIP ARTHROPLASTY;  Surgeon: [name], MD;  Location: [facility] ORS;  Service: Orthopedics;  Laterality: Right;  . Uvula Surgery  1990    Home Medications:  Allergies as of 03/07/2018      Reactions   No Known Allergies       Medication List        Accurate as of 03/07/18  1:27 PM. Always use your most recent med list.          aspirin 325 MG tablet aspirin 325 mg tablet,delayed release  TAKE 1 TABLET (325 MG TOTAL) BY MOUTH DAILY.   atorvastatin 40 MG tablet Commonly known as:  LIPITOR TAKE 1 TABLET (40 MG TOTAL) BY MOUTH DAILY.   Calcium-Vitamin D 600-125 MG-UNIT Tabs Take 1 tablet by mouth daily at 2 PM. Reported on 09/02/2015   clopidogrel 75 MG tablet Commonly known as:  PLAVIX TAKE 1 TABLET (75 MG TOTAL) BY MOUTH DAILY.   Docusate Sodium 150  MG/15ML syrup PLACE 3 DROPS IN EACH EAR DAILY TO PREVENT OCCLUSION WITH WAX   finasteride 5 MG tablet Commonly known as:  PROSCAR Take 1 tablet (5 mg total) by mouth daily.   fish oil-omega-3 fatty acids 1000 MG capsule Take 1 g by mouth daily. Reported on 09/07/2015   furosemide 20 MG tablet Commonly known as:  LASIX TAKE 1 TABLET (20 MG TOTAL) BY MOUTH DAILY. IN THE MORNING   KLOR-CON M20 20 MEQ tablet Generic drug:  potassium chloride SA TAKE 1 TABLET (20 MEQ TOTAL) BY MOUTH DAILY.   meloxicam 7.5 MG tablet Commonly known as:  MOBIC TAKE 1 TABLET (7.5 MG TOTAL) BY MOUTH DAILY.   memantine 10 MG tablet Commonly known as:  NAMENDA TAKE 1 TABLET (10 MG TOTAL) BY MOUTH 2 (TWO) TIMES DAILY.   multivitamin with minerals tablet Take 1 tablet by mouth daily. At 1400   nebivolol 2.5 MG tablet Commonly known as:  BYSTOLIC TAKE 1 TABLET BY MOUTH DAILY   niacin 250 MG tablet Take 250 mg by mouth daily. At 1400   tamsulosin 0.4 MG Caps capsule Commonly known as:  FLOMAX Take 1 capsule (0.4 mg total) by mouth daily.   traMADol 50 MG tablet Commonly known as:  ULTRAM tramadol 50 mg tablet       Allergies:  Allergies  Allergen Reactions  . No Known Allergies     Family History: Family History  Problem Relation Age of Onset  . Heart disease Mother   . Stroke Mother   . Heart disease Father   . Kidney disease Neg Hx   . Prostate cancer Neg Hx   . Kidney cancer Neg Hx   . Bladder Cancer Neg Hx     Social History:  reports that he has never smoked. He has never used smokeless tobacco. He reports that he does not drink alcohol or use drugs.  ROS: UROLOGY Frequent Urination?: No Hard to postpone urination?: No Burning/pain with urination?: No Get up at night to urinate?: Yes Leakage of urine?: No Urine stream starts and stops?: No Trouble starting stream?: No Do you have to strain to urinate?: No Blood in urine?: No Urinary tract infection?: No Sexually  transmitted disease?: No Injury to kidneys  or bladder?: No Painful intercourse?: No Weak stream?: No Erection problems?: No Penile pain?: No  Gastrointestinal Nausea?: No Vomiting?: No Indigestion/heartburn?: No Diarrhea?: No Constipation?: No  Constitutional Fever: No Night sweats?: No Weight loss?: No Fatigue?: No  Skin Skin rash/lesions?: No Itching?: No  Eyes Blurred vision?: No Double vision?: No  Ears/Nose/Throat Sore throat?: No Sinus problems?: No  Hematologic/Lymphatic Swollen glands?: No Easy bruising?: No  Cardiovascular Leg swelling?: No Chest pain?: No  Respiratory Cough?: No Shortness of breath?: No  Endocrine Excessive thirst?: No  Musculoskeletal Back pain?: No Joint pain?: No  Neurological Headaches?: No Dizziness?: No  Psychologic Depression?: No Anxiety?: No  Physical Exam: BP (!) 143/71 (BP Location: Left Arm, Patient Position: Sitting, Cuff Size: Large)   Pulse (!) 52   Ht 5\' 8"  (1.727 m)   Wt 228 lb 9.6 oz (103.7 kg)   BMI 34.76 kg/m   Constitutional: Well nourished. Alert and oriented, No acute distress. HEENT: [place] AT, moist mucus membranes. Trachea midline, no masses. Cardiovascular: No clubbing, cyanosis, or edema. Respiratory: Normal respiratory effort, no increased work of breathing. GI: Abdomen is soft, non tender, non distended, no abdominal masses. Liver and spleen not palpable.  No hernias appreciated.  Stool sample for occult testing is not indicated.   GU: No CVA tenderness.  No bladder fullness or masses.  Patient with uncircumcised phallus.  Foreskin easily retracted Urethral meatus is patent.  No penile discharge. No penile lesions or rashes. Scrotum without lesions, cysts, rashes and/or edema.  Testicles are located scrotally bilaterally. No masses are appreciated in the testicles. Left and right epididymis are normal. Rectal: Patient with  normal sphincter tone. Anus and perineum without scarring or rashes.  No rectal masses are appreciated. Prostate is approximately 60 + grams, could only palpate the apex and midportion, no nodules are appreciated.  Skin: No rashes, bruises or suspicious lesions. Lymph: No cervical or inguinal adenopathy. Neurologic: Grossly intact, no focal deficits, moving all 4 extremities. Psychiatric: Normal mood and affect.   Laboratory Data: Lab Results  Component Value Date   WBC 8.9 09/01/2017   HGB 14.3 09/01/2017   HCT 41.8 09/01/2017   MCV 94.3 09/01/2017   PLT 179.0 09/01/2017    Lab Results  Component Value Date   CREATININE 0.95 09/01/2017    Results for orders placed or performed in visit on 01/16/18  CULTURE, URINE COMPREHENSIVE  Result Value Ref Range   Urine Culture, Comprehensive Final report (A)    Organism ID, Bacteria Enterococcus durans (A)    Organism ID, Bacteria Comment    ANTIMICROBIAL SUSCEPTIBILITY Comment   Microscopic Examination  Result Value Ref Range   WBC, UA 0-5 0 - 5 /hpf   RBC, UA 3-10 (A) 0 - 2 /hpf   Epithelial Cells (non renal) 0-10 0 - 10 /hpf   Mucus, UA Present (A) Not Estab.   Bacteria, UA None seen None seen/Few  Urinalysis, Complete  Result Value Ref Range   Specific Gravity, UA 1.020 1.005 - 1.030   pH, UA 5.5 5.0 - 7.5   Color, UA Yellow Yellow   Appearance Ur Clear Clear   Leukocytes, UA Negative Negative   Protein, UA Negative Negative/Trace   Glucose, UA Negative Negative   Ketones, UA Negative Negative   RBC, UA Negative Negative   Bilirubin, UA Negative Negative   Urobilinogen, Ur 1.0 0.2 - 1.0 mg/dL   Nitrite, UA Negative Negative   Microscopic Examination See below:   I have reviewed the  labs    Assessment & Plan:    1. Nocturia Patient states that he is sleeping with a CPAP machine He is not a candidate for DDAVP therapy as he has a history of hyponatremia He is only getting up one time nightly RTC in one year for I PSS  2. History of hematuria Completed a hematuria workup with CT  urograms and cystoscopy in December 2016 - no malignancies found UA was negative for hematuria  3. Left Bosniak 70F cyst Due for follow up imaging in 04/2018 Will call with results  4. BPH with LUTS  - IPSS score is 1/1, it is improved  - Continue conservative management, avoiding bladder irritants and timed voiding's  - most bothersome symptom is nocturia x 1  - Continue finasteride 5 mg daily and tamsulosin 0.4 mg daily  - RTC in 1 year for IPS'S and exam  Return in about 1 year (around 03/08/2019) for IPSS, CT report and exam.  Zara Council, [facility]  [address] [place], [place] [address] [phone]

## 2018-03-09 DIAGNOSIS — Z23 Encounter for immunization: Secondary | ICD-10-CM | POA: Diagnosis not present

## 2018-03-20 DIAGNOSIS — D3132 Benign neoplasm of left choroid: Secondary | ICD-10-CM | POA: Diagnosis not present

## 2018-03-30 ENCOUNTER — Other Ambulatory Visit: Payer: Self-pay | Admitting: Internal Medicine

## 2018-04-10 ENCOUNTER — Encounter: Payer: Self-pay | Admitting: Internal Medicine

## 2018-04-10 ENCOUNTER — Ambulatory Visit (INDEPENDENT_AMBULATORY_CARE_PROVIDER_SITE_OTHER): Payer: Medicare Other | Admitting: Internal Medicine

## 2018-04-10 VITALS — BP 132/74 | HR 49 | Temp 98.1°F | Resp 16 | Ht 68.0 in | Wt 230.0 lb

## 2018-04-10 DIAGNOSIS — R748 Abnormal levels of other serum enzymes: Secondary | ICD-10-CM

## 2018-04-10 DIAGNOSIS — I10 Essential (primary) hypertension: Secondary | ICD-10-CM | POA: Diagnosis not present

## 2018-04-10 DIAGNOSIS — Z7189 Other specified counseling: Secondary | ICD-10-CM | POA: Diagnosis not present

## 2018-04-10 DIAGNOSIS — G309 Alzheimer's disease, unspecified: Secondary | ICD-10-CM | POA: Diagnosis not present

## 2018-04-10 DIAGNOSIS — F0280 Dementia in other diseases classified elsewhere without behavioral disturbance: Secondary | ICD-10-CM

## 2018-04-10 DIAGNOSIS — G4733 Obstructive sleep apnea (adult) (pediatric): Secondary | ICD-10-CM

## 2018-04-10 DIAGNOSIS — E7800 Pure hypercholesterolemia, unspecified: Secondary | ICD-10-CM

## 2018-04-10 LAB — COMPREHENSIVE METABOLIC PANEL
ALT: 13 U/L (ref 0–53)
AST: 17 U/L (ref 0–37)
Albumin: 3.8 g/dL (ref 3.5–5.2)
Alkaline Phosphatase: 109 U/L (ref 39–117)
BUN: 22 mg/dL (ref 6–23)
CHLORIDE: 104 meq/L (ref 96–112)
CO2: 32 mEq/L (ref 19–32)
Calcium: 9.2 mg/dL (ref 8.4–10.5)
Creatinine, Ser: 1.03 mg/dL (ref 0.40–1.50)
GFR: 72.3 mL/min (ref >60.00)
Glucose, Bld: 118 mg/dL — ABNORMAL HIGH (ref 70–99)
POTASSIUM: 4.7 meq/L (ref 3.5–5.1)
SODIUM: 142 meq/L (ref 135–145)
Total Bilirubin: 0.6 mg/dL (ref 0.2–1.2)
Total Protein: 6.5 g/dL (ref 6.0–8.3)

## 2018-04-10 LAB — LIPID PANEL
Cholesterol: 103 mg/dL (ref 0–200)
HDL: 35.9 mg/dL — AB (ref >39.00)
LDL CALC: 37 mg/dL (ref 0–99)
NONHDL: 66.95
Total CHOL/HDL Ratio: 3
Triglycerides: 148 mg/dL (ref 0.0–149.0)
VLDL: 29.6 mg/dL (ref 0.0–40.0)

## 2018-04-10 NOTE — Assessment & Plan Note (Signed)
His deficits  Are thus far limited to memory of names and events.  Continue Namenda. He has designated his wife,  followed by his children  as POA

## 2018-04-10 NOTE — Assessment & Plan Note (Signed)
  Resolved upon repeat today  Lab Results  Component Value Date   ALT 13 04/10/2018   AST 17 04/10/2018   ALKPHOS 109 04/10/2018   BILITOT 0.6 04/10/2018

## 2018-04-10 NOTE — Progress Notes (Addendum)
Subjective:  Patient ID: Christopher Jenkins, male    DOB: 03/02/1927  Age: 84 y.o. MRN: [id number]  CC: The primary encounter diagnosis was Essential hypertension. Diagnoses of Elevated liver enzymes, Pure hypercholesterolemia, Alzheimer's dementia without behavioral disturbance, unspecified timing of dementia onset ([place]), Do not resuscitate discussion, and OSA (obstructive sleep apnea) were also pertinent to this visit.  HPI Christopher Jenkins presents for 6 month follow upon  Hypertension, hyperlipidemia,  Obesity, and dementia.  Patient is not confident about his medications upon  Review, but he states that he is using a weekly pill box  To ensure adherence .  He does not check blood pressure.  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  He has also given up Playing tennis because he is concerned about the consequences of an injury. He is walking every other day,  Knows  He should do more,  But can't seem to find the motivation.   He has gained 11 lbs over the last year.  He has not had a fall in  The last year.  Weight gain addressed. HAS CHANGED EATING HABITS.  USING  ENSURE NUTRITIONAL SUPPLEMENT  AT NIGHT INSTEAD OF DINNER ON ALTERNATING NIGHTS  NAPPING DURING THE DAY AND WEARING CPAP  BOTH DURING NAPS AND AT NIGHT .  Son visited with him in April and again in September.  Lives in [place],  Yolanda Bonine is enrolled at  [place] A DNR SIGNED IN 2009 ON FILE  AND AN ADVANCED DIRECTIVE with his wife [name] followed by his two children as alternates.   Outpatient Medications Prior to Visit  Medication Sig Dispense Refill  . aspirin 325 MG tablet aspirin 325 mg tablet,delayed release  TAKE 1 TABLET (325 MG TOTAL) BY MOUTH DAILY.    Marland Kitchen atorvastatin (LIPITOR) 40 MG tablet TAKE 1 TABLET (40 MG TOTAL) BY MOUTH DAILY. 90 tablet 0  . Calcium Carbonate-Vitamin D (CALCIUM-VITAMIN D) 600-125 MG-UNIT TABS Take 1 tablet by mouth  daily at 2 PM. Reported on 09/02/2015    . fish oil-omega-3 fatty acids 1000 MG capsule Take 1 g by mouth daily. Reported on 09/07/2015    . furosemide (LASIX) 20 MG tablet TAKE 1 TABLET (20 MG TOTAL) BY MOUTH DAILY. IN THE MORNING 90 tablet 1  . meloxicam (MOBIC) 7.5 MG tablet TAKE 1 TABLET (7.5 MG TOTAL) BY MOUTH DAILY. 30 tablet 5  . Multiple Vitamins-Minerals (MULTIVITAMIN WITH MINERALS) tablet Take 1 tablet by mouth daily. At 1400    . nebivolol (BYSTOLIC) 2.5 MG tablet TAKE 1 TABLET BY MOUTH DAILY 90 tablet 1  . niacin 250 MG tablet Take 250 mg by mouth daily. At 1400    . traMADol (ULTRAM) 50 MG tablet tramadol 50 mg tablet    . clopidogrel (PLAVIX) 75 MG tablet TAKE 1 TABLET (75 MG TOTAL) BY MOUTH DAILY. 90 tablet 3  . Docusate Sodium (DIOCTO) 150 MG/15ML syrup PLACE 3 DROPS IN EACH EAR DAILY TO PREVENT OCCLUSION WITH WAX 100 mL 0  . finasteride (PROSCAR) 5 MG tablet Take 1 tablet (5 mg total) by mouth daily. 90 tablet 3  . KLOR-CON M20 20 MEQ tablet TAKE 1 TABLET (20 MEQ TOTAL) BY MOUTH DAILY. 30 tablet 3  . memantine (NAMENDA) 10 MG tablet TAKE 1 TABLET (10 MG TOTAL) BY MOUTH 2 (TWO) TIMES DAILY. 180 tablet 0  . tamsulosin (  FLOMAX) 0.4 MG CAPS capsule Take 1 capsule (0.4 mg total) by mouth daily. 30 capsule 3   No facility-administered medications prior to visit.     Review of Systems;  Patient denies headache, fevers, malaise, unintentional weight loss, skin rash, eye pain, sinus congestion and sinus pain, sore throat, dysphagia,  hemoptysis , cough, dyspnea, wheezing, chest pain, palpitations, orthopnea, edema, abdominal pain, nausea, melena, diarrhea, constipation, flank pain, dysuria, hematuria, urinary  Frequency, nocturia, numbness, tingling, seizures,  Focal weakness, Loss of consciousness,  Tremor, insomnia, depression, anxiety, and suicidal ideation.      Objective:  BP 132/74 (BP Location: Left Arm, Patient Position: Sitting, Cuff Size: Large)   Pulse (!) 49   Temp 98.1  F (36.7 C) (Oral)   Resp 16   Ht 5\' 8"  (1.727 m)   Wt 230 lb (104.3 kg)   SpO2 94%   BMI 34.97 kg/m   BP Readings from Last 3 Encounters:  04/25/18 126/67  04/10/18 132/74  03/07/18 (!) 143/71    Wt Readings from Last 3 Encounters:  04/25/18 228 lb 3.2 oz (103.5 kg)  04/10/18 230 lb (104.3 kg)  03/07/18 228 lb 9.6 oz (103.7 kg)    General appearance: alert, cooperative and appears stated age Ears: normal TM's and external ear canals both ears Throat: lips, mucosa, and tongue normal; teeth and gums normal Neck: no adenopathy, no carotid bruit, supple, symmetrical, trachea midline and thyroid not enlarged, symmetric, no tenderness/mass/nodules Back: symmetric, no curvature. ROM normal. No CVA tenderness. Lungs: clear to auscultation bilaterally Heart: regular rate and rhythm, S1, S2 normal, no murmur, click, rub or gallop Abdomen: soft, non-tender; bowel sounds normal; no masses,  no organomegaly Pulses: 2+ and symmetric Skin: Skin color, texture, turgor normal. No rashes or lesions Lymph nodes: Cervical, supraclavicular, and axillary nodes normal.  Lab Results  Component Value Date   HGBA1C 5.6 10/06/2016    Lab Results  Component Value Date   CREATININE 1.03 04/10/2018   CREATININE 0.95 09/01/2017   CREATININE 0.88 04/05/2017    Lab Results  Component Value Date   WBC 8.9 09/01/2017   HGB 14.3 09/01/2017   HCT 41.8 09/01/2017   PLT 179.0 09/01/2017   GLUCOSE 118 (H) 04/10/2018   CHOL 103 04/10/2018   TRIG 148.0 04/10/2018   HDL 35.90 (L) 04/10/2018   LDLDIRECT 53.0 04/05/2017   LDLCALC 37 04/10/2018   ALT 13 04/10/2018   AST 17 04/10/2018   NA 142 04/10/2018   K 4.7 04/10/2018   CL 104 04/10/2018   CREATININE 1.03 04/10/2018   BUN 22 04/10/2018   CO2 32 04/10/2018   TSH 1.98 04/05/2017   INR 1.1 (H) 04/14/2016   HGBA1C 5.6 10/06/2016   MICROALBUR 2.2 (H) 10/06/2016     Assessment & Plan:   Problem List Items Addressed This Visit     Alzheimer's dementia without behavioral disturbance ([place])    His deficits  Are thus far limited to memory of names and events.  Continue Namenda. He has designated his wife,  followed by his children  as POA      Relevant Orders   DNR (Do Not Resuscitate)   Do not resuscitate discussion    DNR order has been written to enact the intent of the patient per discussion in 2009 (prior to onset of dementia)      Elevated liver enzymes     Resolved upon repeat today  Lab Results  Component Value Date   ALT 13 04/10/2018  AST 17 04/10/2018   ALKPHOS 109 04/10/2018   BILITOT 0.6 04/10/2018         Relevant Orders   Comprehensive metabolic panel (Completed)   Hyperlipidemia   Relevant Orders   Lipid panel (Completed)   Hypertension - Primary    Well controlled on current regimen. Renal function stable, no changes today.  Lab Results  Component Value Date   CREATININE 1.03 04/10/2018   Lab Results  Component Value Date   NA 142 04/10/2018   K 4.7 04/10/2018   CL 104 04/10/2018   CO2 32 04/10/2018         OSA (obstructive sleep apnea)    Moderate,  With AHI 21/hr and 64% of study time spen with sats < 90%.  He is wearing his CPAP every night a minimum of 6 hours per night and during daytime naps.  He notes improved daytime wakefulness and decreased fatigue . Advised to continue use of CPAP          I am having Christopher Jenkins maintain his niacin, fish oil-omega-3 fatty acids, multivitamin with minerals, Calcium-Vitamin D, traMADol, aspirin, meloxicam, furosemide, nebivolol, and atorvastatin.  No orders of the defined types were placed in this encounter.   There are no discontinued medications.  Follow-up: Return in about 6 months (around 10/10/2018).   [name], MD

## 2018-04-10 NOTE — Assessment & Plan Note (Signed)
DNR order has been written to enact the intent of the patient per discussion in 2009 (prior to onset of dementia)

## 2018-04-10 NOTE — Assessment & Plan Note (Signed)
Well controlled on current regimen. Renal function stable, no changes today.  Lab Results  Component Value Date   CREATININE 1.03 04/10/2018   Lab Results  Component Value Date   NA 142 04/10/2018   K 4.7 04/10/2018   CL 104 04/10/2018   CO2 32 04/10/2018

## 2018-04-10 NOTE — Patient Instructions (Addendum)
You have gained 11 lbs this year!    I want you  To walk for  30 minutes EVERY SINGLE DAY   RETURN IN 6 MONTHS

## 2018-04-11 ENCOUNTER — Other Ambulatory Visit: Payer: Self-pay | Admitting: Internal Medicine

## 2018-04-13 ENCOUNTER — Encounter: Payer: Self-pay | Admitting: *Deleted

## 2018-04-20 ENCOUNTER — Ambulatory Visit
Admission: RE | Admit: 2018-04-20 | Discharge: 2018-04-20 | Disposition: A | Discharge status: Home / Self Care | Payer: Medicare Other | Source: Ambulatory Visit | Attending: Urology | Admitting: Urology

## 2018-04-20 DIAGNOSIS — N400 Enlarged prostate without lower urinary tract symptoms: Secondary | ICD-10-CM | POA: Diagnosis not present

## 2018-04-20 DIAGNOSIS — N2889 Other specified disorders of kidney and ureter: Secondary | ICD-10-CM | POA: Insufficient documentation

## 2018-04-20 DIAGNOSIS — N281 Cyst of kidney, acquired: Secondary | ICD-10-CM | POA: Diagnosis not present

## 2018-04-21 ENCOUNTER — Other Ambulatory Visit: Payer: Self-pay | Admitting: Internal Medicine

## 2018-04-25 ENCOUNTER — Encounter: Payer: Self-pay | Admitting: Urology

## 2018-04-25 ENCOUNTER — Ambulatory Visit (INDEPENDENT_AMBULATORY_CARE_PROVIDER_SITE_OTHER): Payer: Medicare Other | Admitting: Urology

## 2018-04-25 ENCOUNTER — Other Ambulatory Visit: Payer: Self-pay

## 2018-04-25 ENCOUNTER — Ambulatory Visit: Payer: Medicare Other | Admitting: Urology

## 2018-04-25 VITALS — BP 126/67 | HR 64 | Ht 68.0 in | Wt 228.2 lb

## 2018-04-25 DIAGNOSIS — R351 Nocturia: Secondary | ICD-10-CM | POA: Diagnosis not present

## 2018-04-25 DIAGNOSIS — N138 Other obstructive and reflux uropathy: Secondary | ICD-10-CM

## 2018-04-25 DIAGNOSIS — N281 Cyst of kidney, acquired: Secondary | ICD-10-CM

## 2018-04-25 DIAGNOSIS — Z87448 Personal history of other diseases of urinary system: Secondary | ICD-10-CM | POA: Diagnosis not present

## 2018-04-25 DIAGNOSIS — N401 Enlarged prostate with lower urinary tract symptoms: Secondary | ICD-10-CM

## 2018-04-25 NOTE — Progress Notes (Signed)
9:40 AM   Christopher Jenkins [birth year] [id number]  Referring provider: [name], MD [place], [name]  Chief Complaint  Patient presents with  . Follow-up    HPI: Patient is an 84 year old Caucasian male with a history of hematuria, a Bosniak 55F cyst and BPH with LU TS who presents today for nocturia.     Nocturia Sleeping with CPAP machine.  Nocturia x 1.     History of hematuria He has underwent CT Urograms in 2017 and 07/2016.  He was found to have a Bosniak 55F on the CT in 07/2015 and it remained unchanged on 07/2016 CTU.  Cystoscopy was negative for malignancies.  He has not had any gross hematuria.   Bosniak 55F cyst RUS on 04/20/2018 noted bilateral simple and complex renal cysts.    BPH WITH LUTS Has appointment in 02/2019   PMH: Past Medical History:  Diagnosis Date  . BPH (benign prostatic hyperplasia)   . Duodenal mass   . Elevated PSA   . Gross hematuria   . Hyperlipidemia   . Hypertension   . Hypertrophy (benign) of prostate    asymptomatic on finasteride. will f/up with urologist Christopher Jenkins  . Kidney lesion 07/2015   pt has appt at kidney specialist 08/05/15 to review CT scan.  . Obesity   . Overweight   . Tubular adenoma of colon   . Urinary retention     Surgical History: Past Surgical History:  Procedure Laterality Date  . APPENDECTOMY    . cataracts    . right knee surgery Right 12-17/2017  . TOTAL HIP ARTHROPLASTY Right 08/13/2015   Procedure: TOTAL HIP ARTHROPLASTY;  Surgeon: Christopher Park, MD;  Location: [facility] ORS;  Service: Orthopedics;  Laterality: Right;  . Uvula Surgery  1990    Home Medications:  Allergies as of 04/25/2018      Reactions   No Known Allergies       Medication List        Accurate as of 04/25/18  9:40 AM. Always use your most recent med list.          aspirin 325 MG tablet aspirin 325 mg tablet,delayed release  TAKE 1 TABLET (325 MG TOTAL) BY MOUTH DAILY.     atorvastatin 40 MG tablet Commonly known as:  LIPITOR TAKE 1 TABLET (40 MG TOTAL) BY MOUTH DAILY.   Calcium-Vitamin D 600-125 MG-UNIT Tabs Take 1 tablet by mouth daily at 2 PM. Reported on 09/02/2015   clopidogrel 75 MG tablet Commonly known as:  PLAVIX TAKE 1 TABLET (75 MG TOTAL) BY MOUTH DAILY.   Docusate Sodium 150 MG/15ML syrup PLACE 3 DROPS IN EACH EAR DAILY TO PREVENT OCCLUSION WITH WAX   finasteride 5 MG tablet Commonly known as:  PROSCAR Take 1 tablet (5 mg total) by mouth daily.   fish oil-omega-3 fatty acids 1000 MG capsule Take 1 g by mouth daily. Reported on 09/07/2015   furosemide 20 MG tablet Commonly known as:  LASIX TAKE 1 TABLET (20 MG TOTAL) BY MOUTH DAILY. IN THE MORNING   KLOR-CON M20 20 MEQ tablet Generic drug:  potassium chloride SA TAKE 1 TABLET (20 MEQ TOTAL) BY MOUTH DAILY.   meloxicam 7.5 MG tablet Commonly known as:  MOBIC TAKE 1 TABLET (7.5 MG TOTAL) BY MOUTH DAILY.   memantine 10 MG tablet Commonly known as:  NAMENDA TAKE 1 TABLET (10 MG TOTAL) BY MOUTH 2 (TWO) TIMES DAILY.  multivitamin with minerals tablet Take 1 tablet by mouth daily. At 1400   nebivolol 2.5 MG tablet Commonly known as:  BYSTOLIC TAKE 1 TABLET BY MOUTH DAILY   niacin 250 MG tablet Take 250 mg by mouth daily. At 1400   tamsulosin 0.4 MG Caps capsule Commonly known as:  FLOMAX Take 1 capsule (0.4 mg total) by mouth daily.   traMADol 50 MG tablet Commonly known as:  ULTRAM tramadol 50 mg tablet       Allergies:  Allergies  Allergen Reactions  . No Known Allergies     Family History: Family History  Problem Relation Age of Onset  . Heart disease Mother   . Stroke Mother   . Heart disease Father   . Kidney disease Neg Hx   . Prostate cancer Neg Hx   . Kidney cancer Neg Hx   . Bladder Cancer Neg Hx     Social History:  reports that he has never smoked. He has never used smokeless tobacco. He reports that he does not drink alcohol or use  drugs.  ROS: UROLOGY Frequent Urination?: No Hard to postpone urination?: No Burning/pain with urination?: No Get up at night to urinate?: No Leakage of urine?: No Urine stream starts and stops?: No Trouble starting stream?: No Do you have to strain to urinate?: No Blood in urine?: No Urinary tract infection?: No Sexually transmitted disease?: No Injury to kidneys or bladder?: No Painful intercourse?: No Weak stream?: No Erection problems?: No Penile pain?: No  Gastrointestinal Nausea?: No Vomiting?: No Indigestion/heartburn?: No Diarrhea?: No Constipation?: No  Constitutional Fever: No Night sweats?: No Weight loss?: No Fatigue?: No  Skin Skin rash/lesions?: No Itching?: No  Eyes Blurred vision?: No Double vision?: No  Ears/Nose/Throat Sore throat?: No Sinus problems?: No  Hematologic/Lymphatic Swollen glands?: No Easy bruising?: No  Cardiovascular Leg swelling?: No Chest pain?: No  Respiratory Cough?: No Shortness of breath?: No  Endocrine Excessive thirst?: No  Musculoskeletal Back pain?: No Joint pain?: No  Neurological Headaches?: No Dizziness?: No  Psychologic Depression?: No Anxiety?: No  Physical Exam: BP 126/67   Pulse 64   Ht 5\' 8"  (1.727 m)   Wt 228 lb 3.2 oz (103.5 kg)   BMI 34.70 kg/m   Constitutional: Well nourished. Alert and oriented, No acute distress. HEENT: [place] AT, moist mucus membranes. Trachea midline, no masses. Cardiovascular: No clubbing, cyanosis, or edema. Respiratory: Normal respiratory effort, no increased work of breathing. Skin: No rashes, bruises or suspicious lesions. Lymph: No cervical or inguinal adenopathy. Neurologic: Grossly intact, no focal deficits, moving all 4 extremities. Psychiatric: Normal mood and affect.   Laboratory Data: Lab Results  Component Value Date   WBC 8.9 09/01/2017   HGB 14.3 09/01/2017   HCT 41.8 09/01/2017   MCV 94.3 09/01/2017   PLT 179.0 09/01/2017    Lab  Results  Component Value Date   CREATININE 1.03 04/10/2018    Results for orders placed or performed in visit on 04/10/18  Comprehensive metabolic panel  Result Value Ref Range   Sodium 142 135 - 145 mEq/L   Potassium 4.7 3.5 - 5.1 mEq/L   Chloride 104 96 - 112 mEq/L   CO2 32 19 - 32 mEq/L   Glucose, Bld 118 (H) 70 - 99 mg/dL   BUN 22 6 - 23 mg/dL   Creatinine, Ser 1.03 0.40 - 1.50 mg/dL   Total Bilirubin 0.6 0.2 - 1.2 mg/dL   Alkaline Phosphatase 109 39 - 117 U/L  AST 17 0 - 37 U/L   ALT 13 0 - 53 U/L   Total Protein 6.5 6.0 - 8.3 g/dL   Albumin 3.8 3.5 - 5.2 g/dL   Calcium 9.2 8.4 - 10.5 mg/dL   GFR 72.30 >60.00 mL/min  Lipid panel  Result Value Ref Range   Cholesterol 103 0 - 200 mg/dL   Triglycerides 148.0 0.0 - 149.0 mg/dL   HDL 35.90 (L) >39.00 mg/dL   VLDL 29.6 0.0 - 40.0 mg/dL   LDL Cholesterol 37 0 - 99 mg/dL   Total CHOL/HDL Ratio 3    NonHDL 66.95   I have reviewed the labs  Pertinent Imaging CLINICAL DATA:  Left renal mass.  EXAM: RENAL / URINARY TRACT ULTRASOUND COMPLETE  COMPARISON:  CT 05/15/2017, CT 08/04/2016.  Ultrasound 04/04/2017.  FINDINGS: Right Kidney:  Renal measurements: 15.3 x 6.2 x 5.7 cm = volume: 285 mL . Echogenicity within normal limits. No hydronephrosis visualized. 6.4 x 7.57.6 cm complex cyst in the right upper renal pole thin septum again noted.  Left Kidney:  Renal measurements: 14.9 x 5.6 x 4.9 cm = volume: 212 cc mL. Echogenicity within normal limits. No hydronephrosis visualized. 1.0 x 0.9 x 1.3 cm complex cyst lower pole. 5.3 x 4.4 x 5.8 cm simple cyst lower pole. 7.4 x 5.7 x 7.6 cm simple appearing cyst left upper pole. Similar findings noted on prior exam. As noted on prior exam more accurate evaluation may be obtained with MRI.  Bladder:  Appears normal for degree of bladder distention. Prostate is enlarged at 8.4 x 6.8 x 7.3 cm.  IMPRESSION: 1. Bilateral simple and complex renal cysts as above.  Similar findings noted on prior exams.  2.  Prostate enlargement.   Electronically Signed   By: Marcello Moores  Register   On: 04/20/2018 12:08 I have independently reviewed the films   Assessment & Plan:    1. Nocturia Patient states that he is still sleeping with a CPAP machine  2. History of hematuria Completed a hematuria workup with CT urograms and cystoscopy in December 2016 - no malignancies found No reports of gross hematuria   3. Left Bosniak 107F cyst Will obtain an MRI of the kidneys - I will call with results   4. BPH with LUTS Has follow up in 02/2019  Return for I will call patient with results.  Zara Council, PA-C  [facility] [address] [place], [place] [address] [phone]

## 2018-05-07 ENCOUNTER — Other Ambulatory Visit: Payer: Self-pay | Admitting: Internal Medicine

## 2018-05-11 ENCOUNTER — Telehealth: Payer: Self-pay | Admitting: Urology

## 2018-05-19 ENCOUNTER — Other Ambulatory Visit: Payer: Self-pay | Admitting: Urology

## 2018-05-19 ENCOUNTER — Ambulatory Visit
Admission: RE | Admit: 2018-05-19 | Discharge: 2018-05-19 | Disposition: A | Discharge status: Home / Self Care | Payer: Medicare Other | Source: Ambulatory Visit | Attending: Urology | Admitting: Urology

## 2018-05-19 DIAGNOSIS — K5730 Diverticulosis of large intestine without perforation or abscess without bleeding: Secondary | ICD-10-CM | POA: Diagnosis not present

## 2018-05-19 DIAGNOSIS — N401 Enlarged prostate with lower urinary tract symptoms: Secondary | ICD-10-CM

## 2018-05-19 DIAGNOSIS — K449 Diaphragmatic hernia without obstruction or gangrene: Secondary | ICD-10-CM | POA: Diagnosis not present

## 2018-05-19 DIAGNOSIS — I700 Atherosclerosis of aorta: Secondary | ICD-10-CM | POA: Insufficient documentation

## 2018-05-19 DIAGNOSIS — N281 Cyst of kidney, acquired: Secondary | ICD-10-CM | POA: Diagnosis not present

## 2018-05-19 MED ORDER — GADOBUTROL 1 MMOL/ML IV SOLN
10.0000 mL | Freq: Once | INTRAVENOUS | Status: AC | PRN
  Administered 2018-05-19: 10 mL via INTRAVENOUS

## 2018-05-21 ENCOUNTER — Telehealth: Payer: Self-pay | Admitting: Family Medicine

## 2018-05-21 ENCOUNTER — Other Ambulatory Visit: Payer: Self-pay | Admitting: Urology

## 2018-05-21 DIAGNOSIS — N281 Cyst of kidney, acquired: Secondary | ICD-10-CM

## 2018-05-21 NOTE — Telephone Encounter (Signed)
Patient notified and voiced understanding.

## 2018-05-21 NOTE — Progress Notes (Signed)
MRI of kidneys in one year orders in chart.

## 2018-05-21 NOTE — Telephone Encounter (Signed)
-----   Message from Nori Riis, PA-C sent at 05/21/2018  7:44 AM EST ----- Please let Mr. Fleek know that his MRI demonstrated a complex renal cyst in his left kidney and we will need to repeat the MRI in one year.

## 2018-05-30 ENCOUNTER — Other Ambulatory Visit: Payer: Self-pay | Admitting: Urology

## 2018-05-30 DIAGNOSIS — N138 Other obstructive and reflux uropathy: Secondary | ICD-10-CM

## 2018-05-30 DIAGNOSIS — N401 Enlarged prostate with lower urinary tract symptoms: Principal | ICD-10-CM

## 2018-06-08 ENCOUNTER — Telehealth: Payer: Self-pay | Admitting: Internal Medicine

## 2018-06-08 ENCOUNTER — Other Ambulatory Visit: Payer: Self-pay | Admitting: Internal Medicine

## 2018-06-08 NOTE — Telephone Encounter (Signed)
Copied from [place] [phone]. Topic: Quick Communication - See Telephone Encounter >> Jun 08, 2018  6:15 PM Blase Mess A wrote: CRM for notification. See Telephone encounter for: 06/08/18. Mica from [facility] care is calling for replacement C-Pap machine. Please advise [phone] Prescription with pressure settings, doctor's evaluation stating patient can use and benefit from the machine.

## 2018-06-11 NOTE — Telephone Encounter (Signed)
Notified [facility] that PCP out of office and that patient will need OV for CPAP notes form PCP. Verus will call and advise patient.

## 2018-06-15 NOTE — Telephone Encounter (Signed)
Physicians order placed in quick sign still need face to face notes stating patient would benefit from CPAP.

## 2018-06-15 NOTE — Assessment & Plan Note (Signed)
Moderate,  With AHI 21/hr and 64% of study time spen with sats < 90%.  He is wearing his CPAP every night a minimum of 6 hours per night and during daytime naps.  He notes improved daytime wakefulness and decreased fatigue . Advised to continue use of CPAP

## 2018-06-15 NOTE — Telephone Encounter (Signed)
Signed,  And office note from October printed

## 2018-06-18 NOTE — Telephone Encounter (Signed)
Form and office note has been faxed.

## 2018-06-26 ENCOUNTER — Other Ambulatory Visit: Payer: Self-pay | Admitting: Internal Medicine

## 2018-07-13 ENCOUNTER — Other Ambulatory Visit: Payer: Self-pay | Admitting: Internal Medicine

## 2018-07-24 ENCOUNTER — Other Ambulatory Visit: Payer: Self-pay | Admitting: Internal Medicine

## 2018-08-25 ENCOUNTER — Other Ambulatory Visit: Payer: Self-pay | Admitting: Internal Medicine

## 2018-09-03 ENCOUNTER — Telehealth: Payer: Self-pay

## 2018-09-03 NOTE — Telephone Encounter (Signed)
Copied from [place] [phone]. Topic: General - Other >> Sep 03, 2018  3:46 PM Yvette Rack wrote: Reason for CRM: Anderson Malta with [employer] called stating she will be faxing the form again and she will follow up tomorrow for a status update. Anderson Malta stated the form needs to be reviewed by the provider, signed and returned. Cb# [phone]

## 2018-09-04 NOTE — Telephone Encounter (Signed)
Called pt and he stated that he did not request a back brace from this company.

## 2018-09-23 ENCOUNTER — Other Ambulatory Visit: Payer: Self-pay | Admitting: Internal Medicine

## 2018-10-11 NOTE — Telephone Encounter (Signed)
error 

## 2018-10-12 ENCOUNTER — Ambulatory Visit: Payer: Medicare Other | Admitting: Internal Medicine

## 2018-10-16 ENCOUNTER — Other Ambulatory Visit: Payer: Self-pay

## 2018-10-16 ENCOUNTER — Ambulatory Visit (INDEPENDENT_AMBULATORY_CARE_PROVIDER_SITE_OTHER): Payer: Medicare Other | Admitting: Internal Medicine

## 2018-10-16 DIAGNOSIS — R7301 Impaired fasting glucose: Secondary | ICD-10-CM | POA: Diagnosis not present

## 2018-10-16 DIAGNOSIS — E7800 Pure hypercholesterolemia, unspecified: Secondary | ICD-10-CM | POA: Diagnosis not present

## 2018-10-16 DIAGNOSIS — I10 Essential (primary) hypertension: Secondary | ICD-10-CM | POA: Diagnosis not present

## 2018-10-16 DIAGNOSIS — I739 Peripheral vascular disease, unspecified: Secondary | ICD-10-CM | POA: Diagnosis not present

## 2018-10-16 DIAGNOSIS — G4733 Obstructive sleep apnea (adult) (pediatric): Secondary | ICD-10-CM | POA: Diagnosis not present

## 2018-10-16 DIAGNOSIS — K760 Fatty (change of) liver, not elsewhere classified: Secondary | ICD-10-CM

## 2018-10-16 NOTE — Progress Notes (Signed)
Virtual Visit via Telephone  Note  This visit type was conducted due to national recommendations for restrictions regarding the COVID-19 pandemic (e.g. social distancing).  This format is felt to be most appropriate for this patient at this time.  All issues noted in this document were discussed and addressed.  No physical exam was performed (except for noted visual exam findings with Video Visits).   I connected with@ on 10/18/18 at  2:30 PM EDT by telephone and verified that I am speaking with the correct person using two identifiers. Location patient: home Location provider: work  Persons participating in the virtual visit: patient, provider  I discussed the limitations, risks, security and privacy concerns of performing an evaluation and management service by telephone and the availability of in person appointments. I also discussed with the patient that there may be a patient responsible charge related to this service. The patient expressed understanding and agreed to proceed. Reason for visit: follow up on multiple issues including MCI,  PAD, OSA     HPI: 85 yr old male with obesity with fatty liver,  Impaired fasting glucose ,  OSA on CPAP diagnosed in 2014 ,  alzheimers dementia managed with [facility] presents for follow up  Feels generally well.  Still walking one mile every other day .     OSA:  Diagnosed by sleep study. he is wearing his CPAP every night a minimum of 6 hours per night and notes improved daytime wakefulness and decreased fatigue . However, he has received supplies for his CPAP machine that he states are not necessary for the machine that he has and is having trouble figuring out how to return the supplies since he did not order them.   During the visit we contacted the supplier to facilitate his returns ; unfortunately he has to make several phone calls to get them to stop sending unnecessary supplies  The patient has no signs or symptoms of COVID 19 infection (fever,  cough, sore throat  or shortness of breath beyond what is typical for patient).  Patient denies contact with other persons with the above mentioned symptoms or with anyone confirmed to have COVID 19 . He is sheltering at home.   Hypertension: patient checks blood pressure twice weekly at home.  Readings have been for the most part < 140/80 at rest . Patient is following a reduced salt diet most days and is taking medications as prescribed  Hyperlipidemia:  He is tolerating statin for risk reduction given known atherosclerosis.  Not losing weight.  Discussed diet and food choices   ROS: Patient denies headache, fevers, malaise, unintentional weight loss, skin rash, eye pain, sinus congestion and sinus pain, sore throat, dysphagia,  hemoptysis , cough, dyspnea, wheezing, chest pain, palpitations, orthopnea, edema, abdominal pain, nausea, melena, diarrhea, constipation, flank pain, dysuria, hematuria, urinary  Frequency, nocturia, numbness, tingling, seizures,  Focal weakness, Loss of consciousness,  Tremor, insomnia, depression, anxiety, and suicidal ideation.     Past Medical History:  Diagnosis Date  . BPH (benign prostatic hyperplasia)   . Duodenal mass   . Elevated PSA   . Gross hematuria   . Hyperlipidemia   . Hypertension   . Hypertrophy (benign) of prostate    asymptomatic on finasteride. will f/up with urologist [name]  . Kidney lesion 07/2015   pt has appt at kidney specialist 08/05/15 to review CT scan.  . Obesity   . Overweight   . Tubular adenoma of colon   . Urinary retention  Past Surgical History:  Procedure Laterality Date  . APPENDECTOMY    . cataracts    . right knee surgery Right 12-17/2017  . TOTAL HIP ARTHROPLASTY Right 08/13/2015   Procedure: TOTAL HIP ARTHROPLASTY;  Surgeon: [name], MD;  Location: [facility] ORS;  Service: Orthopedics;  Laterality: Right;  . Uvula Surgery  1990    Family History  Problem Relation Age of Onset  . Heart disease  Mother   . Stroke Mother   . Heart disease Father   . Kidney disease Neg Hx   . Prostate cancer Neg Hx   . Kidney cancer Neg Hx   . Bladder Cancer Neg Hx     SOCIAL HX: married lives independently at [facility]    Current Outpatient Medications:  .  Ascorbic Acid (VITAMIN C) 1000 MG tablet, Take 1,000 mg by mouth daily., Disp: , Rfl:  .  aspirin 325 MG tablet, , Disp: , Rfl:  .  atorvastatin (LIPITOR) 40 MG tablet, TAKE 1 TABLET BY MOUTH EVERY DAY, Disp: 90 tablet, Rfl: 0 .  Calcium Carbonate-Vitamin D (CALCIUM-VITAMIN D) 600-125 MG-UNIT TABS, Take 1 tablet by mouth daily at 2 PM. Reported on 09/02/2015, Disp: , Rfl:  .  clopidogrel (PLAVIX) 75 MG tablet, TAKE 1 TABLET (75 MG TOTAL) BY MOUTH DAILY., Disp: 90 tablet, Rfl: 3 .  Docusate Sodium (DIOCTO) 150 MG/15ML syrup, PLACE 3 DROPS IN EACH EAR DAILY TO PREVENT OCCLUSION WITH WAX, Disp: 100 mL, Rfl: 0 .  finasteride (PROSCAR) 5 MG tablet, TAKE 1 TABLET BY MOUTH EVERY DAY, Disp: 90 tablet, Rfl: 3 .  fish oil-omega-3 fatty acids 1000 MG capsule, Take 1 g by mouth daily. Reported on 09/07/2015, Disp: , Rfl:  .  furosemide (LASIX) 20 MG tablet, TAKE 1 TABLET (20 MG TOTAL) BY MOUTH DAILY. IN THE MORNING, Disp: 90 tablet, Rfl: 1 .  KLOR-CON M20 20 MEQ tablet, TAKE 1 TABLET (20 MEQ TOTAL) BY MOUTH DAILY., Disp: 90 tablet, Rfl: 1 .  memantine (NAMENDA) 10 MG tablet, TAKE 1 TABLET (10 MG TOTAL) BY MOUTH 2 (TWO) TIMES DAILY., Disp: 180 tablet, Rfl: 1 .  Multiple Vitamins-Minerals (MULTIVITAMIN WITH MINERALS) tablet, Take 1 tablet by mouth daily. At 1400, Disp: , Rfl:  .  nebivolol (BYSTOLIC) 2.5 MG tablet, TAKE 1 TABLET BY MOUTH EVERY DAY, Disp: 90 tablet, Rfl: 1 .  niacin 250 MG tablet, Take 250 mg by mouth daily. At 1400, Disp: , Rfl:  .  meloxicam (MOBIC) 7.5 MG tablet, TAKE 1 TABLET (7.5 MG TOTAL) BY MOUTH DAILY. (Patient not taking: Reported on 10/16/2018), Disp: 30 tablet, Rfl: 5 .  tamsulosin (FLOMAX) 0.4 MG CAPS capsule, TAKE 1 CAPSULE BY  MOUTH EVERY DAY, Disp: 30 capsule, Rfl: 3 .  traMADol (ULTRAM) 50 MG tablet, tramadol 50 mg tablet, Disp: , Rfl:   EXAM:   General impression: alert, cooperative and articulate.  No signs of being in distress  Lungs: speech is fluent sentence length suggests that patient is not short of breath and not punctuated by cough, sneezing or sniffing. Marland Kitchen   Psych: affect normal.  speech is articulate and non pressured .  Denies suicidal thoughts   ASSESSMENT AND PLAN:  Discussed the following assessment and plan:  Impaired fasting glucose - Plan: Hemoglobin A1c  NAFLD (nonalcoholic fatty liver disease) - Plan: Comprehensive metabolic panel  Pure hypercholesterolemia - Plan: Lipid panel  Essential hypertension  OSA (obstructive sleep apnea)  Peripheral artery disease (HCC)  Impaired fasting glucose Lab Results  Component Value Date   HGBA1C 5.6 10/06/2016   I have addressed  His IPG and obesity  and recommended a low glycemic index diet utilizing smaller more frequent meals to increase metabolism.  I have also recommended that patient start exercising with a goal of 30 minutes of aerobic exercise a minimum of 5 days per week. Screening for lipid disorders, thyroid and diabetes to be done soon    Hypertension Well controlled on current regimen. Renal function  Is due , no changes today.  OSA (obstructive sleep apnea) Moderate,  With AHI 21/hr and 64% of study time spetn with sats < 90%.  He is wearing his CPAP every night a minimum of 6 hours per night and during daytime naps.  He notes improved daytime wakefulness and decreased fatigue . Advised to continue use of CPAP   Peripheral artery disease Continued use of statin is advised given atherosclerosis of aorta noted on prior CT .  He is tolerating atorvastatin and LFTs are normal.   Lab Results  Component Value Date   CHOL 103 04/10/2018   HDL 35.90 (L) 04/10/2018   LDLCALC 37 04/10/2018   LDLDIRECT 53.0 04/05/2017   TRIG  148.0 04/10/2018   CHOLHDL 3 04/10/2018   Lab Results  Component Value Date   ALT 13 04/10/2018   AST 17 04/10/2018   ALKPHOS 109 04/10/2018   BILITOT 0.6 04/10/2018       I discussed the assessment and treatment plan with the patient. The patient was provided an opportunity to ask questions and all were answered. The patient agreed with the plan and demonstrated an understanding of the instructions.   The patient was advised to call back or seek an in-person evaluation if the symptoms worsen or if the condition fails to improve as anticipated.  I provided 25 minutes of non-face-to-face time during this encounter.   [name], MD

## 2018-10-18 ENCOUNTER — Other Ambulatory Visit: Payer: Self-pay | Admitting: Urology

## 2018-10-18 DIAGNOSIS — N401 Enlarged prostate with lower urinary tract symptoms: Principal | ICD-10-CM

## 2018-10-18 DIAGNOSIS — N138 Other obstructive and reflux uropathy: Secondary | ICD-10-CM

## 2018-10-18 NOTE — Assessment & Plan Note (Addendum)
Lab Results  Component Value Date   HGBA1C 5.6 10/06/2016   I have addressed  His IPG and obesity  and recommended a low glycemic index diet utilizing smaller more frequent meals to increase metabolism.  I have also recommended that patient start exercising with a goal of 30 minutes of aerobic exercise a minimum of 5 days per week. Screening for lipid disorders, thyroid and diabetes to be done soon

## 2018-10-18 NOTE — Assessment & Plan Note (Signed)
Moderate,  With AHI 21/hr and 64% of study time spetn with sats < 90%.  He is wearing his CPAP every night a minimum of 6 hours per night and during daytime naps.  He notes improved daytime wakefulness and decreased fatigue . Advised to continue use of CPAP

## 2018-10-18 NOTE — Assessment & Plan Note (Signed)
Continued use of statin is advised given atherosclerosis of aorta noted on prior CT .  He is tolerating atorvastatin and LFTs are normal.   Lab Results  Component Value Date   CHOL 103 04/10/2018   HDL 35.90 (L) 04/10/2018   LDLCALC 37 04/10/2018   LDLDIRECT 53.0 04/05/2017   TRIG 148.0 04/10/2018   CHOLHDL 3 04/10/2018   Lab Results  Component Value Date   ALT 13 04/10/2018   AST 17 04/10/2018   ALKPHOS 109 04/10/2018   BILITOT 0.6 04/10/2018

## 2018-10-18 NOTE — Assessment & Plan Note (Signed)
Well controlled on current regimen. Renal function  Is due , no changes today.

## 2018-12-16 ENCOUNTER — Other Ambulatory Visit: Payer: Self-pay | Admitting: Internal Medicine

## 2019-01-15 ENCOUNTER — Other Ambulatory Visit: Payer: Self-pay | Admitting: Internal Medicine

## 2019-01-16 ENCOUNTER — Other Ambulatory Visit: Payer: Self-pay | Admitting: Internal Medicine

## 2019-01-22 ENCOUNTER — Other Ambulatory Visit: Payer: Self-pay | Admitting: Internal Medicine

## 2019-02-19 ENCOUNTER — Other Ambulatory Visit: Payer: Self-pay | Admitting: Urology

## 2019-02-19 DIAGNOSIS — N138 Other obstructive and reflux uropathy: Secondary | ICD-10-CM

## 2019-02-20 ENCOUNTER — Other Ambulatory Visit: Payer: Self-pay | Admitting: Internal Medicine

## 2019-03-05 ENCOUNTER — Telehealth: Payer: Self-pay | Admitting: Internal Medicine

## 2019-03-06 DIAGNOSIS — H6123 Impacted cerumen, bilateral: Secondary | ICD-10-CM | POA: Diagnosis not present

## 2019-03-06 DIAGNOSIS — H903 Sensorineural hearing loss, bilateral: Secondary | ICD-10-CM | POA: Diagnosis not present

## 2019-03-06 DIAGNOSIS — Z23 Encounter for immunization: Secondary | ICD-10-CM | POA: Diagnosis not present

## 2019-03-08 DIAGNOSIS — D3132 Benign neoplasm of left choroid: Secondary | ICD-10-CM | POA: Diagnosis not present

## 2019-03-12 ENCOUNTER — Ambulatory Visit: Payer: Medicare Other | Admitting: Urology

## 2019-04-04 ENCOUNTER — Other Ambulatory Visit: Payer: Self-pay | Admitting: Internal Medicine

## 2019-04-11 ENCOUNTER — Other Ambulatory Visit: Payer: Self-pay

## 2019-04-12 ENCOUNTER — Ambulatory Visit (INDEPENDENT_AMBULATORY_CARE_PROVIDER_SITE_OTHER): Payer: Medicare Other | Admitting: Internal Medicine

## 2019-04-12 ENCOUNTER — Encounter: Payer: Self-pay | Admitting: Internal Medicine

## 2019-04-12 ENCOUNTER — Telehealth: Payer: Self-pay

## 2019-04-12 ENCOUNTER — Ambulatory Visit: Payer: Medicare Other

## 2019-04-12 ENCOUNTER — Other Ambulatory Visit: Payer: Self-pay

## 2019-04-12 VITALS — BP 140/64 | HR 55 | Temp 97.4°F | Resp 17 | Ht 68.0 in | Wt 222.2 lb

## 2019-04-12 DIAGNOSIS — K760 Fatty (change of) liver, not elsewhere classified: Secondary | ICD-10-CM | POA: Diagnosis not present

## 2019-04-12 DIAGNOSIS — F0280 Dementia in other diseases classified elsewhere without behavioral disturbance: Secondary | ICD-10-CM | POA: Diagnosis not present

## 2019-04-12 DIAGNOSIS — G4733 Obstructive sleep apnea (adult) (pediatric): Secondary | ICD-10-CM

## 2019-04-12 DIAGNOSIS — R7301 Impaired fasting glucose: Secondary | ICD-10-CM

## 2019-04-12 DIAGNOSIS — I10 Essential (primary) hypertension: Secondary | ICD-10-CM

## 2019-04-12 DIAGNOSIS — I739 Peripheral vascular disease, unspecified: Secondary | ICD-10-CM

## 2019-04-12 DIAGNOSIS — E7800 Pure hypercholesterolemia, unspecified: Secondary | ICD-10-CM

## 2019-04-12 DIAGNOSIS — G309 Alzheimer's disease, unspecified: Secondary | ICD-10-CM | POA: Diagnosis not present

## 2019-04-12 DIAGNOSIS — Z23 Encounter for immunization: Secondary | ICD-10-CM

## 2019-04-12 LAB — LIPID PANEL
Cholesterol: 196 mg/dL (ref 0–200)
HDL: 35.7 mg/dL — ABNORMAL LOW (ref >39.00)
NonHDL: 160.55
Total CHOL/HDL Ratio: 5
Triglycerides: 243 mg/dL — ABNORMAL HIGH (ref 0.0–149.0)
VLDL: 48.6 mg/dL — ABNORMAL HIGH (ref 0.0–40.0)

## 2019-04-12 LAB — COMPREHENSIVE METABOLIC PANEL
ALT: 22 U/L (ref 0–53)
AST: 19 U/L (ref 0–37)
Albumin: 3.8 g/dL (ref 3.5–5.2)
Alkaline Phosphatase: 101 U/L (ref 39–117)
BUN: 18 mg/dL (ref 6–23)
CO2: 26 mEq/L (ref 19–32)
Calcium: 8.6 mg/dL (ref 8.4–10.5)
Chloride: 103 mEq/L (ref 96–112)
Creatinine, Ser: 0.95 mg/dL (ref 0.40–1.50)
GFR: 74.51 mL/min (ref >60.00)
Glucose, Bld: 111 mg/dL — ABNORMAL HIGH (ref 70–99)
Potassium: 4 mEq/L (ref 3.5–5.1)
Sodium: 139 mEq/L (ref 135–145)
Total Bilirubin: 0.5 mg/dL (ref 0.2–1.2)
Total Protein: 6 g/dL (ref 6.0–8.3)

## 2019-04-12 LAB — LDL CHOLESTEROL, DIRECT: Direct LDL: 109 mg/dL

## 2019-04-12 LAB — HEMOGLOBIN A1C: Hgb A1c MFr Bld: 5.5 % (ref 4.6–6.5)

## 2019-04-12 NOTE — Telephone Encounter (Signed)
Called patient for scheduled annual wellness visit. No answer, no vm. Patient has visit with PMD today at 2pm. Please reschedule patient for annual wellness visit as appropriate.

## 2019-04-12 NOTE — Progress Notes (Signed)
Subjective:  Patient ID: Christopher Jenkins, male    DOB: 1926/12/08  Age: 85 y.o. MRN: [id number]  CC: The primary encounter diagnosis was Need for 23-polyvalent pneumococcal polysaccharide vaccine. Diagnoses of Impaired fasting glucose, Pure hypercholesterolemia, NAFLD (nonalcoholic fatty liver disease), Essential hypertension, OSA (obstructive sleep apnea), and Alzheimer's dementia without behavioral disturbance, unspecified timing of dementia onset ([place]) were also pertinent to this visit.  HPI Christopher Jenkins presents for follow up on hypertension,  Dementia , IPG    The patient has no signs or symptoms of COVID 19 infection (fever, cough, sore throat  or shortness of breath beyond what is typical for patient).  Patient denies contact with other persons with the above mentioned symptoms or with anyone confirmed to have COVID 19  Hypertension: patient checks blood pressure twice weekly at home.  Readings have been for the most part < 140/80 at rest . Patient is following a reduce salt diet most days and is taking medications as prescribed.  Walking e very other day for a mile .  Losing weight, although not intentionally.   eats twice daily   skips breakfast   Nocturia x 1 .  Sleeping well with CPAP machine. Being billed for it .  Got a bill for $800 recently  After getting a newer model  From [employer]  About a year ago. Having more memory issues .  Dtr and son in law visiting in November , who has POA  Received flu vaccine at [facility] 03/06/19.  Still has bandaid on arm  DUE FOR LAST PNEUMOVAX  HAD ONE OF TWO [facility] last year . However [facility] denies any record of vaccination   Patient is taking his medications as prescribed and notes no adverse effects.  Home BP readings have been done about once per week and are  generally < 130/80 .  She is avoiding added salt in her diet and walking regularly about 3 times per week for exercise  . Outpatient Medications Prior to Visit   Medication Sig Dispense Refill  . Ascorbic Acid (VITAMIN C) 1000 MG tablet Take 1,000 mg by mouth daily.    Marland Kitchen aspirin EC 81 MG tablet Take 81 mg by mouth daily.    Marland Kitchen atorvastatin (LIPITOR) 40 MG tablet TAKE 1 TABLET BY MOUTH EVERY DAY 90 tablet 0  . Calcium Carbonate-Vitamin D (CALCIUM-VITAMIN D) 600-125 MG-UNIT TABS Take 1 tablet by mouth daily at 2 PM. Reported on 09/02/2015    . clopidogrel (PLAVIX) 75 MG tablet TAKE 1 TABLET (75 MG TOTAL) BY MOUTH DAILY. 90 tablet 3  . Docusate Sodium (DOCU) 150 MG/15ML syrup PLACE 3 DROPS IN EACH EAR DAILY TO PREVENT OCCLUSION WITH WAX 100 mL 0  . finasteride (PROSCAR) 5 MG tablet TAKE 1 TABLET BY MOUTH EVERY DAY 90 tablet 3  . fish oil-omega-3 fatty acids 1000 MG capsule Take 1 g by mouth daily. Reported on 09/07/2015    . furosemide (LASIX) 20 MG tablet TAKE 1 TABLET (20 MG TOTAL) BY MOUTH DAILY. IN THE MORNING 90 tablet 1  . KLOR-CON M20 20 MEQ tablet TAKE 1 TABLET BY MOUTH EVERY DAY 90 tablet 1  . meloxicam (MOBIC) 7.5 MG tablet TAKE 1 TABLET (7.5 MG TOTAL) BY MOUTH DAILY. 30 tablet 5  . memantine (NAMENDA) 10 MG tablet TAKE 1 TABLET BY MOUTH TWICE A DAY 180 tablet 1  . Multiple Vitamins-Minerals (MULTIVITAMIN WITH MINERALS) tablet Take 1 tablet by mouth daily. At 1400    .  nebivolol (BYSTOLIC) 2.5 MG tablet TAKE 1 TABLET BY MOUTH EVERY DAY 90 tablet 1  . niacin 250 MG tablet Take 250 mg by mouth daily. At 1400    . tamsulosin (FLOMAX) 0.4 MG CAPS capsule TAKE 1 CAPSULE BY MOUTH EVERY DAY 30 capsule 3  . aspirin 325 MG tablet     . traMADol (ULTRAM) 50 MG tablet tramadol 50 mg tablet     No facility-administered medications prior to visit.     Review of Systems;  Patient denies headache, fevers, malaise, unintentional weight loss, skin rash, eye pain, sinus congestion and sinus pain, sore throat, dysphagia,  hemoptysis , cough, dyspnea, wheezing, chest pain, palpitations, orthopnea, edema, abdominal pain, nausea, melena, diarrhea, constipation, flank  pain, dysuria, hematuria, urinary  Frequency, nocturia, numbness, tingling, seizures,  Focal weakness, Loss of consciousness,  Tremor, insomnia, depression, anxiety, and suicidal ideation.      Objective:  BP 140/64 (BP Location: Left Arm, Patient Position: Sitting, Cuff Size: Normal)   Pulse (!) 55   Temp (!) 97.4 F (36.3 C) (Temporal)   Resp 17   Ht 5\' 8"  (1.727 m)   Wt 222 lb 3.2 oz (100.8 kg)   SpO2 95%   BMI 33.79 kg/m   BP Readings from Last 3 Encounters:  04/12/19 140/64  04/25/18 126/67  04/10/18 132/74    Wt Readings from Last 3 Encounters:  04/12/19 222 lb 3.2 oz (100.8 kg)  04/25/18 228 lb 3.2 oz (103.5 kg)  04/10/18 230 lb (104.3 kg)    General appearance: alert, cooperative and appears stated age. However, the smell of body odor is strong Ears: normal TM's and external ear canals both ears Throat: lips, mucosa, and tongue normal; teeth and gums normal Neck: no adenopathy, no carotid bruit, supple, symmetrical, trachea midline and thyroid not enlarged, symmetric, no tenderness/mass/nodules Back: symmetric, no curvature. ROM normal. No CVA tenderness. Lungs: clear to auscultation bilaterally Heart: regular rate and rhythm, S1, S2 normal, no murmur, click, rub or gallop Abdomen: soft, non-tender; bowel sounds normal; no masses,  no organomegaly Pulses: 2+ and symmetric Skin: Skin color, texture, turgor normal. No rashes or lesions. BANDAID FROM SETP 16 VACCINATION still present on left deltoid  Lymph nodes: Cervical, supraclavicular, and axillary nodes normal.  Lab Results  Component Value Date   HGBA1C 5.5 04/12/2019   HGBA1C 5.6 10/06/2016    Lab Results  Component Value Date   CREATININE 0.95 04/12/2019   CREATININE 1.03 04/10/2018   CREATININE 0.95 09/01/2017    Lab Results  Component Value Date   WBC 8.9 09/01/2017   HGB 14.3 09/01/2017   HCT 41.8 09/01/2017   PLT 179.0 09/01/2017   GLUCOSE 111 (H) 04/12/2019   CHOL 196 04/12/2019   TRIG  243.0 (H) 04/12/2019   HDL 35.70 (L) 04/12/2019   LDLDIRECT 109.0 04/12/2019   LDLCALC 37 04/10/2018   ALT 22 04/12/2019   AST 19 04/12/2019   NA 139 04/12/2019   K 4.0 04/12/2019   CL 103 04/12/2019   CREATININE 0.95 04/12/2019   BUN 18 04/12/2019   CO2 26 04/12/2019   TSH 1.98 04/05/2017   INR 1.1 (H) 04/14/2016   HGBA1C 5.5 04/12/2019   MICROALBUR 2.2 (H) 10/06/2016      Assessment & Plan:   Problem List Items Addressed This Visit      Unprioritized   Hypertension    Well controlled on current regimen. Renal function stable, no changes today.      Relevant Medications  aspirin EC 81 MG tablet   Alzheimer's dementia without behavioral disturbance ([place])    His cognitive deficits appear to be impacting his ability to manage finances and hygiene issues.  I have made an appointment for early November with him and son in law who has his  POA to discuss transitioning to A/L       OSA (obstructive sleep apnea)    Moderate,  With AHI 21/hr and 64% of study time spent with sats < 90%.  He is wearing his CPAP every night a minimum of 6 hours per night and during daytime naps.  He notes improved daytime wakefulness and decreased fatigue . Advised to continue use of CPAP       Impaired fasting glucose   Hyperlipidemia   Relevant Medications   aspirin EC 81 MG tablet   NAFLD (nonalcoholic fatty liver disease)    Other Visit Diagnoses    Need for 23-polyvalent pneumococcal polysaccharide vaccine    -  Primary   Relevant Orders   Pneumococcal polysaccharide vaccine 23-valent greater than or equal to 2yo subcutaneous/IM (Completed)      I have discontinued Sherren Kerns. Hebenstreit's aspirin. I am also having him maintain his niacin, fish oil-omega-3 fatty acids, multivitamin with minerals, Calcium-Vitamin D, traMADol, meloxicam, clopidogrel, finasteride, vitamin C, atorvastatin, Docusate Sodium, furosemide, nebivolol, tamsulosin, Klor-Con M20, memantine, and aspirin EC.  No orders of  the defined types were placed in this encounter.   Medications Discontinued During This Encounter  Medication Reason  . aspirin 325 MG tablet Change in therapy    Follow-up: No follow-ups on file.   [name], MD

## 2019-04-12 NOTE — Patient Instructions (Addendum)
Your son or son in law needs to start helping you with your bills to make sure you are paying them correctly  You receive the pneumonia vaccine today.   You did not get the 2nd dose of shingrx vaccine asap

## 2019-04-14 NOTE — Assessment & Plan Note (Signed)
Well controlled on current regimen. Renal function stable, no changes today. 

## 2019-04-14 NOTE — Assessment & Plan Note (Signed)
His cognitive deficits appear to be impacting his ability to manage finances and hygiene issues.  I have made an appointment for early November with him and son in law who has his  POA to discuss transitioning to A/L

## 2019-04-14 NOTE — Assessment & Plan Note (Signed)
Moderate,  With AHI 21/hr and 64% of study time spent with sats < 90%.  He is wearing his CPAP every night a minimum of 6 hours per night and during daytime naps.  He notes improved daytime wakefulness and decreased fatigue . Advised to continue use of CPAP

## 2019-04-14 NOTE — Assessment & Plan Note (Signed)
Continued use of statin is advised given atherosclerosis of aorta noted on prior CT .  He is tolerating atorvastatin and LFTs are normal.   Lab Results  Component Value Date   CHOL 196 04/12/2019   HDL 35.70 (L) 04/12/2019   LDLCALC 37 04/10/2018   LDLDIRECT 109.0 04/12/2019   TRIG 243.0 (H) 04/12/2019   CHOLHDL 5 04/12/2019   Lab Results  Component Value Date   ALT 22 04/12/2019   AST 19 04/12/2019   ALKPHOS 101 04/12/2019   BILITOT 0.5 04/12/2019

## 2019-04-16 NOTE — Telephone Encounter (Signed)
Error

## 2019-04-26 ENCOUNTER — Other Ambulatory Visit: Payer: Self-pay

## 2019-04-26 ENCOUNTER — Ambulatory Visit (INDEPENDENT_AMBULATORY_CARE_PROVIDER_SITE_OTHER): Payer: Medicare Other | Admitting: Internal Medicine

## 2019-04-26 ENCOUNTER — Encounter: Payer: Self-pay | Admitting: Internal Medicine

## 2019-04-26 DIAGNOSIS — F0280 Dementia in other diseases classified elsewhere without behavioral disturbance: Secondary | ICD-10-CM

## 2019-04-26 DIAGNOSIS — G309 Alzheimer's disease, unspecified: Secondary | ICD-10-CM | POA: Diagnosis not present

## 2019-04-26 NOTE — Patient Instructions (Signed)
Good to meet you all!  I will make the referral for the home health program at [facility] as well as the neurologist

## 2019-04-26 NOTE — Progress Notes (Signed)
Subjective:  Patient ID: Christopher Jenkins, male    DOB: Sep 04, 1926  Age: 85 y.o. MRN: [id number]  CC: The encounter diagnosis was Alzheimer's dementia without behavioral disturbance, unspecified timing of dementia onset ([place]).  HPI Christopher Jenkins presents for follow up on worsening dementia.  Patient was asked to return with family members  after being seen a few weeks ago and noted to be more confused about medications and less attentive to personal hygiene.  He is accompanied by his wife [name],  His daughter [name] and his son in law who are visiting from [facility].   His daughter is very concerned about his continued independent living status due to observed difficulties with short term memory and some long term memory.   He has not seen neurology formally.  MRI brain in 2015 showed age related atrophy with no hydrocephalus .  He has OSA that is being treated. Prior trials of aricept caused diarrhea.  He is tolerating namenda with side effects   Outpatient Medications Prior to Visit  Medication Sig Dispense Refill  . Ascorbic Acid (VITAMIN C) 1000 MG tablet Take 1,000 mg by mouth daily.    Marland Kitchen aspirin EC 81 MG tablet Take 81 mg by mouth daily.    . clopidogrel (PLAVIX) 75 MG tablet TAKE 1 TABLET (75 MG TOTAL) BY MOUTH DAILY. 90 tablet 3  . Docusate Sodium (DOCU) 150 MG/15ML syrup PLACE 3 DROPS IN EACH EAR DAILY TO PREVENT OCCLUSION WITH WAX 100 mL 0  . finasteride (PROSCAR) 5 MG tablet TAKE 1 TABLET BY MOUTH EVERY DAY 90 tablet 3  . fish oil-omega-3 fatty acids 1000 MG capsule Take 1 g by mouth daily. Reported on 09/07/2015    . furosemide (LASIX) 20 MG tablet TAKE 1 TABLET (20 MG TOTAL) BY MOUTH DAILY. IN THE MORNING 90 tablet 1  . KLOR-CON M20 20 MEQ tablet TAKE 1 TABLET BY MOUTH EVERY DAY 90 tablet 1  . memantine (NAMENDA) 10 MG tablet TAKE 1 TABLET BY MOUTH TWICE A DAY 180 tablet 1  . Multiple Vitamins-Minerals (MULTIVITAMIN WITH MINERALS) tablet Take 1 tablet by mouth daily. At 1400    .  nebivolol (BYSTOLIC) 2.5 MG tablet TAKE 1 TABLET BY MOUTH EVERY DAY 90 tablet 1  . niacin 250 MG tablet Take 250 mg by mouth daily. At 1400    . tamsulosin (FLOMAX) 0.4 MG CAPS capsule TAKE 1 CAPSULE BY MOUTH EVERY DAY 30 capsule 3  . atorvastatin (LIPITOR) 40 MG tablet TAKE 1 TABLET BY MOUTH EVERY DAY (Patient not taking: Reported on 04/26/2019) 90 tablet 0  . Calcium Carbonate-Vitamin D (CALCIUM-VITAMIN D) 600-125 MG-UNIT TABS Take 1 tablet by mouth daily at 2 PM. Reported on 09/02/2015    . meloxicam (MOBIC) 7.5 MG tablet TAKE 1 TABLET (7.5 MG TOTAL) BY MOUTH DAILY. (Patient not taking: Reported on 04/26/2019) 30 tablet 5  . traMADol (ULTRAM) 50 MG tablet tramadol 50 mg tablet     No facility-administered medications prior to visit.     Review of Systems;  Patient denies headache, fevers, malaise, unintentional weight loss, skin rash, eye pain, sinus congestion and sinus pain, sore throat, dysphagia,  hemoptysis , cough, dyspnea, wheezing, chest pain, palpitations, orthopnea, edema, abdominal pain, nausea, melena, diarrhea, constipation, flank pain, dysuria, hematuria, urinary  Frequency, nocturia, numbness, tingling, seizures,  Focal weakness, Loss of consciousness,  Tremor, insomnia, depression, anxiety, and suicidal ideation.      Objective:  BP (!) 150/70 (BP Location: Left Arm, Patient Position:  Sitting, Cuff Size: Normal)   Pulse (!) 58   Temp 97.8 F (36.6 C) (Temporal)   Ht 5\' 8"  (1.727 m)   Wt 222 lb 12.8 oz (101.1 kg)   BMI 33.88 kg/m   BP Readings from Last 3 Encounters:  04/26/19 (!) 150/70  04/12/19 140/64  04/25/18 126/67    Wt Readings from Last 3 Encounters:  04/26/19 222 lb 12.8 oz (101.1 kg)  04/12/19 222 lb 3.2 oz (100.8 kg)  04/25/18 228 lb 3.2 oz (103.5 kg)    General appearance: alert, cooperative and appears stated age Ears: normal TM's and external ear canals both ears Throat: lips, mucosa, and tongue normal; teeth and gums normal Neck: no adenopathy,  no carotid bruit, supple, symmetrical, trachea midline and thyroid not enlarged, symmetric, no tenderness/mass/nodules Back: symmetric, no curvature. ROM normal. No CVA tenderness. Lungs: clear to auscultation bilaterally Heart: regular rate and rhythm, S1, S2 normal, no murmur, click, rub or gallop Abdomen: soft, non-tender; bowel sounds normal; no masses,  no organomegaly Pulses: 2+ and symmetric Skin: Skin color, texture, turgor normal. No rashes or lesions Lymph nodes: Cervical, supraclavicular, and axillary nodes normal.  Lab Results  Component Value Date   HGBA1C 5.5 04/12/2019   HGBA1C 5.6 10/06/2016    Lab Results  Component Value Date   CREATININE 0.95 04/12/2019   CREATININE 1.03 04/10/2018   CREATININE 0.95 09/01/2017    Lab Results  Component Value Date   WBC 8.9 09/01/2017   HGB 14.3 09/01/2017   HCT 41.8 09/01/2017   PLT 179.0 09/01/2017   GLUCOSE 111 (H) 04/12/2019   CHOL 196 04/12/2019   TRIG 243.0 (H) 04/12/2019   HDL 35.70 (L) 04/12/2019   LDLDIRECT 109.0 04/12/2019   LDLCALC 37 04/10/2018   ALT 22 04/12/2019   AST 19 04/12/2019   NA 139 04/12/2019   K 4.0 04/12/2019   CL 103 04/12/2019   CREATININE 0.95 04/12/2019   BUN 18 04/12/2019   CO2 26 04/12/2019   TSH 1.98 04/05/2017   INR 1.1 (H) 04/14/2016   HGBA1C 5.5 04/12/2019   MICROALBUR 2.2 (H) 10/06/2016    Mr Abdomen W Wo Contrast  Result Date: 05/19/2018 CLINICAL DATA:  Follow-up complex left renal cyst. EXAM: MRI ABDOMEN WITHOUT AND WITH CONTRAST TECHNIQUE: Multiplanar multisequence MR imaging of the abdomen was performed both before and after the administration of intravenous contrast. CONTRAST:  10 cc Gadavist IV. COMPARISON:  04/20/2018 renal sonogram.  05/15/2017 CT abdomen. FINDINGS: Lower chest: No acute abnormality at the lung bases. Hepatobiliary: Normal liver size and configuration. No hepatic steatosis. A few scattered small T2 hyperintense lesions measuring up to the 1.3 cm in the  anterior left liver lobe (series 11/image 22) are all stable in size since 05/15/2017 CT and demonstrate no convincing enhancement, compatible with benign cysts. Normal gallbladder with no cholelithiasis. No biliary ductal dilatation. Common bile duct diameter 3 mm. No choledocholithiasis. Pancreas: No pancreatic mass or duct dilation.  No pancreas divisum. Spleen: Normal size. No mass. Adrenals/Urinary Tract: Normal adrenals. No hydronephrosis. There is a T2 hypointense T1 isointense 1.2 x 0.9 cm mass in the lateral lower left kidney (series 6/image 35), which demonstrates questionable hypoenhancement on the motion degraded postcontrast sequences, which measured 1.2 x 0.9 cm on 05/15/2017 CT using similar measurement technique, stable in size. There is an adjacent simple 5.4 cm renal cyst in the far lower left kidney. Several additional simple renal cysts in both kidneys, largest 8.1 cm in the upper right kidney.  Stomach/Bowel: Small hiatal hernia. Otherwise normal nondistended stomach. Visualized small and large bowel is normal caliber, with no bowel wall thickening. Moderate left colonic diverticulosis. Vascular/Lymphatic: Atherosclerotic nonaneurysmal abdominal aorta. Patent portal, splenic, hepatic and renal veins. No pathologically enlarged lymph nodes in the abdomen. Other: No abdominal ascites or focal fluid collection. Musculoskeletal: No aggressive appearing focal osseous lesions. IMPRESSION: 1. Small T2 hypointense 1.2 cm renal cortical mass in the lateral lower left kidney with questionable hypoenhancement on the motion degraded postcontrast sequences, stable in size since 05/15/2017 CT, best considered Bosniak category 46F. Follow-up MRI abdomen without and with IV contrast advised in 1 year. 2. Several benign renal cysts in both kidneys. 3. Small hiatal hernia. 4. Moderate left colonic diverticulosis. 5.  Aortic Atherosclerosis (ICD10-I70.0). Electronically Signed   By: Ilona Sorrel M.D.   On:  05/19/2018 12:04    Assessment & Plan:   Problem List Items Addressed This Visit      Unprioritized   Alzheimer's dementia without behavioral disturbance ([place])    Progression noted with particular inattention to hygiene and some confusion regarding medications . Formal neurology evaluation advised.  Discussed with family who requests home health aide or assistance in medication supervision, hygiene issues, and other day to day activities       Relevant Orders   Ambulatory referral to Neurology   Ambulatory referral to [place]    A total of 40 minutes was spent with patient more than half of which was spent in counseling patient on the above mentioned issues , reviewing and explaining recent labs and imaging studies done, and coordination of care.  I am having Christopher Jenkins maintain his niacin, fish oil-omega-3 fatty acids, multivitamin with minerals, Calcium-Vitamin D, traMADol, meloxicam, clopidogrel, finasteride, vitamin C, atorvastatin, Docusate Sodium, furosemide, nebivolol, tamsulosin, Klor-Con M20, memantine, and aspirin EC.  No orders of the defined types were placed in this encounter.   There are no discontinued medications.  Follow-up: No follow-ups on file.   [name], MD

## 2019-04-28 ENCOUNTER — Encounter: Payer: Self-pay | Admitting: Internal Medicine

## 2019-04-28 NOTE — Assessment & Plan Note (Addendum)
Progression noted with particular inattention to hygiene and some confusion regarding medications . Formal neurology evaluation advised.  Discussed with family who requests home health aide or assistance in medication supervision, hygiene issues, and other day to day activities

## 2019-04-30 ENCOUNTER — Telehealth: Payer: Self-pay | Admitting: *Deleted

## 2019-04-30 NOTE — Telephone Encounter (Signed)
[facility] rehab director is calling and the orders needs to said outpatient therapy and not home health  Please fax to  [phone]

## 2019-04-30 NOTE — Telephone Encounter (Signed)
Copied from [place] [id number]. Topic: Referral - Question >> Apr 30, 2019  8:14 AM Burchel, Abbi R wrote: Reason for CRM: [facility] Nurse called to get clarification for referral home health. Ok to leave detailed message.    [phone]

## 2019-05-02 ENCOUNTER — Telehealth: Payer: Self-pay | Admitting: Internal Medicine

## 2019-05-02 DIAGNOSIS — R413 Other amnesia: Secondary | ICD-10-CM | POA: Insufficient documentation

## 2019-05-02 NOTE — Telephone Encounter (Signed)
There is no way to order outpatient therapy only physical therapy    I changed the internal prompts to request outpatient assistance with medications etc.  Can you print out and send if that suffices ?

## 2019-05-02 NOTE — Telephone Encounter (Signed)
Copied from [place] [id number]. Topic: General - Other >> May 02, 2019  9:12 AM Keene Breath wrote: Reason for CRM: Patient called to ask if he can have an updated list of his medications left at the front desk this morning because he needs it for an appt. He has today.  Please advise and let patient know if this is possible at [phone]

## 2019-05-02 NOTE — Telephone Encounter (Signed)
Placed at front desk

## 2019-05-02 NOTE — Addendum Note (Signed)
Addended by: [name] on: 05/02/2019 05:18 PM   Modules accepted: Orders

## 2019-05-10 NOTE — Telephone Encounter (Signed)
Printed out modified orders and faxed to [facility].  Left voicemail informing staff that orders have been faxed.

## 2019-05-21 ENCOUNTER — Ambulatory Visit
Admission: RE | Admit: 2019-05-21 | Discharge: 2019-05-21 | Disposition: A | Discharge status: Home / Self Care | Payer: Medicare Other | Source: Ambulatory Visit | Attending: Urology | Admitting: Urology

## 2019-05-21 ENCOUNTER — Other Ambulatory Visit: Payer: Self-pay

## 2019-05-21 DIAGNOSIS — N281 Cyst of kidney, acquired: Secondary | ICD-10-CM | POA: Diagnosis not present

## 2019-05-21 MED ORDER — GADOBUTROL 1 MMOL/ML IV SOLN
10.0000 mL | Freq: Once | INTRAVENOUS | Status: AC | PRN
  Administered 2019-05-21: 13:00:00 10 mL via INTRAVENOUS

## 2019-06-03 NOTE — Progress Notes (Deleted)
9:16 PM   Christopher Jenkins [birth year] [id number]  Referring provider: [name], MD [place],  [place] [address]  No chief complaint on file.   HPI: Patient is an 85 year old male with a history of hematuria, a Bosniak 43F cyst and BPH with LU TS who presents today for nocturia.     Nocturia Sleeping with CPAP machine.  Nocturia x 1.     History of hematuria (high risk) Non-smoker.  He has underwent CT Urograms in 2017 and 07/2016.  He was found to have a Bosniak 43F on the CT in 07/2015 and it remained unchanged on 07/2016 CTU.  Cystoscopy was negative for malignancies.  ***  Bosniak 43F cyst MRI 05/2019 The lesion of concern adjacent to the cystic lesion in the lower pole of the left kidney has minimally enlarged compared with most recent study, although shows no definite enhancement on this examination, most consistent with a complex cyst (Bosniak 2). 2. Additional simple renal cysts bilaterally. No enhancing renal masses identified.  BPH WITH LUTS  (prostate and/or bladder) IPSS score: *** PVR: ***   Previous score: 1/1   Previous PVR: 14 mL  Major complaint(s):  x *** years. Denies any dysuria, hematuria or suprapubic pain.   Currently taking: ***.  His has had ***.   Denies any recent fevers, chills, nausea or vomiting.  He has a family history of PCa, with ***.   He does not have a family history of PCa.***    Score:  1-7 Mild 8-19 Moderate 20-35 Severe  PMH: Past Medical History:  Diagnosis Date  . BPH (benign prostatic hyperplasia)   . Duodenal mass   . Elevated PSA   . Gross hematuria   . Hyperlipidemia   . Hypertension   . Hypertrophy (benign) of prostate    asymptomatic on finasteride. will f/up with urologist [name]  . Kidney lesion 07/2015   pt has appt at kidney specialist 08/05/15 to review CT scan.  . Obesity   . Overweight   . Tubular adenoma of colon   . Urinary retention     Surgical  History: Past Surgical History:  Procedure Laterality Date  . APPENDECTOMY    . cataracts    . right knee surgery Right 12-17/2017  . TOTAL HIP ARTHROPLASTY Right 08/13/2015   Procedure: TOTAL HIP ARTHROPLASTY;  Surgeon: [name], MD;  Location: [facility] ORS;  Service: Orthopedics;  Laterality: Right;  . Uvula Surgery  1990    Home Medications:  Allergies as of 06/04/2019      Reactions   No Known Allergies       Medication List       Accurate as of June 03, 2019  9:16 PM. If you have any questions, ask your nurse or doctor.        aspirin EC 81 MG tablet Take 81 mg by mouth daily.   atorvastatin 40 MG tablet Commonly known as: LIPITOR TAKE 1 TABLET BY MOUTH EVERY DAY   Calcium-Vitamin D 600-125 MG-UNIT Tabs Take 1 tablet by mouth daily at 2 PM. Reported on 09/02/2015   clopidogrel 75 MG tablet Commonly known as: PLAVIX TAKE 1 TABLET (75 MG TOTAL) BY MOUTH DAILY.   Docusate Sodium 150 MG/15ML syrup Commonly known as: Docu PLACE 3 DROPS IN EACH EAR DAILY TO PREVENT OCCLUSION WITH WAX   finasteride 5 MG tablet Commonly known as: PROSCAR TAKE 1 TABLET BY MOUTH EVERY DAY   fish  oil-omega-3 fatty acids 1000 MG capsule Take 1 g by mouth daily. Reported on 09/07/2015   furosemide 20 MG tablet Commonly known as: LASIX TAKE 1 TABLET (20 MG TOTAL) BY MOUTH DAILY. IN THE MORNING   Klor-Con M20 20 MEQ tablet Generic drug: potassium chloride SA TAKE 1 TABLET BY MOUTH EVERY DAY   meloxicam 7.5 MG tablet Commonly known as: MOBIC TAKE 1 TABLET (7.5 MG TOTAL) BY MOUTH DAILY.   memantine 10 MG tablet Commonly known as: NAMENDA TAKE 1 TABLET BY MOUTH TWICE A DAY   multivitamin with minerals tablet Take 1 tablet by mouth daily. At 1400   nebivolol 2.5 MG tablet Commonly known as: Bystolic TAKE 1 TABLET BY MOUTH EVERY DAY   niacin 250 MG tablet Take 250 mg by mouth daily. At 1400   tamsulosin 0.4 MG Caps capsule Commonly known as: FLOMAX TAKE 1 CAPSULE BY  MOUTH EVERY DAY   traMADol 50 MG tablet Commonly known as: ULTRAM tramadol 50 mg tablet   vitamin C 1000 MG tablet Take 1,000 mg by mouth daily.       Allergies:  Allergies  Allergen Reactions  . No Known Allergies     Family History: Family History  Problem Relation Age of Onset  . Heart disease Mother   . Stroke Mother   . Heart disease Father   . Kidney disease Neg Hx   . Prostate cancer Neg Hx   . Kidney cancer Neg Hx   . Bladder Cancer Neg Hx     Social History:  reports that he has never smoked. He has never used smokeless tobacco. He reports that he does not drink alcohol or use drugs.  ROS:                                        Physical Exam: There were no vitals taken for this visit.  Constitutional:  Well nourished. Alert and oriented, No acute distress. HEENT: [place] AT, moist mucus membranes.  Trachea midline, no masses. Cardiovascular: No clubbing, cyanosis, or edema. Respiratory: Normal respiratory effort, no increased work of breathing. GI: Abdomen is soft, non tender, non distended, no abdominal masses. Liver and spleen not palpable.  No hernias appreciated.  Stool sample for occult testing is not indicated.   GU: No CVA tenderness.  No bladder fullness or masses.  Patient with circumcised/uncircumcised phallus. ***Foreskin easily retracted***  Urethral meatus is patent.  No penile discharge. No penile lesions or rashes. Scrotum without lesions, cysts, rashes and/or edema.  Testicles are located scrotally bilaterally. No masses are appreciated in the testicles. Left and right epididymis are normal. Rectal: Patient with  normal sphincter tone. Anus and perineum without scarring or rashes. No rectal masses are appreciated. Prostate is approximately *** grams, *** nodules are appreciated. Seminal vesicles are normal. Skin: No rashes, bruises or suspicious lesions. Lymph: No cervical or inguinal adenopathy. Neurologic: Grossly intact, no  focal deficits, moving all 4 extremities. Psychiatric: Normal mood and affect.   Laboratory Data: Lab Results  Component Value Date   WBC 8.9 09/01/2017   HGB 14.3 09/01/2017   HCT 41.8 09/01/2017   MCV 94.3 09/01/2017   PLT 179.0 09/01/2017    Lab Results  Component Value Date   CREATININE 0.95 04/12/2019    Results for orders placed or performed in visit on 04/12/19  Hemoglobin A1c  Result Value Ref Range   Hgb  A1c MFr Bld 5.5 4.6 - 6.5 %  Lipid panel  Result Value Ref Range   Cholesterol 196 0 - 200 mg/dL   Triglycerides 243.0 (H) 0.0 - 149.0 mg/dL   HDL 35.70 (L) >39.00 mg/dL   VLDL 48.6 (H) 0.0 - 40.0 mg/dL   Total CHOL/HDL Ratio 5    NonHDL 160.55   Comprehensive metabolic panel  Result Value Ref Range   Sodium 139 135 - 145 mEq/L   Potassium 4.0 3.5 - 5.1 mEq/L   Chloride 103 96 - 112 mEq/L   CO2 26 19 - 32 mEq/L   Glucose, Bld 111 (H) 70 - 99 mg/dL   BUN 18 6 - 23 mg/dL   Creatinine, Ser 0.95 0.40 - 1.50 mg/dL   Total Bilirubin 0.5 0.2 - 1.2 mg/dL   Alkaline Phosphatase 101 39 - 117 U/L   AST 19 0 - 37 U/L   ALT 22 0 - 53 U/L   Total Protein 6.0 6.0 - 8.3 g/dL   Albumin 3.8 3.5 - 5.2 g/dL   Calcium 8.6 8.4 - 10.5 mg/dL   GFR 74.51 >60.00 mL/min  LDL cholesterol, direct  Result Value Ref Range   Direct LDL 109.0 mg/dL  I have reviewed the labs  Pertinent Imaging CLINICAL DATA:  History of complex left renal cyst. No current complaints or history of malignancy.  EXAM: MRI ABDOMEN WITHOUT AND WITH CONTRAST  TECHNIQUE: Multiplanar multisequence MR imaging of the abdomen was performed both before and after the administration of intravenous contrast.  CONTRAST:  46mL GADAVIST GADOBUTROL 1 MMOL/ML IV SOLN  COMPARISON:  Abdominal CT 05/15/2017, renal ultrasound 04/20/2018 and MRI 05/19/2018.  FINDINGS: Despite efforts by the technologist and patient, mild motion artifact is present on today's exam and could not be eliminated. This reduces  exam sensitivity and specificity.  Lower chest: There are no significant findings within the visualized lower chest.  Hepatobiliary: Scattered small hepatic cysts are stable. There are no suspicious lesions or abnormal enhancement. No evidence of gallstones, gallbladder wall thickening or biliary dilatation.  Pancreas: Unremarkable. No pancreatic ductal dilatation or surrounding inflammatory changes.  Spleen: Normal in size without focal abnormality.  Adrenals/Urinary Tract: Both adrenal glands appear normal. Bilateral renal cysts are again noted, measuring up to 7.7 x 7.5 cm in the upper pole of the right kidney and 6.9 x 6.1 cm in the upper pole of the left kidney. In the lower pole of the left kidney, there is a cyst measuring 6.0 x 5.3 cm. Along the superolateral aspect of this cyst, there is a small T2 hypointense lesion measuring 14 x 11 mm (image 22/11). This previously measured 12 x 9 mm. This lesion demonstrates mild intrinsic T1 shortening, and no enhancement following contrast on today's study, confirmed on the subtracted images. Current appearance favors a small hemorrhagic cyst. No enhancing renal masses or hydronephrosis identified. Mild bladder wall thickening noted on the coronal images.  Stomach/Bowel: No evidence of bowel wall thickening, distention or surrounding inflammatory change.There is a diverticulum involving the 3rd portion of the duodenum. There are diverticular changes throughout the descending and sigmoid colon.  Vascular/Lymphatic: There are no enlarged abdominal lymph nodes. No significant vascular findings.  Other: No evidence of abdominal wall mass or hernia.  Musculoskeletal: No acute or significant osseous findings. Mild lumbar spondylosis.  IMPRESSION: 1. The lesion of concern adjacent to the cystic lesion in the lower pole of the left kidney has minimally enlarged compared with most recent study, although shows no definite  enhancement on this examination, most consistent with a complex cyst (Bosniak 2). 2. Additional simple renal cysts bilaterally. No enhancing renal masses identified. 3. No acute abdominal findings. 4. Diverticular changes within the duodenum and distal colon.   Electronically Signed   By: Richardean Sale M.D.   On: 05/21/2019 13:53 ***  Assessment & Plan:    1. Nocturia Patient states that he is still sleeping with a CPAP machine  2. History of hematuria Completed a hematuria workup with CT urograms and cystoscopy in December 2016 - no malignancies found No reports of gross hematuria   3. Left Bosniak 73F cyst MRI demonstrates a Bosniak 2 cyst - no further follow up is warranted   4. BPH with LUTS Has follow up in 02/2019  No follow-ups on file.  Zara Council, PA-C  [facility] [address] [place], [place] [address] [phone]

## 2019-06-04 ENCOUNTER — Ambulatory Visit: Payer: Medicare Other | Admitting: Urology

## 2019-06-04 ENCOUNTER — Other Ambulatory Visit: Payer: Self-pay | Admitting: Urology

## 2019-06-04 DIAGNOSIS — Z87448 Personal history of other diseases of urinary system: Secondary | ICD-10-CM

## 2019-06-04 DIAGNOSIS — N281 Cyst of kidney, acquired: Secondary | ICD-10-CM

## 2019-06-04 DIAGNOSIS — N138 Other obstructive and reflux uropathy: Secondary | ICD-10-CM

## 2019-06-04 DIAGNOSIS — N401 Enlarged prostate with lower urinary tract symptoms: Secondary | ICD-10-CM

## 2019-06-07 ENCOUNTER — Other Ambulatory Visit: Payer: Self-pay | Admitting: Internal Medicine

## 2019-06-08 ENCOUNTER — Other Ambulatory Visit: Payer: Self-pay | Admitting: Internal Medicine

## 2019-06-08 ENCOUNTER — Other Ambulatory Visit: Payer: Self-pay | Admitting: Urology

## 2019-06-08 DIAGNOSIS — N401 Enlarged prostate with lower urinary tract symptoms: Secondary | ICD-10-CM

## 2019-06-11 ENCOUNTER — Other Ambulatory Visit: Payer: Self-pay | Admitting: Urology

## 2019-06-11 DIAGNOSIS — N138 Other obstructive and reflux uropathy: Secondary | ICD-10-CM

## 2019-06-11 DIAGNOSIS — N401 Enlarged prostate with lower urinary tract symptoms: Secondary | ICD-10-CM

## 2019-06-19 NOTE — Progress Notes (Deleted)
9:31 PM   Christopher Jenkins 1926-09-20 [id number]  Referring provider: [name], MD [place],  [place] [address]  No chief complaint on file.   HPI: Patient is an 85 year old male with nocturia, a history of hematuria, a Bosniak 26F cyst and BPH with LU TS who presents today for nocturia.     Nocturia Sleeping with CPAP machine.  Nocturia x 1.     History of hematuria (high risk) Non-smoker.  He has underwent CT Urograms in 2017 and 07/2016.  He was found to have a Bosniak 26F on the CT in 07/2015 and it remained unchanged on 07/2016 CTU.  Cystoscopy was negative for malignancies.  ***  Bosniak 26F cyst MRI 05/2019 The lesion of concern adjacent to the cystic lesion in the lower pole of the left kidney has minimally enlarged compared with most recent study, although shows no definite enhancement on this examination, most consistent with a complex cyst (Bosniak 2). 2. Additional simple renal cysts bilaterally. No enhancing renal masses identified.  BPH WITH LUTS  (prostate and/or bladder) IPSS score: *** PVR: ***   Previous score: 1/1   Previous PVR: 14 mL  Major complaint(s):  x *** years. Denies any dysuria, hematuria or suprapubic pain.   Currently taking: ***.  His has had ***.   Denies any recent fevers, chills, nausea or vomiting.  He has a family history of PCa, with ***.   He does not have a family history of PCa.***    Score:  1-7 Mild 8-19 Moderate 20-35 Severe  PMH: Past Medical History:  Diagnosis Date  . BPH (benign prostatic hyperplasia)   . Duodenal mass   . Elevated PSA   . Gross hematuria   . Hyperlipidemia   . Hypertension   . Hypertrophy (benign) of prostate    asymptomatic on finasteride. will f/up with urologist [name]  . Kidney lesion 07/2015   pt has appt at kidney specialist 08/05/15 to review CT scan.  . Obesity   . Overweight   . Tubular adenoma of colon   . Urinary retention     Surgical  History: Past Surgical History:  Procedure Laterality Date  . APPENDECTOMY    . cataracts    . right knee surgery Right 12-17/2017  . TOTAL HIP ARTHROPLASTY Right 08/13/2015   Procedure: TOTAL HIP ARTHROPLASTY;  Surgeon: [name], MD;  Location: [facility] ORS;  Service: Orthopedics;  Laterality: Right;  . Uvula Surgery  1990    Home Medications:  Allergies as of 06/20/2019      Reactions   No Known Allergies       Medication List       Accurate as of June 19, 2019  9:31 PM. If you have any questions, ask your nurse or doctor.        aspirin EC 81 MG tablet Take 81 mg by mouth daily.   atorvastatin 40 MG tablet Commonly known as: LIPITOR TAKE 1 TABLET BY MOUTH EVERY DAY   Calcium-Vitamin D 600-125 MG-UNIT Tabs Take 1 tablet by mouth daily at 2 PM. Reported on 09/02/2015   clopidogrel 75 MG tablet Commonly known as: PLAVIX TAKE 1 TABLET (75 MG TOTAL) BY MOUTH DAILY.   Docusate Sodium 150 MG/15ML syrup Commonly known as: Docu PLACE 3 DROPS IN EACH EAR DAILY TO PREVENT OCCLUSION WITH WAX   finasteride 5 MG tablet Commonly known as: PROSCAR TAKE 1 TABLET BY MOUTH EVERY DAY  fish oil-omega-3 fatty acids 1000 MG capsule Take 1 g by mouth daily. Reported on 09/07/2015   furosemide 20 MG tablet Commonly known as: LASIX TAKE 1 TABLET (20 MG TOTAL) BY MOUTH DAILY. IN THE MORNING   Klor-Con M20 20 MEQ tablet Generic drug: potassium chloride SA TAKE 1 TABLET BY MOUTH EVERY DAY   meloxicam 7.5 MG tablet Commonly known as: MOBIC TAKE 1 TABLET (7.5 MG TOTAL) BY MOUTH DAILY.   memantine 10 MG tablet Commonly known as: NAMENDA TAKE 1 TABLET BY MOUTH TWICE A DAY   multivitamin with minerals tablet Take 1 tablet by mouth daily. At 1400   nebivolol 2.5 MG tablet Commonly known as: Bystolic TAKE 1 TABLET BY MOUTH EVERY DAY   niacin 250 MG tablet Take 250 mg by mouth daily. At 1400   tamsulosin 0.4 MG Caps capsule Commonly known as: FLOMAX TAKE 1 CAPSULE BY  MOUTH EVERY DAY   traMADol 50 MG tablet Commonly known as: ULTRAM tramadol 50 mg tablet   vitamin C 1000 MG tablet Take 1,000 mg by mouth daily.       Allergies:  Allergies  Allergen Reactions  . No Known Allergies     Family History: Family History  Problem Relation Age of Onset  . Heart disease Mother   . Stroke Mother   . Heart disease Father   . Kidney disease Neg Hx   . Prostate cancer Neg Hx   . Kidney cancer Neg Hx   . Bladder Cancer Neg Hx     Social History:  reports that he has never smoked. He has never used smokeless tobacco. He reports that he does not drink alcohol or use drugs.  ROS:                                        Physical Exam: There were no vitals taken for this visit.  Constitutional:  Well nourished. Alert and oriented, No acute distress. HEENT: [place] AT, moist mucus membranes.  Trachea midline, no masses. Cardiovascular: No clubbing, cyanosis, or edema. Respiratory: Normal respiratory effort, no increased work of breathing. GI: Abdomen is soft, non tender, non distended, no abdominal masses. Liver and spleen not palpable.  No hernias appreciated.  Stool sample for occult testing is not indicated.   GU: No CVA tenderness.  No bladder fullness or masses.  Patient with circumcised/uncircumcised phallus. ***Foreskin easily retracted***  Urethral meatus is patent.  No penile discharge. No penile lesions or rashes. Scrotum without lesions, cysts, rashes and/or edema.  Testicles are located scrotally bilaterally. No masses are appreciated in the testicles. Left and right epididymis are normal. Rectal: Patient with  normal sphincter tone. Anus and perineum without scarring or rashes. No rectal masses are appreciated. Prostate is approximately *** grams, *** nodules are appreciated. Seminal vesicles are normal. Skin: No rashes, bruises or suspicious lesions. Lymph: No cervical or inguinal adenopathy. Neurologic: Grossly intact, no  focal deficits, moving all 4 extremities. Psychiatric: Normal mood and affect.   Laboratory Data: Lab Results  Component Value Date   WBC 8.9 09/01/2017   HGB 14.3 09/01/2017   HCT 41.8 09/01/2017   MCV 94.3 09/01/2017   PLT 179.0 09/01/2017    Lab Results  Component Value Date   CREATININE 0.95 04/12/2019    Results for orders placed or performed in visit on 04/12/19  Hemoglobin A1c  Result Value Ref Range  Hgb A1c MFr Bld 5.5 4.6 - 6.5 %  Lipid panel  Result Value Ref Range   Cholesterol 196 0 - 200 mg/dL   Triglycerides 243.0 (H) 0.0 - 149.0 mg/dL   HDL 35.70 (L) >39.00 mg/dL   VLDL 48.6 (H) 0.0 - 40.0 mg/dL   Total CHOL/HDL Ratio 5    NonHDL 160.55   Comprehensive metabolic panel  Result Value Ref Range   Sodium 139 135 - 145 mEq/L   Potassium 4.0 3.5 - 5.1 mEq/L   Chloride 103 96 - 112 mEq/L   CO2 26 19 - 32 mEq/L   Glucose, Bld 111 (H) 70 - 99 mg/dL   BUN 18 6 - 23 mg/dL   Creatinine, Ser 0.95 0.40 - 1.50 mg/dL   Total Bilirubin 0.5 0.2 - 1.2 mg/dL   Alkaline Phosphatase 101 39 - 117 U/L   AST 19 0 - 37 U/L   ALT 22 0 - 53 U/L   Total Protein 6.0 6.0 - 8.3 g/dL   Albumin 3.8 3.5 - 5.2 g/dL   Calcium 8.6 8.4 - 10.5 mg/dL   GFR 74.51 >60.00 mL/min  LDL cholesterol, direct  Result Value Ref Range   Direct LDL 109.0 mg/dL  I have reviewed the labs  Pertinent Imaging CLINICAL DATA:  History of complex left renal cyst. No current complaints or history of malignancy.  EXAM: MRI ABDOMEN WITHOUT AND WITH CONTRAST  TECHNIQUE: Multiplanar multisequence MR imaging of the abdomen was performed both before and after the administration of intravenous contrast.  CONTRAST:  57mL GADAVIST GADOBUTROL 1 MMOL/ML IV SOLN  COMPARISON:  Abdominal CT 05/15/2017, renal ultrasound 04/20/2018 and MRI 05/19/2018.  FINDINGS: Despite efforts by the technologist and patient, mild motion artifact is present on today's exam and could not be eliminated. This reduces  exam sensitivity and specificity.  Lower chest: There are no significant findings within the visualized lower chest.  Hepatobiliary: Scattered small hepatic cysts are stable. There are no suspicious lesions or abnormal enhancement. No evidence of gallstones, gallbladder wall thickening or biliary dilatation.  Pancreas: Unremarkable. No pancreatic ductal dilatation or surrounding inflammatory changes.  Spleen: Normal in size without focal abnormality.  Adrenals/Urinary Tract: Both adrenal glands appear normal. Bilateral renal cysts are again noted, measuring up to 7.7 x 7.5 cm in the upper pole of the right kidney and 6.9 x 6.1 cm in the upper pole of the left kidney. In the lower pole of the left kidney, there is a cyst measuring 6.0 x 5.3 cm. Along the superolateral aspect of this cyst, there is a small T2 hypointense lesion measuring 14 x 11 mm (image 22/11). This previously measured 12 x 9 mm. This lesion demonstrates mild intrinsic T1 shortening, and no enhancement following contrast on today's study, confirmed on the subtracted images. Current appearance favors a small hemorrhagic cyst. No enhancing renal masses or hydronephrosis identified. Mild bladder wall thickening noted on the coronal images.  Stomach/Bowel: No evidence of bowel wall thickening, distention or surrounding inflammatory change.There is a diverticulum involving the 3rd portion of the duodenum. There are diverticular changes throughout the descending and sigmoid colon.  Vascular/Lymphatic: There are no enlarged abdominal lymph nodes. No significant vascular findings.  Other: No evidence of abdominal wall mass or hernia.  Musculoskeletal: No acute or significant osseous findings. Mild lumbar spondylosis.  IMPRESSION: 1. The lesion of concern adjacent to the cystic lesion in the lower pole of the left kidney has minimally enlarged compared with most recent study, although shows no  definite  enhancement on this examination, most consistent with a complex cyst (Bosniak 2). 2. Additional simple renal cysts bilaterally. No enhancing renal masses identified. 3. No acute abdominal findings. 4. Diverticular changes within the duodenum and distal colon.   Electronically Signed   By: Richardean Sale M.D.   On: 05/21/2019 13:53 ***  Assessment & Plan:    1. Nocturia Patient states that he is still sleeping with a CPAP machine  2. History of hematuria Completed a hematuria workup with CT urograms and cystoscopy in December 2016 - no malignancies found No reports of gross hematuria   3. Left Bosniak 18F cyst MRI demonstrates a Bosniak 2 cyst - no further follow up is warranted   4. BPH with LUTS IPSS score is ***, it is stable/improving/worsening Continue conservative management, avoiding bladder irritants and timed voiding's Most bothersome symptoms is/are *** Initiate alpha-blocker (***), discussed side effects *** Initiate 5 alpha reductase inhibitor (***), discussed side effects *** Continue tamsulosin 0.4 mg daily, alfuzosin 10 mg daily, Rapaflo 8 mg daily, terazosin, doxazosin, Cialis 5 mg daily and finasteride 5 mg daily, dutasteride 0.5 mg daily***:refills given Cannot tolerate medication or medication failure, schedule cystoscopy *** RTC in *** months for IPSS, PSA, PVR and exam     No follow-ups on file.  Zara Council, PA-C  [facility] [address] [place], [place] [address] [phone]

## 2019-06-20 ENCOUNTER — Ambulatory Visit: Payer: Medicare Other | Admitting: Urology

## 2019-06-22 ENCOUNTER — Other Ambulatory Visit: Payer: Self-pay | Admitting: Urology

## 2019-06-22 DIAGNOSIS — N401 Enlarged prostate with lower urinary tract symptoms: Secondary | ICD-10-CM

## 2019-07-01 NOTE — Progress Notes (Signed)
6:44 PM   Christopher Jenkins 23-Sep-1926 [id number]  Referring provider: [name], MD [place],  [place] [address]  Chief Complaint  Patient presents with  . Benign Prostatic Hypertrophy    HPI: Patient is an 86 year old male with nocturia, a history of hematuria, a Bosniak 65F cyst and BPH with LU TS who presents today a follow up.    Nocturia Sleeping with CPAP machine.  Nocturia x 1.     History of hematuria (high risk) Non-smoker.  He has underwent CT Urograms in 2017 and 07/2016.  He was found to have a Bosniak 65F on the CT in 07/2015 and it remained unchanged on 07/2016 CTU.  Cystoscopy was negative for malignancies.  He started having painless gross hematuria yesterday.   UA 07/02/2019 dark red cloudy with 0-5 WBCs, greater than 30 RBCs and moderate bacteria on microscopic exam.  I examined the patient's urine specimen and the gross hematuria appears to be old blood.  ? Of prostatic bleeding as he has a 180 cc prostate.    Bosniak 65F cyst MRI 05/2019 The lesion of concern adjacent to the cystic lesion in the lower pole of the left kidney has minimally enlarged compared with most recent study, although shows no definite enhancement on this examination, most consistent with a complex cyst (Bosniak 2). Additional simple renal cysts bilaterally. No enhancing renal masses identified.  BPH WITH LUTS  (prostate and/or bladder) IPSS score: 5/1 PVR: 108 mL    Previous score: 1/1   Previous PVR: 14 mL  Major complaint(s):  Gross hematuria that started last evening without pain.  Denies any dysuria, hematuria or suprapubic pain.   Currently taking: finasteride 5 mg daily and tamsulosin 0.4 mg daily   Denies any recent fevers, chills, nausea or vomiting.  IPSS    Row Name 07/02/19 0900         International Prostate Symptom Score   How often have you had the sensation of not emptying your bladder?  Less than 1 in 5     How often have you had to  urinate less than every two hours?  Less than 1 in 5 times     How often have you found you stopped and started again several times when you urinated?  Not at All     How often have you found it difficult to postpone urination?  Less than 1 in 5 times     How often have you had a weak urinary stream?  Not at All     How often have you had to strain to start urination?  Not at All     How many times did you typically get up at night to urinate?  2 Times     Total IPSS Score  5       Quality of Life due to urinary symptoms   If you were to spend the rest of your life with your urinary condition just the way it is now how would you feel about that?  Pleased        Score:  1-7 Mild 8-19 Moderate 20-35 Severe  PMH: Past Medical History:  Diagnosis Date  . BPH (benign prostatic hyperplasia)   . Duodenal mass   . Elevated PSA   . Gross hematuria   . Hyperlipidemia   . Hypertension   . Hypertrophy (benign) of prostate    asymptomatic on finasteride. will f/up with urologist Christopher Jenkins  .  Kidney lesion 07/2015   pt has appt at kidney specialist 08/05/15 to review CT scan.  . Obesity   . Overweight   . Tubular adenoma of colon   . Urinary retention     Surgical History: Past Surgical History:  Procedure Laterality Date  . APPENDECTOMY    . cataracts    . right knee surgery Right 12-17/2017  . TOTAL HIP ARTHROPLASTY Right 08/13/2015   Procedure: TOTAL HIP ARTHROPLASTY;  Surgeon: [name], MD;  Location: [facility] ORS;  Service: Orthopedics;  Laterality: Right;  . Uvula Surgery  1990    Home Medications:  Allergies as of 07/02/2019      Reactions   No Known Allergies       Medication List       Accurate as of July 02, 2019 11:59 PM. If you have any questions, ask your nurse or doctor.        aspirin EC 81 MG tablet Take 81 mg by mouth daily.   atorvastatin 40 MG tablet Commonly known as: LIPITOR TAKE 1 TABLET BY MOUTH EVERY DAY   Calcium-Vitamin D 600-125  MG-UNIT Tabs Take 1 tablet by mouth daily at 2 PM. Reported on 09/02/2015   clopidogrel 75 MG tablet Commonly known as: PLAVIX TAKE 1 TABLET (75 MG TOTAL) BY MOUTH DAILY.   Docusate Sodium 150 MG/15ML syrup Commonly known as: Docu PLACE 3 DROPS IN EACH EAR DAILY TO PREVENT OCCLUSION WITH WAX   finasteride 5 MG tablet Commonly known as: PROSCAR Take 1 tablet (5 mg total) by mouth daily.   fish oil-omega-3 fatty acids 1000 MG capsule Take 1 g by mouth daily. Reported on 09/07/2015   furosemide 20 MG tablet Commonly known as: LASIX TAKE 1 TABLET (20 MG TOTAL) BY MOUTH DAILY. IN THE MORNING   Klor-Con M20 20 MEQ tablet Generic drug: potassium chloride SA TAKE 1 TABLET BY MOUTH EVERY DAY   meloxicam 7.5 MG tablet Commonly known as: MOBIC TAKE 1 TABLET (7.5 MG TOTAL) BY MOUTH DAILY.   memantine 10 MG tablet Commonly known as: NAMENDA TAKE 1 TABLET BY MOUTH TWICE A DAY   multivitamin with minerals tablet Take 1 tablet by mouth daily. At 1400   nebivolol 2.5 MG tablet Commonly known as: Bystolic TAKE 1 TABLET BY MOUTH EVERY DAY   niacin 250 MG tablet Take 250 mg by mouth daily. At 1400   sulfamethoxazole-trimethoprim 800-160 MG tablet Commonly known as: BACTRIM DS Take 1 tablet by mouth every 12 (twelve) hours. Started by: Zara Council, PA-C   tamsulosin 0.4 MG Caps capsule Commonly known as: FLOMAX Take 1 capsule (0.4 mg total) by mouth daily.   traMADol 50 MG tablet Commonly known as: ULTRAM tramadol 50 mg tablet   vitamin C 1000 MG tablet Take 1,000 mg by mouth daily.       Allergies:  Allergies  Allergen Reactions  . No Known Allergies     Family History: Family History  Problem Relation Age of Onset  . Heart disease Mother   . Stroke Mother   . Heart disease Father   . Kidney disease Neg Hx   . Prostate cancer Neg Hx   . Kidney cancer Neg Hx   . Bladder Cancer Neg Hx     Social History:  reports that he has never smoked. He has never used  smokeless tobacco. He reports that he does not drink alcohol or use drugs.  ROS: UROLOGY Frequent Urination?: No Hard to postpone urination?: No Burning/pain with  urination?: No Get up at night to urinate?: No Leakage of urine?: No Urine stream starts and stops?: No Trouble starting stream?: No Do you have to strain to urinate?: No Blood in urine?: Yes Urinary tract infection?: No Sexually transmitted disease?: No Injury to kidneys or bladder?: No Painful intercourse?: No Weak stream?: No Erection problems?: No Penile pain?: No  Gastrointestinal Nausea?: No Vomiting?: No Indigestion/heartburn?: No Diarrhea?: No Constipation?: No  Constitutional Fever: No Night sweats?: No Weight loss?: No Fatigue?: No  Skin Skin rash/lesions?: No Itching?: No  Eyes Blurred vision?: No Double vision?: No  Ears/Nose/Throat Sore throat?: No Sinus problems?: No  Hematologic/Lymphatic Swollen glands?: No Easy bruising?: No  Cardiovascular Leg swelling?: No Chest pain?: No  Respiratory Cough?: No Shortness of breath?: No  Endocrine Excessive thirst?: No  Musculoskeletal Back pain?: No Joint pain?: No  Neurological Headaches?: No Dizziness?: No  Psychologic Depression?: No Anxiety?: No  Physical Exam: BP 119/70   Pulse 60   Wt 221 lb (100.2 kg)   BMI 33.60 kg/m   Constitutional:  Well nourished. Alert and oriented, No acute distress. HEENT: [place] AT, moist mucus membranes.  Trachea midline, no masses. Cardiovascular: No clubbing, cyanosis, or edema. Respiratory: Normal respiratory effort, no increased work of breathing. Neurologic: Grossly intact, no focal deficits, moving all 4 extremities. Psychiatric: Normal mood and affect.  Laboratory Data: Lab Results  Component Value Date   WBC 8.9 09/01/2017   HGB 14.3 09/01/2017   HCT 41.8 09/01/2017   MCV 94.3 09/01/2017   PLT 179.0 09/01/2017    Lab Results  Component Value Date   CREATININE 0.95  04/12/2019  I have reviewed the labs.  Results for orders placed or performed in visit on 07/02/19  Urine Culture, Comprehensive   Specimen: Urine   UR  Result Value Ref Range   Urine Culture, Comprehensive Final report    Organism ID, Bacteria Comment   Microscopic Examination   URINE  Result Value Ref Range   WBC, UA 0-5 0 - 5 /hpf   RBC >30 (A) 0 - 2 /hpf   Epithelial Cells (non renal) 0-10 0 - 10 /hpf   Bacteria, UA Moderate (A) None seen/Few  Urinalysis, Complete  Result Value Ref Range   Color, UA Red (A) Yellow   Appearance Ur Cloudy (A) Clear   Microscopic Examination See below:   Bladder Scan (Post Void Residual) in office  Result Value Ref Range   Scan Result 108     Pertinent Imaging CLINICAL DATA:  History of complex left renal cyst. No current complaints or history of malignancy.  EXAM: MRI ABDOMEN WITHOUT AND WITH CONTRAST  TECHNIQUE: Multiplanar multisequence MR imaging of the abdomen was performed both before and after the administration of intravenous contrast.  CONTRAST:  75mL GADAVIST GADOBUTROL 1 MMOL/ML IV SOLN  COMPARISON:  Abdominal CT 05/15/2017, renal ultrasound 04/20/2018 and MRI 05/19/2018.  FINDINGS: Despite efforts by the technologist and patient, mild motion artifact is present on today's exam and could not be eliminated. This reduces exam sensitivity and specificity.  Lower chest: There are no significant findings within the visualized lower chest.  Hepatobiliary: Scattered small hepatic cysts are stable. There are no suspicious lesions or abnormal enhancement. No evidence of gallstones, gallbladder wall thickening or biliary dilatation.  Pancreas: Unremarkable. No pancreatic ductal dilatation or surrounding inflammatory changes.  Spleen: Normal in size without focal abnormality.  Adrenals/Urinary Tract: Both adrenal glands appear normal. Bilateral renal cysts are again noted, measuring up to 7.7 x  7.5 cm in  the upper pole of the right kidney and 6.9 x 6.1 cm in the upper pole of the left kidney. In the lower pole of the left kidney, there is a cyst measuring 6.0 x 5.3 cm. Along the superolateral aspect of this cyst, there is a small T2 hypointense lesion measuring 14 x 11 mm (image 22/11). This previously measured 12 x 9 mm. This lesion demonstrates mild intrinsic T1 shortening, and no enhancement following contrast on today's study, confirmed on the subtracted images. Current appearance favors a small hemorrhagic cyst. No enhancing renal masses or hydronephrosis identified. Mild bladder wall thickening noted on the coronal images.  Stomach/Bowel: No evidence of bowel wall thickening, distention or surrounding inflammatory change.There is a diverticulum involving the 3rd portion of the duodenum. There are diverticular changes throughout the descending and sigmoid colon.  Vascular/Lymphatic: There are no enlarged abdominal lymph nodes. No significant vascular findings.  Other: No evidence of abdominal wall mass or hernia.  Musculoskeletal: No acute or significant osseous findings. Mild lumbar spondylosis.  IMPRESSION: 1. The lesion of concern adjacent to the cystic lesion in the lower pole of the left kidney has minimally enlarged compared with most recent study, although shows no definite enhancement on this examination, most consistent with a complex cyst (Bosniak 2). 2. Additional simple renal cysts bilaterally. No enhancing renal masses identified. 3. No acute abdominal findings. 4. Diverticular changes within the duodenum and distal colon.   Electronically Signed   By: Richardean Sale M.D.   On: 05/21/2019 13:53 I have independently reviewed the films and note the renal cysts.   Assessment & Plan:    1. Nocturia Patient states that he is still sleeping with a CPAP machine  2. Gross hematuria Completed a hematuria workup with CT urograms and cystoscopy in  December 2016 - no malignancies found Reports gross hematuria that started yesterday evening it is painless UA sent for culture - started on Septra DS - will adjust antibiotic if necessary  If urine culture is negative, we will need to schedule cystoscopy for patient If urine culture is positive, we will treat with the appropriate antibiotics and have patient return in 3 weeks to recheck UA to ensure the blood has cleared Patient is advised if he should continue to experience gross hematuria after starting the antibiotic or had difficulty urinating, he should contact the office immediately  3. Left Bosniak 80F cyst MRI demonstrates a Bosniak 2 cyst - no further follow up is warranted   4. BPH with LUTS IPSS score is 5/1, it is worsening Continue conservative management, avoiding bladder irritants and timed voiding's Most bothersome symptoms is/are gross hematuria Continue tamsulosin 0.4 mg daily and finasteride 5 mg daily- refills given   Return in about 3 weeks (around 07/23/2019) for UA and recheck .  Zara Council, PA-C  [facility] [address] [place], [place] [address] [phone]

## 2019-07-02 ENCOUNTER — Ambulatory Visit (INDEPENDENT_AMBULATORY_CARE_PROVIDER_SITE_OTHER): Payer: Medicare Other | Admitting: Urology

## 2019-07-02 ENCOUNTER — Other Ambulatory Visit: Payer: Self-pay

## 2019-07-02 ENCOUNTER — Encounter: Payer: Self-pay | Admitting: Urology

## 2019-07-02 VITALS — BP 119/70 | HR 60 | Wt 221.0 lb

## 2019-07-02 DIAGNOSIS — Z87448 Personal history of other diseases of urinary system: Secondary | ICD-10-CM | POA: Diagnosis not present

## 2019-07-02 DIAGNOSIS — N281 Cyst of kidney, acquired: Secondary | ICD-10-CM

## 2019-07-02 DIAGNOSIS — R310 Gross hematuria: Secondary | ICD-10-CM

## 2019-07-02 DIAGNOSIS — N138 Other obstructive and reflux uropathy: Secondary | ICD-10-CM | POA: Diagnosis not present

## 2019-07-02 DIAGNOSIS — R351 Nocturia: Secondary | ICD-10-CM

## 2019-07-02 DIAGNOSIS — N401 Enlarged prostate with lower urinary tract symptoms: Secondary | ICD-10-CM

## 2019-07-02 LAB — BLADDER SCAN AMB NON-IMAGING: Scan Result: 108

## 2019-07-02 MED ORDER — FINASTERIDE 5 MG PO TABS: 5.0000 mg | ORAL_TABLET | Freq: Every day | ORAL | 3 refills | Status: DC

## 2019-07-02 MED ORDER — TAMSULOSIN HCL 0.4 MG PO CAPS: 0.4000 mg | ORAL_CAPSULE | Freq: Every day | ORAL | 3 refills | Status: DC

## 2019-07-02 MED ORDER — SULFAMETHOXAZOLE-TRIMETHOPRIM 800-160 MG PO TABS: 1.0000 | ORAL_TABLET | Freq: Two times a day (BID) | ORAL | 0 refills | Status: DC

## 2019-07-03 ENCOUNTER — Telehealth: Payer: Self-pay | Admitting: Internal Medicine

## 2019-07-03 DIAGNOSIS — R413 Other amnesia: Secondary | ICD-10-CM | POA: Diagnosis not present

## 2019-07-03 DIAGNOSIS — E519 Thiamine deficiency, unspecified: Secondary | ICD-10-CM | POA: Diagnosis not present

## 2019-07-03 DIAGNOSIS — E538 Deficiency of other specified B group vitamins: Secondary | ICD-10-CM | POA: Diagnosis not present

## 2019-07-03 LAB — URINALYSIS, COMPLETE

## 2019-07-03 LAB — MICROSCOPIC EXAMINATION: RBC, Urine: >30 /hpf — AB (ref 0–2)

## 2019-07-03 NOTE — Telephone Encounter (Signed)
Pt dropped off long term health care papers to be completed by Dr. Derrel Nip. Paper work is up front in [employer]

## 2019-07-03 NOTE — Telephone Encounter (Signed)
Placed in red folder  

## 2019-07-04 DIAGNOSIS — Z23 Encounter for immunization: Secondary | ICD-10-CM | POA: Diagnosis not present

## 2019-07-04 DIAGNOSIS — Z0279 Encounter for issue of other medical certificate: Secondary | ICD-10-CM

## 2019-07-04 LAB — CULTURE, URINE COMPREHENSIVE

## 2019-07-04 NOTE — Telephone Encounter (Signed)
Completed.  Will be ready for pickup Friday afternoon

## 2019-07-05 ENCOUNTER — Telehealth: Payer: Self-pay | Admitting: Family Medicine

## 2019-07-05 NOTE — Telephone Encounter (Signed)
-----   Message from Nori Riis, PA-C sent at 07/04/2019  5:04 PM EST ----- Please let Christopher Jenkins know that his urine culture was negative and we need to schedule a cystoscopy at this time.

## 2019-07-05 NOTE — Telephone Encounter (Signed)
Left message for patient to return call. He will need to schedule a Cysto.

## 2019-07-08 NOTE — Telephone Encounter (Signed)
Patient notified and appointment has been scheduled 

## 2019-07-08 NOTE — Telephone Encounter (Signed)
Spoke with pt to let him know that the paperwork is complete and ready for pick up. Pt gave a verbal understanding.

## 2019-07-22 ENCOUNTER — Other Ambulatory Visit: Payer: Self-pay

## 2019-07-22 DIAGNOSIS — R310 Gross hematuria: Secondary | ICD-10-CM

## 2019-07-23 ENCOUNTER — Encounter: Payer: Self-pay | Admitting: Urology

## 2019-07-23 ENCOUNTER — Other Ambulatory Visit: Payer: Medicare Other

## 2019-07-27 ENCOUNTER — Other Ambulatory Visit: Payer: Self-pay | Admitting: Internal Medicine

## 2019-07-29 ENCOUNTER — Other Ambulatory Visit: Payer: Self-pay

## 2019-07-29 ENCOUNTER — Other Ambulatory Visit: Payer: Medicare Other

## 2019-07-29 DIAGNOSIS — R310 Gross hematuria: Secondary | ICD-10-CM | POA: Diagnosis not present

## 2019-07-29 LAB — URINALYSIS, COMPLETE
Bilirubin, UA: NEGATIVE
Glucose, UA: NEGATIVE
Leukocytes,UA: NEGATIVE
Nitrite, UA: NEGATIVE
Protein,UA: NEGATIVE
RBC, UA: NEGATIVE
Specific Gravity, UA: 1.025 (ref 1.005–1.030)
Urobilinogen, Ur: 1 mg/dL (ref 0.2–1.0)
pH, UA: 6 (ref 5.0–7.5)

## 2019-07-29 LAB — MICROSCOPIC EXAMINATION: RBC, Urine: NONE SEEN /hpf (ref 0–2)

## 2019-08-01 DIAGNOSIS — Z23 Encounter for immunization: Secondary | ICD-10-CM | POA: Diagnosis not present

## 2019-08-05 ENCOUNTER — Other Ambulatory Visit: Payer: Self-pay | Admitting: Internal Medicine

## 2019-08-14 ENCOUNTER — Other Ambulatory Visit: Payer: Self-pay

## 2019-08-14 ENCOUNTER — Encounter: Payer: Self-pay | Admitting: Urology

## 2019-08-14 ENCOUNTER — Ambulatory Visit (INDEPENDENT_AMBULATORY_CARE_PROVIDER_SITE_OTHER): Payer: Medicare Other | Admitting: Urology

## 2019-08-14 VITALS — BP 154/77 | HR 62 | Ht 68.0 in | Wt 221.0 lb

## 2019-08-14 DIAGNOSIS — R310 Gross hematuria: Secondary | ICD-10-CM

## 2019-08-14 DIAGNOSIS — R3129 Other microscopic hematuria: Secondary | ICD-10-CM | POA: Diagnosis not present

## 2019-08-14 LAB — URINALYSIS, COMPLETE
Bilirubin, UA: NEGATIVE
Glucose, UA: NEGATIVE
Ketones, UA: NEGATIVE
Leukocytes,UA: NEGATIVE
Nitrite, UA: NEGATIVE
RBC, UA: NEGATIVE
Specific Gravity, UA: >1.03 — ABNORMAL HIGH (ref 1.005–1.030)
Urobilinogen, Ur: 0.2 mg/dL (ref 0.2–1.0)
pH, UA: 5.5 (ref 5.0–7.5)

## 2019-08-14 LAB — MICROSCOPIC EXAMINATION
Bacteria, UA: NONE SEEN
RBC, Urine: NONE SEEN /hpf (ref 0–2)

## 2019-08-15 NOTE — Progress Notes (Signed)
   08/15/19  CC:  Chief Complaint  Patient presents with  . Cysto   Indications: Gross hematuria, see Shannon's note 07/02/2019  HPI: Mr. Lavonia Drafts has no complaints today.  Denies recurrent gross hematuria  Blood pressure (!) 154/77, pulse 62, height 5\' 8"  (1.727 m), weight 221 lb (100.2 kg).   Cystoscopy Procedure Note  Patient identification was confirmed, informed consent was obtained, and patient was prepped using Betadine solution.  Lidocaine jelly was administered per urethral meatus.     Pre-Procedure: - Inspection reveals a normal caliber urethral meatus.  Procedure: The flexible cystoscope was introduced without difficulty - No urethral strictures/lesions are present. - Marked lateral lobe enlargement, hypervascularity, friability and inflammatory change prostate  - Elevated bladder neck - Bilateral ureteral orifices identified - Bladder mucosa  reveals no ulcers, tumors, or lesions - No bladder stones -Trabeculated with cellules and small posterior wall diverticula  Retroflexion shows backbleeding from prostate, no intravesical median lobe   Post-Procedure: - Patient tolerated the procedure well  Assessment/ Plan: -Marked prostate enlargement.  Calculated volume by measurements: RUS 04/2018 >200 g -Hematuria most likely prostatic in etiology -Urine cytology sent -Call for recurrent hematuria, worsening voiding symptoms, otherwise keep follow-up with Wilhemena Durie, MD

## 2019-08-16 ENCOUNTER — Other Ambulatory Visit: Payer: Self-pay | Admitting: Internal Medicine

## 2019-08-16 NOTE — Telephone Encounter (Signed)
Refill request for KLOR-CON , last seen 04-26-19, last filled 02-20-19.  Please advise. Last vitamin D test was done  04-05-17.

## 2019-08-19 ENCOUNTER — Other Ambulatory Visit: Payer: Self-pay | Admitting: Urology

## 2019-08-20 ENCOUNTER — Telehealth: Payer: Self-pay | Admitting: Urology

## 2019-08-20 NOTE — Telephone Encounter (Signed)
Urine cytology showed no abnormal cells 

## 2019-08-21 NOTE — Telephone Encounter (Signed)
Notified patient as instructed, patient pleased. Discussed follow-up appointments, patient agrees  

## 2019-10-02 ENCOUNTER — Other Ambulatory Visit: Payer: Self-pay | Admitting: Internal Medicine

## 2019-10-21 ENCOUNTER — Other Ambulatory Visit: Payer: Self-pay | Admitting: Internal Medicine

## 2019-11-08 DIAGNOSIS — R413 Other amnesia: Secondary | ICD-10-CM | POA: Diagnosis not present

## 2019-11-08 DIAGNOSIS — E519 Thiamine deficiency, unspecified: Secondary | ICD-10-CM | POA: Diagnosis not present

## 2019-11-08 DIAGNOSIS — E538 Deficiency of other specified B group vitamins: Secondary | ICD-10-CM | POA: Diagnosis not present

## 2019-11-13 ENCOUNTER — Other Ambulatory Visit: Payer: Self-pay | Admitting: Internal Medicine

## 2019-11-15 ENCOUNTER — Other Ambulatory Visit: Payer: Self-pay | Admitting: Neurology

## 2019-11-15 DIAGNOSIS — R413 Other amnesia: Secondary | ICD-10-CM

## 2019-12-05 ENCOUNTER — Other Ambulatory Visit: Payer: Self-pay | Admitting: Internal Medicine

## 2019-12-09 ENCOUNTER — Other Ambulatory Visit (HOSPITAL_COMMUNITY): Payer: Medicare Other

## 2019-12-13 ENCOUNTER — Ambulatory Visit (HOSPITAL_COMMUNITY)
Admission: RE | Admit: 2019-12-13 | Discharge: 2019-12-13 | Disposition: A | Discharge status: Home / Self Care | Payer: Medicare Other | Source: Ambulatory Visit | Attending: Neurology | Admitting: Neurology

## 2019-12-13 ENCOUNTER — Other Ambulatory Visit: Payer: Self-pay

## 2019-12-13 DIAGNOSIS — R413 Other amnesia: Secondary | ICD-10-CM | POA: Diagnosis present

## 2020-01-13 ENCOUNTER — Telehealth: Payer: Self-pay | Admitting: Internal Medicine

## 2020-01-13 NOTE — Telephone Encounter (Signed)
Pt called again about his sleep Med' machine. Wanted a call back

## 2020-01-13 NOTE — Telephone Encounter (Signed)
Pt has questions about "Sleep Med" machine. Please call back

## 2020-01-14 NOTE — Telephone Encounter (Signed)
[facility] since they sent him his last CPAP machine and explained to them that pt can not get his machine to work. The respiratory therapist stated that she would give pt a call to help him with the machine.

## 2020-01-21 ENCOUNTER — Other Ambulatory Visit: Payer: Self-pay

## 2020-01-21 ENCOUNTER — Encounter: Payer: Self-pay | Admitting: Urology

## 2020-01-21 ENCOUNTER — Ambulatory Visit (INDEPENDENT_AMBULATORY_CARE_PROVIDER_SITE_OTHER): Payer: Medicare Other | Admitting: Urology

## 2020-01-21 VITALS — BP 153/68 | HR 62 | Ht 68.0 in | Wt 222.0 lb

## 2020-01-21 DIAGNOSIS — N138 Other obstructive and reflux uropathy: Secondary | ICD-10-CM

## 2020-01-21 DIAGNOSIS — R351 Nocturia: Secondary | ICD-10-CM | POA: Diagnosis not present

## 2020-01-21 DIAGNOSIS — N401 Enlarged prostate with lower urinary tract symptoms: Secondary | ICD-10-CM | POA: Diagnosis not present

## 2020-01-21 DIAGNOSIS — R310 Gross hematuria: Secondary | ICD-10-CM | POA: Diagnosis not present

## 2020-01-21 LAB — BLADDER SCAN AMB NON-IMAGING: Scan Result: 112

## 2020-01-21 NOTE — Progress Notes (Addendum)
12:05 PM   Christopher Jenkins September 29, 1926 [id number]  Referring provider: [name], MD [place],  [place] [address]  Chief Complaint  Patient presents with  . Benign Prostatic Hypertrophy    HPI: Patient is an 86 year old male with nocturia, hematuria, a Bosniak 2 cyst and BPH with LU TS who presents today a follow up.    Nocturia Sleeping with CPAP machine.  Nocturia x 1.     History of hematuria (high risk) Non-smoker.  He has underwent CT Urograms in 2017 and 07/2016.  He was found to have a Bosniak 74F on the CT in 07/2015 and it remained unchanged on 07/2016 CTU.  Cystoscopy was negative for malignancies.  Cysto with Dr. Bernardo Heater 07/2019 Trabeculated with cellules and small posterior wall diverticula  Retroflexion shows backbleeding from prostate, no intravesical median lobe.  Cytology was negative.  He has not had any gross hematuria.  His UA was negative for micro heme.  Bosniak 74F cyst MRI 05/2019 The lesion of concern adjacent to the cystic lesion in the lower pole of the left kidney has minimally enlarged compared with most recent study, although shows no definite enhancement on this examination, most consistent with a complex cyst (Bosniak 2). Additional simple renal cysts bilaterally. No enhancing renal masses identified.  BPH WITH LUTS  (prostate and/or bladder) IPSS score: 6/1 PVR: 112 mL    Previous score: 5/1   Previous PVR: 108 mL  Major complaint(s): None.  Patient denies any modifying or aggravating factors.  Patient denies any gross hematuria, dysuria or suprapubic/flank pain.  Patient denies any fevers, chills, nausea or vomiting.     Currently taking: finasteride 5 mg daily and tamsulosin 0.4 mg daily     IPSS    Row Name 01/21/20 1100         International Prostate Symptom Score   How often have you had the sensation of not emptying your bladder? Not at All     How often have you had to urinate less than every two  hours? Less than half the time     How often have you found you stopped and started again several times when you urinated? Not at All     How often have you found it difficult to postpone urination? Not at All     How often have you had a weak urinary stream? Not at All     How often have you had to strain to start urination? Not at All     How many times did you typically get up at night to urinate? 4 Times     Total IPSS Score 6       Quality of Life due to urinary symptoms   If you were to spend the rest of your life with your urinary condition just the way it is now how would you feel about that? Pleased            Score:  1-7 Mild 8-19 Moderate 20-35 Severe  PMH: Past Medical History:  Diagnosis Date  . BPH (benign prostatic hyperplasia)   . Duodenal mass   . Elevated PSA   . Gross hematuria   . Hyperlipidemia   . Hypertension   . Hypertrophy (benign) of prostate    asymptomatic on finasteride. will f/up with urologist [name]  . Kidney lesion 07/2015   pt has appt at kidney specialist 08/05/15 to review CT scan.  . Obesity   .  Overweight   . Tubular adenoma of colon   . Urinary retention     Surgical History: Past Surgical History:  Procedure Laterality Date  . APPENDECTOMY    . cataracts    . right knee surgery Right 12-17/2017  . TOTAL HIP ARTHROPLASTY Right 08/13/2015   Procedure: TOTAL HIP ARTHROPLASTY;  Surgeon: [name], MD;  Location: [facility] ORS;  Service: Orthopedics;  Laterality: Right;  . Uvula Surgery  1990    Home Medications:  Allergies as of 01/21/2020      Reactions   No Known Allergies       Medication List       Accurate as of January 21, 2020 12:05 PM. If you have any questions, ask your nurse or doctor.        aspirin EC 81 MG tablet Take 81 mg by mouth daily.   atorvastatin 40 MG tablet Commonly known as: LIPITOR TAKE 1 TABLET BY MOUTH EVERY DAY   Calcium-Vitamin D 600-125 MG-UNIT Tabs Take 1 tablet by mouth daily at  2 PM. Reported on 09/02/2015   clopidogrel 75 MG tablet Commonly known as: PLAVIX TAKE 1 TABLET (75 MG TOTAL) BY MOUTH DAILY.   Docusate Sodium 150 MG/15ML syrup Commonly known as: Docu PLACE 3 DROPS IN EACH EAR DAILY TO PREVENT OCCLUSION WITH WAX   finasteride 5 MG tablet Commonly known as: PROSCAR Take 1 tablet (5 mg total) by mouth daily.   fish oil-omega-3 fatty acids 1000 MG capsule Take 1 g by mouth daily. Reported on 09/07/2015   furosemide 20 MG tablet Commonly known as: LASIX TAKE 1 TABLET (20 MG TOTAL) BY MOUTH DAILY. IN THE MORNING   Klor-Con M20 20 MEQ tablet Generic drug: potassium chloride SA TAKE 1 TABLET BY MOUTH EVERY DAY   meloxicam 7.5 MG tablet Commonly known as: MOBIC TAKE 1 TABLET (7.5 MG TOTAL) BY MOUTH DAILY.   memantine 10 MG tablet Commonly known as: NAMENDA TAKE 1 TABLET BY MOUTH TWICE A DAY   multivitamin with minerals tablet Take 1 tablet by mouth daily. At 1400   nebivolol 2.5 MG tablet Commonly known as: Bystolic TAKE 1 TABLET BY MOUTH EVERY DAY   niacin 250 MG tablet Take 250 mg by mouth daily. At 1400   sulfamethoxazole-trimethoprim 800-160 MG tablet Commonly known as: BACTRIM DS Take 1 tablet by mouth every 12 (twelve) hours.   tamsulosin 0.4 MG Caps capsule Commonly known as: FLOMAX Take 1 capsule (0.4 mg total) by mouth daily.   traMADol 50 MG tablet Commonly known as: ULTRAM tramadol 50 mg tablet   vitamin C 1000 MG tablet Take 1,000 mg by mouth daily.       Allergies:  Allergies  Allergen Reactions  . No Known Allergies     Family History: Family History  Problem Relation Age of Onset  . Heart disease Mother   . Stroke Mother   . Heart disease Father   . Kidney disease Neg Hx   . Prostate cancer Neg Hx   . Kidney cancer Neg Hx   . Bladder Cancer Neg Hx     Social History:  reports that he has never smoked. He has never used smokeless tobacco. He reports that he does not drink alcohol and does not use  drugs.  ROS: For pertinent review of systems please refer to history of present illness  Physical Exam: BP (!) 153/68   Pulse 62   Ht 5\' 8"  (1.727 m)   Wt 222 lb (100.7 kg)  BMI 33.75 kg/m   Constitutional:  Well nourished. Alert and oriented, No acute distress. HEENT: [place] AT, mask in place.  Trachea midline Cardiovascular: No clubbing, cyanosis, or edema. Respiratory: Normal respiratory effort, no increased work of breathing. Neurologic: Grossly intact, no focal deficits, moving all 4 extremities. Psychiatric: Normal mood and affect.  Laboratory Data: Lab Results  Component Value Date   WBC 8.9 09/01/2017   HGB 14.3 09/01/2017   HCT 41.8 09/01/2017   MCV 94.3 09/01/2017   PLT 179.0 09/01/2017    Lab Results  Component Value Date   CREATININE 0.95 04/12/2019   Urinalysis Component     Latest Ref Rng & Units 01/21/2020  Specific Gravity, UA     1.005 - 1.030 1.020  pH, UA     5.0 - 7.5 6.0  Color, UA     Yellow Yellow  Appearance Ur     Clear Cloudy (A)  Leukocytes,UA     Negative Trace (A)  Protein,UA     Negative/Trace Negative  Glucose, UA     Negative Negative  Ketones, UA     Negative Negative  RBC, UA     Negative Trace (A)  Bilirubin, UA     Negative Negative  Urobilinogen, Ur     0.2 - 1.0 mg/dL 1.0  Nitrite, UA     Negative Positive (A)  Microscopic Examination      See below:   Component     Latest Ref Rng & Units 01/21/2020  WBC, UA     0 - 5 /hpf 11-30 (A)  RBC     0 - 2 /hpf 0-2  Epithelial Cells (non renal)     0 - 10 /hpf 0-10  Crystals     N/A Present (A)  Crystal Type     N/A Amorphous Sediment  Bacteria, UA     None seen/Few Many (A)   I have reviewed the labs.   Pertinent Imaging Results for orders placed or performed in visit on 01/21/20  Bladder Scan (Post Void Residual) in office  Result Value Ref Range   Scan Result 112    Assessment & Plan:    1. Nocturia Patient states that he is still sleeping with a CPAP  machine  2. Gross hematuria Hematuria work up demonstrated an enlarged prostate No reports of gross hematuria No micro heme on today's UA  3. Left Bosniak 2 cyst MRI demonstrates a Bosniak 2 cyst - no further follow up is warranted   4. BPH with LUTS IPSS score is 6/1, it is worsening Continue conservative management, avoiding bladder irritants and timed voiding's Most bothersome symptoms is/are gross hematuria Continue tamsulosin 0.4 mg daily and finasteride 5 mg daily- refills given   Return in about 6 months (around 07/23/2020) for IPSS, PVR and exam.  Zara Council, [facility]  [place] [address] [place], [place] [address] [phone]

## 2020-01-23 LAB — URINALYSIS, COMPLETE
Bilirubin, UA: NEGATIVE
Glucose, UA: NEGATIVE
Ketones, UA: NEGATIVE
Nitrite, UA: POSITIVE — AB
Protein,UA: NEGATIVE
Specific Gravity, UA: 1.02 (ref 1.005–1.030)
Urobilinogen, Ur: 1 mg/dL (ref 0.2–1.0)
pH, UA: 6 (ref 5.0–7.5)

## 2020-01-23 LAB — MICROSCOPIC EXAMINATION

## 2020-01-31 ENCOUNTER — Other Ambulatory Visit: Payer: Self-pay | Admitting: Internal Medicine

## 2020-01-31 ENCOUNTER — Telehealth: Payer: Self-pay | Admitting: Internal Medicine

## 2020-01-31 DIAGNOSIS — N401 Enlarged prostate with lower urinary tract symptoms: Secondary | ICD-10-CM

## 2020-01-31 DIAGNOSIS — N138 Other obstructive and reflux uropathy: Secondary | ICD-10-CM

## 2020-01-31 MED ORDER — DOCUSATE SODIUM 150 MG/15ML PO LIQD: ORAL | 0 refills | Status: DC

## 2020-01-31 MED ORDER — TAMSULOSIN HCL 0.4 MG PO CAPS: 0.4000 mg | ORAL_CAPSULE | Freq: Every day | ORAL | 3 refills | Status: DC

## 2020-01-31 MED ORDER — FINASTERIDE 5 MG PO TABS: 5.0000 mg | ORAL_TABLET | Freq: Every day | ORAL | 3 refills | Status: DC

## 2020-01-31 MED ORDER — ATORVASTATIN CALCIUM 40 MG PO TABS: 40.0000 mg | ORAL_TABLET | Freq: Every day | ORAL | 0 refills | Status: DC

## 2020-01-31 MED ORDER — MELOXICAM 7.5 MG PO TABS: ORAL_TABLET | ORAL | 5 refills | Status: DC

## 2020-01-31 MED ORDER — MEMANTINE HCL 10 MG PO TABS: 10.0000 mg | ORAL_TABLET | Freq: Two times a day (BID) | ORAL | 1 refills | Status: DC

## 2020-01-31 MED ORDER — FUROSEMIDE 20 MG PO TABS: 20.0000 mg | ORAL_TABLET | Freq: Every day | ORAL | 1 refills | Status: DC

## 2020-01-31 MED ORDER — NEBIVOLOL HCL 2.5 MG PO TABS: ORAL_TABLET | ORAL | 1 refills | Status: DC

## 2020-01-31 MED ORDER — CLOPIDOGREL BISULFATE 75 MG PO TABS: 75.0000 mg | ORAL_TABLET | Freq: Every day | ORAL | 3 refills | Status: DC

## 2020-01-31 NOTE — Telephone Encounter (Signed)
Pt needs all medication refilled and sent to [facility]. Pt used to use [facility].

## 2020-02-12 ENCOUNTER — Other Ambulatory Visit: Payer: Self-pay | Admitting: Internal Medicine

## 2020-02-15 ENCOUNTER — Other Ambulatory Visit: Payer: Self-pay | Admitting: Internal Medicine

## 2020-03-09 DIAGNOSIS — D3132 Benign neoplasm of left choroid: Secondary | ICD-10-CM | POA: Diagnosis not present

## 2020-03-13 ENCOUNTER — Other Ambulatory Visit: Payer: Self-pay | Admitting: Internal Medicine

## 2020-03-23 ENCOUNTER — Telehealth: Payer: Self-pay | Admitting: Internal Medicine

## 2020-03-23 NOTE — Telephone Encounter (Signed)
Faxed

## 2020-03-23 NOTE — Telephone Encounter (Signed)
Debbie from [facility] [phone] called. She is looking for th FL2 paper work for patient and his wife.

## 2020-04-03 ENCOUNTER — Ambulatory Visit: Payer: Medicare Other | Admitting: Internal Medicine

## 2020-04-09 ENCOUNTER — Telehealth: Payer: Self-pay

## 2020-04-09 ENCOUNTER — Other Ambulatory Visit: Payer: Self-pay | Admitting: Internal Medicine

## 2020-04-09 NOTE — Telephone Encounter (Signed)
Christopher Jenkins with [facility] is requesting copies of DNR . Please call her back at your earliest convenience @ [phone] or [phone] Christopher Jenkins)

## 2020-04-10 ENCOUNTER — Ambulatory Visit: Payer: Medicare Other | Admitting: Internal Medicine

## 2020-04-10 NOTE — Telephone Encounter (Signed)
Spoke with [facility] to let them know that pt's DNR is not valid due to having some crossed out lines and that they will need a new form filled at. Pt has an appt next week and we will get this done then.

## 2020-04-10 NOTE — Telephone Encounter (Signed)
Debbie with Elease Hashimoto at [facility] returned your call.

## 2020-04-17 ENCOUNTER — Encounter: Payer: Self-pay | Admitting: Internal Medicine

## 2020-04-17 ENCOUNTER — Ambulatory Visit (INDEPENDENT_AMBULATORY_CARE_PROVIDER_SITE_OTHER): Payer: Medicare Other | Admitting: Internal Medicine

## 2020-04-17 ENCOUNTER — Other Ambulatory Visit: Payer: Self-pay

## 2020-04-17 VITALS — BP 116/62 | HR 60 | Temp 98.4°F | Ht 68.0 in | Wt 230.2 lb

## 2020-04-17 DIAGNOSIS — Z23 Encounter for immunization: Secondary | ICD-10-CM | POA: Diagnosis not present

## 2020-04-17 DIAGNOSIS — I10 Essential (primary) hypertension: Secondary | ICD-10-CM | POA: Diagnosis not present

## 2020-04-17 DIAGNOSIS — E7800 Pure hypercholesterolemia, unspecified: Secondary | ICD-10-CM

## 2020-04-17 DIAGNOSIS — K5909 Other constipation: Secondary | ICD-10-CM

## 2020-04-17 DIAGNOSIS — F0280 Dementia in other diseases classified elsewhere without behavioral disturbance: Secondary | ICD-10-CM | POA: Diagnosis not present

## 2020-04-17 DIAGNOSIS — G309 Alzheimer's disease, unspecified: Secondary | ICD-10-CM

## 2020-04-17 MED ORDER — DOCUSATE SODIUM 100 MG PO CAPS: 200.0000 mg | ORAL_CAPSULE | Freq: Every day | ORAL | 11 refills | Status: AC

## 2020-04-17 NOTE — Progress Notes (Signed)
Subjective:  Patient ID: Christopher Jenkins, male    DOB: [birth year]  Age: 86 y.o. MRN: [id number]  CC: The primary encounter diagnosis was Pure hypercholesterolemia. Diagnoses of Primary hypertension, Other constipation, Alzheimer's dementia without behavioral disturbance, unspecified timing of dementia onset ([place]), and Need for immunization against influenza were also pertinent to this visit.  HPI Christopher Jenkins presents for one year follow up on hypertension, hyperlipidemia and Alzheimers dementia.  Last seen Nov 2020  This visit occurred during the SARS-CoV-2 public health emergency.  Safety protocols were in place, including screening questions prior to the visit, additional usage of staff PPE, and extensive cleaning of exam room while observing appropriate contact time as indicated for disinfecting solutions.   COVID vaccination status unclear.  Likely positive since he and his wife have transitioned from independent living to A/L at [facility] 3 days ago.    No complaints.  Feels very fortunate to have the financial support to live there.  Enjoying the improved meals  and less responsibility.  Reviewed the orders from [facility] requesting a solution to the problem of large BM's causing plumbing problems.    Outpatient Medications Prior to Visit  Medication Sig Dispense Refill  . Ascorbic Acid (VITAMIN C) 1000 MG tablet Take 1,000 mg by mouth daily.    Marland Kitchen aspirin EC 81 MG tablet Take 81 mg by mouth daily.    Marland Kitchen atorvastatin (LIPITOR) 40 MG tablet TAKE 1 TABLET BY MOUTH EVERY DAY 90 tablet 0  . Calcium Carbonate-Vitamin D (CALCIUM-VITAMIN D) 600-125 MG-UNIT TABS Take 1 tablet by mouth daily at 2 PM. Reported on 09/02/2015    . clopidogrel (PLAVIX) 75 MG tablet Take 1 tablet (75 mg total) by mouth daily. 90 tablet 3  . Docusate Sodium (DOCU) 150 MG/15ML syrup PLACE 3 DROPS IN EACH EAR DAILY TO PREVENT OCCLUSION WITH WAX 100 mL 0  . finasteride (PROSCAR) 5 MG tablet Take 1 tablet (5 mg total)  by mouth daily. 90 tablet 3  . fish oil-omega-3 fatty acids 1000 MG capsule Take 1 g by mouth daily. Reported on 09/07/2015    . furosemide (LASIX) 20 MG tablet Take 1 tablet (20 mg total) by mouth daily. In the morning 90 tablet 1  . meloxicam (MOBIC) 7.5 MG tablet TAKE 1 TABLET (7.5 MG TOTAL) BY MOUTH DAILY. 30 tablet 5  . memantine (NAMENDA) 10 MG tablet Take 1 tablet (10 mg total) by mouth 2 (two) times daily. 180 tablet 1  . Multiple Vitamins-Minerals (MULTIVITAMIN WITH MINERALS) tablet Take 1 tablet by mouth daily. At 1400    . nebivolol (BYSTOLIC) 2.5 MG tablet TAKE 1 TABLET BY MOUTH EVERY DAY 90 tablet 1  . niacin 250 MG tablet Take 250 mg by mouth daily. At 1400    . potassium chloride SA (KLOR-CON) 20 MEQ tablet TAKE ONE TABLET BY MOUTH EVERY DAY 30 tablet 0  . tamsulosin (FLOMAX) 0.4 MG CAPS capsule Take 1 capsule (0.4 mg total) by mouth daily. 90 capsule 3  . donepezil (ARICEPT) 5 MG tablet Take 5 mg by mouth at bedtime. (Patient not taking: Reported on 04/17/2020)    . sulfamethoxazole-trimethoprim (BACTRIM DS) 800-160 MG tablet Take 1 tablet by mouth every 12 (twelve) hours. (Patient not taking: Reported on 04/17/2020) 14 tablet 0  . traMADol (ULTRAM) 50 MG tablet tramadol 50 mg tablet (Patient not taking: Reported on 04/17/2020)     No facility-administered medications prior to visit.    Review of Systems;  Patient  denies headache, fevers, malaise, unintentional weight loss, skin rash, eye pain, sinus congestion and sinus pain, sore throat, dysphagia,  hemoptysis , cough, dyspnea, wheezing, chest pain, palpitations, orthopnea, edema, abdominal pain, nausea, melena, diarrhea, constipation, flank pain, dysuria, hematuria, urinary  Frequency, nocturia, numbness, tingling, seizures,  Focal weakness, Loss of consciousness,  Tremor, insomnia, depression, anxiety, and suicidal ideation.      Objective:  BP 116/62   Pulse 60   Temp 98.4 F (36.9 C)   Ht 5\' 8"  (1.727 m)   Wt 230 lb  3.2 oz (104.4 kg)   SpO2 95%   BMI 35.00 kg/m   BP Readings from Last 3 Encounters:  04/17/20 116/62  01/21/20 (!) 153/68  08/14/19 (!) 154/77    Wt Readings from Last 3 Encounters:  04/17/20 230 lb 3.2 oz (104.4 kg)  01/21/20 222 lb (100.7 kg)  08/14/19 221 lb (100.2 kg)    General appearance: alert, cooperative and appears stated age Ears: normal TM's and external ear canals both ears Throat: lips, mucosa, and tongue normal; teeth and gums normal Neck: no adenopathy, no carotid bruit, supple, symmetrical, trachea midline and thyroid not enlarged, symmetric, no tenderness/mass/nodules Back: symmetric, no curvature. ROM normal. No CVA tenderness. Lungs: clear to auscultation bilaterally Heart: regular rate and rhythm, S1, S2 normal, no murmur, click, rub or gallop Abdomen: soft, non-tender; bowel sounds normal; no masses,  no organomegaly Pulses: 2+ and symmetric Skin: Skin color, texture, turgor normal. No rashes or lesions Lymph nodes: Cervical, supraclavicular, and axillary nodes normal.  Lab Results  Component Value Date   HGBA1C 5.5 04/12/2019   HGBA1C 5.6 10/06/2016    Lab Results  Component Value Date   CREATININE 0.94 04/17/2020   CREATININE 0.95 04/12/2019   CREATININE 1.03 04/10/2018    Lab Results  Component Value Date   WBC 8.9 09/01/2017   HGB 14.3 09/01/2017   HCT 41.8 09/01/2017   PLT 179.0 09/01/2017   GLUCOSE 146 (H) 04/17/2020   CHOL 109 04/17/2020   TRIG 160 (H) 04/17/2020   HDL 38 (L) 04/17/2020   LDLDIRECT 109.0 04/12/2019   LDLCALC 47 04/17/2020   ALT 14 04/17/2020   AST 18 04/17/2020   NA 141 04/17/2020   K 4.4 04/17/2020   CL 103 04/17/2020   CREATININE 0.94 04/17/2020   BUN 23 04/17/2020   CO2 29 04/17/2020   TSH 1.98 04/05/2017   INR 1.1 (H) 04/14/2016   HGBA1C 5.5 04/12/2019   MICROALBUR 2.2 (H) 10/06/2016    MR BRAIN WO CONTRAST  Result Date: 12/14/2019 CLINICAL DATA:  Memory issues EXAM: MRI HEAD WITHOUT CONTRAST  TECHNIQUE: Multiplanar, multiecho pulse sequences of the brain and surrounding structures were obtained without intravenous contrast. Additionally, using NeuroQuant software a 3D volumetric analysis of the brain was performed and is compared to a normative database adjusted for age, gender and intracranial volume. COMPARISON:  2016 FINDINGS: Brain: There is no acute infarction or intracranial hemorrhage. Prominence of the ventricles and sulci reflects parenchymal volume loss similar to the prior study. Patchy T2 hyperintensity in the supratentorial white matter is nonspecific but likely reflects similar mild chronic microvascular ischemic changes. Small foci of susceptibility in the left temporal subcortical white matter and left cerebellum are compatible with chronic microhemorrhages or less likely mineralization. There is no intracranial mass, mass effect, edema, hydrocephalus, or extra-axial collection. Vascular: Major vessel flow voids at the skull base are preserved. Skull and upper cervical spine: Normal marrow signal. Sinuses/Orbits: Trace paranasal sinus mucosal thickening.  Bilateral lens replacements. Other: Sella is unremarkable.  Mastoid air cells are clear. NeuroQuant Findings: Volumetric analysis of the brain was performed, with a fully detailed report in [facility]. Briefly, the comparison with age and gender matched reference reveals whole brain at the eleventh percentile and hippocampi at the fourth percentile. IMPRESSION: No acute abnormality. Similar mild chronic microvascular ischemic changes. Similar qualitative volume loss. NeuroQuant volumetric analysis of the brain, see details on PACS. Electronically Signed   By: Macy Mis M.D.   On: 12/14/2019 21:04    Assessment & Plan:   Problem List Items Addressed This Visit      Unprioritized   Hyperlipidemia - Primary   Relevant Orders   Lipid panel (Completed)   Hypertension    Well controlled on current regimen of nebivolol 2.5 mg  daily . Renal function is due,  no changes today.      Relevant Orders   Comprehensive metabolic panel (Completed)   Alzheimer's dementia without behavioral disturbance ([place])    He has been seen by Neurolgy and Memantine started .  He and his wife have recently transitioned from independent living to A/L at [facility].  DNR signed and sent with patient       Relevant Orders   DNR (Do Not Resuscitate)   Constipation    Adding Colace 200 mg qhs        Other Visit Diagnoses    Need for immunization against influenza       Relevant Orders   Flu Vaccine QUAD High Dose(Fluad) (Completed)      I have discontinued Harun H. Devincentis's traMADol, sulfamethoxazole-trimethoprim, and donepezil. I am also having him start on docusate sodium. Additionally, I am having him maintain his niacin, fish oil-omega-3 fatty acids, multivitamin with minerals, Calcium-Vitamin D, vitamin C, aspirin EC, tamsulosin, nebivolol, memantine, meloxicam, furosemide, finasteride, Docusate Sodium, clopidogrel, atorvastatin, and potassium chloride SA.  Meds ordered this encounter  Medications  . docusate sodium (COLACE) 100 MG capsule    Sig: Take 2 capsules (200 mg total) by mouth at bedtime.    Dispense:  60 capsule    Refill:  11    Medications Discontinued During This Encounter  Medication Reason  . sulfamethoxazole-trimethoprim (BACTRIM DS) 800-160 MG tablet Completed Course  . traMADol (ULTRAM) 50 MG tablet Patient has not taken in last 30 days  . donepezil (ARICEPT) 5 MG tablet Discontinued by provider    Follow-up: No follow-ups on file.   [name], MD

## 2020-04-17 NOTE — Assessment & Plan Note (Signed)
Well controlled on current regimen of nebivolol 2.5 mg daily . Renal function is due,  no changes today.

## 2020-04-17 NOTE — Assessment & Plan Note (Signed)
Adding Colace 200 mg qhs

## 2020-04-17 NOTE — Assessment & Plan Note (Addendum)
He has been seen by Neurolgy and Memantine started .  He and his wife have recently transitioned from independent living to A/L at [facility].  DNR signed and sent with patient

## 2020-04-18 LAB — LIPID PANEL
Cholesterol: 109 mg/dL (ref <200)
HDL: 38 mg/dL — ABNORMAL LOW (ref >=40)
LDL Cholesterol (Calc): 47 mg/dL (calc)
Non-HDL Cholesterol (Calc): 71 mg/dL (calc) (ref <130)
Total CHOL/HDL Ratio: 2.9 (calc) (ref <5.0)
Triglycerides: 160 mg/dL — ABNORMAL HIGH (ref <150)

## 2020-04-18 LAB — COMPREHENSIVE METABOLIC PANEL
AG Ratio: 1.8 (calc) (ref 1.0–2.5)
ALT: 14 U/L (ref 9–46)
AST: 18 U/L (ref 10–35)
Albumin: 3.5 g/dL — ABNORMAL LOW (ref 3.6–5.1)
Alkaline phosphatase (APISO): 94 U/L (ref 35–144)
BUN: 23 mg/dL (ref 7–25)
CO2: 29 mmol/L (ref 20–32)
Calcium: 9.1 mg/dL (ref 8.6–10.3)
Chloride: 103 mmol/L (ref 98–110)
Creat: 0.94 mg/dL (ref 0.70–1.11)
Globulin: 2 g/dL (calc) (ref 1.9–3.7)
Glucose, Bld: 146 mg/dL — ABNORMAL HIGH (ref 65–99)
Potassium: 4.4 mmol/L (ref 3.5–5.3)
Sodium: 141 mmol/L (ref 135–146)
Total Bilirubin: 0.4 mg/dL (ref 0.2–1.2)
Total Protein: 5.5 g/dL — ABNORMAL LOW (ref 6.1–8.1)

## 2020-04-20 ENCOUNTER — Encounter: Payer: Self-pay | Admitting: *Deleted

## 2020-05-01 DIAGNOSIS — Z23 Encounter for immunization: Secondary | ICD-10-CM | POA: Diagnosis not present

## 2020-05-07 ENCOUNTER — Other Ambulatory Visit: Payer: Self-pay | Admitting: Internal Medicine

## 2020-05-12 DIAGNOSIS — R413 Other amnesia: Secondary | ICD-10-CM | POA: Diagnosis not present

## 2020-05-27 ENCOUNTER — Telehealth: Payer: Self-pay | Admitting: Internal Medicine

## 2020-05-27 DIAGNOSIS — Z0279 Encounter for issue of other medical certificate: Secondary | ICD-10-CM

## 2020-05-27 NOTE — Telephone Encounter (Signed)
Form has been faxed.

## 2020-05-27 NOTE — Telephone Encounter (Signed)
The form for his long term care insurance requiring verification of dementia diagnosis has been completed and will be faxed to the insurance company  Please charge the patient $50 for for completion,  As it took 20 minutes of chart review  to complete

## 2020-06-04 ENCOUNTER — Other Ambulatory Visit: Payer: Self-pay | Admitting: Internal Medicine

## 2020-07-10 ENCOUNTER — Other Ambulatory Visit: Payer: Self-pay | Admitting: Internal Medicine

## 2020-08-06 ENCOUNTER — Ambulatory Visit: Payer: Medicare Other | Admitting: Internal Medicine

## 2020-08-06 ENCOUNTER — Encounter: Payer: Self-pay | Admitting: Internal Medicine

## 2020-08-06 ENCOUNTER — Other Ambulatory Visit: Payer: Self-pay

## 2020-08-06 VITALS — BP 136/74 | HR 50 | Temp 99.8°F | Resp 18 | Wt 236.0 lb

## 2020-08-06 DIAGNOSIS — I700 Atherosclerosis of aorta: Secondary | ICD-10-CM | POA: Diagnosis not present

## 2020-08-06 DIAGNOSIS — G4733 Obstructive sleep apnea (adult) (pediatric): Secondary | ICD-10-CM | POA: Diagnosis not present

## 2020-08-06 DIAGNOSIS — N401 Enlarged prostate with lower urinary tract symptoms: Secondary | ICD-10-CM | POA: Diagnosis not present

## 2020-08-06 DIAGNOSIS — G301 Alzheimer's disease with late onset: Secondary | ICD-10-CM | POA: Diagnosis not present

## 2020-08-06 DIAGNOSIS — F0280 Dementia in other diseases classified elsewhere without behavioral disturbance: Secondary | ICD-10-CM

## 2020-08-06 DIAGNOSIS — N138 Other obstructive and reflux uropathy: Secondary | ICD-10-CM

## 2020-08-06 DIAGNOSIS — I10 Essential (primary) hypertension: Secondary | ICD-10-CM | POA: Diagnosis not present

## 2020-08-06 NOTE — Assessment & Plan Note (Signed)
Now in assisted living for mild care needs On donepezil and memantine

## 2020-08-06 NOTE — Progress Notes (Signed)
Subjective:    Patient ID: Christopher Jenkins, male    DOB: Apr 12, 1927, 87 y.o.   MRN: [id number]  HPI Visit in assisted living apartment to establish care (switching from my partner) Reviewed status with Luellen Pucker RN Wife is here  Moved recently for increased supervision--dementia in both he and wife They are both satisfied [place] to [place] and [place] classes Gave up driving but still goes out to church (sings in choir). Friend helps him with transportation  Recent scam attempt by phone--staff intercepted this somewhat (he gave out bank account information) Discussed financial safety Needs assistance with bathing and meds Staff does laundry also Had episode of coming out of room with pants soiled with stool (this morning)  No chest pain No SOB No dizziness or syncope No edema No leg pain  Voids okay Flow is okay Nocturia x 1-2  No joint pains No back pain He is not sure why he is taking the meloxicam  Reviewed past CT scan Has know aortic atherosclerosis  BMI a little over 35 now  Current Outpatient Medications on File Prior to Visit  Medication Sig Dispense Refill  . Ascorbic Acid (VITAMIN C) 1000 MG tablet Take 1,000 mg by mouth daily.    Marland Kitchen aspirin EC 81 MG tablet Take 81 mg by mouth daily.    Marland Kitchen atorvastatin (LIPITOR) 40 MG tablet TAKE 1 TABLET BY MOUTH DAILY. 90 tablet 0  . BYSTOLIC 2.5 MG tablet TAKE 1 TABLET BY MOUTH EVERY DAY. 90 tablet 1  . Calcium Carbonate-Vitamin D (CALCIUM-VITAMIN D) 600-125 MG-UNIT TABS Take 1 tablet by mouth daily at 2 PM. Reported on 09/02/2015    . clopidogrel (PLAVIX) 75 MG tablet Take 1 tablet (75 mg total) by mouth daily. 90 tablet 3  . docusate sodium (COLACE) 100 MG capsule Take 2 capsules (200 mg total) by mouth at bedtime. 60 capsule 11  . Docusate Sodium (DOCU) 150 MG/15ML syrup PLACE 3 DROPS IN EACH EAR DAILY TO PREVENT OCCLUSION WITH WAX 100 mL 0  . donepezil (ARICEPT) 5 MG tablet Take 5 mg by mouth at  bedtime.    . finasteride (PROSCAR) 5 MG tablet Take 1 tablet (5 mg total) by mouth daily. 90 tablet 3  . fish oil-omega-3 fatty acids 1000 MG capsule Take 1 g by mouth daily. Reported on 09/07/2015    . furosemide (LASIX) 20 MG tablet TAKE 1 TABLET BY MOUTH DAILY IN THE MORNING. 90 tablet 1  . meloxicam (MOBIC) 7.5 MG tablet TAKE 1 TABLET BY MOUTH DAILY. 30 tablet 5  . memantine (NAMENDA) 10 MG tablet Take 1 tablet (10 mg total) by mouth 2 (two) times daily. 180 tablet 1  . Multiple Vitamins-Minerals (MULTIVITAMIN WITH MINERALS) tablet Take 1 tablet by mouth daily. At 1400    . niacin 250 MG tablet Take 250 mg by mouth daily. At 1400    . potassium chloride SA (KLOR-CON) 20 MEQ tablet TAKE ONE TABLET BY MOUTH EVERY DAY 30 tablet 0  . tamsulosin (FLOMAX) 0.4 MG CAPS capsule Take 1 capsule (0.4 mg total) by mouth daily. 90 capsule 3   No current facility-administered medications on file prior to visit.    Allergies  Allergen Reactions  . No Known Allergies     Past Medical History:  Diagnosis Date  . BPH (benign prostatic hyperplasia)   . Duodenal mass   . Elevated PSA   . Gross hematuria   . Hyperlipidemia   . Hypertension   .  Hypertrophy (benign) of prostate    asymptomatic on finasteride. will f/up with urologist [name]  . Kidney lesion 07/2015   pt has appt at kidney specialist 08/05/15 to review CT scan.  . Obesity   . Overweight   . Tubular adenoma of colon   . Urinary retention     Past Surgical History:  Procedure Laterality Date  . APPENDECTOMY    . cataracts    . right knee surgery Right 12-17/2017  . TOTAL HIP ARTHROPLASTY Right 08/13/2015   Procedure: TOTAL HIP ARTHROPLASTY;  Surgeon: [name], MD;  Location: [facility] ORS;  Service: Orthopedics;  Laterality: Right;  . Uvula Surgery  1990    Family History  Problem Relation Age of Onset  . Heart disease Mother   . Stroke Mother   . Heart disease Father   . Kidney disease Neg Hx   . Prostate cancer  Neg Hx   . Kidney cancer Neg Hx   . Bladder Cancer Neg Hx     Social History   Socioeconomic History  . Marital status: Married    Spouse name: Not on file  . Number of children: 3  . Years of education: Not on file  . Highest education level: Not on file  Occupational History  . Occupation: Retired  Tobacco Use  . Smoking status: Never Smoker  . Smokeless tobacco: Never Used  Substance and Sexual Activity  . Alcohol use: No    Comment: Occasional glass of wine 3-4 times a year.  . Drug use: No  . Sexual activity: Never  Other Topics Concern  . Not on file  Social History Narrative   2 sons, 1 daughter      Living will?   Sons/daughter would be medical decision makers   Has DNR   No tube feeds if cognitively unaware   Social Determinants of Health   Financial Resource Strain: Not on file  Food Insecurity: Not on file  Transportation Needs: Not on file  Physical Activity: Not on file  Stress: Not on file  Social Connections: Not on file  Intimate Partner Violence: Not on file   Review of Systems Appetite is good Food is "superb" Has gained some weight Sleeps well Bowels are okay    Objective:   Physical Exam Constitutional:      Appearance: Normal appearance.  Cardiovascular:     Rate and Rhythm: Normal rate and regular rhythm.     Heart sounds: No murmur heard. No gallop.   Pulmonary:     Effort: Pulmonary effort is normal.     Breath sounds: Normal breath sounds. No wheezing or rales.  Abdominal:     Palpations: Abdomen is soft.     Tenderness: There is no abdominal tenderness.  Musculoskeletal:     Comments: Thick ankles without pitting No active synovitis  Neurological:     Mental Status: He is alert.     Comments: Knows month and year President--"Joe Biden, Trump" Trouble remembering how long he has been at [facility], etc  Psychiatric:        Mood and Affect: Mood normal.        Behavior: Behavior normal.            Assessment &  Plan:

## 2020-08-06 NOTE — Assessment & Plan Note (Signed)
Seen on CT scan On statin Doesn't need plavix and ASA---will stop the plavix

## 2020-08-06 NOTE — Assessment & Plan Note (Signed)
BMI over 35 with HTN, liver disease, OSA, etc I asked him to be careful with eating (now that meals are provided and he gained weight)

## 2020-08-06 NOTE — Assessment & Plan Note (Signed)
BP Readings from Last 3 Encounters:  08/06/20 136/74  04/17/20 116/62  01/21/20 (!) 153/68   Good control on bystolic Also on lasix--can consider changing to prn if legs better (off meloxicam)

## 2020-08-06 NOTE — Assessment & Plan Note (Signed)
Uses CPAP nightly and this really helps

## 2020-08-06 NOTE — Assessment & Plan Note (Signed)
Voids well on the tamsulosin and finasteride

## 2020-08-11 ENCOUNTER — Other Ambulatory Visit: Payer: Self-pay | Admitting: Internal Medicine

## 2020-09-04 ENCOUNTER — Other Ambulatory Visit: Payer: Self-pay | Admitting: Internal Medicine

## 2020-09-11 ENCOUNTER — Other Ambulatory Visit: Payer: Self-pay | Admitting: Internal Medicine

## 2020-09-14 ENCOUNTER — Other Ambulatory Visit: Payer: Self-pay | Admitting: Internal Medicine

## 2020-10-05 ENCOUNTER — Other Ambulatory Visit: Payer: Self-pay | Admitting: Internal Medicine

## 2020-10-06 ENCOUNTER — Ambulatory Visit: Payer: Medicare Other | Admitting: Internal Medicine

## 2020-10-14 ENCOUNTER — Other Ambulatory Visit: Payer: Self-pay | Admitting: Internal Medicine

## 2020-10-23 ENCOUNTER — Non-Acute Institutional Stay: Payer: Medicare Other | Admitting: Internal Medicine

## 2020-10-23 DIAGNOSIS — G4733 Obstructive sleep apnea (adult) (pediatric): Secondary | ICD-10-CM

## 2020-10-23 DIAGNOSIS — I10 Essential (primary) hypertension: Secondary | ICD-10-CM

## 2020-10-23 DIAGNOSIS — N138 Other obstructive and reflux uropathy: Secondary | ICD-10-CM | POA: Diagnosis not present

## 2020-10-23 DIAGNOSIS — F0280 Dementia in other diseases classified elsewhere without behavioral disturbance: Secondary | ICD-10-CM | POA: Diagnosis not present

## 2020-10-23 DIAGNOSIS — G301 Alzheimer's disease with late onset: Secondary | ICD-10-CM

## 2020-10-23 DIAGNOSIS — E7800 Pure hypercholesterolemia, unspecified: Secondary | ICD-10-CM

## 2020-10-23 DIAGNOSIS — I700 Atherosclerosis of aorta: Secondary | ICD-10-CM | POA: Diagnosis not present

## 2020-10-23 DIAGNOSIS — N401 Enlarged prostate with lower urinary tract symptoms: Secondary | ICD-10-CM | POA: Diagnosis not present

## 2020-10-26 ENCOUNTER — Encounter: Payer: Self-pay | Admitting: Internal Medicine

## 2020-10-26 NOTE — Assessment & Plan Note (Signed)
Continue Aspirin and Atorvastatin Appreciate ALF care

## 2020-10-26 NOTE — Assessment & Plan Note (Signed)
Monitoring weights

## 2020-10-26 NOTE — Assessment & Plan Note (Signed)
Continue Atorvastatin Encouraged him to consume a low fat diet 

## 2020-10-26 NOTE — Assessment & Plan Note (Signed)
Continue Flomax and Finasteride Will monitor 

## 2020-10-26 NOTE — Assessment & Plan Note (Signed)
Continue Aspirin and Atorvastatin Encouraged him to consume a low-fat diet

## 2020-10-26 NOTE — Assessment & Plan Note (Signed)
Controlled on Bystolic, Furosemide and Potassium Will monitor

## 2020-10-26 NOTE — Progress Notes (Signed)
Subjective:    Patient ID: Christopher Jenkins, male    DOB: [birth year], 87 y.o.   MRN: [id number]  HPI  Resident seen in a PT 201 No new concerns from staff or resident. He reports he sleeps well but not enough.  He is independent with his ADLs.  He walks without an assistive device.  His appetite is good, weight is stable.  He voids okay with Flomax and finasteride.  His bowels are moving normally.  He denies reflux, chest pain or shortness of breath. He feels like his mood is okay.  Alzheimer's: Mild cognitive but stable functional needs.  He is taking Aspirin, Atorvastatin, Aricept and Namenda as prescribed.  BPH: He denies issues on Flomax and finasteride.  HLD with Aortic Atherosclerosis: He denies myalgias on Atorvastatin.  He tries to consume a low-fat diet.  HTN: His BP today is 134/70.  Managed on Bystolic, Furosemide and Potassium.  ECG from 03/2015 reviewed.  OSA: He is wearing a CPAP at night.  He does not always feel rested when he wakes up.  Sleep study from 02/2013 reviewed.  Review of Systems      Past Medical History:  Diagnosis Date  . BPH (benign prostatic hyperplasia)   . Duodenal mass   . Elevated PSA   . Gross hematuria   . Hyperlipidemia   . Hypertension   . Hypertrophy (benign) of prostate    asymptomatic on finasteride. will f/up with urologist [name]  . Kidney lesion 07/2015   pt has appt at kidney specialist 08/05/15 to review CT scan.  . Obesity   . Overweight   . Tubular adenoma of colon   . Urinary retention     Current Outpatient Medications  Medication Sig Dispense Refill  . aspirin EC 81 MG tablet Take 81 mg by mouth daily.    Marland Kitchen atorvastatin (LIPITOR) 40 MG tablet TAKE 1 TABLET BY MOUTH DAILY. 90 tablet 0  . BYSTOLIC 2.5 MG tablet TAKE 1 TABLET BY MOUTH EVERY DAY. 90 tablet 1  . docusate sodium (COLACE) 100 MG capsule Take 2 capsules (200 mg total) by mouth at bedtime. 60 capsule 11  . Docusate Sodium (DOCU) 150 MG/15ML syrup PLACE  3 DROPS IN EACH EAR DAILY TO PREVENT OCCLUSION WITH WAX 100 mL 0  . donepezil (ARICEPT) 5 MG tablet Take 5 mg by mouth at bedtime.    . finasteride (PROSCAR) 5 MG tablet Take 1 tablet (5 mg total) by mouth daily. 90 tablet 3  . furosemide (LASIX) 20 MG tablet TAKE 1 TABLET BY MOUTH DAILY IN THE MORNING. 90 tablet 1  . memantine (NAMENDA) 10 MG tablet Take 1 tablet (10 mg total) by mouth 2 (two) times daily. 180 tablet 1  . MUCINEX 600 MG 12 hr tablet TAKE ONE TABLET BY MOUTH TWICE DAILY 60 tablet 0  . Multiple Vitamins-Minerals (MULTIVITAMIN WITH MINERALS) tablet Take 1 tablet by mouth daily. At 1400    . potassium chloride SA (KLOR-CON) 20 MEQ tablet TAKE ONE TABLET BY MOUTH EVERY DAY 30 tablet 0  . tamsulosin (FLOMAX) 0.4 MG CAPS capsule Take 1 capsule (0.4 mg total) by mouth daily. 90 capsule 3   No current facility-administered medications for this visit.    Allergies  Allergen Reactions  . No Known Allergies     Family History  Problem Relation Age of Onset  . Heart disease Mother   . Stroke Mother   . Heart disease Father   . Kidney disease Neg  Hx   . Prostate cancer Neg Hx   . Kidney cancer Neg Hx   . Bladder Cancer Neg Hx     Social History   Socioeconomic History  . Marital status: Married    Spouse name: Not on file  . Number of children: 3  . Years of education: Not on file  . Highest education level: Not on file  Occupational History  . Occupation: Retired  Tobacco Use  . Smoking status: Never Smoker  . Smokeless tobacco: Never Used  Substance and Sexual Activity  . Alcohol use: No    Comment: Occasional glass of wine 3-4 times a year.  . Drug use: No  . Sexual activity: Never  Other Topics Concern  . Not on file  Social History Narrative   2 sons, 1 daughter      Living will?   Sons/daughter would be medical decision makers   Has DNR   No tube feeds if cognitively unaware   Social Determinants of Health   Financial Resource Strain: Not on file   Food Insecurity: Not on file  Transportation Needs: Not on file  Physical Activity: Not on file  Stress: Not on file  Social Connections: Not on file  Intimate Partner Violence: Not on file     Constitutional: Denies fever, malaise, fatigue, headache or abrupt weight changes.  HEENT: Denies eye pain, eye redness, ear pain, ringing in the ears, wax buildup, runny nose, nasal congestion, bloody nose, or sore throat. Respiratory: Denies difficulty breathing, shortness of breath, cough or sputum production.   Cardiovascular: Patient reports swelling in legs.  Denies chest pain, chest tightness, palpitations or swelling in the hands.  Gastrointestinal: Denies abdominal pain, bloating, constipation, diarrhea or blood in the stool.  GU: Denies urgency, frequency, pain with urination, burning sensation, blood in urine, odor or discharge. Musculoskeletal: Denies decrease in range of motion, difficulty with gait, muscle pain or joint pain and swelling.  Skin: Denies redness, rashes, lesions or ulcercations.  Neurological: Patient reports difficulty remembering.  Denies dizziness, difficulty with speech or problems with balance and coordination.  Psych: Denies anxiety, depression, SI/HI.  No other specific complaints in a complete review of systems (except as listed in HPI above).  Objective:   Physical Exam   BP 134/70   Pulse (!) 54   Temp 98.4 F (36.9 C)   Resp 17   Wt 237 lb 12.8 oz (107.9 kg)   SpO2 93%   BMI 36.16 kg/m  Wt Readings from Last 3 Encounters:  10/26/20 237 lb 12.8 oz (107.9 kg)  08/06/20 236 lb (107 kg)  04/17/20 230 lb 3.2 oz (104.4 kg)    General: Appears his stated age, obese, in NAD. Skin: Warm, dry and intact.  Vascular changes noted to BLE. HEENT: Head: normal shape and size; Eyes: sclera white and EOMs intact; Cardiovascular: Bradycardic with rhythm.  trace pitting BLE edema. Pulmonary/Chest: Normal effort and positive vesicular breath sounds. No  respiratory distress. No wheezes, rales or ronchi noted.  Abdomen: Soft and nontender. Normal bowel sounds. Musculoskeletal: Gait slow and steady without device. Neurological: Alert and oriented, slightly confused. Psychiatric: Mood and affect normal. Behavior is normal. Judgment and thought content normal.     BMET    Component Value Date/Time   NA 141 04/17/2020 1617   NA 140 05/08/2014 0000   K 4.4 04/17/2020 1617   CL 103 04/17/2020 1617   CO2 29 04/17/2020 1617   GLUCOSE 146 (H) 04/17/2020 1617  BUN 23 04/17/2020 1617   BUN 24 04/30/2015 1401   CREATININE 0.94 04/17/2020 1617   CALCIUM 9.1 04/17/2020 1617   GFRNONAA >60 08/15/2015 0418   GFRNONAA 75 12/16/2011 0842   GFRAA >60 08/15/2015 0418   GFRAA 87 12/16/2011 0842    Lipid Panel     Component Value Date/Time   CHOL 109 04/17/2020 1617   TRIG 160 (H) 04/17/2020 1617   HDL 38 (L) 04/17/2020 1617   CHOLHDL 2.9 04/17/2020 1617   VLDL 48.6 (H) 04/12/2019 1428   LDLCALC 47 04/17/2020 1617    CBC    Component Value Date/Time   WBC 8.9 09/01/2017 1210   RBC 4.44 09/01/2017 1210   HGB 14.3 09/01/2017 1210   HCT 41.8 09/01/2017 1210   PLT 179.0 09/01/2017 1210   MCV 94.3 09/01/2017 1210   MCH 32.5 08/16/2015 0339   MCHC 34.1 09/01/2017 1210   RDW 12.6 09/01/2017 1210   LYMPHSABS 1.1 09/01/2017 1210   MONOABS 0.9 09/01/2017 1210   EOSABS 0.1 09/01/2017 1210   BASOSABS 0.1 09/01/2017 1210    Hgb A1C Lab Results  Component Value Date   HGBA1C 5.5 04/12/2019          Assessment & Plan:

## 2020-10-26 NOTE — Assessment & Plan Note (Signed)
I wonder if his CPAP machine is working properly He would likely need a repeat sleep study in order to get a new machine We will monitor his symptoms for now

## 2020-10-29 DIAGNOSIS — E785 Hyperlipidemia, unspecified: Secondary | ICD-10-CM | POA: Diagnosis not present

## 2020-10-29 DIAGNOSIS — I10 Essential (primary) hypertension: Secondary | ICD-10-CM | POA: Diagnosis not present

## 2020-10-29 DIAGNOSIS — I739 Peripheral vascular disease, unspecified: Secondary | ICD-10-CM | POA: Diagnosis not present

## 2020-11-03 ENCOUNTER — Other Ambulatory Visit: Payer: Self-pay | Admitting: Internal Medicine

## 2020-11-06 DIAGNOSIS — Z23 Encounter for immunization: Secondary | ICD-10-CM | POA: Diagnosis not present

## 2020-11-12 DIAGNOSIS — G3184 Mild cognitive impairment, so stated: Secondary | ICD-10-CM | POA: Diagnosis not present

## 2020-11-12 DIAGNOSIS — M6281 Muscle weakness (generalized): Secondary | ICD-10-CM | POA: Diagnosis not present

## 2020-11-25 DIAGNOSIS — M6281 Muscle weakness (generalized): Secondary | ICD-10-CM | POA: Diagnosis not present

## 2020-11-25 DIAGNOSIS — R7303 Prediabetes: Secondary | ICD-10-CM | POA: Diagnosis not present

## 2020-11-25 DIAGNOSIS — Z833 Family history of diabetes mellitus: Secondary | ICD-10-CM | POA: Diagnosis not present

## 2020-11-27 DIAGNOSIS — M6281 Muscle weakness (generalized): Secondary | ICD-10-CM | POA: Diagnosis not present

## 2020-11-30 DIAGNOSIS — M6281 Muscle weakness (generalized): Secondary | ICD-10-CM | POA: Diagnosis not present

## 2020-12-01 ENCOUNTER — Other Ambulatory Visit: Payer: Self-pay | Admitting: Internal Medicine

## 2020-12-02 DIAGNOSIS — M6281 Muscle weakness (generalized): Secondary | ICD-10-CM | POA: Diagnosis not present

## 2020-12-04 DIAGNOSIS — M6281 Muscle weakness (generalized): Secondary | ICD-10-CM | POA: Diagnosis not present

## 2020-12-07 DIAGNOSIS — M6281 Muscle weakness (generalized): Secondary | ICD-10-CM | POA: Diagnosis not present

## 2020-12-09 DIAGNOSIS — M6281 Muscle weakness (generalized): Secondary | ICD-10-CM | POA: Diagnosis not present

## 2020-12-11 DIAGNOSIS — M6281 Muscle weakness (generalized): Secondary | ICD-10-CM | POA: Diagnosis not present

## 2020-12-14 DIAGNOSIS — M6281 Muscle weakness (generalized): Secondary | ICD-10-CM | POA: Diagnosis not present

## 2020-12-16 DIAGNOSIS — M6281 Muscle weakness (generalized): Secondary | ICD-10-CM | POA: Diagnosis not present

## 2020-12-18 DIAGNOSIS — M6281 Muscle weakness (generalized): Secondary | ICD-10-CM | POA: Diagnosis not present

## 2020-12-19 DIAGNOSIS — Z20822 Contact with and (suspected) exposure to covid-19: Secondary | ICD-10-CM | POA: Diagnosis not present

## 2020-12-21 DIAGNOSIS — M6281 Muscle weakness (generalized): Secondary | ICD-10-CM | POA: Diagnosis not present

## 2020-12-23 DIAGNOSIS — M6281 Muscle weakness (generalized): Secondary | ICD-10-CM | POA: Diagnosis not present

## 2020-12-25 DIAGNOSIS — M6281 Muscle weakness (generalized): Secondary | ICD-10-CM | POA: Diagnosis not present

## 2020-12-28 DIAGNOSIS — M6281 Muscle weakness (generalized): Secondary | ICD-10-CM | POA: Diagnosis not present

## 2020-12-30 ENCOUNTER — Other Ambulatory Visit: Payer: Self-pay | Admitting: Internal Medicine

## 2020-12-30 DIAGNOSIS — M6281 Muscle weakness (generalized): Secondary | ICD-10-CM | POA: Diagnosis not present

## 2020-12-30 DIAGNOSIS — N401 Enlarged prostate with lower urinary tract symptoms: Secondary | ICD-10-CM

## 2020-12-30 DIAGNOSIS — N138 Other obstructive and reflux uropathy: Secondary | ICD-10-CM

## 2021-01-01 DIAGNOSIS — M6281 Muscle weakness (generalized): Secondary | ICD-10-CM | POA: Diagnosis not present

## 2021-01-04 DIAGNOSIS — M6281 Muscle weakness (generalized): Secondary | ICD-10-CM | POA: Diagnosis not present

## 2021-01-06 DIAGNOSIS — M6281 Muscle weakness (generalized): Secondary | ICD-10-CM | POA: Diagnosis not present

## 2021-01-08 DIAGNOSIS — M6281 Muscle weakness (generalized): Secondary | ICD-10-CM | POA: Diagnosis not present

## 2021-01-11 DIAGNOSIS — M6281 Muscle weakness (generalized): Secondary | ICD-10-CM | POA: Diagnosis not present

## 2021-01-13 DIAGNOSIS — M6281 Muscle weakness (generalized): Secondary | ICD-10-CM | POA: Diagnosis not present

## 2021-01-15 DIAGNOSIS — M6281 Muscle weakness (generalized): Secondary | ICD-10-CM | POA: Diagnosis not present

## 2021-01-18 DIAGNOSIS — M6281 Muscle weakness (generalized): Secondary | ICD-10-CM | POA: Diagnosis not present

## 2021-01-19 ENCOUNTER — Ambulatory Visit: Payer: Self-pay | Admitting: Urology

## 2021-01-19 NOTE — Progress Notes (Signed)
12:05 PM   Christopher Jenkins 1926/07/25 [id number]  Referring provider: [name], MD [place],  [place] [address]  Urological history: 1. Nocturia -contributing factors of obstructive sleep apnea, hypertension, heart disease, cognitive decline and BPH -sleeps with CPAP  2. High risk hematuria -non-smoker -CTU in 2017 and 07/2016.  He was found to have a Bosniak 70F on the CT in 07/2015 and it remained unchanged on 07/2016 CTU -cysto 2021 NED -cytology 2021 neg -no reports of gross heme  3. Bosniak 70F cyst -MRI 05/2019 The lesion of concern adjacent to the cystic lesion in the lower pole of the left kidney has minimally enlarged compared with most recent study, although shows no definite enhancement on this examination, most consistent with a complex cyst (Bosniak 2).  Additional simple renal cysts bilaterally. No enhancing renal masses identified.  4. BPH with LU TS -cysto 2021 Trabeculated with cellules and small posterior wall diverticula -I PSS 1/1 -managed with tamsulosin 0.4 mg daily and finasteride 5 mg daily   Chief Complaint  Patient presents with   Benign Prostatic Hypertrophy    HPI: Christopher Jenkins is a 87 y.o. male who presents today for yearly follow up with his son-in-law, [name].    No  urinary complaints at this visit.  Patient denies any modifying or aggravating factors.  Patient denies any gross hematuria, dysuria or suprapubic/flank pain.  Patient denies any fevers, chills, nausea or vomiting.     IPSS     Row Name 01/20/21 1100         International Prostate Symptom Score   How often have you had the sensation of not emptying your bladder? Not at All     How often have you had to urinate less than every two hours? Not at All     How often have you found you stopped and started again several times when you urinated? Not at All     How often have you found it difficult to postpone urination? Not at All     How often  have you had a weak urinary stream? Not at All     How often have you had to strain to start urination? Not at All     How many times did you typically get up at night to urinate? 1 Time     Total IPSS Score 1           Quality of Life due to urinary symptoms     If you were to spend the rest of your life with your urinary condition just the way it is now how would you feel about that? Pleased              Score:  1-7 Mild 8-19 Moderate 20-35 Severe  PMH: Past Medical History:  Diagnosis Date   BPH (benign prostatic hyperplasia)    Duodenal mass    Elevated PSA    Gross hematuria    Hyperlipidemia    Hypertension    Hypertrophy (benign) of prostate    asymptomatic on finasteride. will f/up with urologist [name]   Kidney lesion 07/2015   pt has appt at kidney specialist 08/05/15 to review CT scan.   Obesity    Overweight    Tubular adenoma of colon    Urinary retention     Surgical History: Past Surgical History:  Procedure Laterality Date   APPENDECTOMY     cataracts  right knee surgery Right 12-17/2017   TOTAL HIP ARTHROPLASTY Right 08/13/2015   Procedure: TOTAL HIP ARTHROPLASTY;  Surgeon: [name], MD;  Location: [facility] ORS;  Service: Orthopedics;  Laterality: Right;   Uvula Surgery  1990    Home Medications:  Allergies as of 01/20/2021       Reactions   No Known Allergies         Medication List        Accurate as of January 20, 2021 12:05 PM. If you have any questions, ask your nurse or doctor.          aspirin EC 81 MG tablet Take 81 mg by mouth daily.   atorvastatin 40 MG tablet Commonly known as: LIPITOR TAKE 1 TABLET BY MOUTH DAILY.   Bystolic 2.5 MG tablet Generic drug: nebivolol TAKE 1 TABLET BY MOUTH EVERY DAY.   clopidogrel 75 MG tablet Commonly known as: PLAVIX Take 75 mg by mouth daily.   Docusate Sodium 150 MG/15ML syrup Commonly known as: Docu PLACE 3 DROPS IN EACH EAR DAILY TO PREVENT OCCLUSION WITH WAX    docusate sodium 100 MG capsule Commonly known as: Colace Take 2 capsules (200 mg total) by mouth at bedtime.   donepezil 5 MG tablet Commonly known as: ARICEPT Take 5 mg by mouth at bedtime.   finasteride 5 MG tablet Commonly known as: PROSCAR TAKE 1 TABLET BY MOUTH DAILY.   furosemide 20 MG tablet Commonly known as: LASIX TAKE 1 TABLET BY MOUTH DAILY IN THE MORNING.   memantine 10 MG tablet Commonly known as: NAMENDA Take 1 tablet (10 mg total) by mouth 2 (two) times daily.   Mucinex 600 MG 12 hr tablet Generic drug: guaiFENesin TAKE ONE TABLET BY MOUTH TWICE DAILY   multivitamin with minerals tablet Take 1 tablet by mouth daily. At 1400   potassium chloride SA 20 MEQ tablet Commonly known as: KLOR-CON TAKE ONE TABLET BY MOUTH EVERY DAY   tamsulosin 0.4 MG Caps capsule Commonly known as: FLOMAX TAKE 1 CAPSULE BY MOUTH DAILY.        Allergies:  Allergies  Allergen Reactions   No Known Allergies     Family History: Family History  Problem Relation Age of Onset   Heart disease Mother    Stroke Mother    Heart disease Father    Kidney disease Neg Hx    Prostate cancer Neg Hx    Kidney cancer Neg Hx    Bladder Cancer Neg Hx     Social History:  reports that he has never smoked. He has never used smokeless tobacco. He reports that he does not drink alcohol and does not use drugs.  ROS: For pertinent review of systems please refer to history of present illness  Physical Exam: BP 140/69   Pulse (!) 55   Ht '5\' 8"'$  (1.727 m)   Wt 240 lb (108.9 kg)   BMI 36.49 kg/m  Constitutional:  Well nourished. Alert and oriented, No acute distress. HEENT: [place] AT, mask in place.  Trachea midline Cardiovascular: No clubbing, cyanosis, or edema. Respiratory: Normal respiratory effort, no increased work of breathing. Neurologic: Grossly intact, no focal deficits, moving all 4 extremities. Psychiatric: Normal mood and affect.   Laboratory Data: Results for orders  placed or performed in visit on 04/17/20  Comprehensive metabolic panel  Result Value Ref Range   Glucose, Bld 146 (H) 65 - 99 mg/dL   BUN 23 7 - 25 mg/dL   Creat 0.94 0.70 -  1.11 mg/dL   BUN/Creatinine Ratio NOT APPLICABLE 6 - 22 (calc)   Sodium 141 135 - 146 mmol/L   Potassium 4.4 3.5 - 5.3 mmol/L   Chloride 103 98 - 110 mmol/L   CO2 29 20 - 32 mmol/L   Calcium 9.1 8.6 - 10.3 mg/dL   Total Protein 5.5 (L) 6.1 - 8.1 g/dL   Albumin 3.5 (L) 3.6 - 5.1 g/dL   Globulin 2.0 1.9 - 3.7 g/dL (calc)   AG Ratio 1.8 1.0 - 2.5 (calc)   Total Bilirubin 0.4 0.2 - 1.2 mg/dL   Alkaline phosphatase (APISO) 94 35 - 144 U/L   AST 18 10 - 35 U/L   ALT 14 9 - 46 U/L  Lipid panel  Result Value Ref Range   Cholesterol 109 <200 mg/dL   HDL 38 (L) > OR = 40 mg/dL   Triglycerides 160 (H) <150 mg/dL   LDL Cholesterol (Calc) 47 mg/dL (calc)   Total CHOL/HDL Ratio 2.9 <5.0 (calc)   Non-HDL Cholesterol (Calc) 71 <130 mg/dL (calc)  I have reviewed the labs.   Assessment & Plan:    1. Nocturia -Compliant with CPAP use   2. High risk hematuria -Hematuria work up demonstrated an enlarged prostate -No reports of gross hematuria  3. Left Bosniak 2 cyst MRI demonstrates a Bosniak 2 cyst - no further follow up is warranted   4. BPH with LUTS -Continue tamsulosin 0.4 mg daily and finasteride 5 mg daily  Return in about 1 year (around 01/20/2022) for I PSS .  Zara Council, PA-C  [facility] [address] [place], [place] [address] [phone]

## 2021-01-20 ENCOUNTER — Encounter: Payer: Self-pay | Admitting: Urology

## 2021-01-20 ENCOUNTER — Other Ambulatory Visit: Payer: Self-pay

## 2021-01-20 ENCOUNTER — Ambulatory Visit (INDEPENDENT_AMBULATORY_CARE_PROVIDER_SITE_OTHER): Payer: Medicare Other | Admitting: Urology

## 2021-01-20 VITALS — BP 140/69 | HR 55 | Ht 68.0 in | Wt 240.0 lb

## 2021-01-20 DIAGNOSIS — R319 Hematuria, unspecified: Secondary | ICD-10-CM

## 2021-01-20 DIAGNOSIS — N281 Cyst of kidney, acquired: Secondary | ICD-10-CM | POA: Diagnosis not present

## 2021-01-20 DIAGNOSIS — N401 Enlarged prostate with lower urinary tract symptoms: Secondary | ICD-10-CM | POA: Diagnosis not present

## 2021-01-20 DIAGNOSIS — N138 Other obstructive and reflux uropathy: Secondary | ICD-10-CM | POA: Diagnosis not present

## 2021-01-20 DIAGNOSIS — R351 Nocturia: Secondary | ICD-10-CM

## 2021-01-21 DIAGNOSIS — M6281 Muscle weakness (generalized): Secondary | ICD-10-CM | POA: Diagnosis not present

## 2021-01-26 DIAGNOSIS — M6281 Muscle weakness (generalized): Secondary | ICD-10-CM | POA: Diagnosis not present

## 2021-02-02 ENCOUNTER — Other Ambulatory Visit: Payer: Self-pay | Admitting: Internal Medicine

## 2021-02-03 ENCOUNTER — Other Ambulatory Visit: Payer: Self-pay | Admitting: Internal Medicine

## 2021-02-04 ENCOUNTER — Encounter: Payer: Self-pay | Admitting: Internal Medicine

## 2021-02-04 ENCOUNTER — Ambulatory Visit: Payer: Medicare Other | Admitting: Internal Medicine

## 2021-02-04 DIAGNOSIS — I10 Essential (primary) hypertension: Secondary | ICD-10-CM | POA: Diagnosis not present

## 2021-02-04 DIAGNOSIS — G4733 Obstructive sleep apnea (adult) (pediatric): Secondary | ICD-10-CM

## 2021-02-04 DIAGNOSIS — F0280 Dementia in other diseases classified elsewhere without behavioral disturbance: Secondary | ICD-10-CM

## 2021-02-04 DIAGNOSIS — I5032 Chronic diastolic (congestive) heart failure: Secondary | ICD-10-CM | POA: Insufficient documentation

## 2021-02-04 DIAGNOSIS — G301 Alzheimer's disease with late onset: Secondary | ICD-10-CM

## 2021-02-04 DIAGNOSIS — I872 Venous insufficiency (chronic) (peripheral): Secondary | ICD-10-CM

## 2021-02-04 DIAGNOSIS — N401 Enlarged prostate with lower urinary tract symptoms: Secondary | ICD-10-CM

## 2021-02-04 DIAGNOSIS — E785 Hyperlipidemia, unspecified: Secondary | ICD-10-CM

## 2021-02-04 DIAGNOSIS — N138 Other obstructive and reflux uropathy: Secondary | ICD-10-CM

## 2021-02-04 NOTE — Assessment & Plan Note (Signed)
Still sleeps with CPAP and is well rested

## 2021-02-04 NOTE — Assessment & Plan Note (Signed)
Does okay with the dual therapy--finasteride/tamsulosin

## 2021-02-04 NOTE — Assessment & Plan Note (Signed)
Mild and doing well with the assistance he gets in AL He asks about decreasing meds---not clear memantine is indicated at his level so will stop  Continue the donepezil

## 2021-02-04 NOTE — Progress Notes (Signed)
Subjective:    Patient ID: Christopher Jenkins, male    DOB: 1926/12/15, 87 y.o.   MRN: [id number]  HPI Visit in assisted living apartment for review of chronic health conditions Wife is here also Reviewed status with [facility] it here in [place] Has assistance for bathing Walks independently  Assist with med administration Stopped singing in church choir Memory issues are stable  Had PT due to weakness--now doing his own exercise and walking  No chest pain No SOB No edema No palpitations No dizziness or syncope  Embarrassed by weight gain Discussed prudent eating (has been stable)  Recent visit to urologist Voids okay on medication Satisfied with stream Nocturia x 1  Current Outpatient Medications on File Prior to Visit  Medication Sig Dispense Refill   aspirin EC 81 MG tablet Take 81 mg by mouth daily.     atorvastatin (LIPITOR) 40 MG tablet TAKE 1 TABLET BY MOUTH DAILY. 90 tablet 0   BYSTOLIC 2.5 MG tablet TAKE 1 TABLET BY MOUTH EVERY DAY. 90 tablet 1   clopidogrel (PLAVIX) 75 MG tablet TAKE 1 TABLET BY MOUTH DAILY. 90 tablet 1   docusate sodium (COLACE) 100 MG capsule Take 2 capsules (200 mg total) by mouth at bedtime. 60 capsule 11   Docusate Sodium (DOCU) 150 MG/15ML syrup PLACE 3 DROPS IN EACH EAR DAILY TO PREVENT OCCLUSION WITH WAX 100 mL 0   donepezil (ARICEPT) 5 MG tablet Take 5 mg by mouth at bedtime.     finasteride (PROSCAR) 5 MG tablet TAKE 1 TABLET BY MOUTH DAILY. 90 tablet 3   furosemide (LASIX) 20 MG tablet TAKE 1 TABLET BY MOUTH DAILY IN THE MORNING. 90 tablet 1   memantine (NAMENDA) 10 MG tablet Take 1 tablet (10 mg total) by mouth 2 (two) times daily. 180 tablet 1   Multiple Vitamins-Minerals (MULTIVITAMIN WITH MINERALS) tablet Take 1 tablet by mouth daily. At 1400     potassium chloride SA (KLOR-CON) 20 MEQ tablet TAKE 1 TABLET BY MOUTH DAILY 30 tablet 11   tamsulosin (FLOMAX) 0.4 MG CAPS capsule TAKE 1 CAPSULE BY MOUTH DAILY. 90 capsule 3   No  current facility-administered medications on file prior to visit.    Allergies  Allergen Reactions   No Known Allergies     Past Medical History:  Diagnosis Date   BPH (benign prostatic hyperplasia)    Duodenal mass    Elevated PSA    Gross hematuria    Hyperlipidemia    Hypertension    Hypertrophy (benign) of prostate    asymptomatic on finasteride. will f/up with urologist [name]   Kidney lesion 07/2015   pt has appt at kidney specialist 08/05/15 to review CT scan.   Obesity    Overweight    Tubular adenoma of colon    Urinary retention     Past Surgical History:  Procedure Laterality Date   APPENDECTOMY     cataracts     right knee surgery Right 12-17/2017   TOTAL HIP ARTHROPLASTY Right 08/13/2015   Procedure: TOTAL HIP ARTHROPLASTY;  Surgeon: [name], MD;  Location: [facility] ORS;  Service: Orthopedics;  Laterality: Right;   Uvula Surgery  1990    Family History  Problem Relation Age of Onset   Heart disease Mother    Stroke Mother    Heart disease Father    Kidney disease Neg Hx    Prostate cancer Neg Hx    Kidney cancer Neg Hx  Bladder Cancer Neg Hx     Social History   Socioeconomic History   Marital status: Married    Spouse name: Not on file   Number of children: 3   Years of education: Not on file   Highest education level: Not on file  Occupational History   Occupation: Retired  Tobacco Use   Smoking status: Never   Smokeless tobacco: Never  Substance and Sexual Activity   Alcohol use: No    Comment: Occasional glass of wine 3-4 times a year.   Drug use: No   Sexual activity: Never  Other Topics Concern   Not on file  Social History Narrative   2 sons, 1 daughter      Living will?   Sons/daughter would be medical decision makers   Has DNR   No tube feeds if cognitively unaware   Social Determinants of Health   Financial Resource Strain: Not on file  Food Insecurity: Not on file  Transportation Needs: Not on file   Physical Activity: Not on file  Stress: Not on file  Social Connections: Not on file  Intimate Partner Violence: Not on file   Review of Systems Sleeps okay Bowels move fine    Objective:   Physical Exam Constitutional:      Appearance: Normal appearance.  Cardiovascular:     Rate and Rhythm: Regular rhythm. Bradycardia present.     Comments: Occ skips Pulmonary:     Effort: Pulmonary effort is normal.     Breath sounds: Normal breath sounds. No wheezing or rales.  Abdominal:     Palpations: Abdomen is soft.     Tenderness: There is no abdominal tenderness.  Musculoskeletal:     Comments: Calves are full but no true edema  Skin:    Findings: No rash.  Neurological:     Mental Status: He is alert.  Psychiatric:        Mood and Affect: Mood normal.        Behavior: Behavior normal.           Assessment & Plan:

## 2021-02-04 NOTE — Assessment & Plan Note (Signed)
Cholesterol 114 on Rx and he has no vascular disease other than aortic atherosclerosis Will stop the statin Will stop DAPT---stop the plavix also

## 2021-02-04 NOTE — Assessment & Plan Note (Signed)
Will continue the lasix and potassium

## 2021-02-04 NOTE — Assessment & Plan Note (Signed)
BP Readings from Last 3 Encounters:  02/04/21 (!) 142/59  01/20/21 140/69  10/26/20 134/70   BP okay on bystolic

## 2021-03-12 DIAGNOSIS — Z23 Encounter for immunization: Secondary | ICD-10-CM | POA: Diagnosis not present

## 2021-03-24 ENCOUNTER — Non-Acute Institutional Stay: Payer: Medicare Other | Admitting: Internal Medicine

## 2021-03-24 VITALS — BP 142/71 | HR 56 | Temp 98.2°F | Resp 18 | Wt 236.7 lb

## 2021-03-24 DIAGNOSIS — I872 Venous insufficiency (chronic) (peripheral): Secondary | ICD-10-CM

## 2021-03-24 DIAGNOSIS — R635 Abnormal weight gain: Secondary | ICD-10-CM

## 2021-03-24 DIAGNOSIS — Z6835 Body mass index (BMI) 35.0-35.9, adult: Secondary | ICD-10-CM | POA: Diagnosis not present

## 2021-03-27 ENCOUNTER — Encounter: Payer: Self-pay | Admitting: Internal Medicine

## 2021-03-27 MED ORDER — FUROSEMIDE 20 MG PO TABS: 40.0000 mg | ORAL_TABLET | Freq: Every morning | ORAL | 1 refills | Status: DC

## 2021-03-27 NOTE — Addendum Note (Signed)
Addended by: Jearld Fenton on: 03/27/2021 12:49 PM   Modules accepted: Orders

## 2021-03-27 NOTE — Progress Notes (Signed)
Subjective:    Patient ID: Christopher Jenkins, male    DOB: 06/13/1927, 87 y.o.   MRN: [id number]  HPI  Asked to check resident in [address] Daughter concerned about 10 lb weight gain over the last year Hx of CVI on Furosemide, he denies swelling, cough or shortness of breath RN reports that he is very inactive, usually sleeps after breakfast and lunch, does not really engage in any activities. Resident reports he knows he has gained weight because none of his clothes fit.  Review of Systems     Past Medical History:  Diagnosis Date   BPH (benign prostatic hyperplasia)    Duodenal mass    Elevated PSA    Gross hematuria    Hyperlipidemia    Hypertension    Hypertrophy (benign) of prostate    asymptomatic on finasteride. will f/up with urologist [name]   Kidney lesion 07/2015   pt has appt at kidney specialist 08/05/15 to review CT scan.   Obesity    Overweight    Tubular adenoma of colon    Urinary retention     Current Outpatient Medications  Medication Sig Dispense Refill   aspirin EC 81 MG tablet Take 81 mg by mouth daily.     BYSTOLIC 2.5 MG tablet TAKE 1 TABLET BY MOUTH EVERY DAY. 90 tablet 1   docusate sodium (COLACE) 100 MG capsule Take 2 capsules (200 mg total) by mouth at bedtime. 60 capsule 11   donepezil (ARICEPT) 5 MG tablet Take 5 mg by mouth at bedtime.     finasteride (PROSCAR) 5 MG tablet TAKE 1 TABLET BY MOUTH DAILY. 90 tablet 3   furosemide (LASIX) 20 MG tablet TAKE 1 TABLET BY MOUTH DAILY IN THE MORNING. 90 tablet 1   Multiple Vitamins-Minerals (MULTIVITAMIN WITH MINERALS) tablet Take 1 tablet by mouth daily. At 1400     potassium chloride SA (KLOR-CON) 20 MEQ tablet TAKE 1 TABLET BY MOUTH DAILY 30 tablet 11   tamsulosin (FLOMAX) 0.4 MG CAPS capsule TAKE 1 CAPSULE BY MOUTH DAILY. 90 capsule 3   Docusate Sodium (DOCU) 150 MG/15ML syrup PLACE 3 DROPS IN EACH EAR DAILY TO PREVENT OCCLUSION WITH WAX 100 mL 0   No current facility-administered  medications for this visit.    Allergies  Allergen Reactions   No Known Allergies     Family History  Problem Relation Age of Onset   Heart disease Mother    Stroke Mother    Heart disease Father    Kidney disease Neg Hx    Prostate cancer Neg Hx    Kidney cancer Neg Hx    Bladder Cancer Neg Hx     Social History   Socioeconomic History   Marital status: Married    Spouse name: Not on file   Number of children: 3   Years of education: Not on file   Highest education level: Not on file  Occupational History   Occupation: Retired  Tobacco Use   Smoking status: Never   Smokeless tobacco: Never  Substance and Sexual Activity   Alcohol use: No    Comment: Occasional glass of wine 3-4 times a year.   Drug use: No   Sexual activity: Never  Other Topics Concern   Not on file  Social History Narrative   2 sons, 1 daughter      Living will?   Sons/daughter would be medical decision makers   Has DNR   No tube feeds if cognitively  unaware   Social Determinants of Health   Financial Resource Strain: Not on file  Food Insecurity: Not on file  Transportation Needs: Not on file  Physical Activity: Not on file  Stress: Not on file  Social Connections: Not on file  Intimate Partner Violence: Not on file     Constitutional: Pt reports weight gain. Denies fever, malaise, fatigue, headache.  Respiratory: Denies difficulty breathing, shortness of breath, cough or sputum production.   Cardiovascular: Denies chest pain, chest tightness, palpitations or swelling in the hands or feet.  Psych: Denies anxiety, depression, SI/HI.  No other specific complaints in a complete review of systems (except as listed in HPI above).  Objective:   Physical Exam   BP (!) 142/71   Pulse (!) 56   Temp 98.2 F (36.8 C)   Resp 18   Wt 236 lb 11.2 oz (107.4 kg)   BMI 35.99 kg/m  Wt Readings from Last 3 Encounters:  03/27/21 236 lb 11.2 oz (107.4 kg)  02/04/21 239 lb 12.8 oz (108.8  kg)  01/20/21 240 lb (108.9 kg)    General: Appears his stated age, obese, in NAD. Neck:  Neck supple, trachea midline. No masses, lumps or thyromegaly present.  Cardiovascular: Bradycardic with normal rhythm. S1,S2 noted.  No murmur, rubs or gallops noted. 1+ pitting BLE edema.  Pulmonary/Chest: Normal effort and positive vesicular breath sounds. No respiratory distress. No wheezes, rales or ronchi noted.  Neurological: Alert. Psychiatric: Mood and affect normal.     BMET    Component Value Date/Time   NA 141 04/17/2020 1617   NA 140 05/08/2014 0000   K 4.4 04/17/2020 1617   CL 103 04/17/2020 1617   CO2 29 04/17/2020 1617   GLUCOSE 146 (H) 04/17/2020 1617   BUN 23 04/17/2020 1617   BUN 24 04/30/2015 1401   CREATININE 0.94 04/17/2020 1617   CALCIUM 9.1 04/17/2020 1617   GFRNONAA >60 08/15/2015 0418   GFRNONAA 75 12/16/2011 0842   GFRAA >60 08/15/2015 0418   GFRAA 87 12/16/2011 0842    Lipid Panel     Component Value Date/Time   CHOL 109 04/17/2020 1617   TRIG 160 (H) 04/17/2020 1617   HDL 38 (L) 04/17/2020 1617   CHOLHDL 2.9 04/17/2020 1617   VLDL 48.6 (H) 04/12/2019 1428   LDLCALC 47 04/17/2020 1617    CBC    Component Value Date/Time   WBC 8.9 09/01/2017 1210   RBC 4.44 09/01/2017 1210   HGB 14.3 09/01/2017 1210   HCT 41.8 09/01/2017 1210   PLT 179.0 09/01/2017 1210   MCV 94.3 09/01/2017 1210   MCH 32.5 08/16/2015 0339   MCHC 34.1 09/01/2017 1210   RDW 12.6 09/01/2017 1210   LYMPHSABS 1.1 09/01/2017 1210   MONOABS 0.9 09/01/2017 1210   EOSABS 0.1 09/01/2017 1210   BASOSABS 0.1 09/01/2017 1210    Hgb A1C Lab Results  Component Value Date   HGBA1C 5.5 04/12/2019           Assessment & Plan:   Weight Gain, CVI:  He is actually starting to lose a little weight He does have some edema, will increase Furosemide to 40 mg daily Kidney function reviewed- normal He is eating well and very inactive, also likely contributing to weight gain in the  last year We can encourage him to engage in activities instead of napping after meals  Will reassess as needed Webb Silversmith, NP COVID-19 Vaccine Information can be found at: This visit occurred during  the SARS-CoV-2 public health emergency.  Safety protocols were in place, including screening questions prior to the visit, additional usage of staff PPE, and extensive cleaning of exam room while observing appropriate contact time as indicated for disinfecting solutions.

## 2021-03-27 NOTE — Patient Instructions (Signed)
Edema  Edema is when you have too much fluid in your body or under your skin. Edema may make your legs, feet, and ankles swell up. Swelling is also common in looser tissues, like around your eyes. This is a common condition. It gets more common as you get older. There are many possible causes of edema. Eating too much salt (sodium) and being on your feet or sitting for a long time can cause edema in yourlegs, feet, and ankles. Hot weather may make edema worse. Edema is usually painless. Your skin may look swollen or shiny. Follow these instructions at home: Keep the swollen body part raised (elevated) above the level of your heart when you are sitting or lying down. Do not sit still or stand for a long time. Do not wear tight clothes. Do not wear garters on your upper legs. Exercise your legs. This can help the swelling go down. Wear elastic bandages or support stockings as told by your doctor. Eat a low-salt (low-sodium) diet to reduce fluid as told by your doctor. Depending on the cause of your swelling, you may need to limit how much fluid you drink (fluid restriction). Take over-the-counter and prescription medicines only as told by your doctor. Contact a doctor if: Treatment is not working. You have heart, liver, or kidney disease and have symptoms of edema. You have sudden and unexplained weight gain. Get help right away if: You have shortness of breath or chest pain. You cannot breathe when you lie down. You have pain, redness, or warmth in the swollen areas. You have heart, liver, or kidney disease and get edema all of a sudden. You have a fever and your symptoms get worse all of a sudden. Summary Edema is when you have too much fluid in your body or under your skin. Edema may make your legs, feet, and ankles swell up. Swelling is also common in looser tissues, like around your eyes. Raise (elevate) the swollen body part above the level of your heart when you are sitting or lying  down. Follow your doctor's instructions about diet and how much fluid you can drink (fluid restriction). This information is not intended to replace advice given to you by your health care provider. Make sure you discuss any questions you have with your healthcare provider. Document Revised: 04/01/2020 Document Reviewed: 04/01/2020 Elsevier Patient Education  2022 [employer]  

## 2021-04-08 DIAGNOSIS — Z20822 Contact with and (suspected) exposure to covid-19: Secondary | ICD-10-CM | POA: Diagnosis not present

## 2021-04-20 ENCOUNTER — Encounter: Payer: Self-pay | Admitting: Radiology

## 2021-04-20 ENCOUNTER — Inpatient Hospital Stay (HOSPITAL_COMMUNITY)
Admit: 2021-04-20 | Discharge: 2021-04-20 | Disposition: A | Discharge status: Home / Self Care | Payer: Medicare Other | Attending: Internal Medicine | Admitting: Internal Medicine

## 2021-04-20 ENCOUNTER — Inpatient Hospital Stay
Admission: EM | Admit: 2021-04-20 | Discharge: 2021-04-23 | DRG: 163 | Disposition: A | Discharge status: Skilled Nursing Facility | Payer: Medicare Other | Source: Skilled Nursing Facility | Attending: Internal Medicine | Admitting: Internal Medicine

## 2021-04-20 ENCOUNTER — Emergency Department: Payer: Medicare Other

## 2021-04-20 ENCOUNTER — Other Ambulatory Visit: Payer: Self-pay

## 2021-04-20 ENCOUNTER — Inpatient Hospital Stay: Payer: Medicare Other

## 2021-04-20 DIAGNOSIS — I2699 Other pulmonary embolism without acute cor pulmonale: Secondary | ICD-10-CM | POA: Diagnosis not present

## 2021-04-20 DIAGNOSIS — R0902 Hypoxemia: Secondary | ICD-10-CM | POA: Diagnosis not present

## 2021-04-20 DIAGNOSIS — Z8249 Family history of ischemic heart disease and other diseases of the circulatory system: Secondary | ICD-10-CM

## 2021-04-20 DIAGNOSIS — G308 Other Alzheimer's disease: Secondary | ICD-10-CM | POA: Diagnosis not present

## 2021-04-20 DIAGNOSIS — J9601 Acute respiratory failure with hypoxia: Secondary | ICD-10-CM

## 2021-04-20 DIAGNOSIS — Z7982 Long term (current) use of aspirin: Secondary | ICD-10-CM | POA: Diagnosis not present

## 2021-04-20 DIAGNOSIS — R609 Edema, unspecified: Secondary | ICD-10-CM | POA: Diagnosis not present

## 2021-04-20 DIAGNOSIS — Z20822 Contact with and (suspected) exposure to covid-19: Secondary | ICD-10-CM | POA: Diagnosis present

## 2021-04-20 DIAGNOSIS — G309 Alzheimer's disease, unspecified: Secondary | ICD-10-CM | POA: Diagnosis present

## 2021-04-20 DIAGNOSIS — R001 Bradycardia, unspecified: Secondary | ICD-10-CM | POA: Diagnosis present

## 2021-04-20 DIAGNOSIS — Z79899 Other long term (current) drug therapy: Secondary | ICD-10-CM

## 2021-04-20 DIAGNOSIS — N401 Enlarged prostate with lower urinary tract symptoms: Secondary | ICD-10-CM

## 2021-04-20 DIAGNOSIS — R0602 Shortness of breath: Secondary | ICD-10-CM | POA: Diagnosis not present

## 2021-04-20 DIAGNOSIS — I2609 Other pulmonary embolism with acute cor pulmonale: Secondary | ICD-10-CM

## 2021-04-20 DIAGNOSIS — R778 Other specified abnormalities of plasma proteins: Secondary | ICD-10-CM | POA: Diagnosis present

## 2021-04-20 DIAGNOSIS — N400 Enlarged prostate without lower urinary tract symptoms: Secondary | ICD-10-CM | POA: Diagnosis present

## 2021-04-20 DIAGNOSIS — Z66 Do not resuscitate: Secondary | ICD-10-CM | POA: Diagnosis present

## 2021-04-20 DIAGNOSIS — F0280 Dementia in other diseases classified elsewhere without behavioral disturbance: Secondary | ICD-10-CM | POA: Diagnosis present

## 2021-04-20 DIAGNOSIS — Z96641 Presence of right artificial hip joint: Secondary | ICD-10-CM | POA: Diagnosis present

## 2021-04-20 DIAGNOSIS — I2693 Single subsegmental pulmonary embolism without acute cor pulmonale: Secondary | ICD-10-CM | POA: Diagnosis present

## 2021-04-20 DIAGNOSIS — I519 Heart disease, unspecified: Secondary | ICD-10-CM | POA: Diagnosis not present

## 2021-04-20 DIAGNOSIS — F02B0 Dementia in other diseases classified elsewhere, moderate, without behavioral disturbance, psychotic disturbance, mood disturbance, and anxiety: Secondary | ICD-10-CM | POA: Diagnosis present

## 2021-04-20 DIAGNOSIS — I517 Cardiomegaly: Secondary | ICD-10-CM | POA: Diagnosis not present

## 2021-04-20 DIAGNOSIS — I491 Atrial premature depolarization: Secondary | ICD-10-CM | POA: Diagnosis not present

## 2021-04-20 DIAGNOSIS — E785 Hyperlipidemia, unspecified: Secondary | ICD-10-CM | POA: Diagnosis present

## 2021-04-20 DIAGNOSIS — J90 Pleural effusion, not elsewhere classified: Secondary | ICD-10-CM | POA: Diagnosis not present

## 2021-04-20 DIAGNOSIS — N138 Other obstructive and reflux uropathy: Secondary | ICD-10-CM | POA: Diagnosis not present

## 2021-04-20 DIAGNOSIS — Z6837 Body mass index (BMI) 37.0-37.9, adult: Secondary | ICD-10-CM

## 2021-04-20 DIAGNOSIS — E669 Obesity, unspecified: Secondary | ICD-10-CM | POA: Diagnosis present

## 2021-04-20 DIAGNOSIS — Z9981 Dependence on supplemental oxygen: Secondary | ICD-10-CM | POA: Diagnosis not present

## 2021-04-20 DIAGNOSIS — R0989 Other specified symptoms and signs involving the circulatory and respiratory systems: Secondary | ICD-10-CM | POA: Diagnosis not present

## 2021-04-20 DIAGNOSIS — I82409 Acute embolism and thrombosis of unspecified deep veins of unspecified lower extremity: Secondary | ICD-10-CM

## 2021-04-20 DIAGNOSIS — I509 Heart failure, unspecified: Secondary | ICD-10-CM | POA: Diagnosis not present

## 2021-04-20 DIAGNOSIS — G301 Alzheimer's disease with late onset: Secondary | ICD-10-CM | POA: Diagnosis not present

## 2021-04-20 DIAGNOSIS — M6281 Muscle weakness (generalized): Secondary | ICD-10-CM | POA: Diagnosis not present

## 2021-04-20 DIAGNOSIS — I959 Hypotension, unspecified: Secondary | ICD-10-CM | POA: Diagnosis not present

## 2021-04-20 DIAGNOSIS — Z48812 Encounter for surgical aftercare following surgery on the circulatory system: Secondary | ICD-10-CM | POA: Diagnosis not present

## 2021-04-20 DIAGNOSIS — I10 Essential (primary) hypertension: Secondary | ICD-10-CM | POA: Diagnosis present

## 2021-04-20 DIAGNOSIS — R0689 Other abnormalities of breathing: Secondary | ICD-10-CM | POA: Diagnosis not present

## 2021-04-20 DIAGNOSIS — R404 Transient alteration of awareness: Secondary | ICD-10-CM | POA: Diagnosis not present

## 2021-04-20 DIAGNOSIS — R2681 Unsteadiness on feet: Secondary | ICD-10-CM | POA: Diagnosis not present

## 2021-04-20 LAB — COMPREHENSIVE METABOLIC PANEL
ALT: 23 U/L (ref 0–44)
AST: 24 U/L (ref 15–41)
Albumin: 3.1 g/dL — ABNORMAL LOW (ref 3.5–5.0)
Alkaline Phosphatase: 94 U/L (ref 38–126)
Anion gap: 10 (ref 5–15)
BUN: 27 mg/dL — ABNORMAL HIGH (ref 8–23)
CO2: 26 mmol/L (ref 22–32)
Calcium: 8.8 mg/dL — ABNORMAL LOW (ref 8.9–10.3)
Chloride: 104 mmol/L (ref 98–111)
Creatinine, Ser: 1.22 mg/dL (ref 0.61–1.24)
GFR, Estimated: 56 mL/min — ABNORMAL LOW (ref >60)
Glucose, Bld: 137 mg/dL — ABNORMAL HIGH (ref 70–99)
Potassium: 4.8 mmol/L (ref 3.5–5.1)
Sodium: 140 mmol/L (ref 135–145)
Total Bilirubin: 0.8 mg/dL (ref 0.3–1.2)
Total Protein: 6.1 g/dL — ABNORMAL LOW (ref 6.5–8.1)

## 2021-04-20 LAB — TROPONIN I (HIGH SENSITIVITY)
Troponin I (High Sensitivity): 109 ng/L (ref <18)
Troponin I (High Sensitivity): 115 ng/L (ref <18)

## 2021-04-20 LAB — PROTIME-INR
INR: 1.1 (ref 0.8–1.2)
Prothrombin Time: 13.8 seconds (ref 11.4–15.2)

## 2021-04-20 LAB — RESP PANEL BY RT-PCR (FLU A&B, COVID) ARPGX2
Influenza A by PCR: NEGATIVE
Influenza B by PCR: NEGATIVE
SARS Coronavirus 2 by RT PCR: NEGATIVE

## 2021-04-20 LAB — CBC
HCT: 44.3 % (ref 39.0–52.0)
Hemoglobin: 15 g/dL (ref 13.0–17.0)
MCH: 33.3 pg (ref 26.0–34.0)
MCHC: 33.9 g/dL (ref 30.0–36.0)
MCV: 98.2 fL (ref 80.0–100.0)
Platelets: 160 10*3/uL (ref 150–400)
RBC: 4.51 MIL/uL (ref 4.22–5.81)
RDW: 13 % (ref 11.5–15.5)
WBC: 11.5 10*3/uL — ABNORMAL HIGH (ref 4.0–10.5)
nRBC: 0 % (ref 0.0–0.2)

## 2021-04-20 LAB — APTT: aPTT: 28 seconds (ref 24–36)

## 2021-04-20 MED ORDER — ACETAMINOPHEN 325 MG PO TABS: 650.0000 mg | ORAL_TABLET | Freq: Four times a day (QID) | ORAL | Status: DC | PRN

## 2021-04-20 MED ORDER — ASPIRIN 81 MG PO CHEW
324.0000 mg | CHEWABLE_TABLET | Freq: Once | ORAL | Status: AC
  Administered 2021-04-20: 324 mg via ORAL
  Filled 2021-04-20: qty 4

## 2021-04-20 MED ORDER — NEBIVOLOL HCL 2.5 MG PO TABS
2.5000 mg | ORAL_TABLET | Freq: Every day | ORAL | Status: DC
  Filled 2021-04-20 (×2): qty 1

## 2021-04-20 MED ORDER — HEPARIN BOLUS VIA INFUSION
6500.0000 [IU] | Freq: Once | INTRAVENOUS | Status: AC
  Administered 2021-04-20: 6500 [IU] via INTRAVENOUS
  Filled 2021-04-20: qty 6500

## 2021-04-20 MED ORDER — FINASTERIDE 5 MG PO TABS
5.0000 mg | ORAL_TABLET | Freq: Every day | ORAL | Status: DC
  Administered 2021-04-20 – 2021-04-23 (×3): 5 mg via ORAL
  Filled 2021-04-20 (×4): qty 1

## 2021-04-20 MED ORDER — ADULT MULTIVITAMIN W/MINERALS CH
1.0000 | ORAL_TABLET | Freq: Every day | ORAL | Status: DC
Start: 2021-04-20 — End: 2021-04-24
  Administered 2021-04-20 – 2021-04-23 (×3): 1 via ORAL
  Filled 2021-04-20 (×3): qty 1

## 2021-04-20 MED ORDER — IOHEXOL 350 MG/ML SOLN
75.0000 mL | Freq: Once | INTRAVENOUS | Status: AC | PRN
  Administered 2021-04-20: 75 mL via INTRAVENOUS

## 2021-04-20 MED ORDER — TAMSULOSIN HCL 0.4 MG PO CAPS
0.4000 mg | ORAL_CAPSULE | Freq: Every day | ORAL | Status: DC
  Administered 2021-04-20 – 2021-04-23 (×3): 0.4 mg via ORAL
  Filled 2021-04-20 (×3): qty 1

## 2021-04-20 MED ORDER — POTASSIUM CHLORIDE CRYS ER 20 MEQ PO TBCR
20.0000 meq | EXTENDED_RELEASE_TABLET | Freq: Every day | ORAL | Status: DC
Start: 2021-04-20 — End: 2021-04-24
  Administered 2021-04-20 – 2021-04-23 (×3): 20 meq via ORAL
  Filled 2021-04-20: qty 2
  Filled 2021-04-20: qty 1
  Filled 2021-04-20: qty 2

## 2021-04-20 MED ORDER — DOCUSATE SODIUM 100 MG PO CAPS
200.0000 mg | ORAL_CAPSULE | Freq: Every day | ORAL | Status: DC
  Administered 2021-04-20 – 2021-04-22 (×3): 200 mg via ORAL
  Filled 2021-04-20 (×3): qty 2

## 2021-04-20 MED ORDER — ONDANSETRON HCL 4 MG/2ML IJ SOLN: 4.0000 mg | Freq: Four times a day (QID) | INTRAMUSCULAR | Status: DC | PRN

## 2021-04-20 MED ORDER — SODIUM CHLORIDE 0.9 % IV SOLN: 250.0000 mL | INTRAVENOUS | Status: DC | PRN

## 2021-04-20 MED ORDER — DONEPEZIL HCL 5 MG PO TABS
5.0000 mg | ORAL_TABLET | Freq: Every day | ORAL | Status: DC
  Administered 2021-04-20 – 2021-04-22 (×3): 5 mg via ORAL
  Filled 2021-04-20 (×4): qty 1

## 2021-04-20 MED ORDER — ONDANSETRON HCL 4 MG PO TABS: 4.0000 mg | ORAL_TABLET | Freq: Four times a day (QID) | ORAL | Status: DC | PRN

## 2021-04-20 MED ORDER — PERFLUTREN LIPID MICROSPHERE
1.0000 mL | INTRAVENOUS | Status: DC | PRN
  Administered 2021-04-20: 3 mL via INTRAVENOUS
  Filled 2021-04-20: qty 10

## 2021-04-20 MED ORDER — SODIUM CHLORIDE 0.9% FLUSH
3.0000 mL | Freq: Two times a day (BID) | INTRAVENOUS | Status: DC
  Administered 2021-04-20 – 2021-04-23 (×4): 3 mL via INTRAVENOUS

## 2021-04-20 MED ORDER — HEPARIN (PORCINE) 25000 UT/250ML-% IV SOLN
1600.0000 [IU]/h | INTRAVENOUS | Status: DC
  Administered 2021-04-20 – 2021-04-21 (×3): 1600 [IU]/h via INTRAVENOUS
  Filled 2021-04-20 (×3): qty 250

## 2021-04-20 MED ORDER — FUROSEMIDE 40 MG PO TABS
40.0000 mg | ORAL_TABLET | Freq: Every morning | ORAL | Status: DC
  Administered 2021-04-22 – 2021-04-23 (×2): 40 mg via ORAL
  Filled 2021-04-20 (×2): qty 1

## 2021-04-20 MED ORDER — SODIUM CHLORIDE 0.9% FLUSH: 3.0000 mL | INTRAVENOUS | Status: DC | PRN

## 2021-04-20 MED ORDER — ACETAMINOPHEN 650 MG RE SUPP
650.0000 mg | Freq: Four times a day (QID) | RECTAL | Status: DC | PRN
  Filled 2021-04-20: qty 1

## 2021-04-20 NOTE — ED Notes (Signed)
Critical lab result given to Dr. Quentin Cornwall Troponin 109.

## 2021-04-20 NOTE — ED Notes (Signed)
Ultrasound at bedside

## 2021-04-20 NOTE — ED Notes (Signed)
Dr. Francine Graven at bedside, increased O2 to 5L/min due to drop in saturation.

## 2021-04-20 NOTE — ED Triage Notes (Addendum)
Pt arrived via [facility] from home from assisted living facility [facility].  EMS called by assisted living facility due to low HR (reported 39-48 bpm) and low SPO2 (79-85% on room air). Pt denies normal use of O2 at home. Pt denies SOB or chest pain. Pt denies being sick or around anyone who has been sick recently. Pt reports has had some coughing with phlegm that he can't get up but has been a chronic problem. Pt previously known to have HR between 40-50 bpm. Hx: HTN. No other reported cardiac problems/conditions.  EMS vitals: Pt HR on presentation 38 bpm, in bigeminy on EMS EKG, SPO2 78% on R/A that increased to 99% on 15L/min non-rebreather, BP 142/51.   Pt is DNR- form at bedside. Son-in-law reported Fre[employer]er of financial affairs.

## 2021-04-20 NOTE — Consult Note (Signed)
ANTICOAGULATION CONSULT NOTE - Initial Consult  Pharmacy Consult for Heparin Infusion Indication: pulmonary embolus  Allergies  Allergen Reactions   No Known Allergies     Patient Measurements: Height: 5\' 8"  (172.7 cm) Weight: 108 kg (238 lb) IBW/kg (Calculated) : 68.4 Heparin Dosing Weight: 92.2 kg  Vital Signs: Temp: 98.3 F (36.8 C) (11/01 1510) Temp Source: Oral (11/01 1510) BP: 130/61 (11/01 1600) Pulse Rate: 31 (11/01 1600)  Labs: Recent Labs    04/20/21 1510  HGB 15.0  HCT 44.3  PLT 160  APTT 28  LABPROT 13.8  INR 1.1  CREATININE 1.22  TROPONINIHS 109*    Estimated Creatinine Clearance: 47 mL/min (by C-G formula based on SCr of 1.22 mg/dL).   Medical History: Past Medical History:  Diagnosis Date   BPH (benign prostatic hyperplasia)    Duodenal mass    Elevated PSA    Gross hematuria    Hyperlipidemia    Hypertension    Hypertrophy (benign) of prostate    asymptomatic on finasteride. will f/up with urologist [name]   Kidney lesion 07/2015   pt has appt at kidney specialist 08/05/15 to review CT scan.   Obesity    Overweight    Tubular adenoma of colon    Urinary retention     Medications:  No history of chronic anticoagulant use PTA.  Assessment: Pharmacy has been consulted to initiate heparin infusion on 87yo patient admitted with bradycardia and hypoxia. Upon review of CTA, has bilateral acute appearing PE. Baseline labs have been ordered and are pending.  Goal of Therapy:  Heparin level 0.3-0.7 units/ml Monitor platelets by anticoagulation protocol: Yes   Plan:  Give 6500 units bolus x 1 Start heparin infusion at 1600 units/hr Check anti-Xa level in 8 hours and daily while on heparin Continue to monitor H&H and platelets  Kami Kube A Shaia Porath 04/20/2021,5:22 PM

## 2021-04-20 NOTE — ED Notes (Signed)
Pt at CT

## 2021-04-20 NOTE — H&P (Signed)
History and Physical    Christopher Jenkins KXF:[id number] DOB: January 07, 1927 DOA: 04/20/2021  PCP: [name], MD   [facility]: [facility]  I have personally briefly reviewed patient's old medical records in [place]  Chief Complaint: I do not feel well  HPI: Christopher Jenkins is a 87 y.o. male with medical history significant for hypertension, BPH, obesity who presents to the emergency room via EMS for evaluation of feeling unwell.  The assisted facility staff called EMS because patient was found to have a low heart rate between 39 and 48 and room air pulse oximetry between 78 and 85%.  Patient denies having any chest pain or shortness of breath.  He denies having abdominal pain, no nausea, no vomiting, no dizziness, no lightheadedness, no leg swelling, no falls, no diaphoresis, no palpitations, no headache, no blurred vision or any focal deficit. He has a cough which he says is chronic but is unable to cough up any phlegm. By EMS patient was bradycardic with heart rate of 38 bpm and had room air pulse oximetry of 78%.  He was placed on 15 L of oxygen by nonrebreather and was transported to the ER. Labs show sodium 140, potassium 4.8, chloride 104, bicarb 26, glucose 137, BUN 27, creatinine 1.2, calcium 8.8, alkaline phosphatase 94, albumin 3.1, AST 24, ALT 23, total protein 6.1, troponin 109, white count 11.5, hemoglobin 15, hematocrit 44.3, MCV 98.2, RDW 13.0, platelet count 160, PT 13.8, INR 1.1 Respiratory viral panel is negative Chest x-ray reviewed by me shows a small left pleural effusion CT angiogram shows right middle, upper, lower as well as left lower segmental and subsegmental pulmonary emboli.  Subsegmental left upper lobe pulmonary emboli.  Associated right heart strain.  No pulmonary infarction.  Aortic atherosclerosis including four-vessel coronary calcification. Twelve-lead EKG reviewed by me shows sinus rhythm with ventricular  bigeminy.   ED Course: Patient is a 87 year old male who resides in an assisted living facility and who presents to the ER for evaluation of feeling unwell.  He was noted to be bradycardic and hypoxic by EMS with room air pulse oximetry between 79 and 85% and required oxygen supplementation initially at 15 L via nonrebreather mask to maintain pulse oximetry of 99%. He remains on oxygen supplementation at 5 L to maintain pulse oximetry of about 94% He had a CT angiogram which showed bilateral pulmonary emboli with RV strain. He will be admitted to the hospital for further evaluation.  Review of Systems: As per HPI otherwise all other systems reviewed and negative.    Past Medical History:  Diagnosis Date   BPH (benign prostatic hyperplasia)    Duodenal mass    Elevated PSA    Gross hematuria    Hyperlipidemia    Hypertension    Hypertrophy (benign) of prostate    asymptomatic on finasteride. will f/up with urologist [name]   Kidney lesion 07/2015   pt has appt at kidney specialist 08/05/15 to review CT scan.   Obesity    Overweight    Tubular adenoma of colon    Urinary retention     Past Surgical History:  Procedure Laterality Date   APPENDECTOMY     cataracts     right knee surgery Right 12-17/2017   TOTAL HIP ARTHROPLASTY Right 08/13/2015   Procedure: TOTAL HIP ARTHROPLASTY;  Surgeon: [name], MD;  Location: [facility] ORS;  Service: Orthopedics;  Laterality: Right;   [name]  reports that he has never smoked. He has never used smokeless tobacco. He reports that he does not drink alcohol and does not use drugs.  Allergies  Allergen Reactions   No Known Allergies     Family History  Problem Relation Age of Onset   Heart disease Mother    Stroke Mother    Heart disease Father    Kidney disease Neg Hx    Prostate cancer Neg Hx    Kidney cancer Neg Hx    Bladder Cancer Neg Hx       Prior to Admission medications   Medication Sig Start Date  End Date Taking? Authorizing Provider  aspirin EC 81 MG tablet Take 81 mg by mouth daily.    [provider]  BYSTOLIC 2.5 MG tablet TAKE 1 TABLET BY MOUTH EVERY DAY. 06/04/20   [name], MD  docusate sodium (COLACE) 100 MG capsule Take 2 capsules (200 mg total) by mouth at bedtime. 04/17/20   [name], MD  Docusate Sodium (DOCU) 150 MG/15ML syrup PLACE 3 DROPS IN [facility] EAR DAILY TO PREVENT OCCLUSION WITH WAX 01/31/20   [name], MD  donepezil (ARICEPT) 5 MG tablet Take 5 mg by mouth at bedtime.    [provider]  finasteride (PROSCAR) 5 MG tablet TAKE 1 TABLET BY MOUTH DAILY. 12/30/20   [name], MD  furosemide (LASIX) 20 MG tablet Take 2 tablets (40 mg total) by mouth every morning. 03/27/21   Jearld Fenton, NP  Multiple Vitamins-Minerals (MULTIVITAMIN WITH MINERALS) tablet Take 1 tablet by mouth daily. At 1400    [provider]  potassium chloride SA (KLOR-CON) 20 MEQ tablet TAKE 1 TABLET BY MOUTH DAILY 02/03/21   Viviana Simpler I, MD  tamsulosin (FLOMAX) 0.4 MG CAPS capsule TAKE 1 CAPSULE BY MOUTH DAILY. 12/30/20   [name], MD    Physical Exam: Vitals:   04/20/21 1521 04/20/21 1530 04/20/21 1600 04/20/21 1718  BP: (!) 138/59 (!) 131/53 130/61   Pulse: 70 62 (!) 31   Resp: 12 (!) 21 20   Temp:      TempSrc:      SpO2: 95% 95% 95%   Weight:    108 kg  Height:    5\' 8"  (1.727 m)     Vitals:   04/20/21 1521 04/20/21 1530 04/20/21 1600 04/20/21 1718  BP: (!) 138/59 (!) 131/53 130/61   Pulse: 70 62 (!) 31   Resp: 12 (!) 21 20   Temp:      TempSrc:      SpO2: 95% 95% 95%   Weight:    108 kg  Height:    5\' 8"  (1.727 m)      Constitutional: Alert and oriented x 3 . Not in any apparent distress HEENT:      Head: Normocephalic and atraumatic.         Eyes: PERLA, EOMI, Conjunctivae are normal. Sclera is non-icteric.       Mouth/Throat: Mucous membranes are moist.       Neck: Supple with no signs of  meningismus. Cardiovascular: Regular rate and rhythm. No murmurs, gallops, or rubs. 2+ symmetrical distal pulses are present . No JVD. No LE edema Respiratory: Respiratory effort normal .Lungs sounds clear bilaterally. No wheezes, crackles, or rhonchi.  Gastrointestinal: Soft, non tender, and non distended with positive bowel sounds.  Central adiposity Genitourinary: No CVA tenderness. Musculoskeletal: Nontender with normal range of motion in all extremities. No  cyanosis, or erythema of extremities. Neurologic:  Face is symmetric. Moving all extremities. No gross focal neurologic deficits . Skin: Skin is warm, dry.  No rash or ulcers Psychiatric: Mood and affect are normal    Labs on Admission: I have personally reviewed following labs and imaging studies  CBC: Recent Labs  Lab 04/20/21 1510  WBC 11.5*  HGB 15.0  HCT 44.3  MCV 98.2  PLT 878   Basic Metabolic Panel: Recent Labs  Lab 04/20/21 1510  NA 140  K 4.8  CL 104  CO2 26  GLUCOSE 137*  BUN 27*  CREATININE 1.22  CALCIUM 8.8*   GFR: Estimated Creatinine Clearance: 47 mL/min (by C-G formula based on SCr of 1.22 mg/dL). Liver Function Tests: Recent Labs  Lab 04/20/21 1510  AST 24  ALT 23  ALKPHOS 94  BILITOT 0.8  PROT 6.1*  ALBUMIN 3.1*   No results for input(s): LIPASE, AMYLASE in the last 168 hours. No results for input(s): AMMONIA in the last 168 hours. Coagulation Profile: Recent Labs  Lab 04/20/21 1510  INR 1.1   Cardiac Enzymes: No results for input(s): CKTOTAL, CKMB, CKMBINDEX, TROPONINI in the last 168 hours. BNP (last 3 results) No results for input(s): PROBNP in the last 8760 hours. HbA1C: No results for input(s): HGBA1C in the last 72 hours. CBG: No results for input(s): GLUCAP in the last 168 hours. Lipid Profile: No results for input(s): CHOL, HDL, LDLCALC, TRIG, CHOLHDL, LDLDIRECT in the last 72 hours. Thyroid Function Tests: No results for input(s): TSH, T4TOTAL, FREET4, T3FREE,  THYROIDAB in the last 72 hours. Anemia Panel: No results for input(s): VITAMINB12, FOLATE, FERRITIN, TIBC, IRON, RETICCTPCT in the last 72 hours. Urine analysis:    Component Value Date/Time   COLORURINE YELLOW (A) 07/29/2015 1003   APPEARANCEUR Cloudy (A) 01/21/2020 1104   LABSPEC 1.023 07/29/2015 1003   PHURINE 5.0 07/29/2015 1003   GLUCOSEU Negative 01/21/2020 1104   GLUCOSEU NEGATIVE 12/05/2012 1416   HGBUR NEGATIVE 07/29/2015 1003   BILIRUBINUR Negative 01/21/2020 1104   KETONESUR NEGATIVE 07/29/2015 1003   PROTEINUR Negative 01/21/2020 1104   PROTEINUR NEGATIVE 07/29/2015 1003   UROBILINOGEN 0.2 12/05/2012 1416   NITRITE Positive (A) 01/21/2020 1104   NITRITE NEGATIVE 07/29/2015 1003   LEUKOCYTESUR Trace (A) 01/21/2020 1104    Radiological Exams on Admission: CT Angio Chest PE W and/or Wo Contrast  Result Date: 04/20/2021 CLINICAL DATA:  PE suspected, high prob EXAM: CT ANGIOGRAPHY CHEST WITH CONTRAST TECHNIQUE: Multidetector CT imaging of the chest was performed using the standard protocol during bolus administration of intravenous contrast. Multiplanar CT image reconstructions and MIPs were obtained to evaluate the vascular anatomy. CONTRAST:  38mL OMNIPAQUE IOHEXOL 350 MG/ML SOLN COMPARISON:  Chest x-ray 04/20/2021, CT abdomen pelvis 08/05/2015 FINDINGS: Cardiovascular: Satisfactory opacification of the pulmonary arteries to the segmental level. Extensive right middle, upper, lower as well as left lower segmental and subsegmental pulmonary emboli. Subsegment left upper lobe pulmonary emboli also noted. Enlarged main pulmonary artery. Enlarged right to left ventricular ratio of 1.5. The heart is otherwise normal in caliber. No pericardial effusion. At least mild atherosclerotic plaque. 4-vessel coronary calcifications. Mediastinum/Nodes: No enlarged mediastinal, hilar, or axillary lymph nodes. Thyroid gland, trachea, and esophagus demonstrate no significant findings. Lungs/Pleura:  No focal consolidation. No pulmonary nodule. No pulmonary mass. Trace left pleural effusion. No right pleural effusion. No pneumothorax. Upper Abdomen: Partially visualized fluid density lesions within the kidneys likely representing simple renal cysts. Colonic diverticulosis. Otherwise no acute abnormality. Musculoskeletal:  No chest wall abnormality. No suspicious lytic or blastic osseous lesions. No acute displaced fracture. Multilevel degenerative changes of the spine. Review of the MIP images confirms the above findings. IMPRESSION: 1. Right middle, upper, lower as well as left lower segmental and subsegmental pulmonary emboli. Subsegmental left upper lobe pulmonary emboli. Associated right heart strain. No pulmonary infarction. 2. Aortic Atherosclerosis (ICD10-I70.0) including four-vessel coronary calcification. These results were called by telephone at the time of interpretation on 04/20/2021 at 4:29 pm to provider Merlyn Lot , who verbally acknowledged these results. Electronically Signed   By: Iven Finn M.D.   On: 04/20/2021 16:37   DG Chest Port 1 View  Result Date: 04/20/2021 CLINICAL DATA:  Shortness of breath. EXAM: PORTABLE CHEST 1 VIEW COMPARISON:  07/29/2015 FINDINGS: Stable cardiomediastinal contours. Aortic atherosclerosis. Decreased lung volumes. Blunting of the left costophrenic angle is noted suggestive of small left effusion. No interstitial edema or airspace disease. Degenerative changes noted within the thoracic spine. IMPRESSION: Suspect small left pleural effusion. Electronically Signed   By: Kerby Moors M.D.   On: 04/20/2021 15:42     Assessment/Plan Principal Problem:   Acute pulmonary embolism (HCC) Active Problems:   Hypertension   Alzheimer's dementia without behavioral disturbance (HCC)   BPH with obstruction/lower urinary tract symptoms      Patient is a 87 year old male admitted to the hospital for acute pulmonary embolism.    Acute pulmonary  embolism with hypoxia Continue heparin drip initiated in the ER Obtain lower extremity venous Doppler CT angiogram shows evidence of right heart strain Will obtain 2D echocardiogram to assess LVEF and rule out RV dysfunction We will request vascular surgery consult Continue oxygen supplementation to maintain pulse oximetry greater than or equal to 94% Patient will need to be assessed for home oxygen need prior to discharge     Hypertension Continue Bystolic   Dementia Stable Continue Aricept    BPH Continue finasteride and Flomax   DVT prophylaxis: Heparin Code Status: DO NOT RESUSCITATE (patient has a universal DNR) Family Communication: Attempted to call patient's daughter [name] @ [phone].  Awaiting callback Disposition Plan: Back to previous home environment Consults called: Vascular surgery Status:At the time of admission, it appears that the appropriate admission status for this patient is inpatient. This is judged to be reasonable and necessary to provide the required intensity of service to ensure the patient's safety given the presenting symptoms, physical exam findings, and initial radiographic and laboratory data in the context of their comorbid conditions. Patient requires inpatient status due to high intensity of service, high risk for further deterioration and high frequency of surveillance required.     Collier Bullock MD [facility]     04/20/2021, 5:21 PM

## 2021-04-20 NOTE — ED Provider Notes (Signed)
[facility] Emergency Department Provider Note    Event Date/Time   First MD Initiated Contact with Patient 04/20/21 1506     (approximate)  I have reviewed the triage vital signs and the nursing notes.   HISTORY  Chief Complaint Bradycardia/Hypoxia Level V Caveat:  poor historian    HPI Christopher Jenkins is a 87 y.o. male who presents to the ER for evaluation of reported low HR.  Patient denies any pain or symptoms.  He is presenting from assisted living facility.  It is unclear what prompted the call to EMS.  Reportedly had low HR in the 30's and was hypoxic ORA in the 70's.  Placed on NRB.  He denies any shortness of breath.  No Cough.  No nausea.    Past Medical History:  Diagnosis Date   BPH (benign prostatic hyperplasia)    Duodenal mass    Elevated PSA    Gross hematuria    Hyperlipidemia    Hypertension    Hypertrophy (benign) of prostate    asymptomatic on finasteride. will f/up with urologist [name]   Kidney lesion 07/2015   pt has appt at kidney specialist 08/05/15 to review CT scan.   Obesity    Overweight    Tubular adenoma of colon    Urinary retention    Family History  Problem Relation Age of Onset   Heart disease Mother    Stroke Mother    Heart disease Father    Kidney disease Neg Hx    Prostate cancer Neg Hx    Kidney cancer Neg Hx    Bladder Cancer Neg Hx    Past Surgical History:  Procedure Laterality Date   APPENDECTOMY     cataracts     right knee surgery Right 12-17/2017   TOTAL HIP ARTHROPLASTY Right 08/13/2015   Procedure: TOTAL HIP ARTHROPLASTY;  Surgeon: [name], MD;  Location: [facility] ORS;  Service: Orthopedics;  Laterality: Right;   Uvula Surgery  1990   Patient Active Problem List   Diagnosis Date Noted   Acute pulmonary embolism ([place]) 04/20/2021   Chronic venous insufficiency 02/04/2021   Aortic atherosclerosis ([place]) 08/06/2020   BPH with obstruction/lower urinary tract symptoms 03/12/2015    Constipation 05/07/2014   OSA (obstructive sleep apnea) 02/24/2013   Alzheimer's dementia without behavioral disturbance ([place]) 06/10/2012   Morbid obesity ([place]) 12/17/2011   Hyperlipidemia    Hypertension       Prior to Admission medications   Medication Sig Start Date End Date Taking? Authorizing Provider  aspirin EC 81 MG tablet Take 81 mg by mouth daily.    [provider]  BYSTOLIC 2.5 MG tablet TAKE 1 TABLET BY MOUTH EVERY DAY. 06/04/20   [name], MD  docusate sodium (COLACE) 100 MG capsule Take 2 capsules (200 mg total) by mouth at bedtime. 04/17/20   [name], MD  Docusate Sodium (DOCU) 150 MG/15ML syrup PLACE 3 DROPS IN [facility] EAR DAILY TO PREVENT OCCLUSION WITH WAX 01/31/20   [name], MD  donepezil (ARICEPT) 5 MG tablet Take 5 mg by mouth at bedtime.    [provider]  finasteride (PROSCAR) 5 MG tablet TAKE 1 TABLET BY MOUTH DAILY. 12/30/20   [name], MD  furosemide (LASIX) 20 MG tablet Take 2 tablets (40 mg total) by mouth every morning. 03/27/21   Jearld Fenton, NP  Multiple Vitamins-Minerals (MULTIVITAMIN WITH MINERALS) tablet Take 1 tablet by mouth daily. At  1400    [provider]  potassium chloride SA (KLOR-CON) 20 MEQ tablet TAKE 1 TABLET BY MOUTH DAILY 02/03/21   Viviana Simpler I, MD  tamsulosin (FLOMAX) 0.4 MG CAPS capsule TAKE 1 CAPSULE BY MOUTH DAILY. 12/30/20   [name], MD    Allergies No known allergies    Social History Social History   Tobacco Use   Smoking status: Never   Smokeless tobacco: Never  Substance Use Topics   Alcohol use: No    Comment: Occasional glass of wine 3-4 times a year.   Drug use: No    Review of Systems Patient denies headaches, rhinorrhea, blurry vision, numbness, shortness of breath, chest pain, edema, cough, abdominal pain, nausea, vomiting, diarrhea, dysuria, fevers, rashes or hallucinations unless otherwise stated above in  HPI. ____________________________________________   PHYSICAL EXAM:  VITAL SIGNS: Vitals:   04/20/21 1530 04/20/21 1600  BP: (!) 131/53 130/61  Pulse: 62 (!) 31  Resp: (!) 21 20  Temp:    SpO2: 95% 95%    Constitutional: Alert and oriented.  Eyes: Conjunctivae are normal.  Head: Atraumatic. Nose: No congestion/rhinnorhea. Mouth/Throat: Mucous membranes are moist.   Neck: No stridor. Painless ROM.  Cardiovascular: Normal rate, irregular rhythm. Grossly normal heart sounds.  Good peripheral circulation. Respiratory: Normal respiratory effort.  No retractions. Lungs with coarse bibasilar breathsounds. Gastrointestinal: Soft and nontender. No distention. No abdominal bruits. No CVA tenderness. Genitourinary:  Musculoskeletal: No lower extremity tenderness nor edema.  No joint effusions. Neurologic:  Normal speech and language. No gross focal neurologic deficits are appreciated. No facial droop Skin:  Skin is warm, dry and intact. No rash noted. Psychiatric: Mood and affect are normal. Speech and behavior are normal.  ____________________________________________   LABS (all labs ordered are listed, but only abnormal results are displayed)  Results for orders placed or performed during the hospital encounter of 04/20/21 (from the past 24 hour(s))  Troponin I (High Sensitivity)     Status: Abnormal   Collection Time: 04/20/21  3:10 PM  Result Value Ref Range   Troponin I (High Sensitivity) 109 (HH) <18 ng/L  CBC     Status: Abnormal   Collection Time: 04/20/21  3:10 PM  Result Value Ref Range   WBC 11.5 (H) 4.0 - 10.5 K/uL   RBC 4.51 4.22 - 5.81 MIL/uL   Hemoglobin 15.0 13.0 - 17.0 g/dL   HCT 44.3 39.0 - 52.0 %   MCV 98.2 80.0 - 100.0 fL   MCH 33.3 26.0 - 34.0 pg   MCHC 33.9 30.0 - 36.0 g/dL   RDW 13.0 11.5 - 15.5 %   Platelets 160 150 - 400 K/uL   nRBC 0.0 0.0 - 0.2 %  Comprehensive metabolic panel     Status: Abnormal   Collection Time: 04/20/21  3:10 PM  Result  Value Ref Range   Sodium 140 135 - 145 mmol/L   Potassium 4.8 3.5 - 5.1 mmol/L   Chloride 104 98 - 111 mmol/L   CO2 26 22 - 32 mmol/L   Glucose, Bld 137 (H) 70 - 99 mg/dL   BUN 27 (H) 8 - 23 mg/dL   Creatinine, Ser 1.22 0.61 - 1.24 mg/dL   Calcium 8.8 (L) 8.9 - 10.3 mg/dL   Total Protein 6.1 (L) 6.5 - 8.1 g/dL   Albumin 3.1 (L) 3.5 - 5.0 g/dL   AST 24 15 - 41 U/L   ALT 23 0 - 44 U/L   Alkaline Phosphatase 94 38 -  126 U/L   Total Bilirubin 0.8 0.3 - 1.2 mg/dL   GFR, Estimated 56 (L) >60 mL/min   Anion gap 10 5 - 15  APTT     Status: None   Collection Time: 04/20/21  3:10 PM  Result Value Ref Range   aPTT 28 24 - 36 seconds  Protime-INR     Status: None   Collection Time: 04/20/21  3:10 PM  Result Value Ref Range   Prothrombin Time 13.8 11.4 - 15.2 seconds   INR 1.1 0.8 - 1.2  Resp Panel by RT-PCR (Flu A&B, Covid) Nasopharyngeal Swab     Status: None   Collection Time: 04/20/21  3:30 PM   Specimen: Nasopharyngeal Swab; Nasopharyngeal(NP) swabs in vial transport medium  Result Value Ref Range   SARS Coronavirus 2 by RT PCR NEGATIVE NEGATIVE   Influenza A by PCR NEGATIVE NEGATIVE   Influenza B by PCR NEGATIVE NEGATIVE   ____________________________________________  EKG My review and personal interpretation at Time: 15:13   Indication: bradycardia  Rate: 80  Rhythm: sinus Axis: left Other: frequent pvc, no stemi, no depression ____________________________________________  RADIOLOGY  I personally reviewed all radiographic images ordered to evaluate for the above acute complaints and reviewed radiology reports and findings.  These findings were personally discussed with the patient.  Please see medical record for radiology report.  ____________________________________________   PROCEDURES  Procedure(s) performed:  .Critical Care Performed by: Merlyn Lot, MD Authorized by: Merlyn Lot, MD   Critical care provider statement:    Critical care time  (minutes):  40   Critical care was necessary to treat or prevent imminent or life-threatening deterioration of the following conditions:  Respiratory failure and circulatory failure   Critical care was time spent personally by me on the following activities:  Ordering and performing treatments and interventions, ordering and review of laboratory studies, ordering and review of radiographic studies, pulse oximetry, re-evaluation of patient's condition, review of old charts, obtaining history from patient or surrogate, examination of patient, evaluation of patient's response to treatment, discussions with primary provider, discussions with consultants and development of treatment plan with patient or surrogate .1-3 Lead EKG Interpretation Performed by: Merlyn Lot, MD Authorized by: Merlyn Lot, MD     Interpretation: abnormal     ECG rate:  60   ECG rate assessment: normal     Rhythm: sinus rhythm     Ectopy: PVCs      Critical Care performed: yes ____________________________________________   INITIAL IMPRESSION / ASSESSMENT AND PLAN / ED COURSE  Pertinent labs & imaging results that were available during my care of the patient were reviewed by me and considered in my medical decision making (see chart for details).   DDX: bradycardia, electrolyte abn, chf, effusion, copd, pe, anemia, sepsis  Christopher Jenkins is a 87 y.o. who presents to the ED with report of low heart rate and hypoxia.  Patient has no specific complaints but is hypoxic.  Has not some lower extremity swelling on exam.  Denies any pain.  EKG with some nonspecific ST changes no STEMI criteria with frequent PVCs.  No significant wheezing on exam.  The patient will be placed on continuous pulse oximetry and telemetry for monitoring.  Laboratory evaluation will be sent to evaluate for the above complaints.     Clinical Course as of 04/20/21 1715  Tue Apr 20, 2021  1616 Patient with noted troponin elevation.  She is  he is denying any chest pain at this time.  Given his mild hypoxia and some presentation and comorbidities will order CTA to evaluate for PE and better characterize effusion noted on chest x-ray.  Will be given aspirin. [PR]  1623 On my review of the patient's CTA does appear that he has bilateral acute appearing PEs.  Will heparinize.  Will require admission. [PR]  1648 Have attempted to reach [place], [name].  Left message.  Hospitalist has agreed to admit for further evaluation. [PR]    Clinical Course User Index [PR] Merlyn Lot, MD    The patient was evaluated in Emergency Department today for the symptoms described in the history of present illness. He/she was evaluated in the context of the global COVID-19 pandemic, which necessitated consideration that the patient might be at risk for infection with the SARS-CoV-2 virus that causes COVID-19. Institutional protocols and algorithms that pertain to the evaluation of patients at risk for COVID-19 are in a state of rapid change based on information released by regulatory bodies including the CDC and federal and state organizations. These policies and algorithms were followed during the patient's care in the ED.  As part of my medical decision making, I reviewed the following data within the [place] notes reviewed and incorporated, Labs reviewed, notes from prior ED visits and [place] Controlled Substance Database   ____________________________________________   FINAL CLINICAL IMPRESSION(S) / ED DIAGNOSES  Final diagnoses:  SOB (shortness of breath)  DVT (deep venous thrombosis) ([facility])  PE (pulmonary thromboembolism) ([place])  Acute respiratory failure with hypoxia ([place])      NEW MEDICATIONS STARTED DURING THIS VISIT:  New Prescriptions   No medications on file     Note:  This document was prepared using Dragon voice recognition software and may include unintentional dictation errors.    Merlyn Lot,  MD 04/20/21 [phone]

## 2021-04-21 ENCOUNTER — Encounter: Payer: Self-pay | Admitting: Vascular Surgery

## 2021-04-21 ENCOUNTER — Encounter
Admission: EM | Disposition: A | Discharge status: Skilled Nursing Facility | Payer: Self-pay | Source: Skilled Nursing Facility | Attending: Internal Medicine

## 2021-04-21 DIAGNOSIS — R001 Bradycardia, unspecified: Secondary | ICD-10-CM

## 2021-04-21 DIAGNOSIS — I2699 Other pulmonary embolism without acute cor pulmonale: Secondary | ICD-10-CM

## 2021-04-21 HISTORY — PX: PULMONARY THROMBECTOMY: CATH118295

## 2021-04-21 LAB — CBC
HCT: 44.1 % (ref 39.0–52.0)
Hemoglobin: 14.5 g/dL (ref 13.0–17.0)
MCH: 32.5 pg (ref 26.0–34.0)
MCHC: 32.9 g/dL (ref 30.0–36.0)
MCV: 98.9 fL (ref 80.0–100.0)
Platelets: 149 10*3/uL — ABNORMAL LOW (ref 150–400)
RBC: 4.46 MIL/uL (ref 4.22–5.81)
RDW: 13.1 % (ref 11.5–15.5)
WBC: 9.3 10*3/uL (ref 4.0–10.5)
nRBC: 0 % (ref 0.0–0.2)

## 2021-04-21 LAB — BASIC METABOLIC PANEL
Anion gap: 7 (ref 5–15)
BUN: 24 mg/dL — ABNORMAL HIGH (ref 8–23)
CO2: 29 mmol/L (ref 22–32)
Calcium: 8.5 mg/dL — ABNORMAL LOW (ref 8.9–10.3)
Chloride: 103 mmol/L (ref 98–111)
Creatinine, Ser: 1.1 mg/dL (ref 0.61–1.24)
GFR, Estimated: >60 mL/min (ref >60)
Glucose, Bld: 108 mg/dL — ABNORMAL HIGH (ref 70–99)
Potassium: 4.5 mmol/L (ref 3.5–5.1)
Sodium: 139 mmol/L (ref 135–145)

## 2021-04-21 LAB — ECHOCARDIOGRAM COMPLETE
Area-P 1/2: 3.77 cm2
Height: 68 in
S' Lateral: 2.78 cm
Weight: 3808 oz

## 2021-04-21 LAB — HEPARIN LEVEL (UNFRACTIONATED)
Heparin Unfractionated: 0.7 IU/mL (ref 0.30–0.70)
Heparin Unfractionated: 0.5 IU/mL (ref 0.30–0.70)

## 2021-04-21 SURGERY — PULMONARY THROMBECTOMY
Anesthesia: Moderate Sedation | Laterality: Bilateral

## 2021-04-21 MED ORDER — MIDAZOLAM HCL 2 MG/2ML IJ SOLN
INTRAMUSCULAR | Status: AC
  Filled 2021-04-21: qty 2

## 2021-04-21 MED ORDER — ALTEPLASE 2 MG IJ SOLR
INTRAMUSCULAR | Status: AC
  Filled 2021-04-21: qty 8

## 2021-04-21 MED ORDER — MIDAZOLAM HCL 2 MG/2ML IJ SOLN
INTRAMUSCULAR | Status: DC | PRN
  Administered 2021-04-21: 1 mg via INTRAVENOUS
  Administered 2021-04-21: .5 mg via INTRAVENOUS

## 2021-04-21 MED ORDER — FENTANYL CITRATE (PF) 100 MCG/2ML IJ SOLN
INTRAMUSCULAR | Status: DC | PRN
  Administered 2021-04-21: 50 ug via INTRAVENOUS
  Administered 2021-04-21: 12.5 ug via INTRAVENOUS

## 2021-04-21 MED ORDER — CEFAZOLIN SODIUM-DEXTROSE 2-4 GM/100ML-% IV SOLN
2.0000 g | INTRAVENOUS | Status: AC
  Administered 2021-04-21: 2 g via INTRAVENOUS
  Filled 2021-04-21: qty 100

## 2021-04-21 MED ORDER — CEFAZOLIN SODIUM-DEXTROSE 2-4 GM/100ML-% IV SOLN
INTRAVENOUS | Status: AC
  Filled 2021-04-21: qty 100

## 2021-04-21 MED ORDER — ALTEPLASE 2 MG IJ SOLR
INTRAMUSCULAR | Status: DC | PRN
  Administered 2021-04-21: 5 mg

## 2021-04-21 MED ORDER — SODIUM CHLORIDE 0.9 % IV SOLN: INTRAVENOUS | Status: DC

## 2021-04-21 MED ORDER — IODIXANOL 320 MG/ML IV SOLN
INTRAVENOUS | Status: DC | PRN
  Administered 2021-04-21: 4320 mL via INTRAVENOUS

## 2021-04-21 MED ORDER — HEPARIN SODIUM (PORCINE) 1000 UNIT/ML IJ SOLN
INTRAMUSCULAR | Status: AC
  Filled 2021-04-21: qty 1

## 2021-04-21 MED ORDER — HYDRALAZINE HCL 20 MG/ML IJ SOLN: 10.0000 mg | INTRAMUSCULAR | Status: DC | PRN

## 2021-04-21 MED ORDER — FENTANYL CITRATE PF 50 MCG/ML IJ SOSY
PREFILLED_SYRINGE | INTRAMUSCULAR | Status: AC
  Filled 2021-04-21: qty 1

## 2021-04-21 SURGICAL SUPPLY — 13 items
CANISTER PENUMBRA ENGINE (MISCELLANEOUS) ×1 IMPLANT
CATH ANGIO 5F PIGTAIL 100CM (CATHETERS) ×1 IMPLANT
CATH INDIGO SEP 8 (CATHETERS) ×1 IMPLANT
CATH INFINITI JR4 5F (CATHETERS) ×1 IMPLANT
CATH LIGHTNING 8 XTORQ 115 (CATHETERS) ×1 IMPLANT
COVER PROBE U/S 5X48 (MISCELLANEOUS) ×1 IMPLANT
GLIDEWIRE ADV .035X180CM (WIRE) ×1 IMPLANT
PACK ANGIOGRAPHY (CUSTOM PROCEDURE TRAY) ×2 IMPLANT
SHEATH 9FRX11 (SHEATH) ×1 IMPLANT
SHEATH BRITE TIP 5FRX11 (SHEATH) ×1 IMPLANT
TUBING CONTRAST HIGH PRESS 72 (TUBING) ×1 IMPLANT
WIRE GUIDERIGHT .035X150 (WIRE) ×1 IMPLANT
WIRE MAGIC TORQUE 260C (WIRE) ×1 IMPLANT

## 2021-04-21 NOTE — Consult Note (Signed)
Christopher Jenkins  MRN : [id number]  Christopher Jenkins is a 87 y.o. (23-Mar-1927) male who presents with chief complaint of  Chief Complaint  Patient presents with   Bradycardia  .  History of Present Illness: I am asked to see the patient by Dr. Priscella Mann for evaluation of a patient with a submassive pulmonary embolus bilaterally with right heart strain.  He also has an elevated troponin.  He feels poorly.  He still feels short of breath.  He was started on heparin last night.  He is still requiring 5 L nasal cannula.  Some mild vague bilateral chest pain and nausea.  No fevers or chills.  Duplex of the lower extremity shows no residual lower extremity DVT.  Tolerating anticoagulation so far.  Current Facility-Administered Medications  Medication Dose Route Frequency Provider Last Rate Last Admin   0.9 %  sodium chloride infusion  250 mL Intravenous PRN Agbata, Tochukwu, MD       acetaminophen (TYLENOL) tablet 650 mg  650 mg Oral Q6H PRN Agbata, Tochukwu, MD       Or   acetaminophen (TYLENOL) suppository 650 mg  650 mg Rectal Q6H PRN Agbata, Tochukwu, MD       docusate sodium (COLACE) capsule 200 mg  200 mg Oral QHS Agbata, Tochukwu, MD   200 mg at 04/20/21 2357   donepezil (ARICEPT) tablet 5 mg  5 mg Oral QHS Agbata, Tochukwu, MD   5 mg at 04/20/21 2357   finasteride (PROSCAR) tablet 5 mg  5 mg Oral Daily Agbata, Tochukwu, MD   5 mg at 04/20/21 2357   furosemide (LASIX) tablet 40 mg  40 mg Oral q morning Agbata, Tochukwu, MD       heparin ADULT infusion 100 units/mL (25000 units/22mL)  1,600 Units/hr Intravenous Continuous Rito Ehrlich A, RPH 16 mL/hr at 04/21/21 0514 1,600 Units/hr at 04/21/21 0514   multivitamin with minerals tablet 1 tablet  1 tablet Oral Daily Agbata, Tochukwu, MD   1 tablet at 04/20/21 1744   nebivolol (BYSTOLIC) tablet 2.5 mg  2.5 mg Oral Daily Agbata, Tochukwu, MD       ondansetron (ZOFRAN) tablet 4 mg  4 mg Oral Q6H PRN  Agbata, Tochukwu, MD       Or   ondansetron (ZOFRAN) injection 4 mg  4 mg Intravenous Q6H PRN Agbata, Tochukwu, MD       potassium chloride SA (KLOR-CON) CR tablet 20 mEq  20 mEq Oral Daily Agbata, Tochukwu, MD   20 mEq at 04/20/21 1744   sodium chloride flush (NS) 0.9 % injection 3 mL  3 mL Intravenous Q12H Agbata, Tochukwu, MD   3 mL at 04/20/21 2357   sodium chloride flush (NS) 0.9 % injection 3 mL  3 mL Intravenous PRN Agbata, Tochukwu, MD       tamsulosin (FLOMAX) capsule 0.4 mg  0.4 mg Oral Daily Agbata, Tochukwu, MD   0.4 mg at 04/20/21 1744   Current Outpatient Medications  Medication Sig Dispense Refill   aspirin EC 81 MG tablet Take 81 mg by mouth daily.     BYSTOLIC 2.5 MG tablet TAKE 1 TABLET BY MOUTH EVERY DAY. 90 tablet 1   docusate sodium (COLACE) 100 MG capsule Take 2 capsules (200 mg total) by mouth at bedtime. 60 capsule 11   Docusate Sodium (DOCU) 150 MG/15ML syrup PLACE 3 DROPS IN EACH EAR DAILY TO PREVENT OCCLUSION WITH WAX 100 mL 0   donepezil (ARICEPT) 5  MG tablet Take 5 mg by mouth at bedtime.     finasteride (PROSCAR) 5 MG tablet TAKE 1 TABLET BY MOUTH DAILY. 90 tablet 3   furosemide (LASIX) 20 MG tablet Take 2 tablets (40 mg total) by mouth every morning. 180 tablet 1   Multiple Vitamins-Minerals (MULTIVITAMIN WITH MINERALS) tablet Take 1 tablet by mouth daily. At 1400     potassium chloride SA (KLOR-CON) 20 MEQ tablet TAKE 1 TABLET BY MOUTH DAILY 30 tablet 11   tamsulosin (FLOMAX) 0.4 MG CAPS capsule TAKE 1 CAPSULE BY MOUTH DAILY. 90 capsule 3    Past Medical History:  Diagnosis Date   BPH (benign prostatic hyperplasia)    Duodenal mass    Elevated PSA    Gross hematuria    Hyperlipidemia    Hypertension    Hypertrophy (benign) of prostate    asymptomatic on finasteride. will f/up with urologist [name]   Kidney lesion 07/2015   pt has appt at kidney specialist 08/05/15 to review CT scan.   Obesity    Overweight    Tubular adenoma of colon     Urinary retention     Past Surgical History:  Procedure Laterality Date   APPENDECTOMY     cataracts     right knee surgery Right 12-17/2017   TOTAL HIP ARTHROPLASTY Right 08/13/2015   Procedure: TOTAL HIP ARTHROPLASTY;  Surgeon: [name], MD;  Location: [facility] ORS;  Service: Orthopedics;  Laterality: Right;   Uvula Surgery  1990     Social History   Tobacco Use   Smoking status: Never   Smokeless tobacco: Never  Substance Use Topics   Alcohol use: No    Comment: Occasional glass of wine 3-4 times a year.   Drug use: No     Family History  Problem Relation Age of Onset   Heart disease Mother    Stroke Mother    Heart disease Father    Kidney disease Neg Hx    Prostate cancer Neg Hx    Kidney cancer Neg Hx    Bladder Cancer Neg Hx     Allergies  Allergen Reactions   No Known Allergies      REVIEW OF SYSTEMS (Negative unless checked)  Constitutional: [] Weight loss  [] Fever  [] Chills Cardiac: [x] Chest pain   [] Chest pressure   [] Palpitations   [] Shortness of breath when laying flat   [x] Shortness of breath at rest   [x] Shortness of breath with exertion. Vascular:  [] Pain in legs with walking   [] Pain in legs at rest   [] Pain in legs when laying flat   [] Claudication   [] Pain in feet when walking  [] Pain in feet at rest  [] Pain in feet when laying flat   [] History of DVT   [] Phlebitis   [] Swelling in legs   [] Varicose veins   [] Non-healing ulcers Pulmonary:   [] Uses home oxygen   [x] Productive cough   [] Hemoptysis   [] Wheeze  [] COPD   [] Asthma Neurologic:  [] Dizziness  [] Blackouts   [] Seizures   [] History of stroke   [] History of TIA  [] Aphasia   [] Temporary blindness   [] Dysphagia   [] Weakness or numbness in arms   [] Weakness or numbness in legs Musculoskeletal:  [x] Arthritis   [] Joint swelling   [] Joint pain   [] Low back pain Hematologic:  [] Easy bruising  [] Easy bleeding   [] Hypercoagulable state   [] Anemic  [] Hepatitis Gastrointestinal:  [] Blood in stool    [] Vomiting blood  [] Gastroesophageal reflux/heartburn   [] Difficulty swallowing.  Genitourinary:  [] Chronic kidney disease   [] Difficult urination  [x] Frequent urination  [] Burning with urination   [] Blood in urine Skin:  [] Rashes   [] Ulcers   [] Wounds Psychological:  [] History of anxiety   []  History of major depression.  Physical Examination  Vitals:   04/20/21 2357 04/21/21 0130 04/21/21 0445 04/21/21 0700  BP: 132/79 112/62 117/67 102/69  Pulse: (!) 50 (!) 49 (!) 44 (!) 44  Resp: 16 18 17 18   Temp:      TempSrc:      SpO2: 92% 92% 94% 95%  Weight:      Height:       Body mass index is 36.19 kg/m. Gen:  WD/WN, NAD. Appears younger than stated age. Head: [place]/AT, No temporalis wasting.  Ear/Nose/Throat: Hearing diminished, nares w/o erythema or drainage, oropharynx w/o Erythema/Exudate Eyes: Sclera non-icteric, conjunctiva clear Neck: Trachea midline.  No JVD.  Pulmonary:  Good air movement, respirations not labored, equal bilaterally.  Cardiac: Bradycardic Vascular:  Vessel Right Left  Radial Palpable Palpable                                   Gastrointestinal: soft, non-tender/non-distended. No guarding/reflex.  Musculoskeletal: M/S 5/5 throughout.  Extremities without ischemic changes.  No deformity or atrophy. Mild LE edema. Neurologic: Sensation grossly intact in extremities.  Symmetrical.  Speech is fluent. Motor exam as listed above. Psychiatric: Judgment intact, Mood & affect appropriate for pt's clinical situation. Dermatologic: No rashes or ulcers noted.  No cellulitis or open wounds.      CBC Lab Results  Component Value Date   WBC 9.3 04/21/2021   HGB 14.5 04/21/2021   HCT 44.1 04/21/2021   MCV 98.9 04/21/2021   PLT 149 (L) 04/21/2021    BMET    Component Value Date/Time   NA 139 04/21/2021 0606   NA 140 05/08/2014 0000   K 4.5 04/21/2021 0606   CL 103 04/21/2021 0606   CO2 29 04/21/2021 0606   GLUCOSE 108 (H) 04/21/2021 0606   BUN 24 (H)  04/21/2021 0606   BUN 24 04/30/2015 1401   CREATININE 1.10 04/21/2021 0606   CREATININE 0.94 04/17/2020 1617   CALCIUM 8.5 (L) 04/21/2021 0606   GFRNONAA >60 04/21/2021 0606   GFRNONAA 75 12/16/2011 0842   GFRAA >60 08/15/2015 0418   GFRAA 87 12/16/2011 0842   Estimated Creatinine Clearance: 52.1 mL/min (by C-G formula based on SCr of 1.1 mg/dL).  COAG Lab Results  Component Value Date   INR 1.1 04/20/2021   INR 1.1 (H) 04/14/2016   INR 1.13 07/29/2015    Radiology CT Angio Chest PE W and/or Wo Contrast  Result Date: 04/20/2021 CLINICAL DATA:  PE suspected, high prob EXAM: CT ANGIOGRAPHY CHEST WITH CONTRAST TECHNIQUE: Multidetector CT imaging of the chest was performed using the standard protocol during bolus administration of intravenous contrast. Multiplanar CT image reconstructions and MIPs were obtained to evaluate the vascular anatomy. CONTRAST:  76mL OMNIPAQUE IOHEXOL 350 MG/ML SOLN COMPARISON:  Chest x-ray 04/20/2021, CT abdomen pelvis 08/05/2015 FINDINGS: Cardiovascular: Satisfactory opacification of the pulmonary arteries to the segmental level. Extensive right middle, upper, lower as well as left lower segmental and subsegmental pulmonary emboli. Subsegment left upper lobe pulmonary emboli also noted. Enlarged main pulmonary artery. Enlarged right to left ventricular ratio of 1.5. The heart is otherwise normal in caliber. No pericardial effusion. At least mild atherosclerotic plaque. 4-vessel coronary calcifications. Mediastinum/Nodes:  No enlarged mediastinal, hilar, or axillary lymph nodes. Thyroid gland, trachea, and esophagus demonstrate no significant findings. Lungs/Pleura: No focal consolidation. No pulmonary nodule. No pulmonary mass. Trace left pleural effusion. No right pleural effusion. No pneumothorax. Upper Abdomen: Partially visualized fluid density lesions within the kidneys likely representing simple renal cysts. Colonic diverticulosis. Otherwise no acute abnormality.  Musculoskeletal: No chest wall abnormality. No suspicious lytic or blastic osseous lesions. No acute displaced fracture. Multilevel degenerative changes of the spine. Review of the MIP images confirms the above findings. IMPRESSION: 1. Right middle, upper, lower as well as left lower segmental and subsegmental pulmonary emboli. Subsegmental left upper lobe pulmonary emboli. Associated right heart strain. No pulmonary infarction. 2. Aortic Atherosclerosis (ICD10-I70.0) including four-vessel coronary calcification. These results were called by telephone at the time of interpretation on 04/20/2021 at 4:29 pm to provider Merlyn Lot , who verbally acknowledged these results. Electronically Signed   By: Iven Finn M.D.   On: 04/20/2021 16:37   US Venous Img Lower Bilateral (DVT)  Result Date: 04/20/2021 CLINICAL DATA:  PE seen on a CTA of the chest today. Assess for venous thrombosis. EXAM: BILATERAL LOWER EXTREMITY VENOUS DOPPLER ULTRASOUND TECHNIQUE: Gray-scale sonography with compression, as well as color and duplex ultrasound, were performed to evaluate the deep venous system(s) from the level of the common femoral vein through the popliteal and proximal calf veins. COMPARISON:  None. FINDINGS: VENOUS Normal compressibility of the common femoral, superficial femoral, and popliteal veins, as well as the visualized calf veins. Visualized portions of profunda femoral vein and great saphenous vein unremarkable. No filling defects to suggest DVT on grayscale or color Doppler imaging. Doppler waveforms show normal direction of venous flow, normal respiratory plasticity and response to augmentation. Assessment of the peroneal vein the left with limited. OTHER None. Limitations: none IMPRESSION: No evidence of a deep venous thrombosis of either lower extremity. Electronically Signed   By: Lajean Manes M.D.   On: 04/20/2021 18:36   DG Chest Port 1 View  Result Date: 04/20/2021 CLINICAL DATA:  Shortness of  breath. EXAM: PORTABLE CHEST 1 VIEW COMPARISON:  07/29/2015 FINDINGS: Stable cardiomediastinal contours. Aortic atherosclerosis. Decreased lung volumes. Blunting of the left costophrenic angle is noted suggestive of small left effusion. No interstitial edema or airspace disease. Degenerative changes noted within the thoracic spine. IMPRESSION: Suspect small left pleural effusion. Electronically Signed   By: Kerby Moors M.D.   On: 04/20/2021 15:42      Assessment/Plan 1.  Pulmonary embolus with right heart strain.  Elevated troponin as well.  Patient is requiring 5 L nasal cannula to keep saturations up.  He would benefit from pulmonary thrombectomy and we will plan on doing this today.  I discussed the procedure in detail with the patient.  I discussed the risks and benefits the procedure. 2. Hypertension. Stable on outpatient medications and blood pressure control important in reducing the progression of atherosclerotic disease. On appropriate oral medications. 3. Hyperlipidemia. lipid control important in reducing the progression of atherosclerotic disease. Continue statin therapy    Leotis Pain, MD  04/21/2021 8:23 AM    This Jenkins was created with Dragon medical transcription system.  Any error is purely unintentional

## 2021-04-21 NOTE — Progress Notes (Signed)
PROGRESS NOTE    CLIFORD Jenkins  VZD:[id number] DOB: [birth year] DOA: 04/20/2021 PCP: [name], MD    Brief Narrative:  87 y.o. male with medical history significant for hypertension, BPH, obesity who presents to the emergency room via EMS for evaluation of feeling unwell.  The assisted facility staff called EMS because patient was found to have a low heart rate between 39 and 48 and room air pulse oximetry between 78 and 85%.  Patient denies having any chest pain or shortness of breath.  He denies having abdominal pain, no nausea, no vomiting, no dizziness, no lightheadedness, no leg swelling, no falls, no diaphoresis, no palpitations, no headache, no blurred vision or any focal deficit. He has a cough which he says is chronic but is unable to cough up any phlegm.  CT imaging survey significant for bilateral multifocal pulmonary emboli with right heart strain.  Consistent with submassive pulmonary embolism.  Vascular surgery consulted.  Status post pulmonary thrombectomy on 11/2.  Tolerated procedure well.  Remains on 5 L.   Assessment & Plan:   Principal Problem:   Acute pulmonary embolism (HCC) Active Problems:   Hypertension   Alzheimer's dementia without behavioral disturbance (HCC)   BPH with obstruction/lower urinary tract symptoms  Submassive bilateral pulmonary emboli Right heart strain Elevated troponin Acute hypoxic respiratory failure CT imaging survey confirms above Bilateral lower extremity Doppler negative for DVT Patient on 5 L Plan: Vascular surgery to perform pulmonary thrombectomy today 11/2 Follow-up 2D echocardiogram Wean oxygen as tolerated Heparin GTT  Bradycardia Essential hypertension Unclear nature of bradycardia Possibly secondary to clot burden Holding home nebivolol As needed IV hydralazine Telemetry monitoring  Dementia Mental status baseline Continue Aricept  BPH Continue finasteride Flomax     DVT prophylaxis: Heparin  GTT Code Status: DNR Family Communication: None today Disposition Plan: Status is: Inpatient  Remains inpatient appropriate because: Submassive pulmonary embolism.  Disposition plan pending.       Level of care: Progressive Cardiac  Consultants:  Vascular surgery  Procedures:  Pulmonary thrombectomy 11/2  Antimicrobials: None   Subjective: Seen and examined.  Reports shortness of breath.  No pain complaints.  Objective: Vitals:   04/21/21 1215 04/21/21 1230 04/21/21 1245 04/21/21 1301  BP: (!) 152/64 121/68 133/66 133/67  Pulse:  (!) 45 (!) 39   Resp: 19 20 15 17   Temp:      TempSrc:      SpO2: 91% 92% 93%   Weight:      Height:       No intake or output data in the 24 hours ending 04/21/21 1348 Filed Weights   04/20/21 1718  Weight: 108 kg    Examination:  General exam: No acute distress Respiratory system: Lungs clear.  Tachypneic.  Normal work of breathing.  5 L Cardiovascular system: Bradycardic, S1-S2, no murmurs, no pedal edema Gastrointestinal system: Soft, NT/ND, normal bowel sounds Central nervous system: Alert, oriented x2, no focal deficits Extremities: Symmetric 5 x 5 power. Skin: No rashes, lesions or ulcers Psychiatry: Judgement and insight appear impaired. Mood & affect flattened.     Data Reviewed: I have personally reviewed following labs and imaging studies  CBC: Recent Labs  Lab 04/20/21 1510 04/21/21 0606  WBC 11.5* 9.3  HGB 15.0 14.5  HCT 44.3 44.1  MCV 98.2 98.9  PLT 160 295*   Basic Metabolic Panel: Recent Labs  Lab 04/20/21 1510 04/21/21 0606  NA 140 139  K 4.8 4.5  CL 104 103  CO2 26 29  GLUCOSE 137* 108*  BUN 27* 24*  CREATININE 1.22 1.10  CALCIUM 8.8* 8.5*   GFR: Estimated Creatinine Clearance: 52.1 mL/min (by C-G formula based on SCr of 1.1 mg/dL). Liver Function Tests: Recent Labs  Lab 04/20/21 1510  AST 24  ALT 23  ALKPHOS 94  BILITOT 0.8  PROT 6.1*  ALBUMIN 3.1*   No results for input(s):  LIPASE, AMYLASE in the last 168 hours. No results for input(s): AMMONIA in the last 168 hours. Coagulation Profile: Recent Labs  Lab 04/20/21 1510  INR 1.1   Cardiac Enzymes: No results for input(s): CKTOTAL, CKMB, CKMBINDEX, TROPONINI in the last 168 hours. BNP (last 3 results) No results for input(s): PROBNP in the last 8760 hours. HbA1C: No results for input(s): HGBA1C in the last 72 hours. CBG: No results for input(s): GLUCAP in the last 168 hours. Lipid Profile: No results for input(s): CHOL, HDL, LDLCALC, TRIG, CHOLHDL, LDLDIRECT in the last 72 hours. Thyroid Function Tests: No results for input(s): TSH, T4TOTAL, FREET4, T3FREE, THYROIDAB in the last 72 hours. Anemia Panel: No results for input(s): VITAMINB12, FOLATE, FERRITIN, TIBC, IRON, RETICCTPCT in the last 72 hours. Sepsis Labs: No results for input(s): PROCALCITON, LATICACIDVEN in the last 168 hours.  Recent Results (from the past 240 hour(s))  Resp Panel by RT-PCR (Flu A&B, Covid) Nasopharyngeal Swab     Status: None   Collection Time: 04/20/21  3:30 PM   Specimen: Nasopharyngeal Swab; Nasopharyngeal(NP) swabs in vial transport medium  Result Value Ref Range Status   SARS Coronavirus 2 by RT PCR NEGATIVE NEGATIVE Final    Comment: (NOTE) SARS-CoV-2 target nucleic acids are NOT DETECTED.  The SARS-CoV-2 RNA is generally detectable in upper respiratory specimens during the acute phase of infection. The lowest concentration of SARS-CoV-2 viral copies this assay can detect is 138 copies/mL. A negative result does not preclude SARS-Cov-2 infection and should not be used as the sole basis for treatment or other patient management decisions. A negative result may occur with  improper specimen collection/handling, submission of specimen other than nasopharyngeal swab, presence of viral mutation(s) within the areas targeted by this assay, and inadequate number of viral copies(<138 copies/mL). A negative result must be  combined with clinical observations, patient history, and epidemiological information. The expected result is Negative.  Fact Sheet for Patients:  EntrepreneurPulse.com.au  Fact Sheet for Healthcare Providers:  IncredibleEmployment.be  This test is no t yet approved or cleared by the [place] FDA and  has been authorized for detection and/or diagnosis of SARS-CoV-2 by FDA under an Emergency Use Authorization (EUA). This EUA will remain  in effect (meaning this test can be used) for the duration of the COVID-19 declaration under Section 564(b)(1) of the Act, 21 U.S.C.section 360bbb-3(b)(1), unless the authorization is terminated  or revoked sooner.       Influenza A by PCR NEGATIVE NEGATIVE Final   Influenza B by PCR NEGATIVE NEGATIVE Final    Comment: (NOTE) The Xpert Xpress SARS-CoV-2/FLU/RSV plus assay is intended as an aid in the diagnosis of influenza from Nasopharyngeal swab specimens and should not be used as a sole basis for treatment. Nasal washings and aspirates are unacceptable for Xpert Xpress SARS-CoV-2/FLU/RSV testing.  Fact Sheet for Patients: EntrepreneurPulse.com.au  Fact Sheet for Healthcare Providers: IncredibleEmployment.be  This test is not yet approved or cleared by the [place] FDA and has been authorized for detection and/or diagnosis of SARS-CoV-2 by FDA under an Emergency Use Authorization (EUA). This EUA  will remain in effect (meaning this test can be used) for the duration of the COVID-19 declaration under Section 564(b)(1) of the Act, 21 U.S.C. section 360bbb-3(b)(1), unless the authorization is terminated or revoked.  Performed at [facility], [address] [place], [place] [address]          Radiology Studies: CT Angio Chest PE W and/or Wo Contrast  Result Date: 04/20/2021 CLINICAL DATA:  PE suspected, high prob EXAM: CT ANGIOGRAPHY CHEST  WITH CONTRAST TECHNIQUE: Multidetector CT imaging of the chest was performed using the standard protocol during bolus administration of intravenous contrast. Multiplanar CT image reconstructions and MIPs were obtained to evaluate the vascular anatomy. CONTRAST:  26mL OMNIPAQUE IOHEXOL 350 MG/ML SOLN COMPARISON:  Chest x-ray 04/20/2021, CT abdomen pelvis 08/05/2015 FINDINGS: Cardiovascular: Satisfactory opacification of the pulmonary arteries to the segmental level. Extensive right middle, upper, lower as well as left lower segmental and subsegmental pulmonary emboli. Subsegment left upper lobe pulmonary emboli also noted. Enlarged main pulmonary artery. Enlarged right to left ventricular ratio of 1.5. The heart is otherwise normal in caliber. No pericardial effusion. At least mild atherosclerotic plaque. 4-vessel coronary calcifications. Mediastinum/Nodes: No enlarged mediastinal, hilar, or axillary lymph nodes. Thyroid gland, trachea, and esophagus demonstrate no significant findings. Lungs/Pleura: No focal consolidation. No pulmonary nodule. No pulmonary mass. Trace left pleural effusion. No right pleural effusion. No pneumothorax. Upper Abdomen: Partially visualized fluid density lesions within the kidneys likely representing simple renal cysts. Colonic diverticulosis. Otherwise no acute abnormality. Musculoskeletal: No chest wall abnormality. No suspicious lytic or blastic osseous lesions. No acute displaced fracture. Multilevel degenerative changes of the spine. Review of the MIP images confirms the above findings. IMPRESSION: 1. Right middle, upper, lower as well as left lower segmental and subsegmental pulmonary emboli. Subsegmental left upper lobe pulmonary emboli. Associated right heart strain. No pulmonary infarction. 2. Aortic Atherosclerosis (ICD10-I70.0) including four-vessel coronary calcification. These results were called by telephone at the time of interpretation on 04/20/2021 at 4:29 pm to provider  Merlyn Lot , who verbally acknowledged these results. Electronically Signed   By: Iven Finn M.D.   On: 04/20/2021 16:37   US Venous Img Lower Bilateral (DVT)  Result Date: 04/20/2021 CLINICAL DATA:  PE seen on a CTA of the chest today. Assess for venous thrombosis. EXAM: BILATERAL LOWER EXTREMITY VENOUS DOPPLER ULTRASOUND TECHNIQUE: Gray-scale sonography with compression, as well as color and duplex ultrasound, were performed to evaluate the deep venous system(s) from the level of the common femoral vein through the popliteal and proximal calf veins. COMPARISON:  None. FINDINGS: VENOUS Normal compressibility of the common femoral, superficial femoral, and popliteal veins, as well as the visualized calf veins. Visualized portions of profunda femoral vein and great saphenous vein unremarkable. No filling defects to suggest DVT on grayscale or color Doppler imaging. Doppler waveforms show normal direction of venous flow, normal respiratory plasticity and response to augmentation. Assessment of the peroneal vein the left with limited. OTHER None. Limitations: none IMPRESSION: No evidence of a deep venous thrombosis of either lower extremity. Electronically Signed   By: Lajean Manes M.D.   On: 04/20/2021 18:36   DG Chest Port 1 View  Result Date: 04/20/2021 CLINICAL DATA:  Shortness of breath. EXAM: PORTABLE CHEST 1 VIEW COMPARISON:  07/29/2015 FINDINGS: Stable cardiomediastinal contours. Aortic atherosclerosis. Decreased lung volumes. Blunting of the left costophrenic angle is noted suggestive of small left effusion. No interstitial edema or airspace disease. Degenerative changes noted within the thoracic spine. IMPRESSION: Suspect small left  pleural effusion. Electronically Signed   By: Kerby Moors M.D.   On: 04/20/2021 15:42   ECHOCARDIOGRAM COMPLETE  Result Date: 04/21/2021    ECHOCARDIOGRAM REPORT   Patient Name:   Christopher Jenkins Date of Exam: 04/20/2021 Medical Rec #:  [id number]        Height:       68.0 in Accession #:    [id number]      Weight:       238.0 lb Date of Birth:  [birth year]      BSA:          2.201 m Patient Age:    62 years        BP:           144/75 mmHg Patient Gender: M               HR:           53 bpm. Exam Location:  [facility] Procedure: 2D Echo, Cardiac Doppler, Color Doppler and Intracardiac            Opacification Agent Indications:     I26.09 Pulmonary embolus  History:         Patient has prior history of Echocardiogram examinations, most                  recent 04/10/2015. Risk Factors:Hypertension, Dyslipidemia and                  Obesity.  Sonographer:     Cresenciano Lick RDCS Referring Phys:  [id number] AGBATA Diagnosing Phys: Nelva Bush MD IMPRESSIONS  1. Left ventricular ejection fraction, by estimation, is 45 to 50%. The left ventricle has mildly decreased function. Left ventricular endocardial border not optimally defined to evaluate regional wall motion. There is mild left ventricular hypertrophy.  Left ventricular diastolic parameters are indeterminate.  2. Right ventricular systolic function is mildly reduced. The right ventricular size is mildly enlarged.  3. Left atrial size was mildly dilated.  4. Right atrial size was mildly dilated.  5. The mitral valve is abnormal. Trivial mitral valve regurgitation. No evidence of mitral stenosis.  6. The aortic valve is tricuspid. There is mild calcification of the aortic valve. There is moderate thickening of the aortic valve. Aortic valve regurgitation is trivial. Mild to moderate aortic valve sclerosis/calcification is present, without any evidence of aortic stenosis.  7. Aortic dilatation noted. There is mild dilatation of the aortic root, measuring 41 mm. There is borderline dilatation of the ascending aorta, measuring 35 mm.  8. The inferior vena cava is normal in size with greater than 50% respiratory variability, suggesting right atrial pressure of 3 mmHg. FINDINGS  Left Ventricle: Left  ventricular ejection fraction, by estimation, is 45 to 50%. The left ventricle has mildly decreased function. Left ventricular endocardial border not optimally defined to evaluate regional wall motion. Definity contrast agent was given IV to delineate the left ventricular endocardial borders. The left ventricular internal cavity size was normal in size. There is mild left ventricular hypertrophy. Left ventricular diastolic parameters are indeterminate. Right Ventricle: The right ventricular size is mildly enlarged. No increase in right ventricular wall thickness. Right ventricular systolic function is mildly reduced. Left Atrium: Left atrial size was mildly dilated. Right Atrium: Right atrial size was mildly dilated. Pericardium: There is no evidence of pericardial effusion. Mitral Valve: The mitral valve is abnormal. There is mild thickening of the mitral valve leaflet(s). Trivial mitral valve regurgitation. No evidence of mitral  valve stenosis. Tricuspid Valve: The tricuspid valve is normal in structure. Tricuspid valve regurgitation is mild. Aortic Valve: The aortic valve is tricuspid. There is mild calcification of the aortic valve. There is moderate thickening of the aortic valve. Aortic valve regurgitation is trivial. Mild to moderate aortic valve sclerosis/calcification is present, without any evidence of aortic stenosis. Pulmonic Valve: The pulmonic valve was not well visualized. Pulmonic valve regurgitation is mild. No evidence of pulmonic stenosis. Aorta: Aortic dilatation noted. There is mild dilatation of the aortic root, measuring 41 mm. There is borderline dilatation of the ascending aorta, measuring 35 mm. Pulmonary Artery: The pulmonary artery is not well seen. Venous: The inferior vena cava is normal in size with greater than 50% respiratory variability, suggesting right atrial pressure of 3 mmHg. IAS/Shunts: The interatrial septum was not well visualized.  LEFT VENTRICLE PLAX 2D LVIDd:         4.44  cm   Diastology LVIDs:         2.78 cm   LV e' medial:    6.96 cm/s LV PW:         1.29 cm   LV E/e' medial:  10.7 LV IVS:        1.25 cm   LV e' lateral:   7.40 cm/s LVOT diam:     2.30 cm   LV E/e' lateral: 10.1 LV SV:         100 LV SV Index:   45 LVOT Area:     4.15 cm  RIGHT VENTRICLE             IVC RV Basal diam:  4.66 cm     IVC diam: 1.99 cm RV S prime:     19.00 cm/s TAPSE (M-mode): 3.7 cm LEFT ATRIUM             Index        RIGHT ATRIUM           Index LA diam:        5.10 cm 2.32 cm/m   RA Area:     23.30 cm LA Vol (A2C):   88.9 ml 40.40 ml/m  RA Volume:   81.60 ml  37.08 ml/m LA Vol (A4C):   62.1 ml 28.22 ml/m LA Biplane Vol: 75.6 ml 34.35 ml/m  AORTIC VALVE LVOT Vmax:   121.50 cm/s LVOT Vmean:  81.600 cm/s LVOT VTI:    0.240 m  AORTA Ao Root diam: 4.10 cm Ao Asc diam:  3.50 cm MITRAL VALVE MV Area (PHT): 3.77 cm     SHUNTS MV Decel Time: 201 msec     Systemic VTI:  0.24 m MV E velocity: 74.60 cm/s   Systemic Diam: 2.30 cm MV A velocity: 109.00 cm/s MV E/A ratio:  0.68 Christopher End MD Electronically signed by Nelva Bush MD Signature Date/Time: 04/21/2021/8:29:27 AM    Final         Scheduled Meds:  docusate sodium  200 mg Oral QHS   donepezil  5 mg Oral QHS   fentaNYL       finasteride  5 mg Oral Daily   furosemide  40 mg Oral q morning   heparin sodium (porcine)       midazolam       multivitamin with minerals  1 tablet Oral Daily   nebivolol  2.5 mg Oral Daily   potassium chloride SA  20 mEq Oral Daily   sodium chloride flush  3 mL Intravenous Q12H  tamsulosin  0.4 mg Oral Daily   Continuous Infusions:  sodium chloride     heparin 1,600 Units/hr (04/21/21 1239)     LOS: 1 day    Time spent: 35 minutes    Sidney Ace, MD [facility]   If 7PM-7AM, please contact night-coverage  04/21/2021, 1:48 PM

## 2021-04-21 NOTE — ED Notes (Signed)
Report to Bryson Ha at [facility]

## 2021-04-21 NOTE — ED Notes (Signed)
Elsie Amis RN aware of assigned bed

## 2021-04-21 NOTE — Consult Note (Signed)
ANTICOAGULATION CONSULT NOTE   Pharmacy Consult for Heparin Infusion Indication: pulmonary embolus  Allergies  Allergen Reactions   No Known Allergies     Patient Measurements: Height: 5\' 8"  (172.7 cm) Weight: 108 kg (238 lb) IBW/kg (Calculated) : 68.4 Heparin Dosing Weight: 92.2 kg  Vital Signs: Temp: 98.3 F (36.8 C) (11/01 1510) Temp Source: Oral (11/01 1510) BP: 112/62 (11/02 0130) Pulse Rate: 49 (11/02 0130)  Labs: Recent Labs    04/20/21 1510 04/20/21 1741 04/21/21 0220  HGB 15.0  --   --   HCT 44.3  --   --   PLT 160  --   --   APTT 28  --   --   LABPROT 13.8  --   --   INR 1.1  --   --   HEPARINUNFRC  --   --  0.70  CREATININE 1.22  --   --   TROPONINIHS 109* 115*  --      Estimated Creatinine Clearance: 47 mL/min (by C-G formula based on SCr of 1.22 mg/dL).   Medical History: Past Medical History:  Diagnosis Date   BPH (benign prostatic hyperplasia)    Duodenal mass    Elevated PSA    Gross hematuria    Hyperlipidemia    Hypertension    Hypertrophy (benign) of prostate    asymptomatic on finasteride. will f/up with urologist [name]   Kidney lesion 07/2015   pt has appt at kidney specialist 08/05/15 to review CT scan.   Obesity    Overweight    Tubular adenoma of colon    Urinary retention     Medications:  No history of chronic anticoagulant use PTA.  Assessment: Pharmacy has been consulted to initiate heparin infusion on 87yo patient admitted with bradycardia and hypoxia. Upon review of CTA, has bilateral acute appearing PE. Baseline labs have been ordered and are pending.  Goal of Therapy:  Heparin level 0.3-0.7 units/ml Monitor platelets by anticoagulation protocol: Yes  11/02 0220 HL 0.70, borderline supratherapeutic   Plan:  Continue heparin infusion at 1600 units/hr Recheck HL in 6 hrs, then daily if lvl remains therapeutic Continue to monitor H&H and platelets  Renda Rolls, PharmD, [facility] 04/21/2021 3:09 AM

## 2021-04-21 NOTE — Op Note (Signed)
[facility]  Percutaneous Study/Intervention Procedural Note   Date of Surgery: 04/21/2021,12:03 PM  Surgeon: Leotis Pain  Pre-operative Diagnosis: Symptomatic bilateral pulmonary emboli  Post-operative diagnosis:  Same  Procedure(s) Performed:  1.  Contrast injection right heart  2.  Thrombolysis with 8 mg of tPA to the pulmonary arteries, 5 mg on the right and 3 mg on the left pulmonary arteries  3.  Mechanical thrombectomy using the penumbra CAT 8 device to the left lower lobe pulmonary artery as well as the right lower lobe, middle lobe, and upper lobe pulmonary arteries  4.  Selective catheter placement right lower lobe, middle lobe, and upper lobe pulmonary arteries  5.  Selective catheter placement left lower lobe pulmonary artery    Anesthesia: Conscious sedation was administered under my direct supervision by the interventional radiology RN. IV Versed plus fentanyl were utilized. Continuous ECG, pulse oximetry and blood pressure was monitored throughout the entire procedure.  Versed and fentanyl were administered intravenously.  Conscious sedation was administered for a total of 29 minutes using 1.5 of Versed and 62.5 mcg of Fentanyl.  EBL: 300 cc  Sheath: 9 French right femoral vein  Contrast: 40 cc   Fluoroscopy Time: 6.2 minutes  Indications:  Patient presents with pulmonary emboli. The patient is symptomatic with hypoxemia and dyspnea on exertion.  There is evidence of right heart strain on the CT angiogram. The patient is otherwise a good candidate for intervention and even the long-term benefits pulmonary angiography with thrombolysis is offered. The risks and benefits are reviewed long-term benefits are discussed. All questions are answered patient agrees to proceed.  Procedure:  Christopher Jenkins a 87 y.o. male who was identified and appropriate procedural time out was performed.  The patient was then placed supine on the table and prepped and  draped in the usual sterile fashion.  Ultrasound was used to evaluate the right common femoral vein.  It was patent, as it was echolucent and compressible.  A digital ultrasound image was acquired for the permanent record.  A Seldinger needle was used to access the right common femoral vein under direct ultrasound guidance.  A 0.035 J wire was advanced without resistance and a 5Fr sheath was placed and then upsized to an 9 [place] sheath.    The wire and pigtail catheter were then negotiated into the right atrium and bolus injection of contrast was utilized to demonstrate the right ventricle and the pulmonary artery outflow. The wire and catheter were then negotiated into the left main and then the left lower lobe pulmonary artery where hand injection of contrast was utilized to demonstrate the pulmonary arteries and confirm the locations of the pulmonary emboli.  There was significant thrombus burden in the left lower lobe pulmonary artery.  Then, using the JR4 catheter and the advantage wire I switched over to the right side initially cannulate the right lower lobe pulmonary artery where selective imaging showed significant thrombus burden.  I then cannulated the right middle and right upper lobes where selective imaging and again showed significant thrombus burden.   TPA was reconstituted and delivered onto the table. A total of 8 milligrams of TPA was utilized.  3 mg was administered on the left side and 5 mg was administered on the right side. This was then allowed to dwell.  The Penumbra Cat 8 catheter was then advanced up into the pulmonary vasculature. The left lung was addressed first. Catheter was negotiated into the left lower lobe pulmonary  artery and mechanical thrombectomy was performed.  Passes were made with the separator.  Follow-up imaging demonstrated a good result and therefore we turned our attention to the other side.  The Penumbra Cat 8 catheter was then negotiated to the opposite  side. The right lung was then addressed. Catheter was negotiated into the right lower lobe pulmonary artery and mechanical thrombectomy was performed.  Passes were made with the penumbra CAT 8 catheter and the separator.  Follow-up imaging demonstrated a good result and therefore the catheter was renegotiated into the right middle lobe pulmonary artery and passes were made with mechanical thrombectomy in the right middle lobe pulmonary artery using the penumbra CAT 8 catheter and the separator.  I then cannulated the right upper lobe pulmonary artery and again mechanical thrombectomy was performed. Passes were made with both the Penumbra catheter itself as well as introducing the separator. Follow-up imaging was then performed.  Significant reduction in thrombus burden was seen on the right side as well so I elected to terminate the procedure.  After review these images wires were reintroduced and the catheters removed. Then, the sheath is then pulled and pressures held. A safeguard is placed.    Findings:   Right heart imaging:  Right atrium and right ventricle and the pulmonary outflow tract appears quite dilated  Right lung: Significant thrombus in the right lower lobe, right middle lobe, and right upper lobe pulmonary artery  Left lung: Left lower lobe pulmonary artery showed significant thrombus burden    Disposition: Patient was taken to the recovery room in stable condition having tolerated the procedure well.  Leotis Pain 04/21/2021,12:03 PM

## 2021-04-21 NOTE — ED Notes (Signed)
Pt resting comfortably at this time. Normal rise and fall of chest. NAD noted. Call bell in reach.

## 2021-04-21 NOTE — OR Nursing (Signed)
Pt heparin off for approximately 15 minutes, pre thrombectomy. Thrombectomy to be done later today.  Restarted. Pharmacy notified. Lab called to draw heparin level in specials recovery

## 2021-04-21 NOTE — ED Notes (Signed)
Pt taken to bathroom via w/c to have BM.

## 2021-04-21 NOTE — Consult Note (Signed)
ANTICOAGULATION CONSULT NOTE   Pharmacy Consult for Heparin Infusion Indication: pulmonary embolus  Allergies  Allergen Reactions   No Known Allergies     Patient Measurements: Height: 5\' 8"  (172.7 cm) Weight: 108 kg (238 lb) IBW/kg (Calculated) : 68.4 Heparin Dosing Weight: 92.2 kg  Vital Signs: Temp: 97.6 F (36.4 C) (11/02 0855) Temp Source: Oral (11/02 0855) BP: 189/79 (11/02 0855) Pulse Rate: 44 (11/02 0700)  Labs: Recent Labs    04/20/21 1510 04/20/21 1741 04/21/21 0220 04/21/21 0606 04/21/21 0917  HGB 15.0  --   --  14.5  --   HCT 44.3  --   --  44.1  --   PLT 160  --   --  149*  --   APTT 28  --   --   --   --   LABPROT 13.8  --   --   --   --   INR 1.1  --   --   --   --   HEPARINUNFRC  --   --  0.70  --  0.50  CREATININE 1.22  --   --  1.10  --   TROPONINIHS 109* 115*  --   --   --      Estimated Creatinine Clearance: 52.1 mL/min (by C-G formula based on SCr of 1.1 mg/dL).   Medical History: Past Medical History:  Diagnosis Date   BPH (benign prostatic hyperplasia)    Duodenal mass    Elevated PSA    Gross hematuria    Hyperlipidemia    Hypertension    Hypertrophy (benign) of prostate    asymptomatic on finasteride. will f/up with urologist [name]   Kidney lesion 07/2015   pt has appt at kidney specialist 08/05/15 to review CT scan.   Obesity    Overweight    Tubular adenoma of colon    Urinary retention     Medications:  No history of chronic anticoagulant use PTA.  Assessment: Pharmacy has been consulted to initiate heparin infusion on 87yo patient admitted with bradycardia and hypoxia. Upon review of CTA, has bilateral acute appearing PE. Baseline labs have been ordered and are pending.  11/02 0220 HL 0.70, borderline supratherapeutic 11/02 0917 HL 0.50, therapeutic (per nurse, pt unable to get thrombectomy 11/2 and stated she paused the heparin drip for ~15 min)  Goal of Therapy:  Heparin level 0.3-0.7 units/ml Monitor  platelets by anticoagulation protocol: Yes   Plan:  HL therapeutic. Continue heparin infusion at 1600 units/hr Recheck HL with AM labs Continue to monitor H&H and platelets  Sherilyn Banker, PharmD Clinical Pharmacist 04/21/2021 9:47 AM

## 2021-04-21 NOTE — Progress Notes (Signed)
Attempted to contact his wife at the number provided but got no answer and her mailbox was not excepting messages.

## 2021-04-21 NOTE — OR Nursing (Signed)
Heparin infusion was stopped for thrombectomy. Resumed per dr dew instruction post procedure.

## 2021-04-22 ENCOUNTER — Inpatient Hospital Stay: Payer: Medicare Other

## 2021-04-22 DIAGNOSIS — I2699 Other pulmonary embolism without acute cor pulmonale: Secondary | ICD-10-CM

## 2021-04-22 DIAGNOSIS — I10 Essential (primary) hypertension: Secondary | ICD-10-CM | POA: Diagnosis not present

## 2021-04-22 DIAGNOSIS — R001 Bradycardia, unspecified: Secondary | ICD-10-CM | POA: Diagnosis not present

## 2021-04-22 DIAGNOSIS — N400 Enlarged prostate without lower urinary tract symptoms: Secondary | ICD-10-CM | POA: Diagnosis not present

## 2021-04-22 DIAGNOSIS — R609 Edema, unspecified: Secondary | ICD-10-CM | POA: Diagnosis not present

## 2021-04-22 DIAGNOSIS — G301 Alzheimer's disease with late onset: Secondary | ICD-10-CM | POA: Diagnosis not present

## 2021-04-22 LAB — CBC
HCT: 43.1 % (ref 39.0–52.0)
Hemoglobin: 13.7 g/dL (ref 13.0–17.0)
MCH: 31.7 pg (ref 26.0–34.0)
MCHC: 31.8 g/dL (ref 30.0–36.0)
MCV: 99.8 fL (ref 80.0–100.0)
Platelets: 152 10*3/uL (ref 150–400)
RBC: 4.32 MIL/uL (ref 4.22–5.81)
RDW: 12.9 % (ref 11.5–15.5)
WBC: 9.5 10*3/uL (ref 4.0–10.5)
nRBC: 0 % (ref 0.0–0.2)

## 2021-04-22 LAB — HEPARIN LEVEL (UNFRACTIONATED): Heparin Unfractionated: 0.55 IU/mL (ref 0.30–0.70)

## 2021-04-22 MED ORDER — APIXABAN 5 MG PO TABS: 5.0000 mg | ORAL_TABLET | Freq: Two times a day (BID) | ORAL | Status: DC

## 2021-04-22 MED ORDER — APIXABAN 5 MG PO TABS
10.0000 mg | ORAL_TABLET | Freq: Two times a day (BID) | ORAL | Status: DC
  Administered 2021-04-22 – 2021-04-23 (×3): 10 mg via ORAL
  Filled 2021-04-22 (×3): qty 2

## 2021-04-22 NOTE — Progress Notes (Signed)
[facility] Daily Progress Note  04/21/21:             1.  Contrast injection right heart             2.  Thrombolysis with 8 mg of tPA to the pulmonary arteries, 5 mg on the right and 3 mg on the left pulmonary arteries             3.  Mechanical thrombectomy using the penumbra CAT 8 device to the left lower lobe pulmonary artery as well as the right lower lobe, middle lobe, and upper lobe pulmonary arteries             4.  Selective catheter placement right lower lobe, middle lobe, and upper lobe pulmonary arteries             5.  Selective catheter placement left lower lobe pulmonary artery  Subjective: Patient without complaint this AM.  Denies any worsening symptoms.  No acute issues overnight.  Reports minimal improvement in breathing.  Objective: Vitals:   04/21/21 1947 04/22/21 0042 04/22/21 0414 04/22/21 0810  BP: 138/74 136/71 (!) 146/72 (!) 142/70  Pulse: (!) 56 (!) 51 (!) 51 (!) 46  Resp: 20 20 20 17   Temp: 99.4 F (37.4 C) 98 F (36.7 C) 98 F (36.7 C) 97.9 F (36.6 C)  TempSrc:      SpO2: 94% 99% 100% 97%  Weight:      Height:        Intake/Output Summary (Last 24 hours) at 04/22/2021 1002 Last data filed at 04/22/2021 0400 Gross per 24 hour  Intake 588.22 ml  Output 200 ml  Net 388.22 ml   Physical Exam: A&Ox3, NAD CV: RRR Pulmonary: Decreased Bilaterally Abdomen: Soft, Nontender, Nondistended Right groin:  Access site.  PAD removed.  Stab incision clean dry and intact.  Some surrounding ecchymosis no drainage. Vascular: Bilateral lower extremity warm distally to toes   Laboratory: CBC    Component Value Date/Time   WBC 9.5 04/22/2021 0612   HGB 13.7 04/22/2021 0612   HCT 43.1 04/22/2021 0612   PLT 152 04/22/2021 0612   BMET    Component Value Date/Time   NA 139 04/21/2021 0606   NA 140 05/08/2014 0000   K 4.5 04/21/2021 0606   CL 103 04/21/2021 0606   CO2 29 04/21/2021 0606   GLUCOSE 108 (H) 04/21/2021 0606   BUN 24 (H)  04/21/2021 0606   BUN 24 04/30/2015 1401   CREATININE 1.10 04/21/2021 0606   CREATININE 0.94 04/17/2020 1617   CALCIUM 8.5 (L) 04/21/2021 0606   GFRNONAA >60 04/21/2021 0606   GFRNONAA 75 12/16/2011 0842   GFRAA >60 08/15/2015 0418   GFRAA 87 12/16/2011 0842   Assessment/Planning: The patient is a 87 year old male who presented with symptomatic bilateral pulmonary emboli status post pulmonary thrombolysis / thrombectomy - POD#1  1) successful pulmonary thrombectomy / thrombolysis in the setting of acute PE. 2) patient reports minimal improvement in his breathing.  Vitals are stable and what seems like his baseline.  Currently he is using 5 L nasal cannula for normal O2 sats. 3) no DVT seen on duplex. 4) no further recommendations of vascular surgery at this time.  Okay from our perspective to transition to PO anticoagulation if the patient is medically stable. 5) okay to be out of bed and ambulating.  Discussed with Dr. Ellis Parents Lorenda Grecco PA-C 04/22/2021 10:02 AM

## 2021-04-22 NOTE — Evaluation (Signed)
Physical Therapy Evaluation Patient Details Name: Christopher Jenkins MRN: [id number] DOB: Sep 13, 1926 Today's Date: 04/22/2021  History of Present Illness  Pt is a 87 y.o. male presenting to hospital 11/1 with reported low HR (in 30's) and hypoxic in the 70's.  Pt admitted with acute B PE with hypoxia and evidence of R heart strain.  S/p pulmonary thrombectomy 04/21/21.  PMH includes BPH, duodenal mass, HLD, htn, hypertrophy of prostate, urinary retention, R knee sx, R THA.  Clinical Impression  Prior to hospital admission, pt was independent with ambulation; lives with his [facility] at [facility].  Currently pt is CGA with transfers and ambulation 200 feet (no AD use) on 3 L O2 via nasal cannula (O2 sats 92% or greater during sessions activities; mild SOB noted with activity).  After stopping (from walking), pt with 2 separate episodes of taking a couple steps backwards suddenly but pt able to self correct and maintain balance (CGA provided for safety).  Pt would benefit from skilled PT to address noted impairments and functional limitations (see below for any additional details).  Upon hospital discharge, pt would benefit from HHPT for higher level balance.    Recommendations for follow up therapy are one component of a multi-disciplinary discharge planning process, led by the attending physician.  Recommendations may be updated based on patient status, additional functional criteria and insurance authorization.  Follow Up Recommendations Home health PT    Assistance Recommended at Discharge Other (comment) (Initial SBA with ambulation)  Functional Status Assessment Patient has had a recent decline in their functional status and demonstrates the ability to make significant improvements in function in a reasonable and predictable amount of time.  Equipment Recommendations  None recommended by PT    Recommendations for Other Services OT consult     Precautions / Restrictions  Precautions Precautions: Fall Restrictions Weight Bearing Restrictions: No      Mobility  Bed Mobility               General bed mobility comments: Deferred (pt in recliner beginning/end of session)    Transfers Overall transfer level: Needs assistance Equipment used: None Transfers: Sit to/from Stand Sit to Stand: Min guard           General transfer comment: fairly strong stand from recliner    Ambulation/Gait Ambulation/Gait assistance: Min guard (chair follow) Gait Distance (Feet): 200 Feet Assistive device: None Gait Pattern/deviations: Step-through pattern Gait velocity: mildly decreased   General Gait Details: wider BOS with mild increased B lateral sway  Stairs            Wheelchair Mobility    Modified Rankin (Stroke Patients Only)       Balance Overall balance assessment: Needs assistance Sitting-balance support: No upper extremity supported;Feet supported Sitting balance-Leahy Scale: Good Sitting balance - Comments: steady sitting reaching within BOS   Standing balance support: No upper extremity supported;During functional activity Standing balance-Leahy Scale: Good Standing balance comment: after stopping (from walking), pt with 2 episodes of taking a couple steps backwards suddenly but pt able to self correct and maintain balance                             Pertinent Vitals/Pain Pain Assessment: No/denies pain HR 50-69 bpm during sessions activities.    Home Living Family/patient expects to be discharged to:: Assisted living  Additional Comments: [facility]; assist with bathing and meals; uses O2 at night    Prior Function Prior Level of Function : Independent/Modified Independent             Mobility Comments: Pt reports no recent falls in last 6 months.       Hand Dominance        Extremity/Trunk Assessment   Upper Extremity Assessment Upper Extremity Assessment: Defer  to OT evaluation    Lower Extremity Assessment Lower Extremity Assessment: Generalized weakness    Cervical / Trunk Assessment Cervical / Trunk Assessment: Normal  Communication   Communication: HOH  Cognition Arousal/Alertness: Awake/alert Behavior During Therapy: WFL for tasks assessed/performed Overall Cognitive Status: Within Functional Limits for tasks assessed                                          General Comments  Pt finishing with OT evaluation upon PT arrival.  Pt agreeable to PT session.  MD Priscella Mann cleared pt to participate in therapy (in regards to PE concerns and anticoagulation).    Exercises     Assessment/Plan    PT Assessment Patient needs continued PT services  PT Problem List Decreased strength;Decreased balance;Decreased activity tolerance;Decreased mobility       PT Treatment Interventions DME instruction;Gait training;Functional mobility training;Therapeutic activities;Therapeutic exercise;Balance training;Patient/family education    PT Goals (Current goals can be found in the Care Plan section)  Acute Rehab PT Goals Patient Stated Goal: to go home PT Goal Formulation: With patient Time For Goal Achievement: 05/06/21 Potential to Achieve Goals: Good    Frequency Min 2X/week   Barriers to discharge        Co-evaluation               AM-PAC PT "6 Clicks" Mobility  Outcome Measure Help needed turning from your back to your side while in a flat bed without using bedrails?: None Help needed moving from lying on your back to sitting on the side of a flat bed without using bedrails?: A Little Help needed moving to and from a bed to a chair (including a wheelchair)?: A Little Help needed standing up from a chair using your arms (e.g., wheelchair or bedside chair)?: A Little Help needed to walk in hospital room?: A Little Help needed climbing 3-5 steps with a railing? : A Little 6 Click Score: 19    End of Session  Equipment Utilized During Treatment: Gait belt;Oxygen (3 L O2 via nasal cannula) Activity Tolerance: Patient tolerated treatment well Patient left: in chair;with call bell/phone within reach;with chair alarm set Nurse Communication: Mobility status;Precautions;Other (comment) (O2 sats during session) PT Visit Diagnosis: Unsteadiness on feet (R26.81);Muscle weakness (generalized) (M62.81)    Time: 0947-0962 PT Time Calculation (min) (ACUTE ONLY): 23 min   Charges:   PT Evaluation $PT Eval Low Complexity: 1 Low PT Treatments $Therapeutic Activity: 8-22 mins       Leitha Bleak, PT 04/22/21, 12:14 PM

## 2021-04-22 NOTE — Evaluation (Signed)
Occupational Therapy Evaluation Patient Details Name: Christopher Jenkins MRN: [id number] DOB: 1927/06/12 Today's Date: 04/22/2021   History of Present Illness Pt is a 87 y.o. male presenting to hospital 11/1 with reported low HR (in 30's) and hypoxic in the 70's.  Pt admitted with acute B PE with hypoxia and evidence of R heart strain.  S/p pulmonary thrombectomy 04/21/21.  PMH includes BPH, duodenal mass, HLD, htn, hypertrophy of prostate, urinary retention, R knee sx, R THA.   Clinical Impression   Mr Ravert was seen for OT evaluation this date. Prior to hospital admission, pt was resident at [facility], reports assist for bathing 2x/week and PRN assist for dressing. Pt lives with his wife who also receives assistance. Pt presents to acute OT demonstrating impaired ADL performance and functional mobility 2/2 decreased activity tolerance and functional strength/balance deficits. Pt currently requires SBA for LB access seated EOB - cues for PLB as pt desats mid 80s on RA, resolved with 3L O2 to mid 90s in sitting. CGA for toilet t/f - cues for safety awareness.  Pt would benefit from skilled OT to address noted impairments and functional limitations (see below for any additional details) in order to maximize safety and independence while minimizing falls risk and caregiver burden. Upon hospital discharge, recommend HHOT to maximize pt safety and return to functional independence during meaningful occupations of daily life.       Recommendations for follow up therapy are one component of a multi-disciplinary discharge planning process, led by the attending physician.  Recommendations may be updated based on patient status, additional functional criteria and insurance authorization.   Follow Up Recommendations  Home health OT    Assistance Recommended at Discharge Set up Supervision/Assistance  Functional Status Assessment  Patient has had a recent decline in their functional status and  demonstrates the ability to make significant improvements in function in a reasonable and predictable amount of time.  Equipment Recommendations  None recommended by OT    Recommendations for Other Services       Precautions / Restrictions Precautions Precautions: Fall Restrictions Weight Bearing Restrictions: No      Mobility Bed Mobility Overal bed mobility: Needs Assistance Bed Mobility: Supine to Sit     Supine to sit: Min guard;HOB elevated     General bed mobility comments: Deferred (pt in recliner beginning/end of session)    Transfers Overall transfer level: Needs assistance Equipment used: None Transfers: Sit to/from Stand Sit to Stand: Min guard           General transfer comment: fairly strong stand from recliner      Balance Overall balance assessment: Needs assistance Sitting-balance support: No upper extremity supported;Feet supported Sitting balance-Leahy Scale: Good Sitting balance - Comments: steady sitting reaching within BOS   Standing balance support: No upper extremity supported;During functional activity Standing balance-Leahy Scale: Good Standing balance comment: difficulty with weight shifting                           ADL either performed or assessed with clinical judgement   ADL Overall ADL's : Needs assistance/impaired                                       General ADL Comments: SBA for LB access seated EOB - cues for PLB as pt desats mid 80s on RA, resolved with  3L O2 to mid 90s. CGA for toilet t/f - cues for safety awareness.      Pertinent Vitals/Pain Pain Assessment: No/denies pain     Hand Dominance Right   Extremity/Trunk Assessment Upper Extremity Assessment Upper Extremity Assessment: Generalized weakness   Lower Extremity Assessment Lower Extremity Assessment: Generalized weakness   Cervical / Trunk Assessment Cervical / Trunk Assessment: Normal   Communication  Communication Communication: HOH   Cognition Arousal/Alertness: Awake/alert Behavior During Therapy: WFL for tasks assessed/performed Overall Cognitive Status: Within Functional Limits for tasks assessed                                       General Comments  SpO2 95% on 3L [place] at rest, maintained mid 90s on RA at rest, desat 86% with mobility on RA, resolved on 3L [place].    Exercises Exercises: Other exercises Other Exercises Other Exercises: Pt educated re; OT role, DME recs, d/c recs, falls prevention Other Exercises: LBD, sup>sit, sit<>stand, sitting/standing balance/tolerance   Shoulder Instructions      Home Living Family/patient expects to be discharged to:: Assisted living                                 Additional Comments: [facility]; assist with bathing and meals; uses O2 at night      Prior Functioning/Environment Prior Level of Function : Independent/Modified Independent             Mobility Comments: Pt reports no recent falls in last 6 months.          OT Problem List: Decreased strength;Decreased activity tolerance;Impaired balance (sitting and/or standing);Decreased safety awareness      OT Treatment/Interventions: Self-care/ADL training;Therapeutic exercise;Energy conservation;DME and/or AE instruction;Therapeutic activities;Patient/family education;Balance training    OT Goals(Current goals can be found in the care plan section) Acute Rehab OT Goals Patient Stated Goal: to go home OT Goal Formulation: With patient Time For Goal Achievement: 05/06/21 Potential to Achieve Goals: Good ADL Goals Pt Will Perform Grooming: Independently;standing (will tolerate >10 mins standing grooming tasks) Pt Will Perform Lower Body Dressing: Independently;sit to/from stand Pt Will Transfer to Toilet: Independently;ambulating;regular height toilet  OT Frequency: Min 2X/week    AM-PAC OT "6 Clicks" Daily Activity     Outcome  Measure Help from another person eating meals?: None Help from another person taking care of personal grooming?: A Little Help from another person toileting, which includes using toliet, bedpan, or urinal?: A Little Help from another person bathing (including washing, rinsing, drying)?: A Little Help from another person to put on and taking off regular upper body clothing?: None Help from another person to put on and taking off regular lower body clothing?: A Little 6 Click Score: 20   End of Session Nurse Communication: Mobility status  Activity Tolerance: Patient tolerated treatment well Patient left: in chair;with call bell/phone within reach;Other (comment) (PT in room)  OT Visit Diagnosis: Other abnormalities of gait and mobility (R26.89);Muscle weakness (generalized) (M62.81)                Time: 9983-3825 OT Time Calculation (min): 13 min Charges:  OT General Charges $OT Visit: 1 Visit OT Evaluation $OT Eval Low Complexity: 1 Low  Dessie Coma, M.S. OTR/L  04/22/21, 2:51 PM  ascom [phone]

## 2021-04-22 NOTE — Consult Note (Signed)
Pt counseled on apixaban. Coupon and handout was given to the patient.   Eleonore Chiquito, PharmD, BCPS

## 2021-04-22 NOTE — Progress Notes (Signed)
PROGRESS NOTE    Christopher Jenkins  JQB:[id number] DOB: 1927/01/11 DOA: 04/20/2021 PCP: [name], MD    Brief Narrative:  87 y.o. male with medical history significant for hypertension, BPH, obesity who presents to the emergency room via EMS for evaluation of feeling unwell.  The assisted facility staff called EMS because patient was found to have a low heart rate between 39 and 48 and room air pulse oximetry between 78 and 85%.  Patient denies having any chest pain or shortness of breath.  He denies having abdominal pain, no nausea, no vomiting, no dizziness, no lightheadedness, no leg swelling, no falls, no diaphoresis, no palpitations, no headache, no blurred vision or any focal deficit. He has a cough which he says is chronic but is unable to cough up any phlegm.  CT imaging survey significant for bilateral multifocal pulmonary emboli with right heart strain.  Consistent with submassive pulmonary embolism.  Vascular surgery consulted.  Status post pulmonary thrombectomy on 11/2.  Tolerated procedure well.  Remains on 5 L.   Assessment & Plan:   Principal Problem:   Acute pulmonary embolism (HCC) Active Problems:   Hypertension   Alzheimer's dementia without behavioral disturbance (HCC)   BPH with obstruction/lower urinary tract symptoms  Submassive bilateral pulmonary emboli Right heart strain Elevated troponin Acute hypoxic respiratory failure CT imaging survey confirms above Bilateral lower extremity Doppler negative for DVT Patient on 5 L Status post pulmonary thrombectomy on 11/2 Plan: Discontinue heparin GTT Start Eliquis 10 mg twice daily x7 days, followed by 5 mg twice daily Telemetry monitoring Incentive spirometry Wean oxygen as tolerated Therapy evaluations as tolerated  Bradycardia Essential hypertension Unclear nature of bradycardia Possibly secondary to clot burden Continue holding Bystolic As needed IV hydralazine Telemetry  monitoring  Dementia Mental status baseline Continue Aricept  BPH Continue finasteride Flomax  DVT prophylaxis: Eliquis Code Status: DNR Family Communication: Attempted to call son Eulas Post [phone] on 11/3.  No answer, voicemail left Disposition Plan: Status is: Inpatient  Remains inpatient appropriate because: Submassive pulmonary embolism.  Disposition plan pending.  Level of care: Progressive Cardiac  Consultants:  Vascular surgery  Procedures:  Pulmonary thrombectomy 11/2  Antimicrobials: None   Subjective: Seen and examined.  Reports persistent shortness of breath.  No pain complaints  Objective: Vitals:   04/22/21 0042 04/22/21 0414 04/22/21 0810 04/22/21 1143  BP: 136/71 (!) 146/72 (!) 142/70 130/69  Pulse: (!) 51 (!) 51 (!) 46 (!) 56  Resp: 20 20 17 18   Temp: 98 F (36.7 C) 98 F (36.7 C) 97.9 F (36.6 C) 98.4 F (36.9 C)  TempSrc:      SpO2: 99% 100% 97% 96%  Weight:      Height:        Intake/Output Summary (Last 24 hours) at 04/22/2021 1234 Last data filed at 04/22/2021 0950 Gross per 24 hour  Intake 708.22 ml  Output 200 ml  Net 508.22 ml   Filed Weights   04/20/21 1718 04/21/21 1353  Weight: 108 kg 111.6 kg    Examination:  General exam: No acute distress Respiratory system: Lungs clear.  Normal work of breathing.  5 L Cardiovascular system: S1-S2, RRR, no murmurs, no pedal edema Gastrointestinal system: Soft, NT/ND, normal bowel sounds Central nervous system: Alert, oriented x2, no focal deficits Extremities: Symmetric 5 x 5 power. Skin: No rashes, lesions or ulcers Psychiatry: Judgement and insight appear impaired. Mood & affect flattened.     Data Reviewed: I have personally reviewed following labs  and imaging studies  CBC: Recent Labs  Lab 04/20/21 1510 04/21/21 0606 04/22/21 0612  WBC 11.5* 9.3 9.5  HGB 15.0 14.5 13.7  HCT 44.3 44.1 43.1  MCV 98.2 98.9 99.8  PLT 160 149* 616   Basic Metabolic Panel: Recent Labs   Lab 04/20/21 1510 04/21/21 0606  NA 140 139  K 4.8 4.5  CL 104 103  CO2 26 29  GLUCOSE 137* 108*  BUN 27* 24*  CREATININE 1.22 1.10  CALCIUM 8.8* 8.5*   GFR: Estimated Creatinine Clearance: 53 mL/min (by C-G formula based on SCr of 1.1 mg/dL). Liver Function Tests: Recent Labs  Lab 04/20/21 1510  AST 24  ALT 23  ALKPHOS 94  BILITOT 0.8  PROT 6.1*  ALBUMIN 3.1*   No results for input(s): LIPASE, AMYLASE in the last 168 hours. No results for input(s): AMMONIA in the last 168 hours. Coagulation Profile: Recent Labs  Lab 04/20/21 1510  INR 1.1   Cardiac Enzymes: No results for input(s): CKTOTAL, CKMB, CKMBINDEX, TROPONINI in the last 168 hours. BNP (last 3 results) No results for input(s): PROBNP in the last 8760 hours. HbA1C: No results for input(s): HGBA1C in the last 72 hours. CBG: No results for input(s): GLUCAP in the last 168 hours. Lipid Profile: No results for input(s): CHOL, HDL, LDLCALC, TRIG, CHOLHDL, LDLDIRECT in the last 72 hours. Thyroid Function Tests: No results for input(s): TSH, T4TOTAL, FREET4, T3FREE, THYROIDAB in the last 72 hours. Anemia Panel: No results for input(s): VITAMINB12, FOLATE, FERRITIN, TIBC, IRON, RETICCTPCT in the last 72 hours. Sepsis Labs: No results for input(s): PROCALCITON, LATICACIDVEN in the last 168 hours.  Recent Results (from the past 240 hour(s))  Resp Panel by RT-PCR (Flu A&B, Covid) Nasopharyngeal Swab     Status: None   Collection Time: 04/20/21  3:30 PM   Specimen: Nasopharyngeal Swab; Nasopharyngeal(NP) swabs in vial transport medium  Result Value Ref Range Status   SARS Coronavirus 2 by RT PCR NEGATIVE NEGATIVE Final    Comment: (NOTE) SARS-CoV-2 target nucleic acids are NOT DETECTED.  The SARS-CoV-2 RNA is generally detectable in upper respiratory specimens during the acute phase of infection. The lowest concentration of SARS-CoV-2 viral copies this assay can detect is 138 copies/mL. A negative result  does not preclude SARS-Cov-2 infection and should not be used as the sole basis for treatment or other patient management decisions. A negative result may occur with  improper specimen collection/handling, submission of specimen other than nasopharyngeal swab, presence of viral mutation(s) within the areas targeted by this assay, and inadequate number of viral copies(<138 copies/mL). A negative result must be combined with clinical observations, patient history, and epidemiological information. The expected result is Negative.  Fact Sheet for Patients:  EntrepreneurPulse.com.au  Fact Sheet for Healthcare Providers:  IncredibleEmployment.be  This test is no t yet approved or cleared by the [place] FDA and  has been authorized for detection and/or diagnosis of SARS-CoV-2 by FDA under an Emergency Use Authorization (EUA). This EUA will remain  in effect (meaning this test can be used) for the duration of the COVID-19 declaration under Section 564(b)(1) of the Act, 21 U.S.C.section 360bbb-3(b)(1), unless the authorization is terminated  or revoked sooner.       Influenza A by PCR NEGATIVE NEGATIVE Final   Influenza B by PCR NEGATIVE NEGATIVE Final    Comment: (NOTE) The Xpert Xpress SARS-CoV-2/FLU/RSV plus assay is intended as an aid in the diagnosis of influenza from Nasopharyngeal swab specimens and should not  be used as a sole basis for treatment. Nasal washings and aspirates are unacceptable for Xpert Xpress SARS-CoV-2/FLU/RSV testing.  Fact Sheet for Patients: EntrepreneurPulse.com.au  Fact Sheet for Healthcare Providers: IncredibleEmployment.be  This test is not yet approved or cleared by the [place] FDA and has been authorized for detection and/or diagnosis of SARS-CoV-2 by FDA under an Emergency Use Authorization (EUA). This EUA will remain in effect (meaning this test can be used) for  the duration of the COVID-19 declaration under Section 564(b)(1) of the Act, 21 U.S.C. section 360bbb-3(b)(1), unless the authorization is terminated or revoked.  Performed at [facility], [address] [place], [place] [address]          Radiology Studies: CT Angio Chest PE W and/or Wo Contrast  Result Date: 04/20/2021 CLINICAL DATA:  PE suspected, high prob EXAM: CT ANGIOGRAPHY CHEST WITH CONTRAST TECHNIQUE: Multidetector CT imaging of the chest was performed using the standard protocol during bolus administration of intravenous contrast. Multiplanar CT image reconstructions and MIPs were obtained to evaluate the vascular anatomy. CONTRAST:  22mL OMNIPAQUE IOHEXOL 350 MG/ML SOLN COMPARISON:  Chest x-ray 04/20/2021, CT abdomen pelvis 08/05/2015 FINDINGS: Cardiovascular: Satisfactory opacification of the pulmonary arteries to the segmental level. Extensive right middle, upper, lower as well as left lower segmental and subsegmental pulmonary emboli. Subsegment left upper lobe pulmonary emboli also noted. Enlarged main pulmonary artery. Enlarged right to left ventricular ratio of 1.5. The heart is otherwise normal in caliber. No pericardial effusion. At least mild atherosclerotic plaque. 4-vessel coronary calcifications. Mediastinum/Nodes: No enlarged mediastinal, hilar, or axillary lymph nodes. Thyroid gland, trachea, and esophagus demonstrate no significant findings. Lungs/Pleura: No focal consolidation. No pulmonary nodule. No pulmonary mass. Trace left pleural effusion. No right pleural effusion. No pneumothorax. Upper Abdomen: Partially visualized fluid density lesions within the kidneys likely representing simple renal cysts. Colonic diverticulosis. Otherwise no acute abnormality. Musculoskeletal: No chest wall abnormality. No suspicious lytic or blastic osseous lesions. No acute displaced fracture. Multilevel degenerative changes of the spine. Review of the MIP images confirms the  above findings. IMPRESSION: 1. Right middle, upper, lower as well as left lower segmental and subsegmental pulmonary emboli. Subsegmental left upper lobe pulmonary emboli. Associated right heart strain. No pulmonary infarction. 2. Aortic Atherosclerosis (ICD10-I70.0) including four-vessel coronary calcification. These results were called by telephone at the time of interpretation on 04/20/2021 at 4:29 pm to provider Merlyn Lot , who verbally acknowledged these results. Electronically Signed   By: Iven Finn M.D.   On: 04/20/2021 16:37   PERIPHERAL VASCULAR CATHETERIZATION  Result Date: 04/21/2021 See surgical note for result.  US Venous Img Lower Bilateral (DVT)  Result Date: 04/20/2021 CLINICAL DATA:  PE seen on a CTA of the chest today. Assess for venous thrombosis. EXAM: BILATERAL LOWER EXTREMITY VENOUS DOPPLER ULTRASOUND TECHNIQUE: Gray-scale sonography with compression, as well as color and duplex ultrasound, were performed to evaluate the deep venous system(s) from the level of the common femoral vein through the popliteal and proximal calf veins. COMPARISON:  None. FINDINGS: VENOUS Normal compressibility of the common femoral, superficial femoral, and popliteal veins, as well as the visualized calf veins. Visualized portions of profunda femoral vein and great saphenous vein unremarkable. No filling defects to suggest DVT on grayscale or color Doppler imaging. Doppler waveforms show normal direction of venous flow, normal respiratory plasticity and response to augmentation. Assessment of the peroneal vein the left with limited. OTHER None. Limitations: none IMPRESSION: No evidence of a deep venous thrombosis of either lower  extremity. Electronically Signed   By: Lajean Manes M.D.   On: 04/20/2021 18:36   DG Chest Port 1 View  Result Date: 04/22/2021 CLINICAL DATA:  Bradycardia.  Hypoxia. EXAM: PORTABLE CHEST 1 VIEW COMPARISON:  04/20/2021 FINDINGS: Chronic cardiomegaly and aortic  atherosclerosis. The patient has taken a poor inspiration. Density in both lung bases could relate to the poor inspiration, but there may be mild bibasilar atelectasis and or pneumonia. Upper lungs remain clear. IMPRESSION: Poor inspiration. Lung density at the bases could relate to the poor inspiration or there could be a degree of mild atelectasis or basilar pneumonia. Electronically Signed   By: Nelson Chimes M.D.   On: 04/22/2021 10:22   DG Chest Port 1 View  Result Date: 04/20/2021 CLINICAL DATA:  Shortness of breath. EXAM: PORTABLE CHEST 1 VIEW COMPARISON:  07/29/2015 FINDINGS: Stable cardiomediastinal contours. Aortic atherosclerosis. Decreased lung volumes. Blunting of the left costophrenic angle is noted suggestive of small left effusion. No interstitial edema or airspace disease. Degenerative changes noted within the thoracic spine. IMPRESSION: Suspect small left pleural effusion. Electronically Signed   By: Kerby Moors M.D.   On: 04/20/2021 15:42   ECHOCARDIOGRAM COMPLETE  Result Date: 04/21/2021    ECHOCARDIOGRAM REPORT   Patient Name:   CARSON BOGDEN Date of Exam: 04/20/2021 Medical Rec #:  [id number]       Height:       68.0 in Accession #:    [id number]      Weight:       238.0 lb Date of Birth:  Dec 31, 1926      BSA:          2.201 m Patient Age:    25 years        BP:           144/75 mmHg Patient Gender: M               HR:           53 bpm. Exam Location:  [facility] Procedure: 2D Echo, Cardiac Doppler, Color Doppler and Intracardiac            Opacification Agent Indications:     I26.09 Pulmonary embolus  History:         Patient has prior history of Echocardiogram examinations, most                  recent 04/10/2015. Risk Factors:Hypertension, Dyslipidemia and                  Obesity.  Sonographer:     Cresenciano Lick RDCS Referring Phys:  [id number] EPPIRJJO AGBATA Diagnosing Phys: Nelva Bush MD IMPRESSIONS  1. Left ventricular ejection fraction, by estimation, is 45 to 50%. The  left ventricle has mildly decreased function. Left ventricular endocardial border not optimally defined to evaluate regional wall motion. There is mild left ventricular hypertrophy.  Left ventricular diastolic parameters are indeterminate.  2. Right ventricular systolic function is mildly reduced. The right ventricular size is mildly enlarged.  3. Left atrial size was mildly dilated.  4. Right atrial size was mildly dilated.  5. The mitral valve is abnormal. Trivial mitral valve regurgitation. No evidence of mitral stenosis.  6. The aortic valve is tricuspid. There is mild calcification of the aortic valve. There is moderate thickening of the aortic valve. Aortic valve regurgitation is trivial. Mild to moderate aortic valve sclerosis/calcification is present, without any evidence of aortic stenosis.  7. Aortic dilatation noted. There is  mild dilatation of the aortic root, measuring 41 mm. There is borderline dilatation of the ascending aorta, measuring 35 mm.  8. The inferior vena cava is normal in size with greater than 50% respiratory variability, suggesting right atrial pressure of 3 mmHg. FINDINGS  Left Ventricle: Left ventricular ejection fraction, by estimation, is 45 to 50%. The left ventricle has mildly decreased function. Left ventricular endocardial border not optimally defined to evaluate regional wall motion. Definity contrast agent was given IV to delineate the left ventricular endocardial borders. The left ventricular internal cavity size was normal in size. There is mild left ventricular hypertrophy. Left ventricular diastolic parameters are indeterminate. Right Ventricle: The right ventricular size is mildly enlarged. No increase in right ventricular wall thickness. Right ventricular systolic function is mildly reduced. Left Atrium: Left atrial size was mildly dilated. Right Atrium: Right atrial size was mildly dilated. Pericardium: There is no evidence of pericardial effusion. Mitral Valve: The  mitral valve is abnormal. There is mild thickening of the mitral valve leaflet(s). Trivial mitral valve regurgitation. No evidence of mitral valve stenosis. Tricuspid Valve: The tricuspid valve is normal in structure. Tricuspid valve regurgitation is mild. Aortic Valve: The aortic valve is tricuspid. There is mild calcification of the aortic valve. There is moderate thickening of the aortic valve. Aortic valve regurgitation is trivial. Mild to moderate aortic valve sclerosis/calcification is present, without any evidence of aortic stenosis. Pulmonic Valve: The pulmonic valve was not well visualized. Pulmonic valve regurgitation is mild. No evidence of pulmonic stenosis. Aorta: Aortic dilatation noted. There is mild dilatation of the aortic root, measuring 41 mm. There is borderline dilatation of the ascending aorta, measuring 35 mm. Pulmonary Artery: The pulmonary artery is not well seen. Venous: The inferior vena cava is normal in size with greater than 50% respiratory variability, suggesting right atrial pressure of 3 mmHg. IAS/Shunts: The interatrial septum was not well visualized.  LEFT VENTRICLE PLAX 2D LVIDd:         4.44 cm   Diastology LVIDs:         2.78 cm   LV e' medial:    6.96 cm/s LV PW:         1.29 cm   LV E/e' medial:  10.7 LV IVS:        1.25 cm   LV e' lateral:   7.40 cm/s LVOT diam:     2.30 cm   LV E/e' lateral: 10.1 LV SV:         100 LV SV Index:   45 LVOT Area:     4.15 cm  RIGHT VENTRICLE             IVC RV Basal diam:  4.66 cm     IVC diam: 1.99 cm RV S prime:     19.00 cm/s TAPSE (M-mode): 3.7 cm LEFT ATRIUM             Index        RIGHT ATRIUM           Index LA diam:        5.10 cm 2.32 cm/m   RA Area:     23.30 cm LA Vol (A2C):   88.9 ml 40.40 ml/m  RA Volume:   81.60 ml  37.08 ml/m LA Vol (A4C):   62.1 ml 28.22 ml/m LA Biplane Vol: 75.6 ml 34.35 ml/m  AORTIC VALVE LVOT Vmax:   121.50 cm/s LVOT Vmean:  81.600 cm/s LVOT VTI:    0.240 m  AORTA Ao Root diam: 4.10 cm Ao Asc diam:   3.50 cm MITRAL VALVE MV Area (PHT): 3.77 cm     SHUNTS MV Decel Time: 201 msec     Systemic VTI:  0.24 m MV E velocity: 74.60 cm/s   Systemic Diam: 2.30 cm MV A velocity: 109.00 cm/s MV E/A ratio:  0.68 Christopher End MD Electronically signed by Nelva Bush MD Signature Date/Time: 04/21/2021/8:29:27 AM    Final         Scheduled Meds:  docusate sodium  200 mg Oral QHS   donepezil  5 mg Oral QHS   finasteride  5 mg Oral Daily   furosemide  40 mg Oral q morning   multivitamin with minerals  1 tablet Oral Daily   potassium chloride SA  20 mEq Oral Daily   sodium chloride flush  3 mL Intravenous Q12H   tamsulosin  0.4 mg Oral Daily   Continuous Infusions:  sodium chloride     heparin 1,600 Units/hr (04/21/21 2254)     LOS: 2 days    Time spent: 25 minutes    Sidney Ace, MD [facility]   If 7PM-7AM, please contact night-coverage  04/22/2021, 12:34 PM

## 2021-04-22 NOTE — Consult Note (Signed)
ANTICOAGULATION CONSULT NOTE   Pharmacy Consult for Heparin Infusion Indication: pulmonary embolus  Allergies  Allergen Reactions   No Known Allergies     Patient Measurements: Height: 5\' 8"  (172.7 cm) Weight: 111.6 kg (246 lb) IBW/kg (Calculated) : 68.4 Heparin Dosing Weight: 92.2 kg  Vital Signs: Temp: 98 F (36.7 C) (11/03 0414) BP: 146/72 (11/03 0414) Pulse Rate: 51 (11/03 0414)  Labs: Recent Labs    04/20/21 1510 04/20/21 1741 04/21/21 0220 04/21/21 0606 04/21/21 0917 04/22/21 0612  HGB 15.0  --   --  14.5  --  13.7  HCT 44.3  --   --  44.1  --  43.1  PLT 160  --   --  149*  --  152  APTT 28  --   --   --   --   --   LABPROT 13.8  --   --   --   --   --   INR 1.1  --   --   --   --   --   HEPARINUNFRC  --   --  0.70  --  0.50 0.55  CREATININE 1.22  --   --  1.10  --   --   TROPONINIHS 109* 115*  --   --   --   --      Estimated Creatinine Clearance: 53 mL/min (by C-G formula based on SCr of 1.1 mg/dL).   Medical History: Past Medical History:  Diagnosis Date   BPH (benign prostatic hyperplasia)    Duodenal mass    Elevated PSA    Gross hematuria    Hyperlipidemia    Hypertension    Hypertrophy (benign) of prostate    asymptomatic on finasteride. will f/up with urologist [name]   Kidney lesion 07/2015   pt has appt at kidney specialist 08/05/15 to review CT scan.   Obesity    Overweight    Tubular adenoma of colon    Urinary retention     Medications:  No history of chronic anticoagulant use PTA.  Assessment: Pharmacy has been consulted to initiate heparin infusion on 87yo patient admitted with bradycardia and hypoxia. Upon review of CTA, has bilateral acute appearing PE. S/p Mechanical thrombectomy.   11/02 0220 HL 0.70, borderline supratherapeutic 11/02 0917 HL 0.50, therapeutic (per nurse, pt unable to get thrombectomy 11/2 and stated she paused the heparin drip for ~15 min) 11/03 0612 HL 0.55   Goal of Therapy:  Heparin level  0.3-0.7 units/ml Monitor platelets by anticoagulation protocol: Yes   Plan:  Heparin level is therapeutic. Will continue heparin infusion at 1600 units/hr. Recheck HL and CBC with AM labs. F/u with transition to oral anticoagulation.   Oswald Hillock, PharmD Clinical Pharmacist 04/22/2021 7:48 AM

## 2021-04-23 DIAGNOSIS — I2609 Other pulmonary embolism with acute cor pulmonale: Secondary | ICD-10-CM | POA: Diagnosis not present

## 2021-04-23 DIAGNOSIS — I519 Heart disease, unspecified: Secondary | ICD-10-CM | POA: Diagnosis not present

## 2021-04-23 DIAGNOSIS — R0902 Hypoxemia: Secondary | ICD-10-CM | POA: Diagnosis not present

## 2021-04-23 DIAGNOSIS — I509 Heart failure, unspecified: Secondary | ICD-10-CM | POA: Diagnosis not present

## 2021-04-23 DIAGNOSIS — R404 Transient alteration of awareness: Secondary | ICD-10-CM | POA: Diagnosis not present

## 2021-04-23 DIAGNOSIS — M6281 Muscle weakness (generalized): Secondary | ICD-10-CM | POA: Diagnosis not present

## 2021-04-23 DIAGNOSIS — R609 Edema, unspecified: Secondary | ICD-10-CM | POA: Diagnosis not present

## 2021-04-23 DIAGNOSIS — N401 Enlarged prostate with lower urinary tract symptoms: Secondary | ICD-10-CM | POA: Diagnosis not present

## 2021-04-23 DIAGNOSIS — F0280 Dementia in other diseases classified elsewhere without behavioral disturbance: Secondary | ICD-10-CM | POA: Diagnosis not present

## 2021-04-23 DIAGNOSIS — J9601 Acute respiratory failure with hypoxia: Secondary | ICD-10-CM | POA: Diagnosis not present

## 2021-04-23 DIAGNOSIS — Z9981 Dependence on supplemental oxygen: Secondary | ICD-10-CM | POA: Diagnosis not present

## 2021-04-23 DIAGNOSIS — G308 Other Alzheimer's disease: Secondary | ICD-10-CM | POA: Diagnosis not present

## 2021-04-23 DIAGNOSIS — R001 Bradycardia, unspecified: Secondary | ICD-10-CM | POA: Diagnosis not present

## 2021-04-23 DIAGNOSIS — G309 Alzheimer's disease, unspecified: Secondary | ICD-10-CM | POA: Diagnosis not present

## 2021-04-23 DIAGNOSIS — N138 Other obstructive and reflux uropathy: Secondary | ICD-10-CM | POA: Diagnosis not present

## 2021-04-23 DIAGNOSIS — I959 Hypotension, unspecified: Secondary | ICD-10-CM | POA: Diagnosis not present

## 2021-04-23 DIAGNOSIS — I10 Essential (primary) hypertension: Secondary | ICD-10-CM | POA: Diagnosis not present

## 2021-04-23 DIAGNOSIS — I2699 Other pulmonary embolism without acute cor pulmonale: Secondary | ICD-10-CM | POA: Diagnosis not present

## 2021-04-23 DIAGNOSIS — Z48812 Encounter for surgical aftercare following surgery on the circulatory system: Secondary | ICD-10-CM | POA: Diagnosis not present

## 2021-04-23 DIAGNOSIS — R2681 Unsteadiness on feet: Secondary | ICD-10-CM | POA: Diagnosis not present

## 2021-04-23 DIAGNOSIS — R21 Rash and other nonspecific skin eruption: Secondary | ICD-10-CM | POA: Diagnosis not present

## 2021-04-23 LAB — CBC
HCT: 42.5 % (ref 39.0–52.0)
Hemoglobin: 13.4 g/dL (ref 13.0–17.0)
MCH: 31.3 pg (ref 26.0–34.0)
MCHC: 31.5 g/dL (ref 30.0–36.0)
MCV: 99.3 fL (ref 80.0–100.0)
Platelets: 167 10*3/uL (ref 150–400)
RBC: 4.28 MIL/uL (ref 4.22–5.81)
RDW: 12.8 % (ref 11.5–15.5)
WBC: 7.9 10*3/uL (ref 4.0–10.5)
nRBC: 0 % (ref 0.0–0.2)

## 2021-04-23 LAB — BASIC METABOLIC PANEL
Anion gap: 5 (ref 5–15)
BUN: 20 mg/dL (ref 8–23)
CO2: 33 mmol/L — ABNORMAL HIGH (ref 22–32)
Calcium: 8.4 mg/dL — ABNORMAL LOW (ref 8.9–10.3)
Chloride: 102 mmol/L (ref 98–111)
Creatinine, Ser: 0.99 mg/dL (ref 0.61–1.24)
GFR, Estimated: >60 mL/min (ref >60)
Glucose, Bld: 95 mg/dL (ref 70–99)
Potassium: 4.5 mmol/L (ref 3.5–5.1)
Sodium: 140 mmol/L (ref 135–145)

## 2021-04-23 LAB — RESP PANEL BY RT-PCR (FLU A&B, COVID) ARPGX2
Influenza A by PCR: NEGATIVE
Influenza B by PCR: NEGATIVE
SARS Coronavirus 2 by RT PCR: NEGATIVE

## 2021-04-23 MED ORDER — APIXABAN 5 MG PO TABS: 5.0000 mg | ORAL_TABLET | Freq: Two times a day (BID) | ORAL | Status: DC

## 2021-04-23 MED ORDER — APIXABAN 5 MG PO TABS: 10.0000 mg | ORAL_TABLET | Freq: Two times a day (BID) | ORAL | Status: DC

## 2021-04-23 NOTE — Progress Notes (Signed)
Occupational Therapy Treatment Patient Details Name: Christopher Jenkins MRN: [id number] DOB: 1927/04/27 Today's Date: 04/23/2021   History of present illness Pt is a 87 y.o. male presenting to hospital 11/1 with reported low HR (in 30's) and hypoxic in the 70's.  Pt admitted with acute B PE with hypoxia and evidence of R heart strain.  S/p pulmonary thrombectomy 04/21/21.  PMH includes BPH, duodenal mass, HLD, htn, hypertrophy of prostate, urinary retention, R knee sx, R THA.   OT comments  Mr Cansler was seen for OT treatment on this date. Upon arrival to room pt reclined in bed, agreeable to tx. Pt currently requires CGA for toilet t/f and perihygiene in standing - MOD A for thoroughness. SETUP don/doff gown standing EOB. SpO2 93% on RA at rest, desat 84% on RA with mobility. Resolved to 91% on 2L [place]. Pt making good progress toward goals. Pt continues to benefit from skilled OT services to maximize return to PLOF and minimize risk of future falls, injury, caregiver burden, and readmission. Will continue to follow POC. Discharge recommendation remains appropriate.     Recommendations for follow up therapy are one component of a multi-disciplinary discharge planning process, led by the attending physician.  Recommendations may be updated based on patient status, additional functional criteria and insurance authorization.    Follow Up Recommendations  Home health OT    Assistance Recommended at Discharge Intermittent Supervision/Assistance  Equipment Recommendations  None recommended by OT    Recommendations for Other Services      Precautions / Restrictions Precautions Precautions: Fall Restrictions Weight Bearing Restrictions: No       Mobility Bed Mobility Overal bed mobility: Needs Assistance Bed Mobility: Supine to Sit;Sit to Supine     Supine to sit: Supervision;HOB elevated Sit to supine: Supervision;HOB elevated        Transfers Overall transfer level: Needs  assistance Equipment used: None Transfers: Sit to/from Stand Sit to Stand: Min guard                 Balance Overall balance assessment: Needs assistance Sitting-balance support: No upper extremity supported;Feet supported Sitting balance-Leahy Scale: Good     Standing balance support: No upper extremity supported;During functional activity Standing balance-Leahy Scale: Fair Standing balance comment: difficulty with weight shifting                           ADL either performed or assessed with clinical judgement   ADL Overall ADL's : Needs assistance/impaired                                       General ADL Comments: CGA for toilet t/f and perihygiene in standing - MOD A for thoroughness. SETUP don/doff gown standing EOB.      Cognition Arousal/Alertness: Awake/alert Behavior During Therapy: WFL for tasks assessed/performed Overall Cognitive Status: Within Functional Limits for tasks assessed                                            Exercises Exercises: Other exercises Other Exercises Other Exercises: Pt educated re; ECS, DME recs, d/c recs, falls prevention Other Exercises: UBD, sup<>sit, sit<>stand, toileting, sitting/standing balance/tolerance   Shoulder Instructions       General Comments SpO2 93% on  RA at rest, desat 84% on RA with mobility. Resolved to 91% on 2L [place]    Pertinent Vitals/ Pain       Pain Assessment: No/denies pain         Frequency  Min 2X/week        Progress Toward Goals  OT Goals(current goals can now be found in the care plan section)  Progress towards OT goals: Progressing toward goals  Acute Rehab OT Goals Patient Stated Goal: to go home OT Goal Formulation: With patient Time For Goal Achievement: 05/07/21 Potential to Achieve Goals: Good ADL Goals Pt Will Perform Grooming: Independently;standing Pt Will Perform Lower Body Dressing: Independently;sit to/from stand Pt  Will Transfer to Toilet: Independently;ambulating;regular height toilet  Plan Discharge plan remains appropriate;Frequency remains appropriate    Co-evaluation                 AM-PAC OT "6 Clicks" Daily Activity     Outcome Measure   Help from another person eating meals?: None Help from another person taking care of personal grooming?: A Little Help from another person toileting, which includes using toliet, bedpan, or urinal?: A Little Help from another person bathing (including washing, rinsing, drying)?: A Little Help from another person to put on and taking off regular upper body clothing?: None Help from another person to put on and taking off regular lower body clothing?: A Little 6 Click Score: 20    End of Session Equipment Utilized During Treatment: Oxygen  OT Visit Diagnosis: Other abnormalities of gait and mobility (R26.89);Muscle weakness (generalized) (M62.81)   Activity Tolerance Patient tolerated treatment well   Patient Left with call bell/phone within reach;in bed;with bed alarm set   Nurse Communication Mobility status        Time: 1610-9604 OT Time Calculation (min): 27 min  Charges: OT General Charges $OT Visit: 1 Visit OT Treatments $Self Care/Home Management : 23-37 mins  Dessie Coma, M.S. OTR/L  04/23/21, 12:49 PM  ascom [phone]

## 2021-04-23 NOTE — Progress Notes (Signed)
Discharge Report called to [facility]/ iv and tele removed/ EMS to transport

## 2021-04-23 NOTE — TOC Transition Note (Signed)
Transition of Care [facility]) - CM/SW Discharge Note   Patient Details  Name: Christopher Jenkins MRN: [id number] Date of Birth: 09-29-1926  [facility] CM/SW Contact:  Alberteen Sam, LCSW Phone Number: 04/23/2021, 1:30 PM   Clinical Narrative:     Patient will DC to: [facility]F Anticipated DC date: 04/23/21 Family notified: son contacted by [name] Transport by: Johnanna Schneiders  Per MD patient ready for DC to [facility]. RN, patient, patient's family, and facility notified of DC. Discharge Summary sent to facility. RN given number for report [phone] Room 101  . DC packet on chart. Ambulance transport requested for patient.  CSW signing off.  [place], [place]   Final next level of care: Skilled Nursing Facility Barriers to Discharge: No Barriers Identified   Patient Goals and CMS Choice Patient states their goals for this hospitalization and ongoing recovery are:: to go home CMS Medicare.gov Compare Post Acute Care list provided to:: Patient Represenative (must comment) (son) Choice offered to / list presented to : Adult Children  Discharge Placement              Patient chooses bed at: [facility] Patient to be transferred to facility by: [facility] Name of family member notified: son Patient and family notified of of transfer: 04/23/21  Discharge Plan and Services                                     Social Determinants of Health (SDOH) Interventions     Readmission Risk Interventions No flowsheet data found.

## 2021-04-23 NOTE — Discharge Summary (Signed)
Physician Discharge Summary  Christopher Jenkins YNW:[id number] DOB: 12/07/1926 DOA: 04/20/2021  PCP: [name], MD  Admit date: 04/20/2021 Discharge date: 04/23/2021  Admitted From: Home, [facility] Disposition: Skilled nursing facility, [facility]  Recommendations for Outpatient Follow-up:  Follow up with PCP in 1-2 weeks   Home Health: No Equipment/Devices: Yes oxygen 2 L via nasal cannula  Discharge Condition: Stable CODE STATUS: DNR Diet recommendation: Heart healthy  Brief/Interim Summary: 87 y.o. male with medical history significant for hypertension, BPH, obesity who presents to the emergency room via EMS for evaluation of feeling unwell.  The assisted facility staff called EMS because patient was found to have a low heart rate between 39 and 48 and room air pulse oximetry between 78 and 85%.  Patient denies having any chest pain or shortness of breath.  He denies having abdominal pain, no nausea, no vomiting, no dizziness, no lightheadedness, no leg swelling, no falls, no diaphoresis, no palpitations, no headache, no blurred vision or any focal deficit. He has a cough which he says is chronic but is unable to cough up any phlegm.   CT imaging survey significant for bilateral multifocal pulmonary emboli with right heart strain.  Consistent with submassive pulmonary embolism.  Vascular surgery consulted.  Status post pulmonary thrombectomy on 11/2.  Tolerated procedure well.  Initially required 5 L postoperatively.  Weaned to 2 L with use of incentive spirometry and ambulation.  Unable to wean past 2 L.  Ambulated prior to discharge.  Will need 2 L continuous for at least a period of 6 to 29months post discharge.  At time of discharge I recommend patient stop his home Bystolic as he has been relatively bradycardic.  No blood pressure medication has been added in its place.  Recommend close monitoring of blood pressure and consideration for addition of alternative  agent as an outpatient.  Eliquis starter pack prescribed on discharge.    Discharge Diagnoses:  Principal Problem:   Acute pulmonary embolism (HCC) Active Problems:   Hypertension   Alzheimer's dementia without behavioral disturbance (HCC)   BPH with obstruction/lower urinary tract symptoms  Submassive bilateral pulmonary emboli Right heart strain Elevated troponin Acute hypoxic respiratory failure CT imaging survey confirms above Bilateral lower extremity Doppler negative for DVT Status post pulmonary thrombectomy on 11/2 Required 5 L, weaned to at time of discharge Plan: Discharge to skilled nursing facility.  Eliquis starter pack prescribed.  Follow-up outpatient PCP.  Consider repeat CT thorax in 6 months to assess for continued need of therapeutic anticoagulation.   Bradycardia Essential hypertension Unclear nature of bradycardia Possibly secondary to clot burden Plan: On discharge I have stopped the patient's home Bystolic.  Blood pressure has been controlled off of BP agents.  Considering patient's advanced age will not start another blood pressure agent at this time.  Recommend outpatient follow-up with PCP to discuss continued need for antihypertensive therapy.   Dementia Mental status baseline Continue Aricept   BPH Continue finasteride Flomax  Discharge Instructions  Discharge Instructions     Diet - low sodium heart healthy   Complete by: As directed    Increase activity slowly   Complete by: As directed       Allergies as of 04/23/2021       Reactions   No Known Allergies         Medication List     STOP taking these medications    Bystolic 2.5 MG tablet Generic drug: nebivolol   memantine  10 MG tablet Commonly known as: NAMENDA       TAKE these medications    apixaban 5 MG Tabs tablet Commonly known as: ELIQUIS Take 2 tablets (10 mg total) by mouth 2 (two) times daily.   apixaban 5 MG Tabs tablet Commonly known as:  ELIQUIS Take 1 tablet (5 mg total) by mouth 2 (two) times daily. Start taking on: April 29, 2021   aspirin EC 81 MG tablet Take 81 mg by mouth daily.   Docusate Sodium 150 MG/15ML syrup Commonly known as: Docu PLACE 3 DROPS IN EACH EAR DAILY TO PREVENT OCCLUSION WITH WAX   docusate sodium 100 MG capsule Commonly known as: Colace Take 2 capsules (200 mg total) by mouth at bedtime.   donepezil 5 MG tablet Commonly known as: ARICEPT Take 5 mg by mouth at bedtime.   finasteride 5 MG tablet Commonly known as: PROSCAR TAKE 1 TABLET BY MOUTH DAILY.   furosemide 20 MG tablet Commonly known as: LASIX Take 2 tablets (40 mg total) by mouth every morning.   multivitamin with minerals tablet Take 1 tablet by mouth daily. At 1400   potassium chloride SA 20 MEQ tablet Commonly known as: KLOR-CON TAKE 1 TABLET BY MOUTH DAILY   tamsulosin 0.4 MG Caps capsule Commonly known as: FLOMAX TAKE 1 CAPSULE BY MOUTH DAILY.               Durable Medical Equipment  (From admission, onward)           Start     Ordered   04/23/21 1003  For home use only DME oxygen  Once       Question Answer Comment  Length of Need 12 Months   Mode or (Route) Nasal cannula   Liters per Minute 2   Frequency Continuous (stationary and portable oxygen unit needed)   Oxygen conserving device Yes   Oxygen delivery system Gas      04/23/21 1002            Allergies  Allergen Reactions   No Known Allergies     Consultations: Vascular surgery   Procedures/Studies: CT Angio Chest PE W and/or Wo Contrast  Result Date: 04/20/2021 CLINICAL DATA:  PE suspected, high prob EXAM: CT ANGIOGRAPHY CHEST WITH CONTRAST TECHNIQUE: Multidetector CT imaging of the chest was performed using the standard protocol during bolus administration of intravenous contrast. Multiplanar CT image reconstructions and MIPs were obtained to evaluate the vascular anatomy. CONTRAST:  26mL OMNIPAQUE IOHEXOL 350 MG/ML  SOLN COMPARISON:  Chest x-ray 04/20/2021, CT abdomen pelvis 08/05/2015 FINDINGS: Cardiovascular: Satisfactory opacification of the pulmonary arteries to the segmental level. Extensive right middle, upper, lower as well as left lower segmental and subsegmental pulmonary emboli. Subsegment left upper lobe pulmonary emboli also noted. Enlarged main pulmonary artery. Enlarged right to left ventricular ratio of 1.5. The heart is otherwise normal in caliber. No pericardial effusion. At least mild atherosclerotic plaque. 4-vessel coronary calcifications. Mediastinum/Nodes: No enlarged mediastinal, hilar, or axillary lymph nodes. Thyroid gland, trachea, and esophagus demonstrate no significant findings. Lungs/Pleura: No focal consolidation. No pulmonary nodule. No pulmonary mass. Trace left pleural effusion. No right pleural effusion. No pneumothorax. Upper Abdomen: Partially visualized fluid density lesions within the kidneys likely representing simple renal cysts. Colonic diverticulosis. Otherwise no acute abnormality. Musculoskeletal: No chest wall abnormality. No suspicious lytic or blastic osseous lesions. No acute displaced fracture. Multilevel degenerative changes of the spine. Review of the MIP images confirms the above findings. IMPRESSION: 1. Right middle,  upper, lower as well as left lower segmental and subsegmental pulmonary emboli. Subsegmental left upper lobe pulmonary emboli. Associated right heart strain. No pulmonary infarction. 2. Aortic Atherosclerosis (ICD10-I70.0) including four-vessel coronary calcification. These results were called by telephone at the time of interpretation on 04/20/2021 at 4:29 pm to provider Merlyn Lot , who verbally acknowledged these results. Electronically Signed   By: Iven Finn M.D.   On: 04/20/2021 16:37   PERIPHERAL VASCULAR CATHETERIZATION  Result Date: 04/21/2021 See surgical note for result.  US Venous Img Lower Bilateral (DVT)  Result Date:  04/20/2021 CLINICAL DATA:  PE seen on a CTA of the chest today. Assess for venous thrombosis. EXAM: BILATERAL LOWER EXTREMITY VENOUS DOPPLER ULTRASOUND TECHNIQUE: Gray-scale sonography with compression, as well as color and duplex ultrasound, were performed to evaluate the deep venous system(s) from the level of the common femoral vein through the popliteal and proximal calf veins. COMPARISON:  None. FINDINGS: VENOUS Normal compressibility of the common femoral, superficial femoral, and popliteal veins, as well as the visualized calf veins. Visualized portions of profunda femoral vein and great saphenous vein unremarkable. No filling defects to suggest DVT on grayscale or color Doppler imaging. Doppler waveforms show normal direction of venous flow, normal respiratory plasticity and response to augmentation. Assessment of the peroneal vein the left with limited. OTHER None. Limitations: none IMPRESSION: No evidence of a deep venous thrombosis of either lower extremity. Electronically Signed   By: Lajean Manes M.D.   On: 04/20/2021 18:36   DG Chest Port 1 View  Result Date: 04/22/2021 CLINICAL DATA:  Bradycardia.  Hypoxia. EXAM: PORTABLE CHEST 1 VIEW COMPARISON:  04/20/2021 FINDINGS: Chronic cardiomegaly and aortic atherosclerosis. The patient has taken a poor inspiration. Density in both lung bases could relate to the poor inspiration, but there may be mild bibasilar atelectasis and or pneumonia. Upper lungs remain clear. IMPRESSION: Poor inspiration. Lung density at the bases could relate to the poor inspiration or there could be a degree of mild atelectasis or basilar pneumonia. Electronically Signed   By: Nelson Chimes M.D.   On: 04/22/2021 10:22   DG Chest Port 1 View  Result Date: 04/20/2021 CLINICAL DATA:  Shortness of breath. EXAM: PORTABLE CHEST 1 VIEW COMPARISON:  07/29/2015 FINDINGS: Stable cardiomediastinal contours. Aortic atherosclerosis. Decreased lung volumes. Blunting of the left costophrenic  angle is noted suggestive of small left effusion. No interstitial edema or airspace disease. Degenerative changes noted within the thoracic spine. IMPRESSION: Suspect small left pleural effusion. Electronically Signed   By: Kerby Moors M.D.   On: 04/20/2021 15:42   ECHOCARDIOGRAM COMPLETE  Result Date: 04/21/2021    ECHOCARDIOGRAM REPORT   Patient Name:   Christopher Jenkins Date of Exam: 04/20/2021 Medical Rec #:  [id number]       Height:       68.0 in Accession #:    [id number]      Weight:       238.0 lb Date of Birth:  02-Aug-1926      BSA:          2.201 m Patient Age:    36 years        BP:           144/75 mmHg Patient Gender: M               HR:           53 bpm. Exam Location:  [facility] Procedure: 2D Echo, Cardiac Doppler, Color Doppler and Intracardiac  Opacification Agent Indications:     I26.09 Pulmonary embolus  History:         Patient has prior history of Echocardiogram examinations, most                  recent 04/10/2015. Risk Factors:Hypertension, Dyslipidemia and                  Obesity.  Sonographer:     Cresenciano Lick RDCS Referring Phys:  [id number] AGBATA Diagnosing Phys: Nelva Bush MD IMPRESSIONS  1. Left ventricular ejection fraction, by estimation, is 45 to 50%. The left ventricle has mildly decreased function. Left ventricular endocardial border not optimally defined to evaluate regional wall motion. There is mild left ventricular hypertrophy.  Left ventricular diastolic parameters are indeterminate.  2. Right ventricular systolic function is mildly reduced. The right ventricular size is mildly enlarged.  3. Left atrial size was mildly dilated.  4. Right atrial size was mildly dilated.  5. The mitral valve is abnormal. Trivial mitral valve regurgitation. No evidence of mitral stenosis.  6. The aortic valve is tricuspid. There is mild calcification of the aortic valve. There is moderate thickening of the aortic valve. Aortic valve regurgitation is trivial. Mild to  moderate aortic valve sclerosis/calcification is present, without any evidence of aortic stenosis.  7. Aortic dilatation noted. There is mild dilatation of the aortic root, measuring 41 mm. There is borderline dilatation of the ascending aorta, measuring 35 mm.  8. The inferior vena cava is normal in size with greater than 50% respiratory variability, suggesting right atrial pressure of 3 mmHg. FINDINGS  Left Ventricle: Left ventricular ejection fraction, by estimation, is 45 to 50%. The left ventricle has mildly decreased function. Left ventricular endocardial border not optimally defined to evaluate regional wall motion. Definity contrast agent was given IV to delineate the left ventricular endocardial borders. The left ventricular internal cavity size was normal in size. There is mild left ventricular hypertrophy. Left ventricular diastolic parameters are indeterminate. Right Ventricle: The right ventricular size is mildly enlarged. No increase in right ventricular wall thickness. Right ventricular systolic function is mildly reduced. Left Atrium: Left atrial size was mildly dilated. Right Atrium: Right atrial size was mildly dilated. Pericardium: There is no evidence of pericardial effusion. Mitral Valve: The mitral valve is abnormal. There is mild thickening of the mitral valve leaflet(s). Trivial mitral valve regurgitation. No evidence of mitral valve stenosis. Tricuspid Valve: The tricuspid valve is normal in structure. Tricuspid valve regurgitation is mild. Aortic Valve: The aortic valve is tricuspid. There is mild calcification of the aortic valve. There is moderate thickening of the aortic valve. Aortic valve regurgitation is trivial. Mild to moderate aortic valve sclerosis/calcification is present, without any evidence of aortic stenosis. Pulmonic Valve: The pulmonic valve was not well visualized. Pulmonic valve regurgitation is mild. No evidence of pulmonic stenosis. Aorta: Aortic dilatation noted. There  is mild dilatation of the aortic root, measuring 41 mm. There is borderline dilatation of the ascending aorta, measuring 35 mm. Pulmonary Artery: The pulmonary artery is not well seen. Venous: The inferior vena cava is normal in size with greater than 50% respiratory variability, suggesting right atrial pressure of 3 mmHg. IAS/Shunts: The interatrial septum was not well visualized.  LEFT VENTRICLE PLAX 2D LVIDd:         4.44 cm   Diastology LVIDs:         2.78 cm   LV e' medial:    6.96 cm/s LV PW:  1.29 cm   LV E/e' medial:  10.7 LV IVS:        1.25 cm   LV e' lateral:   7.40 cm/s LVOT diam:     2.30 cm   LV E/e' lateral: 10.1 LV SV:         100 LV SV Index:   45 LVOT Area:     4.15 cm  RIGHT VENTRICLE             IVC RV Basal diam:  4.66 cm     IVC diam: 1.99 cm RV S prime:     19.00 cm/s TAPSE (M-mode): 3.7 cm LEFT ATRIUM             Index        RIGHT ATRIUM           Index LA diam:        5.10 cm 2.32 cm/m   RA Area:     23.30 cm LA Vol (A2C):   88.9 ml 40.40 ml/m  RA Volume:   81.60 ml  37.08 ml/m LA Vol (A4C):   62.1 ml 28.22 ml/m LA Biplane Vol: 75.6 ml 34.35 ml/m  AORTIC VALVE LVOT Vmax:   121.50 cm/s LVOT Vmean:  81.600 cm/s LVOT VTI:    0.240 m  AORTA Ao Root diam: 4.10 cm Ao Asc diam:  3.50 cm MITRAL VALVE MV Area (PHT): 3.77 cm     SHUNTS MV Decel Time: 201 msec     Systemic VTI:  0.24 m MV E velocity: 74.60 cm/s   Systemic Diam: 2.30 cm MV A velocity: 109.00 cm/s MV E/A ratio:  0.68 Harrell Gave End MD Electronically signed by Nelva Bush MD Signature Date/Time: 04/21/2021/8:29:27 AM    Final       Subjective: Seen and examined at the time of discharge.  Stable no distress.  Chest pain-free.  Normal work of breathing.  Stable for discharge.  Discharge Exam: Vitals:   04/23/21 0832 04/23/21 1201  BP: (!) 133/52 (!) 135/53  Pulse: (!) 59 71  Resp: 18 19  Temp: 98.6 F (37 C) 98.4 F (36.9 C)  SpO2: 98% 93%   Vitals:   04/23/21 0016 04/23/21 0403 04/23/21 0832 04/23/21  1201  BP: (!) 141/75 126/65 (!) 133/52 (!) 135/53  Pulse: (!) 44 (!) 46 (!) 59 71  Resp: 20 20 18 19   Temp: 98.3 F (36.8 C) 98.1 F (36.7 C) 98.6 F (37 C) 98.4 F (36.9 C)  TempSrc:   Oral Oral  SpO2: 94% 100% 98% 93%  Weight:      Height:        General: Pt is alert, awake, not in acute distress Cardiovascular: RRR, S1/S2 +, no rubs, no gallops Respiratory: CTA bilaterally, no wheezing, no rhonchi Abdominal: Soft, NT, ND, bowel sounds + Extremities: no edema, no cyanosis    The results of significant diagnostics from this hospitalization (including imaging, microbiology, ancillary and laboratory) are listed below for reference.     Microbiology: Recent Results (from the past 240 hour(s))  Resp Panel by RT-PCR (Flu A&B, Covid) Nasopharyngeal Swab     Status: None   Collection Time: 04/20/21  3:30 PM   Specimen: Nasopharyngeal Swab; Nasopharyngeal(NP) swabs in vial transport medium  Result Value Ref Range Status   SARS Coronavirus 2 by RT PCR NEGATIVE NEGATIVE Final    Comment: (NOTE) SARS-CoV-2 target nucleic acids are NOT DETECTED.  The SARS-CoV-2 RNA is generally detectable in upper respiratory specimens  during the acute phase of infection. The lowest concentration of SARS-CoV-2 viral copies this assay can detect is 138 copies/mL. A negative result does not preclude SARS-Cov-2 infection and should not be used as the sole basis for treatment or other patient management decisions. A negative result may occur with  improper specimen collection/handling, submission of specimen other than nasopharyngeal swab, presence of viral mutation(s) within the areas targeted by this assay, and inadequate number of viral copies(<138 copies/mL). A negative result must be combined with clinical observations, patient history, and epidemiological information. The expected result is Negative.  Fact Sheet for Patients:  EntrepreneurPulse.com.au  Fact Sheet for  Healthcare Providers:  IncredibleEmployment.be  This test is no t yet approved or cleared by the [place] FDA and  has been authorized for detection and/or diagnosis of SARS-CoV-2 by FDA under an Emergency Use Authorization (EUA). This EUA will remain  in effect (meaning this test can be used) for the duration of the COVID-19 declaration under Section 564(b)(1) of the Act, 21 U.S.C.section 360bbb-3(b)(1), unless the authorization is terminated  or revoked sooner.       Influenza A by PCR NEGATIVE NEGATIVE Final   Influenza B by PCR NEGATIVE NEGATIVE Final    Comment: (NOTE) The Xpert Xpress SARS-CoV-2/FLU/RSV plus assay is intended as an aid in the diagnosis of influenza from Nasopharyngeal swab specimens and should not be used as a sole basis for treatment. Nasal washings and aspirates are unacceptable for Xpert Xpress SARS-CoV-2/FLU/RSV testing.  Fact Sheet for Patients: EntrepreneurPulse.com.au  Fact Sheet for Healthcare Providers: IncredibleEmployment.be  This test is not yet approved or cleared by the [place] FDA and has been authorized for detection and/or diagnosis of SARS-CoV-2 by FDA under an Emergency Use Authorization (EUA). This EUA will remain in effect (meaning this test can be used) for the duration of the COVID-19 declaration under Section 564(b)(1) of the Act, 21 U.S.C. section 360bbb-3(b)(1), unless the authorization is terminated or revoked.  Performed at [facility], [address] [place], [place] [address]      Labs: BNP (last 3 results) No results for input(s): BNP in the last 8760 hours. Basic Metabolic Panel: Recent Labs  Lab 04/20/21 1510 04/21/21 0606 04/23/21 0552  NA 140 139 140  K 4.8 4.5 4.5  CL 104 103 102  CO2 26 29 33*  GLUCOSE 137* 108* 95  BUN 27* 24* 20  CREATININE 1.22 1.10 0.99  CALCIUM 8.8* 8.5* 8.4*   Liver Function Tests: Recent Labs  Lab  04/20/21 1510  AST 24  ALT 23  ALKPHOS 94  BILITOT 0.8  PROT 6.1*  ALBUMIN 3.1*   No results for input(s): LIPASE, AMYLASE in the last 168 hours. No results for input(s): AMMONIA in the last 168 hours. CBC: Recent Labs  Lab 04/20/21 1510 04/21/21 0606 04/22/21 0612 04/23/21 0552  WBC 11.5* 9.3 9.5 7.9  HGB 15.0 14.5 13.7 13.4  HCT 44.3 44.1 43.1 42.5  MCV 98.2 98.9 99.8 99.3  PLT 160 149* 152 167   Cardiac Enzymes: No results for input(s): CKTOTAL, CKMB, CKMBINDEX, TROPONINI in the last 168 hours. BNP: Invalid input(s): POCBNP CBG: No results for input(s): GLUCAP in the last 168 hours. D-Dimer No results for input(s): DDIMER in the last 72 hours. Hgb A1c No results for input(s): HGBA1C in the last 72 hours. Lipid Profile No results for input(s): CHOL, HDL, LDLCALC, TRIG, CHOLHDL, LDLDIRECT in the last 72 hours. Thyroid function studies No results for input(s): TSH, T4TOTAL, T3FREE,  THYROIDAB in the last 72 hours.  Invalid input(s): FREET3 Anemia work up No results for input(s): VITAMINB12, FOLATE, FERRITIN, TIBC, IRON, RETICCTPCT in the last 72 hours. Urinalysis    Component Value Date/Time   COLORURINE YELLOW (A) 07/29/2015 1003   APPEARANCEUR Cloudy (A) 01/21/2020 1104   LABSPEC 1.023 07/29/2015 1003   PHURINE 5.0 07/29/2015 1003   GLUCOSEU Negative 01/21/2020 1104   GLUCOSEU NEGATIVE 12/05/2012 1416   HGBUR NEGATIVE 07/29/2015 1003   BILIRUBINUR Negative 01/21/2020 1104   KETONESUR NEGATIVE 07/29/2015 1003   PROTEINUR Negative 01/21/2020 1104   PROTEINUR NEGATIVE 07/29/2015 1003   UROBILINOGEN 0.2 12/05/2012 1416   NITRITE Positive (A) 01/21/2020 1104   NITRITE NEGATIVE 07/29/2015 1003   LEUKOCYTESUR Trace (A) 01/21/2020 1104   Sepsis Labs Invalid input(s): PROCALCITONIN,  WBC,  LACTICIDVEN Microbiology Recent Results (from the past 240 hour(s))  Resp Panel by RT-PCR (Flu A&B, Covid) Nasopharyngeal Swab     Status: None   Collection Time: 04/20/21   3:30 PM   Specimen: Nasopharyngeal Swab; Nasopharyngeal(NP) swabs in vial transport medium  Result Value Ref Range Status   SARS Coronavirus 2 by RT PCR NEGATIVE NEGATIVE Final    Comment: (NOTE) SARS-CoV-2 target nucleic acids are NOT DETECTED.  The SARS-CoV-2 RNA is generally detectable in upper respiratory specimens during the acute phase of infection. The lowest concentration of SARS-CoV-2 viral copies this assay can detect is 138 copies/mL. A negative result does not preclude SARS-Cov-2 infection and should not be used as the sole basis for treatment or other patient management decisions. A negative result may occur with  improper specimen collection/handling, submission of specimen other than nasopharyngeal swab, presence of viral mutation(s) within the areas targeted by this assay, and inadequate number of viral copies(<138 copies/mL). A negative result must be combined with clinical observations, patient history, and epidemiological information. The expected result is Negative.  Fact Sheet for Patients:  EntrepreneurPulse.com.au  Fact Sheet for Healthcare Providers:  IncredibleEmployment.be  This test is no t yet approved or cleared by the [place] FDA and  has been authorized for detection and/or diagnosis of SARS-CoV-2 by FDA under an Emergency Use Authorization (EUA). This EUA will remain  in effect (meaning this test can be used) for the duration of the COVID-19 declaration under Section 564(b)(1) of the Act, 21 U.S.C.section 360bbb-3(b)(1), unless the authorization is terminated  or revoked sooner.       Influenza A by PCR NEGATIVE NEGATIVE Final   Influenza B by PCR NEGATIVE NEGATIVE Final    Comment: (NOTE) The Xpert Xpress SARS-CoV-2/FLU/RSV plus assay is intended as an aid in the diagnosis of influenza from Nasopharyngeal swab specimens and should not be used as a sole basis for treatment. Nasal washings and aspirates  are unacceptable for Xpert Xpress SARS-CoV-2/FLU/RSV testing.  Fact Sheet for Patients: EntrepreneurPulse.com.au  Fact Sheet for Healthcare Providers: IncredibleEmployment.be  This test is not yet approved or cleared by the [place] FDA and has been authorized for detection and/or diagnosis of SARS-CoV-2 by FDA under an Emergency Use Authorization (EUA). This EUA will remain in effect (meaning this test can be used) for the duration of the COVID-19 declaration under Section 564(b)(1) of the Act, 21 U.S.C. section 360bbb-3(b)(1), unless the authorization is terminated or revoked.  Performed at [facility], [address] [place], [place] [address]      Time coordinating discharge: Over 30 minutes  SIGNED:   Sidney Ace, MD  [facility] 04/23/2021, 12:13 PM Pager  If 7PM-7AM, please contact night-coverage

## 2021-04-23 NOTE — NC FL2 (Signed)
[place] LEVEL OF CARE SCREENING TOOL     IDENTIFICATION  Patient Name: Christopher Jenkins Birthdate: [birth year] Sex: male Admission Date (Current Location): 04/20/2021  [facility] and [place] Number:  Engineering geologist and Address:  [facility], [address] [place], [place] [address]      Provider Number: [id number]  Attending Physician Name and Address:  Sidney Ace, MD  Relative Name and Phone Number:  [name] (spouse) [phone]    Current Level of Care: Hospital Recommended Level of Care: [place] Prior Approval Number:    Date Approved/Denied:   PASRR Number: [id number]  Discharge Plan: SNF    Current Diagnoses: Patient Active Problem List   Diagnosis Date Noted   Acute pulmonary embolism ([place]) 04/20/2021   Chronic venous insufficiency 02/04/2021   Aortic atherosclerosis ([place]) 08/06/2020   BPH with obstruction/lower urinary tract symptoms 03/12/2015   Constipation 05/07/2014   OSA (obstructive sleep apnea) 02/24/2013   Alzheimer's dementia without behavioral disturbance ([place]) 06/10/2012   Morbid obesity ([place]) 12/17/2011   Hyperlipidemia    Hypertension     Orientation RESPIRATION BLADDER Height & Weight     Self, Time, Place  O2 (2L O2) Incontinent Weight: 246 lb (111.6 kg) Height:  5\' 8"  (172.7 cm)  BEHAVIORAL SYMPTOMS/MOOD NEUROLOGICAL BOWEL NUTRITION STATUS      Continent Diet (see discharge summary)  AMBULATORY STATUS COMMUNICATION OF NEEDS Skin   Limited Assist Verbally Other (Comment) (abrasion on elbow)                       Personal Care Assistance Level of Assistance  Bathing, Feeding, Dressing, Total care Bathing Assistance: Independent Feeding assistance: Independent Dressing Assistance: Independent Total Care Assistance: Independent   Functional Limitations Info  Sight, Hearing, Speech Sight Info: Adequate Hearing Info: Impaired Speech Info: Adequate    SPECIAL  CARE FACTORS FREQUENCY  PT (By licensed PT), OT (By licensed OT)     PT Frequency: min 4x weekly OT Frequency: min 4x weekly            Contractures Contractures Info: Not present    Additional Factors Info  Code Status, Allergies Code Status Info: DNR Allergies Info: No Known Allergies           Current Medications (04/23/2021):  This is the current hospital active medication list Current Facility-Administered Medications  Medication Dose Route Frequency Provider Last Rate Last Admin   0.9 %  sodium chloride infusion  250 mL Intravenous PRN [name], MD       acetaminophen (TYLENOL) tablet 650 mg  650 mg Oral Q6H PRN [name], MD       Or   acetaminophen (TYLENOL) suppository 650 mg  650 mg Rectal Q6H PRN [name], MD       apixaban [facility]) tablet 10 mg  10 mg Oral BID Ralene Muskrat B, MD   10 mg at 04/23/21 0908   Followed by   Derrill Memo ON 04/29/2021] apixaban (ELIQUIS) tablet 5 mg  5 mg Oral BID Sreenath, Sudheer B, MD       docusate sodium (COLACE) capsule 200 mg  200 mg Oral QHS [name], MD   200 mg at 04/22/21 2153   donepezil (ARICEPT) tablet 5 mg  5 mg Oral QHS [name], MD   5 mg at 04/22/21 2153   finasteride (PROSCAR) tablet 5 mg  5 mg Oral Daily [name],  MD   5 mg at 04/23/21 0908   furosemide (LASIX) tablet 40 mg  40 mg Oral q morning [name], MD   40 mg at 04/23/21 0947   hydrALAZINE (APRESOLINE) injection 10 mg  10 mg Intravenous Q4H PRN Ralene Muskrat B, MD       multivitamin with minerals tablet 1 tablet  1 tablet Oral Daily [name], MD   1 tablet at 04/23/21 0908   ondansetron (ZOFRAN) tablet 4 mg  4 mg Oral Q6H PRN [name], MD       Or   ondansetron (ZOFRAN) injection 4 mg  4 mg Intravenous Q6H PRN [name], MD       potassium chloride SA (KLOR-CON) CR tablet 20 mEq  20 mEq Oral Daily [name], MD   20 mEq at 04/23/21 0908   sodium chloride flush (NS) 0.9 % injection 3 mL  3 mL Intravenous Q12H [name], MD   3 mL at 04/23/21 0908   sodium chloride flush (NS) 0.9 % injection 3 mL  3 mL Intravenous PRN [name], MD       tamsulosin [facility]) capsule 0.4 mg  0.4 mg Oral Daily [name], MD   0.4 mg at 04/23/21 0908     Discharge Medications: Please see discharge summary for a list of discharge medications.  Relevant Imaging Results:  Relevant Lab Results:   Additional Information SSN: [id number]  Alberteen Sam, LCSW

## 2021-04-23 NOTE — Care Management Important Message (Signed)
Important Message  Patient Details  Name: Christopher Jenkins MRN: [id number] Date of Birth: Jul 24, 1926   Medicare Important Message Given:  Yes     Dannette Barbara 04/23/2021, 1:26 PM

## 2021-04-23 NOTE — Progress Notes (Signed)
SATURATION QUALIFICATIONS: (This note is used to comply with regulatory documentation for home oxygen)  Patient Saturations on Room Air at Rest = 93%  Patient Saturations on Room Air while Ambulating = 84%  Patient Saturations on 2 Liters of oxygen while Ambulating = 91%

## 2021-04-29 DIAGNOSIS — R21 Rash and other nonspecific skin eruption: Secondary | ICD-10-CM | POA: Diagnosis not present

## 2021-05-04 ENCOUNTER — Other Ambulatory Visit: Payer: Self-pay | Admitting: Internal Medicine

## 2021-05-05 DIAGNOSIS — R278 Other lack of coordination: Secondary | ICD-10-CM | POA: Diagnosis not present

## 2021-05-05 DIAGNOSIS — G308 Other Alzheimer's disease: Secondary | ICD-10-CM | POA: Diagnosis not present

## 2021-05-05 DIAGNOSIS — I509 Heart failure, unspecified: Secondary | ICD-10-CM | POA: Diagnosis not present

## 2021-05-05 DIAGNOSIS — R4189 Other symptoms and signs involving cognitive functions and awareness: Secondary | ICD-10-CM | POA: Diagnosis not present

## 2021-05-05 DIAGNOSIS — G309 Alzheimer's disease, unspecified: Secondary | ICD-10-CM | POA: Diagnosis not present

## 2021-05-05 DIAGNOSIS — Z741 Need for assistance with personal care: Secondary | ICD-10-CM | POA: Diagnosis not present

## 2021-05-05 DIAGNOSIS — J9601 Acute respiratory failure with hypoxia: Secondary | ICD-10-CM | POA: Diagnosis not present

## 2021-05-05 DIAGNOSIS — I2609 Other pulmonary embolism with acute cor pulmonale: Secondary | ICD-10-CM | POA: Diagnosis not present

## 2021-05-05 DIAGNOSIS — M6281 Muscle weakness (generalized): Secondary | ICD-10-CM | POA: Diagnosis not present

## 2021-05-06 ENCOUNTER — Encounter: Payer: Self-pay | Admitting: Internal Medicine

## 2021-05-06 DIAGNOSIS — J9601 Acute respiratory failure with hypoxia: Secondary | ICD-10-CM | POA: Diagnosis not present

## 2021-05-06 DIAGNOSIS — I10 Essential (primary) hypertension: Secondary | ICD-10-CM | POA: Diagnosis not present

## 2021-05-06 DIAGNOSIS — G308 Other Alzheimer's disease: Secondary | ICD-10-CM | POA: Diagnosis not present

## 2021-05-06 DIAGNOSIS — E785 Hyperlipidemia, unspecified: Secondary | ICD-10-CM | POA: Diagnosis not present

## 2021-05-06 DIAGNOSIS — I2609 Other pulmonary embolism with acute cor pulmonale: Secondary | ICD-10-CM | POA: Diagnosis not present

## 2021-05-06 DIAGNOSIS — I509 Heart failure, unspecified: Secondary | ICD-10-CM | POA: Diagnosis not present

## 2021-05-06 DIAGNOSIS — M6281 Muscle weakness (generalized): Secondary | ICD-10-CM | POA: Diagnosis not present

## 2021-05-06 DIAGNOSIS — I739 Peripheral vascular disease, unspecified: Secondary | ICD-10-CM | POA: Diagnosis not present

## 2021-05-06 DIAGNOSIS — G309 Alzheimer's disease, unspecified: Secondary | ICD-10-CM | POA: Diagnosis not present

## 2021-05-07 ENCOUNTER — Other Ambulatory Visit: Payer: Self-pay | Admitting: Internal Medicine

## 2021-05-07 ENCOUNTER — Non-Acute Institutional Stay: Payer: Medicare Other | Admitting: Internal Medicine

## 2021-05-07 DIAGNOSIS — N138 Other obstructive and reflux uropathy: Secondary | ICD-10-CM

## 2021-05-07 DIAGNOSIS — I10 Essential (primary) hypertension: Secondary | ICD-10-CM | POA: Diagnosis not present

## 2021-05-07 DIAGNOSIS — N401 Enlarged prostate with lower urinary tract symptoms: Secondary | ICD-10-CM | POA: Diagnosis not present

## 2021-05-07 DIAGNOSIS — G309 Alzheimer's disease, unspecified: Secondary | ICD-10-CM

## 2021-05-07 DIAGNOSIS — F0280 Dementia in other diseases classified elsewhere without behavioral disturbance: Secondary | ICD-10-CM

## 2021-05-07 DIAGNOSIS — E782 Mixed hyperlipidemia: Secondary | ICD-10-CM

## 2021-05-07 DIAGNOSIS — I2602 Saddle embolus of pulmonary artery with acute cor pulmonale: Secondary | ICD-10-CM

## 2021-05-07 DIAGNOSIS — I700 Atherosclerosis of aorta: Secondary | ICD-10-CM | POA: Diagnosis not present

## 2021-05-07 DIAGNOSIS — I872 Venous insufficiency (chronic) (peripheral): Secondary | ICD-10-CM | POA: Diagnosis not present

## 2021-05-07 DIAGNOSIS — G4733 Obstructive sleep apnea (adult) (pediatric): Secondary | ICD-10-CM | POA: Diagnosis not present

## 2021-05-10 DIAGNOSIS — I2609 Other pulmonary embolism with acute cor pulmonale: Secondary | ICD-10-CM | POA: Diagnosis not present

## 2021-05-10 DIAGNOSIS — M6281 Muscle weakness (generalized): Secondary | ICD-10-CM | POA: Diagnosis not present

## 2021-05-10 DIAGNOSIS — J9601 Acute respiratory failure with hypoxia: Secondary | ICD-10-CM | POA: Diagnosis not present

## 2021-05-10 DIAGNOSIS — G308 Other Alzheimer's disease: Secondary | ICD-10-CM | POA: Diagnosis not present

## 2021-05-10 DIAGNOSIS — G309 Alzheimer's disease, unspecified: Secondary | ICD-10-CM | POA: Diagnosis not present

## 2021-05-10 DIAGNOSIS — I509 Heart failure, unspecified: Secondary | ICD-10-CM | POA: Diagnosis not present

## 2021-05-11 ENCOUNTER — Other Ambulatory Visit: Payer: Self-pay | Admitting: Internal Medicine

## 2021-05-12 DIAGNOSIS — I2609 Other pulmonary embolism with acute cor pulmonale: Secondary | ICD-10-CM | POA: Diagnosis not present

## 2021-05-12 DIAGNOSIS — G308 Other Alzheimer's disease: Secondary | ICD-10-CM | POA: Diagnosis not present

## 2021-05-12 DIAGNOSIS — J9601 Acute respiratory failure with hypoxia: Secondary | ICD-10-CM | POA: Diagnosis not present

## 2021-05-12 DIAGNOSIS — I509 Heart failure, unspecified: Secondary | ICD-10-CM | POA: Diagnosis not present

## 2021-05-12 DIAGNOSIS — G309 Alzheimer's disease, unspecified: Secondary | ICD-10-CM | POA: Diagnosis not present

## 2021-05-12 DIAGNOSIS — M6281 Muscle weakness (generalized): Secondary | ICD-10-CM | POA: Diagnosis not present

## 2021-05-17 DIAGNOSIS — M6281 Muscle weakness (generalized): Secondary | ICD-10-CM | POA: Diagnosis not present

## 2021-05-17 DIAGNOSIS — G308 Other Alzheimer's disease: Secondary | ICD-10-CM | POA: Diagnosis not present

## 2021-05-17 DIAGNOSIS — I2609 Other pulmonary embolism with acute cor pulmonale: Secondary | ICD-10-CM | POA: Diagnosis not present

## 2021-05-17 DIAGNOSIS — I509 Heart failure, unspecified: Secondary | ICD-10-CM | POA: Diagnosis not present

## 2021-05-17 DIAGNOSIS — G309 Alzheimer's disease, unspecified: Secondary | ICD-10-CM | POA: Diagnosis not present

## 2021-05-17 DIAGNOSIS — J9601 Acute respiratory failure with hypoxia: Secondary | ICD-10-CM | POA: Diagnosis not present

## 2021-05-19 DIAGNOSIS — G308 Other Alzheimer's disease: Secondary | ICD-10-CM | POA: Diagnosis not present

## 2021-05-19 DIAGNOSIS — I509 Heart failure, unspecified: Secondary | ICD-10-CM | POA: Diagnosis not present

## 2021-05-19 DIAGNOSIS — I2609 Other pulmonary embolism with acute cor pulmonale: Secondary | ICD-10-CM | POA: Diagnosis not present

## 2021-05-19 DIAGNOSIS — M6281 Muscle weakness (generalized): Secondary | ICD-10-CM | POA: Diagnosis not present

## 2021-05-19 DIAGNOSIS — G309 Alzheimer's disease, unspecified: Secondary | ICD-10-CM | POA: Diagnosis not present

## 2021-05-19 DIAGNOSIS — J9601 Acute respiratory failure with hypoxia: Secondary | ICD-10-CM | POA: Diagnosis not present

## 2021-05-20 ENCOUNTER — Encounter: Payer: Self-pay | Admitting: Internal Medicine

## 2021-05-20 DIAGNOSIS — Z741 Need for assistance with personal care: Secondary | ICD-10-CM | POA: Diagnosis not present

## 2021-05-20 DIAGNOSIS — M6281 Muscle weakness (generalized): Secondary | ICD-10-CM | POA: Diagnosis not present

## 2021-05-20 DIAGNOSIS — R4189 Other symptoms and signs involving cognitive functions and awareness: Secondary | ICD-10-CM | POA: Diagnosis not present

## 2021-05-20 DIAGNOSIS — G308 Other Alzheimer's disease: Secondary | ICD-10-CM | POA: Diagnosis not present

## 2021-05-20 DIAGNOSIS — I2609 Other pulmonary embolism with acute cor pulmonale: Secondary | ICD-10-CM | POA: Diagnosis not present

## 2021-05-20 DIAGNOSIS — R278 Other lack of coordination: Secondary | ICD-10-CM | POA: Diagnosis not present

## 2021-05-20 DIAGNOSIS — I509 Heart failure, unspecified: Secondary | ICD-10-CM | POA: Diagnosis not present

## 2021-05-20 DIAGNOSIS — G309 Alzheimer's disease, unspecified: Secondary | ICD-10-CM | POA: Diagnosis not present

## 2021-05-20 DIAGNOSIS — J9601 Acute respiratory failure with hypoxia: Secondary | ICD-10-CM | POA: Diagnosis not present

## 2021-05-20 NOTE — Progress Notes (Signed)
Subjective:    Patient ID: Christopher Jenkins, male    DOB: October 10, 1926, 87 y.o.   MRN: [id number]  HPI  Resident seen in [address] Reviewed with RN. Recent ER admission 11/1 for low heart rate and decreased sats. CTA chest showed bilateral PE with right heart strain. Vascular was consulted, he underwent thrombectomy on 11/2. His Bysyolic was discontinued due to bradycardia. It was recommended he continue oxygen for 6-12 months. He was started on Eliquis. He was discharged 11/4. Since discharge, he reports he has been feeling well. He denies chest pain or shortness of breath.   He reports he is sleeping well. His appetite is good. His weight is 238 lbs (down 8 lbs). He is independent with ADL's. He walks without device. He denies urine leakage. His bowels are moving normally. H e denies joint pain, reflux. He reports his mood is good.  Alzheimer's: Mild cognitive, stable functional needs. He is taking Donepezil as prescribed.   BPH: Mainly urinary frequency. He is taking Flomax and Finasteride as prescribed.   CVI: He reports some swelling in his lower extremities. He is taking Furosemide and Potassium as prescribed.   HLD with Aortic Atherosclerosis: his last LDL was 47, triglycerides 160, 03/2020. He is not taking any cholesterol medication at this time. He is taking Eliquis as prescribed.  HTN: His BP today is 156/70. He is taking Furosemide as prescribed. ECG from 11/1 reviewed.   OSA: He is sleeping well with the use of his CPAP. He is napping less during the day.  Review of Systems     Past Medical History:  Diagnosis Date   BPH (benign prostatic hyperplasia)    Duodenal mass    Elevated PSA    Gross hematuria    Hyperlipidemia    Hypertension    Hypertrophy (benign) of prostate    asymptomatic on finasteride. will f/up with urologist [name]   Kidney lesion 07/2015   pt has appt at kidney specialist 08/05/15 to review CT scan.   Obesity    Overweight    Tubular adenoma  of colon    Urinary retention     Current Outpatient Medications  Medication Sig Dispense Refill   apixaban (ELIQUIS) 5 MG TABS tablet TAKE ONE TABLET BY MOUTH TWICE DAILY 60 tablet 11   docusate sodium (COLACE) 100 MG capsule Take 2 capsules (200 mg total) by mouth at bedtime. 60 capsule 11   Docusate Sodium (DOCU) 150 MG/15ML syrup PLACE 3 DROPS IN EACH EAR DAILY TO PREVENT OCCLUSION WITH WAX 100 mL 0   donepezil (ARICEPT) 5 MG tablet Take 5 mg by mouth at bedtime.     finasteride (PROSCAR) 5 MG tablet TAKE 1 TABLET BY MOUTH DAILY. 90 tablet 3   furosemide (LASIX) 20 MG tablet Take 2 tablets (40 mg total) by mouth every morning. 180 tablet 1   Multiple Vitamins-Minerals (MULTIVITAMIN WITH MINERALS) tablet Take 1 tablet by mouth daily. At 1400     potassium chloride SA (KLOR-CON) 20 MEQ tablet TAKE 1 TABLET BY MOUTH DAILY 30 tablet 11   tamsulosin (FLOMAX) 0.4 MG CAPS capsule TAKE 1 CAPSULE BY MOUTH DAILY. 90 capsule 3   No current facility-administered medications for this visit.    Allergies  Allergen Reactions   No Known Allergies     Family History  Problem Relation Age of Onset   Heart disease Mother    Stroke Mother    Heart disease Father    Kidney disease Neg  Hx    Prostate cancer Neg Hx    Kidney cancer Neg Hx    Bladder Cancer Neg Hx     Social History   Socioeconomic History   Marital status: Married    Spouse name: Not on file   Number of children: 3   Years of education: Not on file   Highest education level: Not on file  Occupational History   Occupation: Retired  Tobacco Use   Smoking status: Never   Smokeless tobacco: Never  Substance and Sexual Activity   Alcohol use: No    Comment: Occasional glass of wine 3-4 times a year.   Drug use: No   Sexual activity: Never  Other Topics Concern   Not on file  Social History Narrative   2 sons, 1 daughter      Living will?   Sons/daughter would be medical decision makers   Has DNR   No tube feeds  if cognitively unaware   Social Determinants of Health   Financial Resource Strain: Not on file  Food Insecurity: Not on file  Transportation Needs: Not on file  Physical Activity: Not on file  Stress: Not on file  Social Connections: Not on file  Intimate Partner Violence: Not on file     Constitutional: Denies fever, malaise, fatigue, headache or abrupt weight changes.  Respiratory: Denies difficulty breathing, shortness of breath, cough or sputum production.   Cardiovascular: Pt reports swelling in legs. Denies chest pain, chest tightness, palpitations or swelling in the hands.  Gastrointestinal: Denies abdominal pain, bloating, constipation, diarrhea or blood in the stool.  GU: Pt reports urinary frequency. Denies urgency, pain with urination, burning sensation, blood in urine, odor or discharge. Musculoskeletal: Denies decrease in range of motion, difficulty with gait, muscle pain or joint pain and swelling.  Skin: Denies redness, rashes, lesions or ulcercations.  Neurological: Pt reports difficulty with memory. Denies dizziness, difficulty with speech or problems with balance and coordination.  Psych: Denies anxiety, depression, SI/HI.  No other specific complaints in a complete review of systems (except as listed in HPI above).  Objective:   Physical Exam   BP (!) 156/70   Pulse (!) 50   Temp 98.4 F (36.9 C)   Resp 20   Wt 238 lb (108 kg)   SpO2 99%   BMI 36.19 kg/m  Wt Readings from Last 3 Encounters:  05/20/21 238 lb (108 kg)  04/21/21 246 lb (111.6 kg)  03/27/21 236 lb 11.2 oz (107.4 kg)    General: Appears his stated age, obese, in NAD. Skin: Warm, dry and intact. No rashes, lesions or ulcerations noted. Cardiovascular: Bradycardic with normal rhythm. S1,S2 noted.  No murmur, rubs or gallops noted. 1+ pitting BLE edema.  Pulmonary/Chest: Normal effort and positive vesicular breath sounds. No respiratory distress. No wheezes, rales or ronchi noted.   Abdomen: Soft and nontender.  Neurological: Alert and oriented. Cranial nerves II-XII grossly intact. Coordination normal.  Psychiatric: Mood and affect normal.   BMET    Component Value Date/Time   NA 140 04/23/2021 0552   NA 140 05/08/2014 0000   K 4.5 04/23/2021 0552   CL 102 04/23/2021 0552   CO2 33 (H) 04/23/2021 0552   GLUCOSE 95 04/23/2021 0552   BUN 20 04/23/2021 0552   BUN 24 04/30/2015 1401   CREATININE 0.99 04/23/2021 0552   CREATININE 0.94 04/17/2020 1617   CALCIUM 8.4 (L) 04/23/2021 0552   GFRNONAA >60 04/23/2021 0552   GFRNONAA 75 12/16/2011 1610  GFRAA >60 08/15/2015 0418   GFRAA 87 12/16/2011 0842    Lipid Panel     Component Value Date/Time   CHOL 109 04/17/2020 1617   TRIG 160 (H) 04/17/2020 1617   HDL 38 (L) 04/17/2020 1617   CHOLHDL 2.9 04/17/2020 1617   VLDL 48.6 (H) 04/12/2019 1428   LDLCALC 47 04/17/2020 1617    CBC    Component Value Date/Time   WBC 7.9 04/23/2021 0552   RBC 4.28 04/23/2021 0552   HGB 13.4 04/23/2021 0552   HCT 42.5 04/23/2021 0552   PLT 167 04/23/2021 0552   MCV 99.3 04/23/2021 0552   MCH 31.3 04/23/2021 0552   MCHC 31.5 04/23/2021 0552   RDW 12.8 04/23/2021 0552   LYMPHSABS 1.1 09/01/2017 1210   MONOABS 0.9 09/01/2017 1210   EOSABS 0.1 09/01/2017 1210   BASOSABS 0.1 09/01/2017 1210    Hgb A1C Lab Results  Component Value Date   HGBA1C 5.5 04/12/2019           Assessment & Plan:   Hospital Follow up for PE, Bilateral:  Hospital notes, labs and imaging reviewed He will continue Eliquis He is not currently on oxygen No further intervention needed at this time  Will reassess as needed  Webb Silversmith, NP This visit occurred during the SARS-CoV-2 public health emergency.  Safety protocols were in place, including screening questions prior to the visit, additional usage of staff PPE, and extensive cleaning of exam room while observing appropriate contact time as indicated for disinfecting solutions.

## 2021-05-20 NOTE — Assessment & Plan Note (Signed)
Controlled off meds  Will monitor 

## 2021-05-20 NOTE — Assessment & Plan Note (Signed)
Continue Flomax and Finasteride

## 2021-05-20 NOTE — Assessment & Plan Note (Signed)
Not on statin given age Continue Eliquis 

## 2021-05-20 NOTE — Assessment & Plan Note (Signed)
Continue CPAP.  

## 2021-05-20 NOTE — Assessment & Plan Note (Signed)
Not on statin given age

## 2021-05-20 NOTE — Assessment & Plan Note (Signed)
Will continue Eliquis for at least 9 months

## 2021-05-20 NOTE — Assessment & Plan Note (Signed)
Stable on his current dose of Donepezil Appreciate ALF care

## 2021-05-20 NOTE — Assessment & Plan Note (Signed)
Stable on Furosemide and potassium Encourage elevation

## 2021-05-20 NOTE — Assessment & Plan Note (Signed)
Encouraged diet for weight loss 

## 2021-05-21 DIAGNOSIS — G308 Other Alzheimer's disease: Secondary | ICD-10-CM | POA: Diagnosis not present

## 2021-05-21 DIAGNOSIS — J9601 Acute respiratory failure with hypoxia: Secondary | ICD-10-CM | POA: Diagnosis not present

## 2021-05-21 DIAGNOSIS — I2609 Other pulmonary embolism with acute cor pulmonale: Secondary | ICD-10-CM | POA: Diagnosis not present

## 2021-05-21 DIAGNOSIS — G309 Alzheimer's disease, unspecified: Secondary | ICD-10-CM | POA: Diagnosis not present

## 2021-05-21 DIAGNOSIS — I509 Heart failure, unspecified: Secondary | ICD-10-CM | POA: Diagnosis not present

## 2021-05-21 DIAGNOSIS — M6281 Muscle weakness (generalized): Secondary | ICD-10-CM | POA: Diagnosis not present

## 2021-05-24 DIAGNOSIS — J9601 Acute respiratory failure with hypoxia: Secondary | ICD-10-CM | POA: Diagnosis not present

## 2021-05-24 DIAGNOSIS — I509 Heart failure, unspecified: Secondary | ICD-10-CM | POA: Diagnosis not present

## 2021-05-24 DIAGNOSIS — I2609 Other pulmonary embolism with acute cor pulmonale: Secondary | ICD-10-CM | POA: Diagnosis not present

## 2021-05-24 DIAGNOSIS — M6281 Muscle weakness (generalized): Secondary | ICD-10-CM | POA: Diagnosis not present

## 2021-05-24 DIAGNOSIS — G309 Alzheimer's disease, unspecified: Secondary | ICD-10-CM | POA: Diagnosis not present

## 2021-05-24 DIAGNOSIS — G308 Other Alzheimer's disease: Secondary | ICD-10-CM | POA: Diagnosis not present

## 2021-05-25 DIAGNOSIS — G308 Other Alzheimer's disease: Secondary | ICD-10-CM | POA: Diagnosis not present

## 2021-05-25 DIAGNOSIS — J9601 Acute respiratory failure with hypoxia: Secondary | ICD-10-CM | POA: Diagnosis not present

## 2021-05-25 DIAGNOSIS — Z20822 Contact with and (suspected) exposure to covid-19: Secondary | ICD-10-CM | POA: Diagnosis not present

## 2021-05-25 DIAGNOSIS — G309 Alzheimer's disease, unspecified: Secondary | ICD-10-CM | POA: Diagnosis not present

## 2021-05-25 DIAGNOSIS — M6281 Muscle weakness (generalized): Secondary | ICD-10-CM | POA: Diagnosis not present

## 2021-05-25 DIAGNOSIS — I2609 Other pulmonary embolism with acute cor pulmonale: Secondary | ICD-10-CM | POA: Diagnosis not present

## 2021-05-25 DIAGNOSIS — I509 Heart failure, unspecified: Secondary | ICD-10-CM | POA: Diagnosis not present

## 2021-05-26 DIAGNOSIS — I509 Heart failure, unspecified: Secondary | ICD-10-CM | POA: Diagnosis not present

## 2021-05-26 DIAGNOSIS — G309 Alzheimer's disease, unspecified: Secondary | ICD-10-CM | POA: Diagnosis not present

## 2021-05-26 DIAGNOSIS — G308 Other Alzheimer's disease: Secondary | ICD-10-CM | POA: Diagnosis not present

## 2021-05-26 DIAGNOSIS — I2609 Other pulmonary embolism with acute cor pulmonale: Secondary | ICD-10-CM | POA: Diagnosis not present

## 2021-05-26 DIAGNOSIS — M6281 Muscle weakness (generalized): Secondary | ICD-10-CM | POA: Diagnosis not present

## 2021-05-26 DIAGNOSIS — J9601 Acute respiratory failure with hypoxia: Secondary | ICD-10-CM | POA: Diagnosis not present

## 2021-05-27 DIAGNOSIS — I2609 Other pulmonary embolism with acute cor pulmonale: Secondary | ICD-10-CM | POA: Diagnosis not present

## 2021-05-27 DIAGNOSIS — G308 Other Alzheimer's disease: Secondary | ICD-10-CM | POA: Diagnosis not present

## 2021-05-27 DIAGNOSIS — J9601 Acute respiratory failure with hypoxia: Secondary | ICD-10-CM | POA: Diagnosis not present

## 2021-05-27 DIAGNOSIS — I509 Heart failure, unspecified: Secondary | ICD-10-CM | POA: Diagnosis not present

## 2021-05-27 DIAGNOSIS — G309 Alzheimer's disease, unspecified: Secondary | ICD-10-CM | POA: Diagnosis not present

## 2021-05-27 DIAGNOSIS — M6281 Muscle weakness (generalized): Secondary | ICD-10-CM | POA: Diagnosis not present

## 2021-05-28 DIAGNOSIS — M6281 Muscle weakness (generalized): Secondary | ICD-10-CM | POA: Diagnosis not present

## 2021-05-28 DIAGNOSIS — I509 Heart failure, unspecified: Secondary | ICD-10-CM | POA: Diagnosis not present

## 2021-05-28 DIAGNOSIS — G308 Other Alzheimer's disease: Secondary | ICD-10-CM | POA: Diagnosis not present

## 2021-05-28 DIAGNOSIS — I2609 Other pulmonary embolism with acute cor pulmonale: Secondary | ICD-10-CM | POA: Diagnosis not present

## 2021-05-28 DIAGNOSIS — J9601 Acute respiratory failure with hypoxia: Secondary | ICD-10-CM | POA: Diagnosis not present

## 2021-05-28 DIAGNOSIS — G309 Alzheimer's disease, unspecified: Secondary | ICD-10-CM | POA: Diagnosis not present

## 2021-05-28 DIAGNOSIS — Z20822 Contact with and (suspected) exposure to covid-19: Secondary | ICD-10-CM | POA: Diagnosis not present

## 2021-05-31 DIAGNOSIS — G308 Other Alzheimer's disease: Secondary | ICD-10-CM | POA: Diagnosis not present

## 2021-05-31 DIAGNOSIS — M6281 Muscle weakness (generalized): Secondary | ICD-10-CM | POA: Diagnosis not present

## 2021-05-31 DIAGNOSIS — G309 Alzheimer's disease, unspecified: Secondary | ICD-10-CM | POA: Diagnosis not present

## 2021-05-31 DIAGNOSIS — I509 Heart failure, unspecified: Secondary | ICD-10-CM | POA: Diagnosis not present

## 2021-05-31 DIAGNOSIS — I2609 Other pulmonary embolism with acute cor pulmonale: Secondary | ICD-10-CM | POA: Diagnosis not present

## 2021-05-31 DIAGNOSIS — J9601 Acute respiratory failure with hypoxia: Secondary | ICD-10-CM | POA: Diagnosis not present

## 2021-06-01 DIAGNOSIS — I2609 Other pulmonary embolism with acute cor pulmonale: Secondary | ICD-10-CM | POA: Diagnosis not present

## 2021-06-01 DIAGNOSIS — J9601 Acute respiratory failure with hypoxia: Secondary | ICD-10-CM | POA: Diagnosis not present

## 2021-06-01 DIAGNOSIS — M6281 Muscle weakness (generalized): Secondary | ICD-10-CM | POA: Diagnosis not present

## 2021-06-01 DIAGNOSIS — G309 Alzheimer's disease, unspecified: Secondary | ICD-10-CM | POA: Diagnosis not present

## 2021-06-01 DIAGNOSIS — G308 Other Alzheimer's disease: Secondary | ICD-10-CM | POA: Diagnosis not present

## 2021-06-01 DIAGNOSIS — I509 Heart failure, unspecified: Secondary | ICD-10-CM | POA: Diagnosis not present

## 2021-06-02 DIAGNOSIS — G308 Other Alzheimer's disease: Secondary | ICD-10-CM | POA: Diagnosis not present

## 2021-06-02 DIAGNOSIS — I509 Heart failure, unspecified: Secondary | ICD-10-CM | POA: Diagnosis not present

## 2021-06-02 DIAGNOSIS — M6281 Muscle weakness (generalized): Secondary | ICD-10-CM | POA: Diagnosis not present

## 2021-06-02 DIAGNOSIS — G309 Alzheimer's disease, unspecified: Secondary | ICD-10-CM | POA: Diagnosis not present

## 2021-06-02 DIAGNOSIS — I2609 Other pulmonary embolism with acute cor pulmonale: Secondary | ICD-10-CM | POA: Diagnosis not present

## 2021-06-02 DIAGNOSIS — J9601 Acute respiratory failure with hypoxia: Secondary | ICD-10-CM | POA: Diagnosis not present

## 2021-06-03 DIAGNOSIS — J9601 Acute respiratory failure with hypoxia: Secondary | ICD-10-CM | POA: Diagnosis not present

## 2021-06-03 DIAGNOSIS — I2609 Other pulmonary embolism with acute cor pulmonale: Secondary | ICD-10-CM | POA: Diagnosis not present

## 2021-06-03 DIAGNOSIS — I509 Heart failure, unspecified: Secondary | ICD-10-CM | POA: Diagnosis not present

## 2021-06-03 DIAGNOSIS — G308 Other Alzheimer's disease: Secondary | ICD-10-CM | POA: Diagnosis not present

## 2021-06-03 DIAGNOSIS — G309 Alzheimer's disease, unspecified: Secondary | ICD-10-CM | POA: Diagnosis not present

## 2021-06-03 DIAGNOSIS — M6281 Muscle weakness (generalized): Secondary | ICD-10-CM | POA: Diagnosis not present

## 2021-06-04 DIAGNOSIS — M6281 Muscle weakness (generalized): Secondary | ICD-10-CM | POA: Diagnosis not present

## 2021-06-04 DIAGNOSIS — I2609 Other pulmonary embolism with acute cor pulmonale: Secondary | ICD-10-CM | POA: Diagnosis not present

## 2021-06-04 DIAGNOSIS — I509 Heart failure, unspecified: Secondary | ICD-10-CM | POA: Diagnosis not present

## 2021-06-04 DIAGNOSIS — G308 Other Alzheimer's disease: Secondary | ICD-10-CM | POA: Diagnosis not present

## 2021-06-04 DIAGNOSIS — J9601 Acute respiratory failure with hypoxia: Secondary | ICD-10-CM | POA: Diagnosis not present

## 2021-06-04 DIAGNOSIS — G309 Alzheimer's disease, unspecified: Secondary | ICD-10-CM | POA: Diagnosis not present

## 2021-06-08 DIAGNOSIS — G308 Other Alzheimer's disease: Secondary | ICD-10-CM | POA: Diagnosis not present

## 2021-06-08 DIAGNOSIS — M6281 Muscle weakness (generalized): Secondary | ICD-10-CM | POA: Diagnosis not present

## 2021-06-08 DIAGNOSIS — G309 Alzheimer's disease, unspecified: Secondary | ICD-10-CM | POA: Diagnosis not present

## 2021-06-08 DIAGNOSIS — I2609 Other pulmonary embolism with acute cor pulmonale: Secondary | ICD-10-CM | POA: Diagnosis not present

## 2021-06-08 DIAGNOSIS — I509 Heart failure, unspecified: Secondary | ICD-10-CM | POA: Diagnosis not present

## 2021-06-08 DIAGNOSIS — J9601 Acute respiratory failure with hypoxia: Secondary | ICD-10-CM | POA: Diagnosis not present

## 2021-06-10 DIAGNOSIS — J9601 Acute respiratory failure with hypoxia: Secondary | ICD-10-CM | POA: Diagnosis not present

## 2021-06-10 DIAGNOSIS — I2609 Other pulmonary embolism with acute cor pulmonale: Secondary | ICD-10-CM | POA: Diagnosis not present

## 2021-06-10 DIAGNOSIS — I509 Heart failure, unspecified: Secondary | ICD-10-CM | POA: Diagnosis not present

## 2021-06-10 DIAGNOSIS — G308 Other Alzheimer's disease: Secondary | ICD-10-CM | POA: Diagnosis not present

## 2021-06-10 DIAGNOSIS — M6281 Muscle weakness (generalized): Secondary | ICD-10-CM | POA: Diagnosis not present

## 2021-06-10 DIAGNOSIS — G309 Alzheimer's disease, unspecified: Secondary | ICD-10-CM | POA: Diagnosis not present

## 2021-06-10 DIAGNOSIS — Z20822 Contact with and (suspected) exposure to covid-19: Secondary | ICD-10-CM | POA: Diagnosis not present

## 2021-06-18 DIAGNOSIS — I509 Heart failure, unspecified: Secondary | ICD-10-CM | POA: Diagnosis not present

## 2021-06-18 DIAGNOSIS — J9601 Acute respiratory failure with hypoxia: Secondary | ICD-10-CM | POA: Diagnosis not present

## 2021-06-18 DIAGNOSIS — I2609 Other pulmonary embolism with acute cor pulmonale: Secondary | ICD-10-CM | POA: Diagnosis not present

## 2021-06-18 DIAGNOSIS — M6281 Muscle weakness (generalized): Secondary | ICD-10-CM | POA: Diagnosis not present

## 2021-06-18 DIAGNOSIS — G309 Alzheimer's disease, unspecified: Secondary | ICD-10-CM | POA: Diagnosis not present

## 2021-06-18 DIAGNOSIS — G308 Other Alzheimer's disease: Secondary | ICD-10-CM | POA: Diagnosis not present

## 2021-06-23 DIAGNOSIS — M6281 Muscle weakness (generalized): Secondary | ICD-10-CM | POA: Diagnosis not present

## 2021-06-23 DIAGNOSIS — I2609 Other pulmonary embolism with acute cor pulmonale: Secondary | ICD-10-CM | POA: Diagnosis not present

## 2021-06-23 DIAGNOSIS — G308 Other Alzheimer's disease: Secondary | ICD-10-CM | POA: Diagnosis not present

## 2021-06-23 DIAGNOSIS — I509 Heart failure, unspecified: Secondary | ICD-10-CM | POA: Diagnosis not present

## 2021-06-23 DIAGNOSIS — Z741 Need for assistance with personal care: Secondary | ICD-10-CM | POA: Diagnosis not present

## 2021-06-23 DIAGNOSIS — G309 Alzheimer's disease, unspecified: Secondary | ICD-10-CM | POA: Diagnosis not present

## 2021-06-23 DIAGNOSIS — R4189 Other symptoms and signs involving cognitive functions and awareness: Secondary | ICD-10-CM | POA: Diagnosis not present

## 2021-06-23 DIAGNOSIS — R278 Other lack of coordination: Secondary | ICD-10-CM | POA: Diagnosis not present

## 2021-06-23 DIAGNOSIS — J9601 Acute respiratory failure with hypoxia: Secondary | ICD-10-CM | POA: Diagnosis not present

## 2021-06-28 DIAGNOSIS — I509 Heart failure, unspecified: Secondary | ICD-10-CM | POA: Diagnosis not present

## 2021-06-28 DIAGNOSIS — G309 Alzheimer's disease, unspecified: Secondary | ICD-10-CM | POA: Diagnosis not present

## 2021-06-28 DIAGNOSIS — G308 Other Alzheimer's disease: Secondary | ICD-10-CM | POA: Diagnosis not present

## 2021-06-28 DIAGNOSIS — Z20822 Contact with and (suspected) exposure to covid-19: Secondary | ICD-10-CM | POA: Diagnosis not present

## 2021-06-28 DIAGNOSIS — J9601 Acute respiratory failure with hypoxia: Secondary | ICD-10-CM | POA: Diagnosis not present

## 2021-06-28 DIAGNOSIS — I2609 Other pulmonary embolism with acute cor pulmonale: Secondary | ICD-10-CM | POA: Diagnosis not present

## 2021-06-28 DIAGNOSIS — M6281 Muscle weakness (generalized): Secondary | ICD-10-CM | POA: Diagnosis not present

## 2021-07-06 ENCOUNTER — Other Ambulatory Visit: Payer: Self-pay | Admitting: Internal Medicine

## 2021-07-12 DIAGNOSIS — Z20822 Contact with and (suspected) exposure to covid-19: Secondary | ICD-10-CM | POA: Diagnosis not present

## 2021-07-23 DIAGNOSIS — Z20822 Contact with and (suspected) exposure to covid-19: Secondary | ICD-10-CM | POA: Diagnosis not present

## 2021-07-26 DIAGNOSIS — L603 Nail dystrophy: Secondary | ICD-10-CM | POA: Diagnosis not present

## 2021-07-26 DIAGNOSIS — B351 Tinea unguium: Secondary | ICD-10-CM | POA: Diagnosis not present

## 2021-07-26 DIAGNOSIS — I7091 Generalized atherosclerosis: Secondary | ICD-10-CM | POA: Diagnosis not present

## 2021-07-26 DIAGNOSIS — R600 Localized edema: Secondary | ICD-10-CM | POA: Diagnosis not present

## 2021-08-12 ENCOUNTER — Encounter: Payer: Self-pay | Admitting: Internal Medicine

## 2021-08-12 ENCOUNTER — Ambulatory Visit: Payer: Medicare Other | Admitting: Internal Medicine

## 2021-08-12 ENCOUNTER — Other Ambulatory Visit: Payer: Self-pay

## 2021-08-12 VITALS — BP 140/65 | HR 62 | Temp 98.1°F | Resp 16 | Wt 239.0 lb

## 2021-08-12 DIAGNOSIS — N401 Enlarged prostate with lower urinary tract symptoms: Secondary | ICD-10-CM | POA: Diagnosis not present

## 2021-08-12 DIAGNOSIS — G309 Alzheimer's disease, unspecified: Secondary | ICD-10-CM

## 2021-08-12 DIAGNOSIS — I2699 Other pulmonary embolism without acute cor pulmonale: Secondary | ICD-10-CM

## 2021-08-12 DIAGNOSIS — I700 Atherosclerosis of aorta: Secondary | ICD-10-CM | POA: Diagnosis not present

## 2021-08-12 DIAGNOSIS — N138 Other obstructive and reflux uropathy: Secondary | ICD-10-CM

## 2021-08-12 DIAGNOSIS — I872 Venous insufficiency (chronic) (peripheral): Secondary | ICD-10-CM

## 2021-08-12 DIAGNOSIS — F0280 Dementia in other diseases classified elsewhere without behavioral disturbance: Secondary | ICD-10-CM | POA: Diagnosis not present

## 2021-08-12 MED ORDER — FUROSEMIDE 20 MG PO TABS: 40.0000 mg | ORAL_TABLET | Freq: Every morning | ORAL | 1 refills | Status: DC

## 2021-08-12 NOTE — Assessment & Plan Note (Signed)
On imaging Given age---no statin

## 2021-08-12 NOTE — Assessment & Plan Note (Signed)
Controlled with furosemide 40mg  daily

## 2021-08-12 NOTE — Assessment & Plan Note (Signed)
BMI 36 with HTN, venous insufficiency, HLD, etc While not optimum, goal should just be prevention of further weight gain He does do exercise classes

## 2021-08-12 NOTE — Assessment & Plan Note (Signed)
Doing well in AL apartment with support of staff On donepezil 5mg  daily

## 2021-08-12 NOTE — Progress Notes (Signed)
Subjective:    Patient ID: Christopher Jenkins, male    DOB: 11/14/1926, 88 y.o.   MRN: [id number]  HPI Visit in assisted living apartment for review of chronic health conditions Reviewed status with RN Wife is here as usual  Doing well  Still needs assistance with ADLs (no change) Walks okay Tries to go to exercise classes  No chest pain No SOB No dizziness or syncope No trouble with edema No palpitations  Voids okay Reasonable stream No incontinence  BMI 36  Known aortic atherosclerosis on imaging  Current Outpatient Medications on File Prior to Visit  Medication Sig Dispense Refill   apixaban (ELIQUIS) 5 MG TABS tablet TAKE ONE TABLET BY MOUTH TWICE DAILY 60 tablet 11   docusate sodium (COLACE) 100 MG capsule Take 2 capsules (200 mg total) by mouth at bedtime. 60 capsule 11   donepezil (ARICEPT) 5 MG tablet Take 5 mg by mouth at bedtime.     finasteride (PROSCAR) 5 MG tablet TAKE 1 TABLET BY MOUTH DAILY. 90 tablet 3   furosemide (LASIX) 20 MG tablet TAKE 1 TABLET BY MOUTH DAILY IN THE MORNING. 90 tablet 1   Multiple Vitamins-Minerals (MULTIVITAMIN WITH MINERALS) tablet Take 1 tablet by mouth daily. At 1400     potassium chloride SA (KLOR-CON) 20 MEQ tablet TAKE 1 TABLET BY MOUTH DAILY 30 tablet 11   tamsulosin (FLOMAX) 0.4 MG CAPS capsule TAKE 1 CAPSULE BY MOUTH DAILY. 90 capsule 3   No current facility-administered medications on file prior to visit.    Allergies  Allergen Reactions   No Known Allergies     Past Medical History:  Diagnosis Date   BPH (benign prostatic hyperplasia)    Duodenal mass    Elevated PSA    Gross hematuria    Hyperlipidemia    Hypertension    Hypertrophy (benign) of prostate    asymptomatic on finasteride. will f/up with urologist Christopher Jenkins   Kidney lesion 07/2015   pt has appt at kidney specialist 08/05/15 to review CT scan.   Obesity    Overweight    Tubular adenoma of colon    Urinary retention     Past Surgical  History:  Procedure Laterality Date   APPENDECTOMY     cataracts     PULMONARY THROMBECTOMY Bilateral 04/21/2021   Procedure: PULMONARY THROMBECTOMY;  Surgeon: Christopher Huxley, MD;  Location: [facility];  Service: Cardiovascular;  Laterality: Bilateral;   right knee surgery Right 12-17/2017   TOTAL HIP ARTHROPLASTY Right 08/13/2015   Procedure: TOTAL HIP ARTHROPLASTY;  Surgeon: Christopher Park, MD;  Location: [facility];  Service: Orthopedics;  Laterality: Right;   Uvula Surgery  1990    Family History  Problem Relation Age of Onset   Heart disease Mother    Stroke Mother    Heart disease Father    Kidney disease Neg Hx    Prostate cancer Neg Hx    Kidney cancer Neg Hx    Bladder Cancer Neg Hx     Social History   Socioeconomic History   Marital status: Married    Spouse name: Not on file   Number of children: 3   Years of education: Not on file   Highest education level: Not on file  Occupational History   Occupation: Retired  Tobacco Use   Smoking status: Never   Smokeless tobacco: Never  Substance and Sexual Activity   Alcohol use: No    Comment: Occasional glass of wine  3-4 times a year.   Drug use: No   Sexual activity: Never  Other Topics Concern   Not on file  Social History Narrative   2 sons, 1 daughter      Living will?   Sons/daughter would be medical decision makers   Has DNR   No tube feeds if cognitively unaware   Social Determinants of Health   Financial Resource Strain: Not on file  Food Insecurity: Not on file  Transportation Needs: Not on file  Physical Activity: Not on file  Stress: Not on file  Social Connections: Not on file  Intimate Partner Violence: Not on file   Review of Systems Appetite is good Weight stable Bowels move well No sig back or joint pains     Objective:   Physical Exam Constitutional:      Appearance: Normal appearance.  Cardiovascular:     Rate and Rhythm: Normal rate and regular rhythm.     Heart  sounds: No murmur heard.   No gallop.  Pulmonary:     Effort: Pulmonary effort is normal.     Breath sounds: Normal breath sounds. No wheezing or rales.  Abdominal:     Palpations: Abdomen is soft.     Tenderness: There is no abdominal tenderness.  Musculoskeletal:     Cervical back: Neck supple.     Comments: Calves full but no true edema  Lymphadenopathy:     Cervical: No cervical adenopathy.  Neurological:     Mental Status: He is alert.  Psychiatric:        Mood and Affect: Mood normal.        Behavior: Behavior normal.           Assessment & Plan:

## 2021-08-12 NOTE — Assessment & Plan Note (Addendum)
Now 3 months out Will plan eliquis for at least 6 months and then reconsider (5mg  bid) Only restart ASA if he goes off eliquis (was on now somehow---will stop)

## 2021-08-12 NOTE — Assessment & Plan Note (Signed)
Voids okay with finasteride 5mg  daily and tamsulosin 0.4mg  daily

## 2021-08-17 DIAGNOSIS — E8881 Metabolic syndrome: Secondary | ICD-10-CM | POA: Diagnosis not present

## 2021-08-18 DIAGNOSIS — Z20822 Contact with and (suspected) exposure to covid-19: Secondary | ICD-10-CM | POA: Diagnosis not present

## 2021-08-23 DIAGNOSIS — Z20822 Contact with and (suspected) exposure to covid-19: Secondary | ICD-10-CM | POA: Diagnosis not present

## 2021-08-24 DIAGNOSIS — Z20822 Contact with and (suspected) exposure to covid-19: Secondary | ICD-10-CM | POA: Diagnosis not present

## 2021-09-08 ENCOUNTER — Other Ambulatory Visit: Payer: Self-pay | Admitting: Internal Medicine

## 2021-09-10 DIAGNOSIS — Z20822 Contact with and (suspected) exposure to covid-19: Secondary | ICD-10-CM | POA: Diagnosis not present

## 2021-09-15 DIAGNOSIS — Z20822 Contact with and (suspected) exposure to covid-19: Secondary | ICD-10-CM | POA: Diagnosis not present

## 2021-09-18 DIAGNOSIS — Z20822 Contact with and (suspected) exposure to covid-19: Secondary | ICD-10-CM | POA: Diagnosis not present

## 2021-09-19 DIAGNOSIS — Z20822 Contact with and (suspected) exposure to covid-19: Secondary | ICD-10-CM | POA: Diagnosis not present

## 2021-09-28 DIAGNOSIS — Z20822 Contact with and (suspected) exposure to covid-19: Secondary | ICD-10-CM | POA: Diagnosis not present

## 2021-09-29 DIAGNOSIS — B351 Tinea unguium: Secondary | ICD-10-CM | POA: Diagnosis not present

## 2021-09-29 DIAGNOSIS — L603 Nail dystrophy: Secondary | ICD-10-CM | POA: Diagnosis not present

## 2021-09-29 DIAGNOSIS — I7091 Generalized atherosclerosis: Secondary | ICD-10-CM | POA: Diagnosis not present

## 2021-10-02 DIAGNOSIS — Z20822 Contact with and (suspected) exposure to covid-19: Secondary | ICD-10-CM | POA: Diagnosis not present

## 2021-10-07 DIAGNOSIS — Z20822 Contact with and (suspected) exposure to covid-19: Secondary | ICD-10-CM | POA: Diagnosis not present

## 2021-10-12 DIAGNOSIS — Z20822 Contact with and (suspected) exposure to covid-19: Secondary | ICD-10-CM | POA: Diagnosis not present

## 2021-10-18 DIAGNOSIS — I10 Essential (primary) hypertension: Secondary | ICD-10-CM | POA: Diagnosis not present

## 2021-10-18 DIAGNOSIS — E785 Hyperlipidemia, unspecified: Secondary | ICD-10-CM | POA: Diagnosis not present

## 2021-10-18 DIAGNOSIS — K760 Fatty (change of) liver, not elsewhere classified: Secondary | ICD-10-CM | POA: Diagnosis not present

## 2021-10-18 DIAGNOSIS — I739 Peripheral vascular disease, unspecified: Secondary | ICD-10-CM | POA: Diagnosis not present

## 2021-10-18 DIAGNOSIS — N400 Enlarged prostate without lower urinary tract symptoms: Secondary | ICD-10-CM | POA: Diagnosis not present

## 2021-10-18 DIAGNOSIS — Z20822 Contact with and (suspected) exposure to covid-19: Secondary | ICD-10-CM | POA: Diagnosis not present

## 2021-10-20 DIAGNOSIS — Z20822 Contact with and (suspected) exposure to covid-19: Secondary | ICD-10-CM | POA: Diagnosis not present

## 2021-10-26 DIAGNOSIS — Z20822 Contact with and (suspected) exposure to covid-19: Secondary | ICD-10-CM | POA: Diagnosis not present

## 2021-10-27 ENCOUNTER — Non-Acute Institutional Stay: Payer: Medicare Other | Admitting: Internal Medicine

## 2021-10-27 ENCOUNTER — Encounter: Payer: Self-pay | Admitting: Internal Medicine

## 2021-10-27 VITALS — BP 128/68 | Temp 98.1°F | Resp 18 | Wt 241.6 lb

## 2021-10-27 DIAGNOSIS — N401 Enlarged prostate with lower urinary tract symptoms: Secondary | ICD-10-CM | POA: Diagnosis not present

## 2021-10-27 DIAGNOSIS — I2699 Other pulmonary embolism without acute cor pulmonale: Secondary | ICD-10-CM

## 2021-10-27 DIAGNOSIS — I5032 Chronic diastolic (congestive) heart failure: Secondary | ICD-10-CM | POA: Diagnosis not present

## 2021-10-27 DIAGNOSIS — E782 Mixed hyperlipidemia: Secondary | ICD-10-CM

## 2021-10-27 DIAGNOSIS — F0280 Dementia in other diseases classified elsewhere without behavioral disturbance: Secondary | ICD-10-CM | POA: Diagnosis not present

## 2021-10-27 DIAGNOSIS — I517 Cardiomegaly: Secondary | ICD-10-CM | POA: Diagnosis not present

## 2021-10-27 DIAGNOSIS — I700 Atherosclerosis of aorta: Secondary | ICD-10-CM | POA: Diagnosis not present

## 2021-10-27 DIAGNOSIS — N138 Other obstructive and reflux uropathy: Secondary | ICD-10-CM | POA: Diagnosis not present

## 2021-10-27 DIAGNOSIS — R0602 Shortness of breath: Secondary | ICD-10-CM | POA: Diagnosis not present

## 2021-10-27 DIAGNOSIS — G4733 Obstructive sleep apnea (adult) (pediatric): Secondary | ICD-10-CM | POA: Diagnosis not present

## 2021-10-27 DIAGNOSIS — I10 Essential (primary) hypertension: Secondary | ICD-10-CM | POA: Diagnosis not present

## 2021-10-27 DIAGNOSIS — G309 Alzheimer's disease, unspecified: Secondary | ICD-10-CM | POA: Diagnosis not present

## 2021-10-27 NOTE — Assessment & Plan Note (Signed)
Encouraged diet for weight loss 

## 2021-10-27 NOTE — Assessment & Plan Note (Signed)
No statin given age ?Encouraged low fat diet ?

## 2021-10-27 NOTE — Progress Notes (Signed)
? ?Subjective:  ? ? Patient ID: Christopher Jenkins, male    DOB: 07-Aug-1926, 88 y.o.   MRN: [id number] ? ?HPI ? ?Resident seen in [address] for routine followup ?RN reports he has been more SOB with exertion, 89% on RA today. He denies shortness of breath. He reports he sleeps well, often naps after lunch. His appetite is good, weight up 2-3 lbs. He is independent with ADL's. He walks without a device. Bowels okay. He is continent. He denies pain or reflux. He reports his mood is good. ? ?Alzheimer's: He reports difficulty with short term memory. He is taking Aricept as prescribed.  ? ?HLD with Aortic Atherosclerosis: His last LDL was 129, triglycerides 181, 04/2021. He is not on a statin but taking Eliquis as prescribed. ? ?BPH: He voids fine on Flomax. He does not follow with urology. ? ?CHF: Diastolic. Worsening SOB. Stable on Furosemide and Potassium. Echo from 04/2021 reviewed. ? ?HTN: His BP today is 128/68. He is taking Furosemide as prescribed. ECG from reviewed. ? ?OSA: He averages 8 hours of sleep per night with the use of his CPAP. ? ?Hx of PE: He is taking Eliquis as prescribed. He does not follow with cardiology. ? ?Review of Systems ? ?   ?Past Medical History:  ?Diagnosis Date  ? BPH (benign prostatic hyperplasia)   ? Duodenal mass   ? Elevated PSA   ? Gross hematuria   ? Hyperlipidemia   ? Hypertension   ? Hypertrophy (benign) of prostate   ? asymptomatic on finasteride. will f/up with urologist [name]  ? Kidney lesion 07/2015  ? pt has appt at kidney specialist 08/05/15 to review CT scan.  ? Obesity   ? Overweight   ? Tubular adenoma of colon   ? Urinary retention   ? ? ?Current Outpatient Medications  ?Medication Sig Dispense Refill  ? apixaban (ELIQUIS) 5 MG TABS tablet TAKE ONE TABLET BY MOUTH TWICE DAILY 60 tablet 11  ? docusate sodium (COLACE) 100 MG capsule Take 2 capsules (200 mg total) by mouth at bedtime. 60 capsule 11  ? donepezil (ARICEPT) 5 MG tablet Take 5 mg by mouth at bedtime.    ?  finasteride (PROSCAR) 5 MG tablet TAKE 1 TABLET BY MOUTH DAILY. 90 tablet 3  ? furosemide (LASIX) 20 MG tablet TAKE 1 TABLET BY MOUTH DAILY IN THE MORNING. 90 tablet 3  ? Multiple Vitamins-Minerals (MULTIVITAMIN WITH MINERALS) tablet Take 1 tablet by mouth daily. At 1400    ? potassium chloride SA (KLOR-CON) 20 MEQ tablet TAKE 1 TABLET BY MOUTH DAILY 30 tablet 11  ? tamsulosin (FLOMAX) 0.4 MG CAPS capsule TAKE 1 CAPSULE BY MOUTH DAILY. 90 capsule 3  ? ?No current facility-administered medications for this visit.  ? ? ?Allergies  ?Allergen Reactions  ? No Known Allergies   ? ? ?Family History  ?Problem Relation Age of Onset  ? Heart disease Mother   ? Stroke Mother   ? Heart disease Father   ? Kidney disease Neg Hx   ? Prostate cancer Neg Hx   ? Kidney cancer Neg Hx   ? Bladder Cancer Neg Hx   ? ? ?Social History  ? ?Socioeconomic History  ? Marital status: Married  ?  Spouse name: Not on file  ? Number of children: 3  ? Years of education: Not on file  ? Highest education level: Not on file  ?Occupational History  ? Occupation: Retired  ?Tobacco Use  ? Smoking status:  Never  ? Smokeless tobacco: Never  ?Substance and Sexual Activity  ? Alcohol use: No  ?  Comment: Occasional glass of wine 3-4 times a year.  ? Drug use: No  ? Sexual activity: Never  ?Other Topics Concern  ? Not on file  ?Social History Narrative  ? 2 sons, 1 daughter  ?   ? Living will?  ? Sons/daughter would be medical decision makers  ? Has DNR  ? No tube feeds if cognitively unaware  ? ?Social Determinants of Health  ? ?Financial Resource Strain: Not on file  ?Food Insecurity: Not on file  ?Transportation Needs: Not on file  ?Physical Activity: Not on file  ?Stress: Not on file  ?Social Connections: Not on file  ?Intimate Partner Violence: Not on file  ? ? ? ?Constitutional: Denies fever, malaise, fatigue, headache or abrupt weight changes.  ?HEENT: Denies eye pain, eye redness, ear pain, ringing in the ears, wax buildup, runny nose, nasal  congestion, bloody nose, or sore throat. ?Respiratory: Pt reports dyspnea on exertion. Denies difficulty breathing, cough or sputum production.   ?Cardiovascular: Pt reports swelling in lower extremities. Denies chest pain, chest tightness, palpitations or swelling in the hands.  ?Gastrointestinal: Denies abdominal pain, bloating, constipation, diarrhea or blood in the stool.  ?GU: Denies urgency, frequency, pain with urination, burning sensation, blood in urine, odor or discharge. ?Musculoskeletal: Denies decrease in range of motion, difficulty with gait, muscle pain or joint pain and swelling.  ?Skin: Denies redness, rashes, lesions or ulcercations.  ?Neurological: Pt reports difficulty with memory. Denies dizziness, difficulty with speech or problems with balance and coordination.  ?Psych: Denies anxiety, depression, SI/HI. ? ?No other specific complaints in a complete review of systems (except as listed in HPI above). ? ?Objective:  ? Physical Exam ? ?BP 128/68   Temp 98.1 ?F (36.7 ?C)   Resp 18   Wt 241 lb 9.6 oz (109.6 kg)   SpO2 (!) 89%   BMI 36.74 kg/m?  ?Wt Readings from Last 3 Encounters:  ?10/27/21 241 lb 9.6 oz (109.6 kg)  ?08/12/21 239 lb (108.4 kg)  ?05/20/21 238 lb (108 kg)  ? ? ?General: Appears his stated age, obese, in NAD. ?Skin: Warm, dry and intact.  ?HEENT: Head: normal shape and size; Eyes:  EOMs intact;  ?Neck:  Neck supple, trachea midline. No masses, lumps or thyromegaly present.  ?Cardiovascular: Normal rate and rhythm. S1,S2 noted.  No murmur, rubs or gallops noted. 1 + pitting BLE edema. No carotid bruits noted. ?Pulmonary/Chest: Normal effort and positive vesicular breath sounds with scattered rhonchi throughout. No respiratory distress.  ?Abdomen: Soft and nontender. Normal bowel sounds.  ?Neurological: Alert and oriented.  ?Psychiatric: Mood and affect normal.  ? ?BMET ?   ?Component Value Date/Time  ? NA 140 04/23/2021 0552  ? NA 140 05/08/2014 0000  ? K 4.5 04/23/2021 0552  ?  CL 102 04/23/2021 0552  ? CO2 33 (H) 04/23/2021 0552  ? GLUCOSE 95 04/23/2021 0552  ? BUN 20 04/23/2021 0552  ? BUN 24 04/30/2015 1401  ? CREATININE 0.99 04/23/2021 0552  ? CREATININE 0.94 04/17/2020 1617  ? CALCIUM 8.4 (L) 04/23/2021 0552  ? GFRNONAA >60 04/23/2021 0552  ? GFRNONAA 75 12/16/2011 0842  ? GFRAA >60 08/15/2015 0418  ? GFRAA 87 12/16/2011 0842  ? ? ?Lipid Panel  ?   ?Component Value Date/Time  ? CHOL 109 04/17/2020 1617  ? TRIG 160 (H) 04/17/2020 1617  ? HDL 38 (L) 04/17/2020 1617  ?  CHOLHDL 2.9 04/17/2020 1617  ? VLDL 48.6 (H) 04/12/2019 1428  ? LDLCALC 47 04/17/2020 1617  ? ? ?CBC ?   ?Component Value Date/Time  ? WBC 7.9 04/23/2021 0552  ? RBC 4.28 04/23/2021 0552  ? HGB 13.4 04/23/2021 0552  ? HCT 42.5 04/23/2021 0552  ? PLT 167 04/23/2021 0552  ? MCV 99.3 04/23/2021 0552  ? MCH 31.3 04/23/2021 0552  ? MCHC 31.5 04/23/2021 0552  ? RDW 12.8 04/23/2021 0552  ? LYMPHSABS 1.1 09/01/2017 1210  ? MONOABS 0.9 09/01/2017 1210  ? EOSABS 0.1 09/01/2017 1210  ? BASOSABS 0.1 09/01/2017 1210  ? ? ?Hgb A1C ?Lab Results  ?Component Value Date  ? HGBA1C 5.5 04/12/2019  ? ? ? ? ? ? ? ?   ?Assessment & Plan:  ? ? ? ?Webb Silversmith, NP ? ?

## 2021-10-27 NOTE — Assessment & Plan Note (Signed)
-   Continue Flomax 

## 2021-10-27 NOTE — Assessment & Plan Note (Signed)
Controlled on Furosemide ?

## 2021-10-27 NOTE — Assessment & Plan Note (Signed)
Continue Aricept ?Appreciate SNF care ?

## 2021-10-27 NOTE — Patient Instructions (Signed)
Heart Failure, Diagnosis  Heart failure is a condition in which the heart has trouble pumping blood. This may mean that the heart cannot pump enough blood out to the body or that the heart does not fill up with enough blood. For some people with heart failure, fluid may back up into the lungs. There may also be swelling (edema) in the lower legs. Heart failure is usually a long-term (chronic) condition. It is important for you to take good care of yourself and follow the treatment plan from your health care provider. Different stages of heart failure have different treatment plans. The stages are: Stage A: At risk for heart failure. Having no symptoms of heart failure, but being at risk for developing heart failure. Stage B: Pre-heart failure. Having no symptoms of heart failure, but having structural changes to the heart that indicate heart failure. Stage C: Symptomatic heart failure. Having symptoms of heart failure in addition to structural changes to the heart that indicate heart failure. Stage D: Advanced heart failure. Having symptoms that interfere with daily life and frequent hospitalizations related to heart failure. What are the causes? This condition may be caused by: High blood pressure (hypertension). Hypertension causes the heart muscle to work harder than normal. Coronary artery disease, or CAD. CAD is the buildup of cholesterol and fat (plaque) in the arteries of the heart. Heart attack, also called myocardial infarction. This injures the heart muscle, making it hard for the heart to pump blood. Abnormal heart valves. The valves do not open and close properly, forcing the heart to pump harder to keep the blood flowing. Heart muscle disease, inflammation, or infection (cardiomyopathy or myocarditis). This is damage to the heart muscle. It can increase the risk of heart failure. Lung disease. The heart works harder when the lungs are not healthy. What increases the risk? The risk  of heart failure increases as a person ages. This condition is also more likely to develop in people who: Are obese. Use tobacco or nicotine products. Abuse alcohol or drugs. Have taken medicines that can damage the heart, such as chemotherapy drugs. Have any of these conditions: Diabetes. Abnormal heart rhythms. Thyroid problems. Low blood counts (anemia). Chronic kidney disease. Have a family history of heart failure. What are the signs or symptoms? Symptoms of this condition include: Shortness of breath with activity, such as when climbing stairs. A cough that does not go away. Swelling of the feet, ankles, legs, or abdomen. Losing or gaining weight for no reason. Trouble breathing when lying flat. Waking from sleep because of the need to sit up and get more air. Rapid heartbeat. Other symptoms may include: Tiredness (fatigue) and loss of energy. Feeling light-headed, dizzy, or close to fainting. Nausea or loss of appetite. Waking up more often during the night to urinate (nocturia). Confusion. How is this diagnosed? This condition is diagnosed based on: Your medical history, symptoms, and a physical exam. Blood tests. Diagnostic tests, which may include: Echocardiogram. Electrocardiogram (ECG). Chest X-ray. Exercise stress test. Cardiac MRI. Cardiac catheterization and angiogram. Radionuclide scans. How is this treated? Treatment for this condition is aimed at managing the symptoms of heart failure. Medicines Treatment may include medicines that: Help lower blood pressure by relaxing (dilating) the blood vessels. These medicines are called ACE inhibitors (angiotensin-converting enzyme), ARBs (angiotensin receptor blockers), or vasodilators. Cause the kidneys to remove salt and water from the blood through urination (diuretics). Improve heart muscle strength and prevent the heart from beating too fast (beta blockers). Increase the   force of the heartbeat  (digoxin). Lower heart rates. Certain diabetes medicines (SGLT-2 inhibitors) may also be used in treatment. Healthy behavior changes Treatment may also include making healthy lifestyle changes, such as: Reaching and staying at a healthy weight. Not using tobacco or nicotine products. Eating heart-healthy foods. Limiting or avoiding alcohol. Stopping the use of illegal drugs. Being physically active. Participating in a cardiac rehabilitation program, which is a treatment program to improve your health and well-being through exercise training, education, and counseling. Other treatments Other treatments may include: Procedures to open blocked arteries or repair damaged valves. Placing a pacemaker to improve heart function (cardiac resynchronization therapy). Placing a device to treat serious abnormal heart rhythms (implantable cardioverter defibrillator, or ICD). Placing a device to improve the pumping ability of the heart (left ventricular assist device, or LVAD). Receiving a healthy heart from a donor (heart transplant). This is done when other treatments have not helped. Follow these instructions at home: Manage other health conditions as told by your health care provider. These may include hypertension, diabetes, thyroid disease, or abnormal heart rhythms. Get ongoing education and support as needed. Learn as much as you can about heart failure. Keep all follow-up visits. This is important. Where to find more information American Heart Association: www.heart.org Centers for Disease Control and Prevention: www.cdc.gov NIH National Institute on Aging: www.nia.nih.gov Summary Heart failure is a condition in which the heart has trouble pumping blood. This condition is commonly caused by high blood pressure and other diseases of the heart and lungs. Symptoms of this condition include shortness of breath, tiredness (fatigue), nausea, and swelling of the feet, ankles, legs, or  abdomen. Treatments for this condition may include medicines, lifestyle changes, and surgery. Manage other health conditions as told by your health care provider. This information is not intended to replace advice given to you by your health care provider. Make sure you discuss any questions you have with your health care provider. Document Revised: 12/03/2020 Document Reviewed: 12/28/2019 Elsevier Patient Education  2023 Elsevier Inc.  

## 2021-10-27 NOTE — Assessment & Plan Note (Signed)
-   Continue Eliquis 

## 2021-10-27 NOTE — Assessment & Plan Note (Signed)
Worsening DOE ?Increase Furosemide to 40 mg BID x 3 days then back to 40 mg daily ?Chest xray given decreased sats ?Would recommend Oxygen if O2 sats < 88 ?

## 2021-10-27 NOTE — Assessment & Plan Note (Signed)
Not on statin given age Continue Eliquis 

## 2021-10-27 NOTE — Assessment & Plan Note (Signed)
Encouraged weight loss as this can reduce sleep apnea symptoms ?Continue CPAP ?

## 2021-10-28 DIAGNOSIS — Z20828 Contact with and (suspected) exposure to other viral communicable diseases: Secondary | ICD-10-CM | POA: Diagnosis not present

## 2021-10-28 DIAGNOSIS — Z20822 Contact with and (suspected) exposure to covid-19: Secondary | ICD-10-CM | POA: Diagnosis not present

## 2021-10-28 DIAGNOSIS — Z1152 Encounter for screening for COVID-19: Secondary | ICD-10-CM | POA: Diagnosis not present

## 2021-11-04 ENCOUNTER — Other Ambulatory Visit: Payer: Self-pay | Admitting: Internal Medicine

## 2021-11-08 DIAGNOSIS — I519 Heart disease, unspecified: Secondary | ICD-10-CM | POA: Diagnosis not present

## 2021-11-08 DIAGNOSIS — F0280 Dementia in other diseases classified elsewhere without behavioral disturbance: Secondary | ICD-10-CM | POA: Diagnosis not present

## 2021-11-08 DIAGNOSIS — M6281 Muscle weakness (generalized): Secondary | ICD-10-CM | POA: Diagnosis not present

## 2021-11-08 DIAGNOSIS — G309 Alzheimer's disease, unspecified: Secondary | ICD-10-CM | POA: Diagnosis not present

## 2021-11-13 ENCOUNTER — Other Ambulatory Visit: Payer: Self-pay | Admitting: Internal Medicine

## 2021-11-16 DIAGNOSIS — Z23 Encounter for immunization: Secondary | ICD-10-CM | POA: Diagnosis not present

## 2021-11-17 ENCOUNTER — Non-Acute Institutional Stay (INDEPENDENT_AMBULATORY_CARE_PROVIDER_SITE_OTHER): Payer: Medicare Other | Admitting: Internal Medicine

## 2021-11-17 VITALS — BP 128/68 | HR 54 | Resp 18 | Wt 241.6 lb

## 2021-11-17 DIAGNOSIS — R0902 Hypoxemia: Secondary | ICD-10-CM

## 2021-11-17 DIAGNOSIS — I5032 Chronic diastolic (congestive) heart failure: Secondary | ICD-10-CM

## 2021-11-18 ENCOUNTER — Encounter: Payer: Self-pay | Admitting: Internal Medicine

## 2021-11-18 DIAGNOSIS — I517 Cardiomegaly: Secondary | ICD-10-CM | POA: Diagnosis not present

## 2021-11-18 NOTE — Progress Notes (Signed)
Subjective:    Patient ID: Christopher Jenkins, male    DOB: 1926-12-03, 88 y.o.   MRN: [id number]  HPI  Resident seen [address]  for persistent decreased sats. He was seen 5/10 for the same, given 3 days of Furosemide. Chest xray was ordered but this result could not be found. RN reports he has persistent decreased sats into high 80's low 90's. Resident reports he does not feel short of breath but feels weak. He does have a history of CHF.   Review of Systems     Past Medical History:  Diagnosis Date   BPH (benign prostatic hyperplasia)    Duodenal mass    Elevated PSA    Gross hematuria    Hyperlipidemia    Hypertension    Hypertrophy (benign) of prostate    asymptomatic on finasteride. will f/up with urologist [name]   Kidney lesion 07/2015   pt has appt at kidney specialist 08/05/15 to review CT scan.   Obesity    Overweight    Tubular adenoma of colon    Urinary retention     Current Outpatient Medications  Medication Sig Dispense Refill   apixaban (ELIQUIS) 5 MG TABS tablet TAKE ONE TABLET BY MOUTH TWICE DAILY 60 tablet 11   docusate sodium (COLACE) 100 MG capsule Take 2 capsules (200 mg total) by mouth at bedtime. 60 capsule 11   donepezil (ARICEPT) 5 MG tablet Take 5 mg by mouth at bedtime.     finasteride (PROSCAR) 5 MG tablet TAKE 1 TABLET BY MOUTH DAILY. 90 tablet 3   furosemide (LASIX) 20 MG tablet TAKE 1 TABLET BY MOUTH DAILY IN THE MORNING. 90 tablet 3   Multiple Vitamins-Minerals (MULTIVITAMIN WITH MINERALS) tablet Take 1 tablet by mouth daily. At 1400     potassium chloride SA (KLOR-CON M) 20 MEQ tablet TAKE 1 TABLET BY MOUTH DAILY 30 tablet 11   tamsulosin (FLOMAX) 0.4 MG CAPS capsule TAKE 1 CAPSULE BY MOUTH DAILY. 90 capsule 3   No current facility-administered medications for this visit.    Allergies  Allergen Reactions   No Known Allergies     Family History  Problem Relation Age of Onset   Heart disease Mother    Stroke Mother    Heart  disease Father    Kidney disease Neg Hx    Prostate cancer Neg Hx    Kidney cancer Neg Hx    Bladder Cancer Neg Hx     Social History   Socioeconomic History   Marital status: Married    Spouse name: Not on file   Number of children: 3   Years of education: Not on file   Highest education level: Not on file  Occupational History   Occupation: Retired  Tobacco Use   Smoking status: Never   Smokeless tobacco: Never  Substance and Sexual Activity   Alcohol use: No    Comment: Occasional glass of wine 3-4 times a year.   Drug use: No   Sexual activity: Never  Other Topics Concern   Not on file  Social History Narrative   2 sons, 1 daughter      Living will?   Sons/daughter would be medical decision makers   Has DNR   No tube feeds if cognitively unaware   Social Determinants of Health   Financial Resource Strain: Not on file  Food Insecurity: Not on file  Transportation Needs: Not on file  Physical Activity: Not on file  Stress:  Not on file  Social Connections: Not on file  Intimate Partner Violence: Not on file     Constitutional: Pt reports fatigue. Denies fever, malaise, headache or abrupt weight changes.  HEENT: Denies eye pain, eye redness, ear pain, ringing in the ears, wax buildup, runny nose, nasal congestion, bloody nose, or sore throat. Respiratory: Denies difficulty breathing, shortness of breath, cough or sputum production.   Cardiovascular: Pt reports swelling in his legs. Denies chest pain, chest tightness, palpitations or swelling in the hands.  Musculoskeletal: Pt reports weakness. Denies decrease in range of motion, difficulty with gait, muscle pain or joint pain and swelling.  Skin: Denies redness, rashes, lesions or ulcercations.    No other specific complaints in a complete review of systems (except as listed in HPI above).  Objective:   Physical Exam  BP 128/68   Pulse (!) 54   Resp 18   Wt 241 lb 9.6 oz (109.6 kg)   SpO2 (!) 89%   BMI  36.74 kg/m   Wt Readings from Last 3 Encounters:  10/27/21 241 lb 9.6 oz (109.6 kg)  08/12/21 239 lb (108.4 kg)  05/20/21 238 lb (108 kg)    General: Appears his stated age, obese, in NAD. Skin: Warm, dry and intact.  HEENT: Head: normal shape and size; Eyes: EOMs intact;  Cardiovascular: Normal rate and rhythm. S1,S2 noted.  No murmur, rubs or gallops noted.  Trace pitting BLE edema.  Pulmonary/Chest: Normal effort and diminished breath sounds. No respiratory distress. No wheezes, rales or ronchi noted.  Neurological: Alert and oriented.   BMET    Component Value Date/Time   NA 140 04/23/2021 0552   NA 140 05/08/2014 0000   K 4.5 04/23/2021 0552   CL 102 04/23/2021 0552   CO2 33 (H) 04/23/2021 0552   GLUCOSE 95 04/23/2021 0552   BUN 20 04/23/2021 0552   BUN 24 04/30/2015 1401   CREATININE 0.99 04/23/2021 0552   CREATININE 0.94 04/17/2020 1617   CALCIUM 8.4 (L) 04/23/2021 0552   GFRNONAA >60 04/23/2021 0552   GFRNONAA 75 12/16/2011 0842   GFRAA >60 08/15/2015 0418   GFRAA 87 12/16/2011 0842    Lipid Panel     Component Value Date/Time   CHOL 109 04/17/2020 1617   TRIG 160 (H) 04/17/2020 1617   HDL 38 (L) 04/17/2020 1617   CHOLHDL 2.9 04/17/2020 1617   VLDL 48.6 (H) 04/12/2019 1428   LDLCALC 47 04/17/2020 1617    CBC    Component Value Date/Time   WBC 7.9 04/23/2021 0552   RBC 4.28 04/23/2021 0552   HGB 13.4 04/23/2021 0552   HCT 42.5 04/23/2021 0552   PLT 167 04/23/2021 0552   MCV 99.3 04/23/2021 0552   MCH 31.3 04/23/2021 0552   MCHC 31.5 04/23/2021 0552   RDW 12.8 04/23/2021 0552   LYMPHSABS 1.1 09/01/2017 1210   MONOABS 0.9 09/01/2017 1210   EOSABS 0.1 09/01/2017 1210   BASOSABS 0.1 09/01/2017 1210    Hgb A1C Lab Results  Component Value Date   HGBA1C 5.5 04/12/2019            Assessment & Plan:   Decreased Oxygen Saturations, CHF:  Will obtain chest xray Will obtain walking sat Will obtain echocardiogram Continue current dose of  Furosemide and Potassium Will need oxygen is sats remain < 90% on RA  Will reassess as needed and follow up with patient once imaging studies are finishceed  Webb Silversmith, NP

## 2021-11-22 DIAGNOSIS — I509 Heart failure, unspecified: Secondary | ICD-10-CM | POA: Diagnosis not present

## 2021-11-22 DIAGNOSIS — R609 Edema, unspecified: Secondary | ICD-10-CM | POA: Diagnosis not present

## 2021-11-22 DIAGNOSIS — N401 Enlarged prostate with lower urinary tract symptoms: Secondary | ICD-10-CM | POA: Diagnosis not present

## 2021-11-22 DIAGNOSIS — Z741 Need for assistance with personal care: Secondary | ICD-10-CM | POA: Diagnosis not present

## 2021-11-22 DIAGNOSIS — R4189 Other symptoms and signs involving cognitive functions and awareness: Secondary | ICD-10-CM | POA: Diagnosis not present

## 2021-11-22 DIAGNOSIS — N139 Obstructive and reflux uropathy, unspecified: Secondary | ICD-10-CM | POA: Diagnosis not present

## 2021-11-22 DIAGNOSIS — R278 Other lack of coordination: Secondary | ICD-10-CM | POA: Diagnosis not present

## 2021-11-22 DIAGNOSIS — G309 Alzheimer's disease, unspecified: Secondary | ICD-10-CM | POA: Diagnosis not present

## 2021-11-24 DIAGNOSIS — R278 Other lack of coordination: Secondary | ICD-10-CM | POA: Diagnosis not present

## 2021-11-24 DIAGNOSIS — N401 Enlarged prostate with lower urinary tract symptoms: Secondary | ICD-10-CM | POA: Diagnosis not present

## 2021-11-24 DIAGNOSIS — N139 Obstructive and reflux uropathy, unspecified: Secondary | ICD-10-CM | POA: Diagnosis not present

## 2021-11-24 DIAGNOSIS — G309 Alzheimer's disease, unspecified: Secondary | ICD-10-CM | POA: Diagnosis not present

## 2021-11-24 DIAGNOSIS — R079 Chest pain, unspecified: Secondary | ICD-10-CM | POA: Diagnosis not present

## 2021-11-24 DIAGNOSIS — R931 Abnormal findings on diagnostic imaging of heart and coronary circulation: Secondary | ICD-10-CM | POA: Diagnosis not present

## 2021-11-24 DIAGNOSIS — R609 Edema, unspecified: Secondary | ICD-10-CM | POA: Diagnosis not present

## 2021-11-24 DIAGNOSIS — I509 Heart failure, unspecified: Secondary | ICD-10-CM | POA: Diagnosis not present

## 2021-12-01 DIAGNOSIS — I509 Heart failure, unspecified: Secondary | ICD-10-CM | POA: Diagnosis not present

## 2021-12-01 DIAGNOSIS — R609 Edema, unspecified: Secondary | ICD-10-CM | POA: Diagnosis not present

## 2021-12-01 DIAGNOSIS — N139 Obstructive and reflux uropathy, unspecified: Secondary | ICD-10-CM | POA: Diagnosis not present

## 2021-12-01 DIAGNOSIS — R278 Other lack of coordination: Secondary | ICD-10-CM | POA: Diagnosis not present

## 2021-12-01 DIAGNOSIS — G309 Alzheimer's disease, unspecified: Secondary | ICD-10-CM | POA: Diagnosis not present

## 2021-12-01 DIAGNOSIS — N401 Enlarged prostate with lower urinary tract symptoms: Secondary | ICD-10-CM | POA: Diagnosis not present

## 2021-12-02 DIAGNOSIS — I7091 Generalized atherosclerosis: Secondary | ICD-10-CM | POA: Diagnosis not present

## 2021-12-02 DIAGNOSIS — B351 Tinea unguium: Secondary | ICD-10-CM | POA: Diagnosis not present

## 2021-12-03 ENCOUNTER — Other Ambulatory Visit: Payer: Self-pay | Admitting: Internal Medicine

## 2021-12-03 DIAGNOSIS — N401 Enlarged prostate with lower urinary tract symptoms: Secondary | ICD-10-CM

## 2021-12-03 DIAGNOSIS — N138 Other obstructive and reflux uropathy: Secondary | ICD-10-CM

## 2021-12-06 DIAGNOSIS — N401 Enlarged prostate with lower urinary tract symptoms: Secondary | ICD-10-CM | POA: Diagnosis not present

## 2021-12-06 DIAGNOSIS — G309 Alzheimer's disease, unspecified: Secondary | ICD-10-CM | POA: Diagnosis not present

## 2021-12-06 DIAGNOSIS — R278 Other lack of coordination: Secondary | ICD-10-CM | POA: Diagnosis not present

## 2021-12-06 DIAGNOSIS — I509 Heart failure, unspecified: Secondary | ICD-10-CM | POA: Diagnosis not present

## 2021-12-06 DIAGNOSIS — R609 Edema, unspecified: Secondary | ICD-10-CM | POA: Diagnosis not present

## 2021-12-06 DIAGNOSIS — N139 Obstructive and reflux uropathy, unspecified: Secondary | ICD-10-CM | POA: Diagnosis not present

## 2021-12-08 DIAGNOSIS — G309 Alzheimer's disease, unspecified: Secondary | ICD-10-CM | POA: Diagnosis not present

## 2021-12-08 DIAGNOSIS — I509 Heart failure, unspecified: Secondary | ICD-10-CM | POA: Diagnosis not present

## 2021-12-08 DIAGNOSIS — N401 Enlarged prostate with lower urinary tract symptoms: Secondary | ICD-10-CM | POA: Diagnosis not present

## 2021-12-08 DIAGNOSIS — R278 Other lack of coordination: Secondary | ICD-10-CM | POA: Diagnosis not present

## 2021-12-08 DIAGNOSIS — R609 Edema, unspecified: Secondary | ICD-10-CM | POA: Diagnosis not present

## 2021-12-08 DIAGNOSIS — N139 Obstructive and reflux uropathy, unspecified: Secondary | ICD-10-CM | POA: Diagnosis not present

## 2021-12-13 DIAGNOSIS — R609 Edema, unspecified: Secondary | ICD-10-CM | POA: Diagnosis not present

## 2021-12-13 DIAGNOSIS — N139 Obstructive and reflux uropathy, unspecified: Secondary | ICD-10-CM | POA: Diagnosis not present

## 2021-12-13 DIAGNOSIS — G309 Alzheimer's disease, unspecified: Secondary | ICD-10-CM | POA: Diagnosis not present

## 2021-12-13 DIAGNOSIS — N401 Enlarged prostate with lower urinary tract symptoms: Secondary | ICD-10-CM | POA: Diagnosis not present

## 2021-12-13 DIAGNOSIS — I509 Heart failure, unspecified: Secondary | ICD-10-CM | POA: Diagnosis not present

## 2021-12-13 DIAGNOSIS — R278 Other lack of coordination: Secondary | ICD-10-CM | POA: Diagnosis not present

## 2022-01-19 NOTE — Progress Notes (Unsigned)
4:41 PM   Christopher Jenkins 07/11/1926 [id number]  Referring provider: [name], MD [address] [place],  [place] [id number]  Urological history: 1. Nocturia -contributing factors of obstructive sleep apnea, hypertension, heart disease, cognitive decline and BPH -sleeps with CPAP  2. High risk hematuria -non-smoker -CTU in 2017 and 07/2016.  He was found to have a Bosniak 34F on the CT in 07/2015 and it remained unchanged on 07/2016 CTU -cysto 2021 NED -cytology 2021 neg -no reports of gross heme  3. Bosniak 2 cyst -MRI 05/2019 The lesion of concern adjacent to the cystic lesion in the lower pole of the left kidney has minimally enlarged compared with most recent study, although shows no definite enhancement on this examination, most consistent with a complex cyst (Bosniak 2).  Additional simple renal cysts bilaterally. No enhancing renal masses identified.  4. BPH with LU TS -cysto 2021 Trabeculated with cellules and small posterior wall diverticula -I PSS *** -tamsulosin 0.4 mg daily and finasteride 5 mg daily   No chief complaint on file.   HPI: Christopher Jenkins is a 88 y.o. male who presents today for yearly follow up with his son-in-law, [name].         Score:  1-7 Mild 8-19 Moderate 20-35 Severe  PMH: Past Medical History:  Diagnosis Date   BPH (benign prostatic hyperplasia)    Duodenal mass    Elevated PSA    Gross hematuria    Hyperlipidemia    Hypertension    Hypertrophy (benign) of prostate    asymptomatic on finasteride. will f/up with urologist [name]   Kidney lesion 07/2015   pt has appt at kidney specialist 08/05/15 to review CT scan.   Obesity    Overweight    Tubular adenoma of colon    Urinary retention     Surgical History: Past Surgical History:  Procedure Laterality Date   APPENDECTOMY     cataracts     PULMONARY THROMBECTOMY Bilateral 04/21/2021   Procedure: PULMONARY THROMBECTOMY;  Surgeon: [name], MD;   Location: [facility];  Service: Cardiovascular;  Laterality: Bilateral;   right knee surgery Right 12-17/2017   TOTAL HIP ARTHROPLASTY Right 08/13/2015   Procedure: TOTAL HIP ARTHROPLASTY;  Surgeon: [name], MD;  Location: [facility];  Service: Orthopedics;  Laterality: Right;   Uvula Surgery  1990    Home Medications:  Allergies as of 01/20/2022       Reactions   No Known Allergies         Medication List        Accurate as of January 19, 2022  4:41 PM. If you have any questions, ask your nurse or doctor.          docusate sodium 100 MG capsule Commonly known as: Colace Take 2 capsules (200 mg total) by mouth at bedtime.   donepezil 5 MG tablet Commonly known as: ARICEPT Take 5 mg by mouth at bedtime.   Eliquis 5 MG Tabs tablet Generic drug: apixaban TAKE ONE TABLET BY MOUTH TWICE DAILY   finasteride 5 MG tablet Commonly known as: PROSCAR TAKE 1 TABLET BY MOUTH DAILY   furosemide 20 MG tablet Commonly known as: LASIX TAKE 1 TABLET BY MOUTH DAILY IN THE MORNING.   multivitamin with minerals tablet Take 1 tablet by mouth daily. At 1400   potassium chloride SA 20 MEQ tablet Commonly known as: KLOR-CON M TAKE 1 TABLET BY MOUTH DAILY  tamsulosin 0.4 MG Caps capsule Commonly known as: FLOMAX TAKE 1 CAPSULE BY MOUTH ONCE DAILY        Allergies:  Allergies  Allergen Reactions   No Known Allergies     Family History: Family History  Problem Relation Age of Onset   Heart disease Mother    Stroke Mother    Heart disease Father    Kidney disease Neg Hx    Prostate cancer Neg Hx    Kidney cancer Neg Hx    Bladder Cancer Neg Hx     Social History:  reports that he has never smoked. He has never used smokeless tobacco. He reports that he does not drink alcohol and does not use drugs.  ROS: For pertinent review of systems please refer to history of present illness  Physical Exam: There were no vitals taken for this  visit. Constitutional:  Well nourished. Alert and oriented, No acute distress. HEENT: [place] AT, moist mucus membranes.  Trachea midline Cardiovascular: No clubbing, cyanosis, or edema. Respiratory: Normal respiratory effort, no increased work of breathing. GU: No CVA tenderness.  No bladder fullness or masses.  Patient with circumcised/uncircumcised phallus. ***Foreskin easily retracted***  Urethral meatus is patent.  No penile discharge. No penile lesions or rashes. Scrotum without lesions, cysts, rashes and/or edema.  Testicles are located scrotally bilaterally. No masses are appreciated in the testicles. Left and right epididymis are normal. Rectal: Patient with  normal sphincter tone. Anus and perineum without scarring or rashes. No rectal masses are appreciated. Prostate is approximately *** grams, *** nodules are appreciated. Seminal vesicles are normal. Neurologic: Grossly intact, no focal deficits, moving all 4 extremities. Psychiatric: Normal mood and affect.   Laboratory Data: Results for orders placed or performed during the hospital encounter of 04/20/21  Resp Panel by RT-PCR (Flu A&B, Covid) Nasopharyngeal Swab   Specimen: Nasopharyngeal Swab; Nasopharyngeal(NP) swabs in vial transport medium  Result Value Ref Range   SARS Coronavirus 2 by RT PCR NEGATIVE NEGATIVE   Influenza A by PCR NEGATIVE NEGATIVE   Influenza B by PCR NEGATIVE NEGATIVE  Resp Panel by RT-PCR (Flu A&B, Covid) Nasopharyngeal Swab   Specimen: Nasopharyngeal Swab; Nasopharyngeal(NP) swabs in vial transport medium  Result Value Ref Range   SARS Coronavirus 2 by RT PCR NEGATIVE NEGATIVE   Influenza A by PCR NEGATIVE NEGATIVE   Influenza B by PCR NEGATIVE NEGATIVE  CBC  Result Value Ref Range   WBC 11.5 (H) 4.0 - 10.5 K/uL   RBC 4.51 4.22 - 5.81 MIL/uL   Hemoglobin 15.0 13.0 - 17.0 g/dL   HCT 44.3 39.0 - 52.0 %   MCV 98.2 80.0 - 100.0 fL   MCH 33.3 26.0 - 34.0 pg   MCHC 33.9 30.0 - 36.0 g/dL   RDW 13.0 11.5 -  15.5 %   Platelets 160 150 - 400 K/uL   nRBC 0.0 0.0 - 0.2 %  Comprehensive metabolic panel  Result Value Ref Range   Sodium 140 135 - 145 mmol/L   Potassium 4.8 3.5 - 5.1 mmol/L   Chloride 104 98 - 111 mmol/L   CO2 26 22 - 32 mmol/L   Glucose, Bld 137 (H) 70 - 99 mg/dL   BUN 27 (H) 8 - 23 mg/dL   Creatinine, Ser 1.22 0.61 - 1.24 mg/dL   Calcium 8.8 (L) 8.9 - 10.3 mg/dL   Total Protein 6.1 (L) 6.5 - 8.1 g/dL   Albumin 3.1 (L) 3.5 - 5.0 g/dL   AST 24 15 -  41 U/L   ALT 23 0 - 44 U/L   Alkaline Phosphatase 94 38 - 126 U/L   Total Bilirubin 0.8 0.3 - 1.2 mg/dL   GFR, Estimated 56 (L) >60 mL/min   Anion gap 10 5 - 15  APTT  Result Value Ref Range   aPTT 28 24 - 36 seconds  Protime-INR  Result Value Ref Range   Prothrombin Time 13.8 11.4 - 15.2 seconds   INR 1.1 0.8 - 1.2  Basic metabolic panel  Result Value Ref Range   Sodium 139 135 - 145 mmol/L   Potassium 4.5 3.5 - 5.1 mmol/L   Chloride 103 98 - 111 mmol/L   CO2 29 22 - 32 mmol/L   Glucose, Bld 108 (H) 70 - 99 mg/dL   BUN 24 (H) 8 - 23 mg/dL   Creatinine, Ser 1.10 0.61 - 1.24 mg/dL   Calcium 8.5 (L) 8.9 - 10.3 mg/dL   GFR, Estimated >60 >60 mL/min   Anion gap 7 5 - 15  CBC  Result Value Ref Range   WBC 9.3 4.0 - 10.5 K/uL   RBC 4.46 4.22 - 5.81 MIL/uL   Hemoglobin 14.5 13.0 - 17.0 g/dL   HCT 44.1 39.0 - 52.0 %   MCV 98.9 80.0 - 100.0 fL   MCH 32.5 26.0 - 34.0 pg   MCHC 32.9 30.0 - 36.0 g/dL   RDW 13.1 11.5 - 15.5 %   Platelets 149 (L) 150 - 400 K/uL   nRBC 0.0 0.0 - 0.2 %  Heparin level (unfractionated)  Result Value Ref Range   Heparin Unfractionated 0.70 0.30 - 0.70 IU/mL  Heparin level (unfractionated)  Result Value Ref Range   Heparin Unfractionated 0.50 0.30 - 0.70 IU/mL  Heparin level (unfractionated)  Result Value Ref Range   Heparin Unfractionated 0.55 0.30 - 0.70 IU/mL  CBC  Result Value Ref Range   WBC 9.5 4.0 - 10.5 K/uL   RBC 4.32 4.22 - 5.81 MIL/uL   Hemoglobin 13.7 13.0 - 17.0 g/dL   HCT  43.1 39.0 - 52.0 %   MCV 99.8 80.0 - 100.0 fL   MCH 31.7 26.0 - 34.0 pg   MCHC 31.8 30.0 - 36.0 g/dL   RDW 12.9 11.5 - 15.5 %   Platelets 152 150 - 400 K/uL   nRBC 0.0 0.0 - 0.2 %  Basic metabolic panel  Result Value Ref Range   Sodium 140 135 - 145 mmol/L   Potassium 4.5 3.5 - 5.1 mmol/L   Chloride 102 98 - 111 mmol/L   CO2 33 (H) 22 - 32 mmol/L   Glucose, Bld 95 70 - 99 mg/dL   BUN 20 8 - 23 mg/dL   Creatinine, Ser 0.99 0.61 - 1.24 mg/dL   Calcium 8.4 (L) 8.9 - 10.3 mg/dL   GFR, Estimated >60 >60 mL/min   Anion gap 5 5 - 15  CBC  Result Value Ref Range   WBC 7.9 4.0 - 10.5 K/uL   RBC 4.28 4.22 - 5.81 MIL/uL   Hemoglobin 13.4 13.0 - 17.0 g/dL   HCT 42.5 39.0 - 52.0 %   MCV 99.3 80.0 - 100.0 fL   MCH 31.3 26.0 - 34.0 pg   MCHC 31.5 30.0 - 36.0 g/dL   RDW 12.8 11.5 - 15.5 %   Platelets 167 150 - 400 K/uL   nRBC 0.0 0.0 - 0.2 %  ECHOCARDIOGRAM COMPLETE  Result Value Ref Range   Weight 3,808 oz   Height  68 in   BP 144/75 mmHg   S' Lateral 2.78 cm   Area-P 1/2 3.77 cm2  Troponin I (High Sensitivity)  Result Value Ref Range   Troponin I (High Sensitivity) 109 (HH) <18 ng/L  Troponin I (High Sensitivity)  Result Value Ref Range   Troponin I (High Sensitivity) 115 (HH) <18 ng/L  I have reviewed the labs.   Assessment & Plan:    1. Nocturia -Compliant with CPAP use   2. High risk hematuria -Hematuria work up demonstrated an enlarged prostate -No reports of gross hematuria  3. Left Bosniak 2 cyst MRI demonstrates a Bosniak 2 cyst - no further follow up is warranted   4. BPH with LUTS -Continue tamsulosin 0.4 mg daily and finasteride 5 mg daily  No follow-ups on file.  Zara Council, PA-C  [facility] [address] [place], [place] [address] [phone]

## 2022-01-20 ENCOUNTER — Encounter: Payer: Self-pay | Admitting: Urology

## 2022-01-20 ENCOUNTER — Ambulatory Visit (INDEPENDENT_AMBULATORY_CARE_PROVIDER_SITE_OTHER): Payer: Medicare Other | Admitting: Urology

## 2022-01-20 VITALS — BP 157/75 | HR 67 | Ht 68.0 in | Wt 245.0 lb

## 2022-01-20 DIAGNOSIS — N401 Enlarged prostate with lower urinary tract symptoms: Secondary | ICD-10-CM

## 2022-01-20 DIAGNOSIS — N281 Cyst of kidney, acquired: Secondary | ICD-10-CM

## 2022-01-20 DIAGNOSIS — R319 Hematuria, unspecified: Secondary | ICD-10-CM

## 2022-01-20 DIAGNOSIS — N138 Other obstructive and reflux uropathy: Secondary | ICD-10-CM

## 2022-01-20 DIAGNOSIS — R351 Nocturia: Secondary | ICD-10-CM | POA: Diagnosis not present

## 2022-01-27 ENCOUNTER — Encounter: Payer: Self-pay | Admitting: Internal Medicine

## 2022-01-27 ENCOUNTER — Ambulatory Visit: Payer: Medicare Other | Admitting: Internal Medicine

## 2022-01-27 VITALS — BP 148/66 | HR 60 | Temp 97.1°F | Resp 20 | Wt 246.5 lb

## 2022-01-27 DIAGNOSIS — I5032 Chronic diastolic (congestive) heart failure: Secondary | ICD-10-CM

## 2022-01-27 DIAGNOSIS — I2782 Chronic pulmonary embolism: Secondary | ICD-10-CM | POA: Diagnosis not present

## 2022-01-27 DIAGNOSIS — F0280 Dementia in other diseases classified elsewhere without behavioral disturbance: Secondary | ICD-10-CM

## 2022-01-27 DIAGNOSIS — J9611 Chronic respiratory failure with hypoxia: Secondary | ICD-10-CM | POA: Diagnosis not present

## 2022-01-27 DIAGNOSIS — G309 Alzheimer's disease, unspecified: Secondary | ICD-10-CM

## 2022-01-27 DIAGNOSIS — I499 Cardiac arrhythmia, unspecified: Secondary | ICD-10-CM | POA: Diagnosis not present

## 2022-01-27 NOTE — Progress Notes (Signed)
Subjective:    Patient ID: Christopher Jenkins, male    DOB: 1926/12/18, 88 y.o.   MRN: [id number]  HPI Visit in assisted living apartment for follow up of chronic health conditions Wife is here also Reviewed status with Hardie Lora  Staff concerned about his care needs Bathing assistance 3 days per week Daily help with dressing and incontinence care Consideration for move to Memory Care or SNF  Has been having low oxygen regularly---high 80's No cough or SOB though No chest pain No sig edema No palpitations  No dizziness or syncope  Voids okay Stream is fair Occasional nocturia  Current Outpatient Medications on File Prior to Visit  Medication Sig Dispense Refill   apixaban (ELIQUIS) 5 MG TABS tablet TAKE ONE TABLET BY MOUTH TWICE DAILY 60 tablet 11   docusate sodium (COLACE) 100 MG capsule Take 2 capsules (200 mg total) by mouth at bedtime. 60 capsule 11   donepezil (ARICEPT) 10 MG tablet Take 10 mg by mouth at bedtime.     finasteride (PROSCAR) 5 MG tablet TAKE 1 TABLET BY MOUTH DAILY 90 tablet 3   furosemide (LASIX) 20 MG tablet TAKE 1 TABLET BY MOUTH DAILY IN THE MORNING. 90 tablet 3   Multiple Vitamins-Minerals (MULTIVITAMIN WITH MINERALS) tablet Take 1 tablet by mouth daily. At 1400     potassium chloride SA (KLOR-CON M) 20 MEQ tablet TAKE 1 TABLET BY MOUTH DAILY 30 tablet 11   tamsulosin (FLOMAX) 0.4 MG CAPS capsule TAKE 1 CAPSULE BY MOUTH ONCE DAILY 90 capsule 3   No current facility-administered medications on file prior to visit.    Allergies  Allergen Reactions   No Known Allergies     Past Medical History:  Diagnosis Date   BPH (benign prostatic hyperplasia)    Duodenal mass    Elevated PSA    Gross hematuria    Hyperlipidemia    Hypertension    Hypertrophy (benign) of prostate    asymptomatic on finasteride. will f/up with urologist [name]   Kidney lesion 07/2015   pt has appt at kidney specialist 08/05/15 to review CT scan.   Obesity     Overweight    Tubular adenoma of colon    Urinary retention     Past Surgical History:  Procedure Laterality Date   APPENDECTOMY     cataracts     PULMONARY THROMBECTOMY Bilateral 04/21/2021   Procedure: PULMONARY THROMBECTOMY;  Surgeon: [name], MD;  Location: [facility];  Service: Cardiovascular;  Laterality: Bilateral;   right knee surgery Right 12-17/2017   TOTAL HIP ARTHROPLASTY Right 08/13/2015   Procedure: TOTAL HIP ARTHROPLASTY;  Surgeon: [name], MD;  Location: [facility];  Service: Orthopedics;  Laterality: Right;   Uvula Surgery  1990    Family History  Problem Relation Age of Onset   Heart disease Mother    Stroke Mother    Heart disease Father    Kidney disease Neg Hx    Prostate cancer Neg Hx    Kidney cancer Neg Hx    Bladder Cancer Neg Hx     Social History   Socioeconomic History   Marital status: Married    Spouse name: Not on file   Number of children: 3   Years of education: Not on file   Highest education level: Not on file  Occupational History   Occupation: Retired  Tobacco Use   Smoking status: Never   Smokeless tobacco: Never  Substance and Sexual  Activity   Alcohol use: No    Comment: Occasional glass of wine 3-4 times a year.   Drug use: No   Sexual activity: Never  Other Topics Concern   Not on file  Social History Narrative   2 sons, 1 daughter      Living will?   Sons/daughter would be medical decision makers   Has DNR   No tube feeds if cognitively unaware   Social Determinants of Health   Financial Resource Strain: Low Risk  (02/23/2018)   Overall Financial Resource Strain (CARDIA)    Difficulty of Paying Living Expenses: Not hard at all  Food Insecurity: No Food Insecurity (02/23/2018)   Hunger Vital Sign    Worried About Running Out of Food in the Last Year: Never true    Ran Out of Food in the Last Year: Never true  Transportation Needs: No Transportation Needs (02/23/2018)   PRAPARE - Armed forces logistics/support/administrative officer (Medical): No    Lack of Transportation (Non-Medical): No  Physical Activity: Insufficiently Active (02/23/2018)   Exercise Vital Sign    Days of Exercise per Week: 5 days    Minutes of Exercise per Session: 20 min  Stress: No Stress Concern Present (02/23/2018)   [place]    Feeling of Stress : Not at all  Social Connections: Not on file  Intimate Partner Violence: Not At Risk (02/23/2018)   Humiliation, Afraid, Rape, and Kick questionnaire    Fear of Current or Ex-Partner: No    Emotionally Abused: No    Physically Abused: No    Sexually Abused: No   Review of Systems Appetite is good  Weight stable ---high BMI Sleeps well Bowels move fine No sig joint or back pains    Objective:   Physical Exam Constitutional:      Appearance: Normal appearance.  Cardiovascular:     Rate and Rhythm: Normal rate.     Heart sounds: No murmur heard.    No gallop.     Comments: Slightly irregular Pulmonary:     Effort: Pulmonary effort is normal.     Breath sounds: Normal breath sounds. No wheezing or rales.  Abdominal:     Palpations: Abdomen is soft.     Tenderness: There is no abdominal tenderness.  Musculoskeletal:     Cervical back: Neck supple.     Right lower leg: No edema.     Left lower leg: No edema.  Lymphadenopathy:     Cervical: No cervical adenopathy.  Skin:    Findings: No lesion.  Neurological:     General: No focal deficit present.     Mental Status: He is alert.  Psychiatric:        Mood and Affect: Mood normal.        Behavior: Behavior normal.            Assessment & Plan:

## 2022-01-27 NOTE — Assessment & Plan Note (Signed)
Seems to be compensated with lasix '20mg'$  daily

## 2022-01-27 NOTE — Assessment & Plan Note (Signed)
Increasing functional needs May need to go to [facility] or SNF

## 2022-01-27 NOTE — Assessment & Plan Note (Signed)
Suggestive of atrial fib Will check EKG---is on eliquis still from PE

## 2022-01-27 NOTE — Assessment & Plan Note (Signed)
Mildly low oximetry often No symptoms Will hold off on oxygen but going to pulmonary

## 2022-01-27 NOTE — Assessment & Plan Note (Signed)
Would probably stop eliquis at this point but has chronic hypoxia now Going to pulmonary--may need CT angio to assess Continue the eliquis for now

## 2022-01-31 DIAGNOSIS — R079 Chest pain, unspecified: Secondary | ICD-10-CM | POA: Diagnosis not present

## 2022-01-31 DIAGNOSIS — R9431 Abnormal electrocardiogram [ECG] [EKG]: Secondary | ICD-10-CM | POA: Diagnosis not present

## 2022-02-17 DIAGNOSIS — H903 Sensorineural hearing loss, bilateral: Secondary | ICD-10-CM | POA: Diagnosis not present

## 2022-02-17 DIAGNOSIS — H6123 Impacted cerumen, bilateral: Secondary | ICD-10-CM | POA: Diagnosis not present

## 2022-02-20 DIAGNOSIS — Z20828 Contact with and (suspected) exposure to other viral communicable diseases: Secondary | ICD-10-CM | POA: Diagnosis not present

## 2022-03-02 ENCOUNTER — Encounter: Payer: Self-pay | Admitting: Pulmonary Disease

## 2022-03-02 ENCOUNTER — Ambulatory Visit (INDEPENDENT_AMBULATORY_CARE_PROVIDER_SITE_OTHER): Payer: Medicare Other | Admitting: Pulmonary Disease

## 2022-03-02 ENCOUNTER — Other Ambulatory Visit: Payer: Self-pay | Admitting: *Deleted

## 2022-03-02 VITALS — BP 110/70 | HR 71 | Temp 98.5°F | Ht 68.0 in | Wt 248.4 lb

## 2022-03-02 DIAGNOSIS — R601 Generalized edema: Secondary | ICD-10-CM

## 2022-03-02 DIAGNOSIS — F0280 Dementia in other diseases classified elsewhere without behavioral disturbance: Secondary | ICD-10-CM | POA: Diagnosis not present

## 2022-03-02 DIAGNOSIS — Z86711 Personal history of pulmonary embolism: Secondary | ICD-10-CM | POA: Diagnosis not present

## 2022-03-02 DIAGNOSIS — I4819 Other persistent atrial fibrillation: Secondary | ICD-10-CM | POA: Diagnosis not present

## 2022-03-02 DIAGNOSIS — G309 Alzheimer's disease, unspecified: Secondary | ICD-10-CM | POA: Diagnosis not present

## 2022-03-02 DIAGNOSIS — J9611 Chronic respiratory failure with hypoxia: Secondary | ICD-10-CM

## 2022-03-02 DIAGNOSIS — I2699 Other pulmonary embolism without acute cor pulmonale: Secondary | ICD-10-CM

## 2022-03-02 NOTE — Progress Notes (Signed)
Subjective:    Patient ID: Christopher Jenkins, male    DOB: [birth year], 88 y.o.   MRN: [id number] Patient Care Team: Dewayne Shorter, MD as PCP - General (Family Medicine) Lauree Chandler, NP as Nurse Practitioner (Geriatric Medicine)  Chief Complaint  Patient presents with   Consult   HPI This is a 88 year old lifelong never smoker who presents for evaluation of hypoxia.  The patient is kindly referred by Dr. Viviana Simpler.  He was being evaluated as routine by the nursing home physician (he is at [facility]) and noted to have oxygen saturations between 85 to 90%.  Patient will not engage fully in history due to the fact that he has Alzheimer's dementia.  He states that he has "nothing wrong with him".  He does not endorse any chest pain, no fevers, chills or sweats.  No cough or sputum production.  However, it is hard to determine whether this is true as the patient has noted, has issues with dementia.  He does not appear to be distressed today.  He presents in a transport chair.  As noted he is a lifelong never smoker.  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  He served 2 years in the [employer] in the past only in the [employer] band.  I have reviewed his current medications.  He has a history of prior PE and DVT in the past.  He has atrial fibrillation that is persistent and is on Eliquis.   Review of Systems A 10 point review of systems was performed and it is as noted above otherwise negative.  Past Medical History:  Diagnosis Date   BPH (benign prostatic hyperplasia)    Duodenal mass    Elevated PSA    Gross hematuria    Hyperlipidemia    Hypertension    Hypertrophy (benign) of prostate    asymptomatic on finasteride. will f/up with urologist [name]   Kidney lesion 07/2015   pt has appt at kidney specialist 08/05/15 to review CT scan.   Obesity    Overweight    Tubular adenoma of colon    Urinary retention     Past Surgical History:  Procedure Laterality Date   APPENDECTOMY     cataracts     PULMONARY THROMBECTOMY Bilateral 04/21/2021   Procedure: PULMONARY THROMBECTOMY;  Surgeon: [name], MD;  Location: [place] [facility];  Service: Cardiovascular;  Laterality: Bilateral;   right knee surgery Right 12-17/2017   TOTAL HIP ARTHROPLASTY Right 08/13/2015   Procedure: TOTAL HIP ARTHROPLASTY;  Surgeon: [name], MD;  Location: [facility] ORS;  Service: Orthopedics;  Laterality: Right;   Uvula Surgery  1990   Patient Active Problem List   Diagnosis Date Noted   Chronic hypoxemic respiratory failure ([place]) 01/27/2022   Irregular cardiac rhythm 01/27/2022   Pulmonary embolism ([place]) 04/20/2021   CHF (congestive heart failure), NYHA class I, chronic, diastolic ([place]) [id number]   Aortic atherosclerosis ([place]) 08/06/2020   BPH with obstruction/lower urinary tract symptoms 03/12/2015   Constipation 05/07/2014   OSA (obstructive sleep apnea) 02/24/2013   Alzheimer's dementia without behavioral disturbance ([place]) 06/10/2012   Morbid obesity ([place]) 12/17/2011   Hyperlipidemia    Hypertension    Family History  Problem Relation Age of Onset   Heart disease Mother    Stroke Mother    Heart disease Father    Kidney disease Neg Hx    Prostate cancer Neg Hx  Kidney cancer Neg Hx    Bladder Cancer Neg Hx    Allergies  Allergen Reactions   No Known Allergies    Social History   Tobacco Use   Smoking status: Never   Smokeless tobacco: Never  Substance Use Topics   Alcohol use: No    Comment: Occasional glass of wine 3-4 times a year.   Current Meds  Medication Sig   apixaban (ELIQUIS) 5 MG TABS tablet TAKE ONE TABLET BY MOUTH TWICE DAILY   donepezil (ARICEPT) 10 MG tablet Take 10 mg by mouth at bedtime.   finasteride (PROSCAR) 5 MG tablet TAKE 1 TABLET BY MOUTH DAILY   furosemide (LASIX) 20 MG tablet TAKE 1 TABLET BY MOUTH DAILY IN THE MORNING. (Patient taking differently: Take 40 mg  by mouth daily.)   Multiple Vitamins-Minerals (MULTIVITAMIN WITH MINERALS) tablet Take 1 tablet by mouth daily. At 1400   potassium chloride SA (KLOR-CON M) 20 MEQ tablet TAKE 1 TABLET BY MOUTH DAILY   tamsulosin (FLOMAX) 0.4 MG CAPS capsule TAKE 1 CAPSULE BY MOUTH ONCE DAILY   Immunization History  Administered Date(s) Administered   Fluad Quad(high Dose 65+) 03/06/2019, 04/17/2020   Hep A / Hep B 03/17/2016   Influenza Split 04/03/2012, 03/12/2013   Influenza,inj,Quad PF,6+ Mos 02/24/2015   Influenza-Unspecified 03/29/2014, 03/08/2017, 03/09/2018   Pneumococcal Conjugate-13 05/07/2014   Pneumococcal Polysaccharide-23 03/26/2013, 04/12/2019   Tdap 07/29/2009, 05/31/2016   Zoster, Live 04/26/2013       Objective:   Physical Exam BP 110/70 (BP Location: Left Arm, Patient Position: Sitting, Cuff Size: Large)   Pulse 71   Temp 98.5 F (36.9 C) (Oral)   Ht '5\' 8"'$  (1.727 m)   Wt 248 lb 6.4 oz (112.7 kg)   SpO2 (!) 84% Comment: on RA, placed on 2L up to 91%  BMI 37.77 kg/m   SpO2: (!) 84 % (on RA, placed on 2L up to 91%) O2 Device: None (Room air)  GENERAL: Obese, well developed, presents in transport chair, no respiratory distress.  No conversational dyspnea. HEAD: Normocephalic, atraumatic.  EYES: Pupils equal, round, reactive to light.  No scleral icterus.  MOUTH: Oral mucosa moist.  No thrush. NECK: Supple. No thyromegaly. Trachea midline. No JVD.  No adenopathy. PULMONARY: Good air entry bilaterally.  Scattered wheezes at the upper lung zones. CARDIOVASCULAR: S1 and S2.  Irregular rate and rhythm.  ABDOMEN: Obese, otherwise benign. MUSCULOSKELETAL: No joint deformity, no clubbing, anasarca 2+.  NEUROLOGIC: Befuddled, no overt focal deficits SKIN: Intact,warm,dry. PSYCH: Pleasantly confused.  Ambulatory oximetry was performed today: At rest on room air saturations were 84%, heart rate was 71 bpm.  On 2 L/min patient was able to maintain oxygen saturations between 90 to  93%.  Patient will need oxygen at 2 L/min continuously.      Assessment & Plan:     ICD-10-CM   1. Chronic respiratory failure with hypoxia (HCC)  J96.11 Ambulatory Referral for DME   Will need oxygen at 2 L/min 4/7 Potential etiologies obesity with alveolar hypoventilation Query pulmonary hypertension    2. Persistent atrial fibrillation (HCC)  I48.19 EKG 12-Lead   This issue adds complexity to his management Adds to his issues with CHF etc.    3. Anasarca  R60.1 DG Chest 2 View    B Nat Peptide    Comp Met (CMET)    CBC w/Diff    CANCELED: CBC w/Diff    CANCELED: Comp Met (CMET)    CANCELED: B Nat Peptide  CANCELED: B Nat Peptide   Significant volume overload Suspect this is adding to his symptomatology    4. Alzheimer's dementia without behavioral disturbance (HCC)  G30.9    F02.80    This issue adds complexity to his management Poor insight this to his issues    5. History of pulmonary embolus (PE)  Z86.711    This adds complexity to his management Query PAH from prior pulmonary emboli     Orders Placed This Encounter  Procedures   DG Chest 2 View    Standing Status:   Future    Number of Occurrences:   1    Standing Expiration Date:   03/03/2023    Order Specific Question:   Reason for Exam (SYMPTOM  OR DIAGNOSIS REQUIRED)    Answer:   chronic respiratory failure with hypoxia    Order Specific Question:   Preferred imaging location?    Answer:   [facility]    Order Specific Question:   Radiology Contrast Protocol - do NOT remove file path    Answer:   [email or web address]   B Nat Peptide    Standing Status:   Future    Number of Occurrences:   1    Standing Expiration Date:   04/01/2022   Comp Met (CMET)    Standing Status:   Future    Number of Occurrences:   1    Standing Expiration Date:   04/01/2022   CBC w/Diff    Standing Status:   Future    Number of Occurrences:   1    Standing  Expiration Date:   04/01/2022   Ambulatory Referral for DME    Referral Priority:   Urgent    Referral Type:   Durable Medical Equipment Purchase    Number of Visits Requested:   1   EKG 12-Lead   It is difficult to determine why the patient has the degree of hypoxia he has.  Suspect that is multifactorial.  Obesity with alveolar hypoventilation a possibility, he does exhibit significant anasarca and volume overload may be a cause.  Appropriate studies have been ordered.  Patient does qualify for oxygen today and this order was placed.  We will see the patient in follow-up in 6 to 8 weeks time he is to call sooner should any new problems arise.  Renold Don, MD Advanced Bronchoscopy PCCM  [facility]    *This note was dictated using voice recognition software/Dragon.  Despite best efforts to proofread, errors can occur which can change the meaning. Any transcriptional errors that result from this process are unintentional and may not be fully corrected at the time of dictation.

## 2022-03-02 NOTE — Patient Instructions (Signed)
We have put in that he needs to have a CBC, complete metabolic panel and BNP.  Not sure if those requests went to [facility] however they were entered that way.  We are trying to coordinate a chest x-ray, twelve-lead EKG and echocardiogram for the same day at [facility].  We have written orders for oxygen he needs oxygen 24/7.  We will see him in follow-up in 6 to 8 weeks time call sooner should any new problems arise.

## 2022-03-07 ENCOUNTER — Other Ambulatory Visit: Payer: Self-pay | Admitting: Internal Medicine

## 2022-03-08 DIAGNOSIS — B351 Tinea unguium: Secondary | ICD-10-CM | POA: Diagnosis not present

## 2022-03-08 DIAGNOSIS — I7091 Generalized atherosclerosis: Secondary | ICD-10-CM | POA: Diagnosis not present

## 2022-03-14 ENCOUNTER — Ambulatory Visit: Admission: RE | Admit: 2022-03-14 | Payer: Medicare Other | Source: Ambulatory Visit

## 2022-03-16 ENCOUNTER — Non-Acute Institutional Stay (SKILLED_NURSING_FACILITY): Payer: Medicare Other | Admitting: Student

## 2022-03-16 ENCOUNTER — Encounter: Payer: Self-pay | Admitting: Student

## 2022-03-16 DIAGNOSIS — G4733 Obstructive sleep apnea (adult) (pediatric): Secondary | ICD-10-CM

## 2022-03-16 DIAGNOSIS — G309 Alzheimer's disease, unspecified: Secondary | ICD-10-CM | POA: Diagnosis not present

## 2022-03-16 DIAGNOSIS — N138 Other obstructive and reflux uropathy: Secondary | ICD-10-CM

## 2022-03-16 DIAGNOSIS — Z9981 Dependence on supplemental oxygen: Secondary | ICD-10-CM

## 2022-03-16 DIAGNOSIS — F0280 Dementia in other diseases classified elsewhere without behavioral disturbance: Secondary | ICD-10-CM

## 2022-03-16 DIAGNOSIS — I5032 Chronic diastolic (congestive) heart failure: Secondary | ICD-10-CM

## 2022-03-16 DIAGNOSIS — I10 Essential (primary) hypertension: Secondary | ICD-10-CM

## 2022-03-16 DIAGNOSIS — N401 Enlarged prostate with lower urinary tract symptoms: Secondary | ICD-10-CM | POA: Diagnosis not present

## 2022-03-16 DIAGNOSIS — J9611 Chronic respiratory failure with hypoxia: Secondary | ICD-10-CM | POA: Diagnosis not present

## 2022-03-16 NOTE — Progress Notes (Signed)
Location:  Other [facility]) Nursing Home Room Number: 110-A Place of Service:  ALF [id number]) Provider:  Dewayne Shorter, MD  Patient Care Team: Dewayne Shorter, MD as PCP - General (Family Medicine) Lauree Chandler, NP as Nurse Practitioner (Geriatric Medicine)  Extended Emergency Contact Information Primary Emergency Contact: Jamesetta So Address: [address]          [place], [place] [address] Johnnette Litter of [place] Phone: [phone] Relation: Spouse Secondary Emergency Contact: [place] Mobile Phone: [phone] Relation: Son  Code Status: DNAR Goals of care: Advanced Directive information    03/16/2022    9:49 AM  Advanced Directives  Does Patient Have a Medical Advance Directive? No  Would patient like information on creating a medical advance directive? No - Patient declined     Chief Complaint  Patient presents with   Medical Management of Chronic Issues    Routine visit. Discuss need for coivd boosters, shingrix, and flu vaccine or post pone if patient refuses.     HPI:  Pt is a 88 y.o. male seen today for medical management of chronic diseases.  Patient is moving to memory care unit due to additional support needed for ADLs. He was recently started on oxygen with pulmonology and was having difficulty managing on his own and was moved to memory care. His wife is in the room with him who he can identify.   He is able to state his full name and date of birth. He states it is [birth year] and he knows he is in [place], however, cannot identify [place]. Denies smoking or drinking.   Over the last few months to years he has continued to worsen from a  memory standpoint.   Spoke with his daughter [name] and SIL. Their other concern is his weight. Over the last few years he has put on 40 lbs and his legs are swollen. They are wondering if there is anything that can be done to help. Their primary concern has been his weight gain. Consistent cab diety to  help with weight management. He is needing more help now. They agree with PT/OT as well. He has had decline in his functional status-- wined after a short distance. He was playing Tennis into his 79's and now he is pretty rapidly declining in every way.    Past Medical History:  Diagnosis Date   Acute respiratory failure with hypoxia ([place])    Per [facility]   BPH (benign prostatic hyperplasia)    Duodenal mass    Elevated PSA    Fatty (change of) liver, not elsewhere classified    Per [facility]   Gross hematuria    Heart disease, unspecified    Per [facility]   Hyperlipidemia    Hypertension    Hypertrophy (benign) of prostate    asymptomatic on finasteride. will f/up with urologist [name]   Kidney lesion 07/22/2015   pt has appt at kidney specialist 08/05/15 to review CT scan.   Late onset Alzheimer disease ([place])    Per [facility]   Obesity    Overweight    Peripheral vascular disease, unspecified ([place])    Per [facility]   Tubular adenoma of colon    Urinary retention    Past Surgical History:  Procedure Laterality Date   APPENDECTOMY     cataracts     PULMONARY THROMBECTOMY Bilateral 04/21/2021   Procedure: PULMONARY THROMBECTOMY;  Surgeon: [name], MD;  Location: [place] [facility];  Service: Cardiovascular;  Laterality: Bilateral;   right knee surgery Right 12-17/2017   TOTAL HIP ARTHROPLASTY Right 08/13/2015   Procedure: TOTAL HIP ARTHROPLASTY;  Surgeon: [name], MD;  Location: [facility] ORS;  Service: Orthopedics;  Laterality: Right;   Uvula Surgery  1990    Allergies  Allergen Reactions   No Known Allergies     Outpatient Encounter Medications as of 03/16/2022  Medication Sig   apixaban (ELIQUIS) 5 MG TABS tablet TAKE ONE TABLET BY MOUTH TWICE DAILY   docusate sodium (COLACE) 100 MG capsule Take 2 capsules (200 mg total) by mouth at bedtime.   donepezil (ARICEPT) 10 MG tablet Take 5 mg by mouth at bedtime.   finasteride (PROSCAR) 5 MG tablet  TAKE 1 TABLET BY MOUTH DAILY   furosemide (LASIX) 40 MG tablet Take 40 mg by mouth daily.   Multiple Vitamins-Minerals (MULTIVITAMIN WITH MINERALS) tablet Take 1 tablet by mouth daily. At 1400   potassium chloride SA (KLOR-CON M) 20 MEQ tablet TAKE 1 TABLET BY MOUTH DAILY   tamsulosin (FLOMAX) 0.4 MG CAPS capsule TAKE 1 CAPSULE BY MOUTH ONCE DAILY   [DISCONTINUED] furosemide (LASIX) 20 MG tablet TAKE 1 TABLET BY MOUTH DAILY IN THE MORNING. (Patient taking differently: Take 40 mg by mouth daily.)   No facility-administered encounter medications on file as of 03/16/2022.    Review of Systems  Unable to perform ROS: Dementia  Constitutional:  Positive for unexpected weight change.       Family states he has had significant weight gain  Respiratory:  Negative for shortness of breath.   Cardiovascular:  Negative for chest pain.  Gastrointestinal:  Negative for abdominal pain.  Allergic/Immunologic: Food allergies: dementia.    Immunization History  Administered Date(s) Administered   Fluad Quad(high Dose 65+) 03/06/2019, 04/17/2020   Hep A / Hep B 03/17/2016   Influenza Split 04/03/2012, 03/12/2013   Influenza,inj,Quad PF,6+ Mos 02/24/2015   Influenza-Unspecified 03/29/2014, 03/08/2017, 03/09/2018   Pneumococcal Conjugate-13 05/07/2014   Pneumococcal Polysaccharide-23 03/26/2013, 04/12/2019   Tdap 07/29/2009, 05/31/2016   Zoster, Live 04/26/2013   Pertinent  Health Maintenance Due  Topic Date Due   INFLUENZA VACCINE  01/18/2022      04/21/2021    2:15 PM 04/21/2021    8:00 PM 04/22/2021    7:35 AM 04/22/2021   10:00 PM 04/23/2021    7:45 AM  Fall Risk  Patient Fall Risk Level High fall risk High fall risk High fall risk High fall risk High fall risk   Functional Status Survey:    Vitals:   03/16/22 0935  BP: 130/68  Pulse: (!) 59  Resp: 20  Temp: 97.8 F (36.6 C)  SpO2: 98%   There is no height or weight on file to calculate BMI. Physical Exam Constitutional:       Appearance: He is obese.  Cardiovascular:     Rate and Rhythm: Normal rate. Rhythm irregular.  Pulmonary:     Effort: Pulmonary effort is normal.     Breath sounds: Normal breath sounds.  Abdominal:     General: Bowel sounds are normal.     Palpations: Abdomen is soft.  Musculoskeletal:     Comments: 2+ pitting edema bilaterally   Skin:    General: Skin is warm.  Neurological:     Mental Status: He is alert.    Labs reviewed: Recent Labs    04/20/21 1510 04/21/21 0606 04/23/21 0552  NA 140 139 140  K 4.8 4.5 4.5  CL 104  103 102  CO2 26 29 33*  GLUCOSE 137* 108* 95  BUN 27* 24* 20  CREATININE 1.22 1.10 0.99  CALCIUM 8.8* 8.5* 8.4*   Recent Labs    04/20/21 1510  AST 24  ALT 23  ALKPHOS 94  BILITOT 0.8  PROT 6.1*  ALBUMIN 3.1*   Recent Labs    04/21/21 0606 04/22/21 0612 04/23/21 0552  WBC 9.3 9.5 7.9  HGB 14.5 13.7 13.4  HCT 44.1 43.1 42.5  MCV 98.9 99.8 99.3  PLT 149* 152 167   Lab Results  Component Value Date   TSH 1.98 04/05/2017   Lab Results  Component Value Date   HGBA1C 5.5 04/12/2019   Lab Results  Component Value Date   CHOL 109 04/17/2020   HDL 38 (L) 04/17/2020   LDLCALC 47 04/17/2020   LDLDIRECT 109.0 04/12/2019   TRIG 160 (H) 04/17/2020   CHOLHDL 2.9 04/17/2020    Significant Diagnostic Results in last 30 days:  No results found.  Assessment/Plan 1. Alzheimer's dementia without behavioral disturbance [facility]) Patient has had worsening symptoms in the last few years since becoming a caregiver for his wife who was first diagnosed with [place] dementia.  Patient is now requiring support with urination and bowel movements, bathing, self grooming.  He has also had intermittent difficulties with maintaining a normal heart rate, thought to be potentially due to his donepezil.  We will plan to decrease donepezil to 5 mg daily and reassess in a few weeks.  If still not improving will discontinue them completely.  At this time patient has a  fast score of 6c, and he is not appropriate for hospice at this time.  He does not seem to have medication that could be contributing to his memory changes, will continue to monitor and agree with patient's transition to memory care at this time.  2. Chronic respiratory failure with hypoxia, on home oxygen therapy [facility]) Patient was recently seen with pulmonology on 03/02/2022 and at that time told to have continuous oxygen therapy.  Patient was unable to manage in assisted living and was transition to higher level of care memory care.  Patient to follow-up with pulmonology in 4 weeks.  3. OSA (obstructive sleep apnea) No CPAP at this time continue to monitor for symptoms.  Patient maintaining normal oxygen levels throughout the night.  Has chronic oxygen use per outlined above.  4. CHF (congestive heart failure), NYHA class I, chronic, diastolic (HCC) Patient with history of congestive heart failure now on chronic oxygen.  2+ pitting edema bilaterally.  Will collect BNP, chest x-ray two-view, EKG.  Pulmonology previously ordered echocardiogram will review about your weight patient's cardiac function at that time.  Until then we will request a consistent carb diet in an effort to minimize weight gain and water retention.  We will adjust medications for diuresis as indicated per labs and imaging studies.  5. Primary hypertension Patient's blood pressure within normal limits without treatment at this time continue to monitor.  6. BPH with obstruction/lower urinary tract symptoms Patient symptoms well controlled on 0.4 mg of tamsulosin nightly.  7. Morbid obesity ([place]) Weight gain obscured with patient's history of heart failure, however some concern that he has had weight gain since living to assisted living.  Discussed with family concern the patient could have been self administering snacks without regular mealtimes.  Plan to start consistent carb diet and we will continue to monitor.  We will  collect A1c today and CBC to determine  any nutritional changes that need to begin.    Family/ staff Communication: Spoke with [name] and SIL.   Labs/tests ordered:  CBC, CMP, BNP, A1c - of note, echocardiogram ordered by pulmonologist. She also requested a 12-lead ECG which had not been done.  Tomasa Rand, MD, [facility] [phone]

## 2022-03-17 DIAGNOSIS — E669 Obesity, unspecified: Secondary | ICD-10-CM | POA: Diagnosis not present

## 2022-03-17 DIAGNOSIS — I519 Heart disease, unspecified: Secondary | ICD-10-CM | POA: Diagnosis not present

## 2022-03-17 DIAGNOSIS — R0602 Shortness of breath: Secondary | ICD-10-CM | POA: Diagnosis not present

## 2022-03-17 DIAGNOSIS — E119 Type 2 diabetes mellitus without complications: Secondary | ICD-10-CM | POA: Diagnosis not present

## 2022-03-18 LAB — HEPATIC FUNCTION PANEL
ALT: 14 U/L (ref 10–40)
AST: 16 (ref 14–40)
Alkaline Phosphatase: 105 (ref 25–125)
Bilirubin, Total: 0.4

## 2022-03-18 LAB — CBC AND DIFFERENTIAL
HCT: 46 (ref 41–53)
Hemoglobin: 15.1 (ref 13.5–17.5)
Neutrophils Absolute: 5963
Platelets: 203 10*3/uL (ref 150–400)
WBC: 8.9

## 2022-03-18 LAB — COMPREHENSIVE METABOLIC PANEL
Albumin: 3.5 (ref 3.5–5.0)
Calcium: 9.5 (ref 8.7–10.7)
Globulin: 2.4
eGFR: 71

## 2022-03-18 LAB — BASIC METABOLIC PANEL
BUN: 15 (ref 4–21)
CO2: 36 — AB (ref 13–22)
Chloride: 99 (ref 99–108)
Creatinine: 1 (ref 0.6–1.3)
Glucose: 110
Potassium: 4.2 mEq/L (ref 3.5–5.1)
Sodium: 140 (ref 137–147)

## 2022-03-18 LAB — CBC: RBC: 4.87 (ref 3.87–5.11)

## 2022-03-18 LAB — HEMOGLOBIN A1C: Hemoglobin A1C: 5.7

## 2022-03-21 ENCOUNTER — Telehealth (SKILLED_NURSING_FACILITY): Payer: Medicare Other | Admitting: Student

## 2022-03-21 DIAGNOSIS — J9601 Acute respiratory failure with hypoxia: Secondary | ICD-10-CM | POA: Diagnosis not present

## 2022-03-21 DIAGNOSIS — I499 Cardiac arrhythmia, unspecified: Secondary | ICD-10-CM

## 2022-03-21 DIAGNOSIS — I509 Heart failure, unspecified: Secondary | ICD-10-CM | POA: Diagnosis not present

## 2022-03-21 DIAGNOSIS — G309 Alzheimer's disease, unspecified: Secondary | ICD-10-CM | POA: Diagnosis not present

## 2022-03-21 DIAGNOSIS — I10 Essential (primary) hypertension: Secondary | ICD-10-CM | POA: Diagnosis not present

## 2022-03-21 DIAGNOSIS — R2681 Unsteadiness on feet: Secondary | ICD-10-CM | POA: Diagnosis not present

## 2022-03-21 DIAGNOSIS — M6281 Muscle weakness (generalized): Secondary | ICD-10-CM | POA: Diagnosis not present

## 2022-03-21 DIAGNOSIS — R2689 Other abnormalities of gait and mobility: Secondary | ICD-10-CM | POA: Diagnosis not present

## 2022-03-21 DIAGNOSIS — F0280 Dementia in other diseases classified elsewhere without behavioral disturbance: Secondary | ICD-10-CM | POA: Diagnosis not present

## 2022-03-21 DIAGNOSIS — R4189 Other symptoms and signs involving cognitive functions and awareness: Secondary | ICD-10-CM | POA: Diagnosis not present

## 2022-03-21 DIAGNOSIS — Z741 Need for assistance with personal care: Secondary | ICD-10-CM | POA: Diagnosis not present

## 2022-03-21 NOTE — Telephone Encounter (Addendum)
Recevied call as on call provider that patient had low HR overnight sustained for an hour. Did not receive a call during the time of the event. All other vital signs stable. Upon recheck, HR in the 60s.   BP: 121/61 mmHg 03/20/2022 13:34 Temp:98 F 03/16/2022 22:36 Pulse:67 bpm 03/21/2022 10:53 Weight:246.2 Lbs 03/21/2022 13:34 Resp:18 Breaths/min 03/16/2022 22:36 BS: O2:91 % 03/21/2022 10:52 Pain:0 03/21/2022 13:12  Attempted to call family to determine next steps whether to have cardiology referral vs comfort measures. Patient is likely a candidate for a pacemaker, however, ECG has been delayed. Discussed with nursing, the scheduler for ECG and CXR stated he already has an echocardiogram ordered and would not reschedule.  Ordering a cardiology referral an zio patch today in an effor tot prevent further delays while attempting contact with family for [place] conversations.   Tomasa Rand, MD, [facility] [phone]

## 2022-03-22 DIAGNOSIS — I509 Heart failure, unspecified: Secondary | ICD-10-CM | POA: Diagnosis not present

## 2022-03-22 DIAGNOSIS — F0280 Dementia in other diseases classified elsewhere without behavioral disturbance: Secondary | ICD-10-CM | POA: Diagnosis not present

## 2022-03-22 DIAGNOSIS — G309 Alzheimer's disease, unspecified: Secondary | ICD-10-CM | POA: Diagnosis not present

## 2022-03-22 DIAGNOSIS — J9601 Acute respiratory failure with hypoxia: Secondary | ICD-10-CM | POA: Diagnosis not present

## 2022-03-22 DIAGNOSIS — I10 Essential (primary) hypertension: Secondary | ICD-10-CM | POA: Diagnosis not present

## 2022-03-22 DIAGNOSIS — M6281 Muscle weakness (generalized): Secondary | ICD-10-CM | POA: Diagnosis not present

## 2022-03-24 DIAGNOSIS — F0280 Dementia in other diseases classified elsewhere without behavioral disturbance: Secondary | ICD-10-CM | POA: Diagnosis not present

## 2022-03-24 DIAGNOSIS — I10 Essential (primary) hypertension: Secondary | ICD-10-CM | POA: Diagnosis not present

## 2022-03-24 DIAGNOSIS — I509 Heart failure, unspecified: Secondary | ICD-10-CM | POA: Diagnosis not present

## 2022-03-24 DIAGNOSIS — M6281 Muscle weakness (generalized): Secondary | ICD-10-CM | POA: Diagnosis not present

## 2022-03-24 DIAGNOSIS — G309 Alzheimer's disease, unspecified: Secondary | ICD-10-CM | POA: Diagnosis not present

## 2022-03-24 DIAGNOSIS — J9601 Acute respiratory failure with hypoxia: Secondary | ICD-10-CM | POA: Diagnosis not present

## 2022-03-25 ENCOUNTER — Telehealth (SKILLED_NURSING_FACILITY): Payer: Medicare Other | Admitting: Student

## 2022-03-25 DIAGNOSIS — Z7189 Other specified counseling: Secondary | ICD-10-CM

## 2022-03-25 DIAGNOSIS — G309 Alzheimer's disease, unspecified: Secondary | ICD-10-CM | POA: Diagnosis not present

## 2022-03-25 DIAGNOSIS — I509 Heart failure, unspecified: Secondary | ICD-10-CM | POA: Diagnosis not present

## 2022-03-25 DIAGNOSIS — F0280 Dementia in other diseases classified elsewhere without behavioral disturbance: Secondary | ICD-10-CM

## 2022-03-25 DIAGNOSIS — I5032 Chronic diastolic (congestive) heart failure: Secondary | ICD-10-CM | POA: Diagnosis not present

## 2022-03-25 DIAGNOSIS — I10 Essential (primary) hypertension: Secondary | ICD-10-CM | POA: Diagnosis not present

## 2022-03-25 DIAGNOSIS — J9601 Acute respiratory failure with hypoxia: Secondary | ICD-10-CM | POA: Diagnosis not present

## 2022-03-25 DIAGNOSIS — M6281 Muscle weakness (generalized): Secondary | ICD-10-CM | POA: Diagnosis not present

## 2022-03-25 NOTE — Telephone Encounter (Signed)
Called Daughter [name] [phone] to discuss next steps for patient.   Discussed his most recent labs CMP, BNP and CBC were all normal. He has an echocardiogram, ECG and CXR ordered for Monday. Labs abstracted from [facility].   They agree with Consultation to cardiology given his bradycardia evaluate for procedure (pace maker). Of note, Patient does have a history of delirium after a procedure with sedation. They would not want an invasive procedure, however, if it is minimally invasive and vascular would be open to discussion.   Discussed he has the option to do the procedure or conservatively manage without any intervention, but they said they would like to have the consulation evaluation before deciding not to intervene. Reassured that patient's BP has remained normal despite these changes in HR. Discussed that if his other VSS were unstable would be an indication to send to the hospital which they would like if it is indicated. Patient maintains a DNR status.   Spent 20 minutes dicussing current status, education, care plan, and goals of care.   Tomasa Rand, MD, [facility] [phone]

## 2022-03-28 ENCOUNTER — Other Ambulatory Visit
Admission: RE | Admit: 2022-03-28 | Discharge: 2022-03-28 | Disposition: A | Discharge status: Home / Self Care | Payer: Medicare Other | Source: Ambulatory Visit | Attending: Pulmonary Disease | Admitting: Pulmonary Disease

## 2022-03-28 ENCOUNTER — Ambulatory Visit
Admission: RE | Admit: 2022-03-28 | Discharge: 2022-03-28 | Disposition: A | Discharge status: Home / Self Care | Payer: Medicare Other | Source: Ambulatory Visit | Attending: Pulmonary Disease | Admitting: Pulmonary Disease

## 2022-03-28 DIAGNOSIS — I119 Hypertensive heart disease without heart failure: Secondary | ICD-10-CM | POA: Insufficient documentation

## 2022-03-28 DIAGNOSIS — I2699 Other pulmonary embolism without acute cor pulmonale: Secondary | ICD-10-CM | POA: Diagnosis not present

## 2022-03-28 DIAGNOSIS — R0902 Hypoxemia: Secondary | ICD-10-CM | POA: Diagnosis not present

## 2022-03-28 DIAGNOSIS — I4891 Unspecified atrial fibrillation: Secondary | ICD-10-CM | POA: Diagnosis not present

## 2022-03-28 DIAGNOSIS — R601 Generalized edema: Secondary | ICD-10-CM | POA: Insufficient documentation

## 2022-03-28 DIAGNOSIS — I4819 Other persistent atrial fibrillation: Secondary | ICD-10-CM | POA: Insufficient documentation

## 2022-03-28 DIAGNOSIS — E785 Hyperlipidemia, unspecified: Secondary | ICD-10-CM | POA: Diagnosis not present

## 2022-03-28 DIAGNOSIS — J9690 Respiratory failure, unspecified, unspecified whether with hypoxia or hypercapnia: Secondary | ICD-10-CM | POA: Diagnosis not present

## 2022-03-28 DIAGNOSIS — I739 Peripheral vascular disease, unspecified: Secondary | ICD-10-CM | POA: Insufficient documentation

## 2022-03-28 DIAGNOSIS — R0602 Shortness of breath: Secondary | ICD-10-CM | POA: Diagnosis not present

## 2022-03-28 DIAGNOSIS — I371 Nonrheumatic pulmonary valve insufficiency: Secondary | ICD-10-CM | POA: Diagnosis not present

## 2022-03-28 LAB — CBC WITH DIFFERENTIAL/PLATELET
Abs Immature Granulocytes: 0.04 10*3/uL (ref 0.00–0.07)
Basophils Absolute: 0 10*3/uL (ref 0.0–0.1)
Basophils Relative: 1 %
Eosinophils Absolute: 0.1 10*3/uL (ref 0.0–0.5)
Eosinophils Relative: 1 %
HCT: 48.3 % (ref 39.0–52.0)
Hemoglobin: 15.5 g/dL (ref 13.0–17.0)
Immature Granulocytes: 1 %
Lymphocytes Relative: 30 %
Lymphs Abs: 2.5 10*3/uL (ref 0.7–4.0)
MCH: 30.9 pg (ref 26.0–34.0)
MCHC: 32.1 g/dL (ref 30.0–36.0)
MCV: 96.4 fL (ref 80.0–100.0)
Monocytes Absolute: 0.8 10*3/uL (ref 0.1–1.0)
Monocytes Relative: 10 %
Neutro Abs: 4.8 10*3/uL (ref 1.7–7.7)
Neutrophils Relative %: 57 %
Platelets: 219 10*3/uL (ref 150–400)
RBC: 5.01 MIL/uL (ref 4.22–5.81)
RDW: 13.3 % (ref 11.5–15.5)
WBC: 8.2 10*3/uL (ref 4.0–10.5)
nRBC: 0 % (ref 0.0–0.2)

## 2022-03-28 LAB — COMPREHENSIVE METABOLIC PANEL
ALT: 16 U/L (ref 0–44)
AST: 24 U/L (ref 15–41)
Albumin: 3.5 g/dL (ref 3.5–5.0)
Alkaline Phosphatase: 93 U/L (ref 38–126)
Anion gap: 11 (ref 5–15)
BUN: 23 mg/dL (ref 8–23)
CO2: 30 mmol/L (ref 22–32)
Calcium: 8.7 mg/dL — ABNORMAL LOW (ref 8.9–10.3)
Chloride: 99 mmol/L (ref 98–111)
Creatinine, Ser: 1.19 mg/dL (ref 0.61–1.24)
GFR, Estimated: 57 mL/min — ABNORMAL LOW (ref >60)
Glucose, Bld: 98 mg/dL (ref 70–99)
Potassium: 4.4 mmol/L (ref 3.5–5.1)
Sodium: 140 mmol/L (ref 135–145)
Total Bilirubin: 0.8 mg/dL (ref 0.3–1.2)
Total Protein: 6.6 g/dL (ref 6.5–8.1)

## 2022-03-28 LAB — ECHOCARDIOGRAM COMPLETE
AR max vel: 2.82 cm2
AV Area VTI: 2.77 cm2
AV Area mean vel: 2.64 cm2
AV Mean grad: 7 mmHg
AV Peak grad: 12.7 mmHg
Ao pk vel: 1.78 m/s
Area-P 1/2: 2.8 cm2
S' Lateral: 3.2 cm
Single Plane A4C EF: 41.5 %

## 2022-03-28 LAB — BRAIN NATRIURETIC PEPTIDE: B Natriuretic Peptide: 165.6 pg/mL — ABNORMAL HIGH (ref 0.0–100.0)

## 2022-03-28 NOTE — Progress Notes (Signed)
*  PRELIMINARY RESULTS* Echocardiogram 2D Echocardiogram has been performed.  [name] 03/28/2022, 10:48 AM

## 2022-03-29 DIAGNOSIS — F0280 Dementia in other diseases classified elsewhere without behavioral disturbance: Secondary | ICD-10-CM | POA: Diagnosis not present

## 2022-03-29 DIAGNOSIS — J9601 Acute respiratory failure with hypoxia: Secondary | ICD-10-CM | POA: Diagnosis not present

## 2022-03-29 DIAGNOSIS — G309 Alzheimer's disease, unspecified: Secondary | ICD-10-CM | POA: Diagnosis not present

## 2022-03-29 DIAGNOSIS — M6281 Muscle weakness (generalized): Secondary | ICD-10-CM | POA: Diagnosis not present

## 2022-03-29 DIAGNOSIS — I509 Heart failure, unspecified: Secondary | ICD-10-CM | POA: Diagnosis not present

## 2022-03-29 DIAGNOSIS — I10 Essential (primary) hypertension: Secondary | ICD-10-CM | POA: Diagnosis not present

## 2022-03-30 DIAGNOSIS — M6281 Muscle weakness (generalized): Secondary | ICD-10-CM | POA: Diagnosis not present

## 2022-03-30 DIAGNOSIS — I509 Heart failure, unspecified: Secondary | ICD-10-CM | POA: Diagnosis not present

## 2022-03-30 DIAGNOSIS — I10 Essential (primary) hypertension: Secondary | ICD-10-CM | POA: Diagnosis not present

## 2022-03-30 DIAGNOSIS — J9601 Acute respiratory failure with hypoxia: Secondary | ICD-10-CM | POA: Diagnosis not present

## 2022-03-30 DIAGNOSIS — F0280 Dementia in other diseases classified elsewhere without behavioral disturbance: Secondary | ICD-10-CM | POA: Diagnosis not present

## 2022-03-30 DIAGNOSIS — G309 Alzheimer's disease, unspecified: Secondary | ICD-10-CM | POA: Diagnosis not present

## 2022-03-31 DIAGNOSIS — I10 Essential (primary) hypertension: Secondary | ICD-10-CM | POA: Diagnosis not present

## 2022-03-31 DIAGNOSIS — I509 Heart failure, unspecified: Secondary | ICD-10-CM | POA: Diagnosis not present

## 2022-03-31 DIAGNOSIS — G309 Alzheimer's disease, unspecified: Secondary | ICD-10-CM | POA: Diagnosis not present

## 2022-03-31 DIAGNOSIS — F0280 Dementia in other diseases classified elsewhere without behavioral disturbance: Secondary | ICD-10-CM | POA: Diagnosis not present

## 2022-03-31 DIAGNOSIS — M6281 Muscle weakness (generalized): Secondary | ICD-10-CM | POA: Diagnosis not present

## 2022-03-31 DIAGNOSIS — J9601 Acute respiratory failure with hypoxia: Secondary | ICD-10-CM | POA: Diagnosis not present

## 2022-04-01 ENCOUNTER — Encounter: Payer: Self-pay | Admitting: Student

## 2022-04-01 ENCOUNTER — Telehealth: Payer: Self-pay

## 2022-04-01 DIAGNOSIS — J9601 Acute respiratory failure with hypoxia: Secondary | ICD-10-CM | POA: Diagnosis not present

## 2022-04-01 DIAGNOSIS — G309 Alzheimer's disease, unspecified: Secondary | ICD-10-CM | POA: Diagnosis not present

## 2022-04-01 DIAGNOSIS — I509 Heart failure, unspecified: Secondary | ICD-10-CM | POA: Diagnosis not present

## 2022-04-01 DIAGNOSIS — M6281 Muscle weakness (generalized): Secondary | ICD-10-CM | POA: Diagnosis not present

## 2022-04-01 DIAGNOSIS — I10 Essential (primary) hypertension: Secondary | ICD-10-CM | POA: Diagnosis not present

## 2022-04-01 DIAGNOSIS — F0280 Dementia in other diseases classified elsewhere without behavioral disturbance: Secondary | ICD-10-CM | POA: Diagnosis not present

## 2022-04-01 NOTE — Telephone Encounter (Signed)
Patient's son-in-law [name]) and daughter called requesting to speak with Dr.Victoria Beamer about patient referral to cardiologist. They was advised patient received a referral and appointment was made with patient. Advised them a MyChart message could be send to Dr.Beamer and she would be able to answer any questions they may have.

## 2022-04-05 ENCOUNTER — Telehealth: Payer: Self-pay | Admitting: *Deleted

## 2022-04-05 DIAGNOSIS — G309 Alzheimer's disease, unspecified: Secondary | ICD-10-CM | POA: Diagnosis not present

## 2022-04-05 DIAGNOSIS — I509 Heart failure, unspecified: Secondary | ICD-10-CM | POA: Diagnosis not present

## 2022-04-05 DIAGNOSIS — M6281 Muscle weakness (generalized): Secondary | ICD-10-CM | POA: Diagnosis not present

## 2022-04-05 DIAGNOSIS — I10 Essential (primary) hypertension: Secondary | ICD-10-CM | POA: Diagnosis not present

## 2022-04-05 DIAGNOSIS — F0280 Dementia in other diseases classified elsewhere without behavioral disturbance: Secondary | ICD-10-CM | POA: Diagnosis not present

## 2022-04-05 DIAGNOSIS — J9601 Acute respiratory failure with hypoxia: Secondary | ICD-10-CM | POA: Diagnosis not present

## 2022-04-05 NOTE — Telephone Encounter (Signed)
Patient no longer lives at [facility]. I have spoken to patient, daughter and son in law. Please see echo result note.  Nothing further needed.

## 2022-04-06 DIAGNOSIS — F0280 Dementia in other diseases classified elsewhere without behavioral disturbance: Secondary | ICD-10-CM | POA: Diagnosis not present

## 2022-04-06 DIAGNOSIS — I10 Essential (primary) hypertension: Secondary | ICD-10-CM | POA: Diagnosis not present

## 2022-04-06 DIAGNOSIS — J9601 Acute respiratory failure with hypoxia: Secondary | ICD-10-CM | POA: Diagnosis not present

## 2022-04-06 DIAGNOSIS — G309 Alzheimer's disease, unspecified: Secondary | ICD-10-CM | POA: Diagnosis not present

## 2022-04-06 DIAGNOSIS — I509 Heart failure, unspecified: Secondary | ICD-10-CM | POA: Diagnosis not present

## 2022-04-06 DIAGNOSIS — M6281 Muscle weakness (generalized): Secondary | ICD-10-CM | POA: Diagnosis not present

## 2022-04-07 DIAGNOSIS — F0280 Dementia in other diseases classified elsewhere without behavioral disturbance: Secondary | ICD-10-CM | POA: Diagnosis not present

## 2022-04-07 DIAGNOSIS — M6281 Muscle weakness (generalized): Secondary | ICD-10-CM | POA: Diagnosis not present

## 2022-04-07 DIAGNOSIS — I509 Heart failure, unspecified: Secondary | ICD-10-CM | POA: Diagnosis not present

## 2022-04-07 DIAGNOSIS — I10 Essential (primary) hypertension: Secondary | ICD-10-CM | POA: Diagnosis not present

## 2022-04-07 DIAGNOSIS — J9601 Acute respiratory failure with hypoxia: Secondary | ICD-10-CM | POA: Diagnosis not present

## 2022-04-07 DIAGNOSIS — G309 Alzheimer's disease, unspecified: Secondary | ICD-10-CM | POA: Diagnosis not present

## 2022-04-08 DIAGNOSIS — I509 Heart failure, unspecified: Secondary | ICD-10-CM | POA: Diagnosis not present

## 2022-04-08 DIAGNOSIS — J9601 Acute respiratory failure with hypoxia: Secondary | ICD-10-CM | POA: Diagnosis not present

## 2022-04-08 DIAGNOSIS — F0280 Dementia in other diseases classified elsewhere without behavioral disturbance: Secondary | ICD-10-CM | POA: Diagnosis not present

## 2022-04-08 DIAGNOSIS — M6281 Muscle weakness (generalized): Secondary | ICD-10-CM | POA: Diagnosis not present

## 2022-04-08 DIAGNOSIS — I10 Essential (primary) hypertension: Secondary | ICD-10-CM | POA: Diagnosis not present

## 2022-04-08 DIAGNOSIS — G309 Alzheimer's disease, unspecified: Secondary | ICD-10-CM | POA: Diagnosis not present

## 2022-04-11 DIAGNOSIS — I10 Essential (primary) hypertension: Secondary | ICD-10-CM | POA: Diagnosis not present

## 2022-04-11 DIAGNOSIS — F0280 Dementia in other diseases classified elsewhere without behavioral disturbance: Secondary | ICD-10-CM | POA: Diagnosis not present

## 2022-04-11 DIAGNOSIS — J9601 Acute respiratory failure with hypoxia: Secondary | ICD-10-CM | POA: Diagnosis not present

## 2022-04-11 DIAGNOSIS — I509 Heart failure, unspecified: Secondary | ICD-10-CM | POA: Diagnosis not present

## 2022-04-11 DIAGNOSIS — M6281 Muscle weakness (generalized): Secondary | ICD-10-CM | POA: Diagnosis not present

## 2022-04-11 DIAGNOSIS — G309 Alzheimer's disease, unspecified: Secondary | ICD-10-CM | POA: Diagnosis not present

## 2022-04-13 DIAGNOSIS — J9601 Acute respiratory failure with hypoxia: Secondary | ICD-10-CM | POA: Diagnosis not present

## 2022-04-13 DIAGNOSIS — G309 Alzheimer's disease, unspecified: Secondary | ICD-10-CM | POA: Diagnosis not present

## 2022-04-13 DIAGNOSIS — M6281 Muscle weakness (generalized): Secondary | ICD-10-CM | POA: Diagnosis not present

## 2022-04-13 DIAGNOSIS — I509 Heart failure, unspecified: Secondary | ICD-10-CM | POA: Diagnosis not present

## 2022-04-13 DIAGNOSIS — I10 Essential (primary) hypertension: Secondary | ICD-10-CM | POA: Diagnosis not present

## 2022-04-13 DIAGNOSIS — F0280 Dementia in other diseases classified elsewhere without behavioral disturbance: Secondary | ICD-10-CM | POA: Diagnosis not present

## 2022-04-15 DIAGNOSIS — M6281 Muscle weakness (generalized): Secondary | ICD-10-CM | POA: Diagnosis not present

## 2022-04-15 DIAGNOSIS — J9601 Acute respiratory failure with hypoxia: Secondary | ICD-10-CM | POA: Diagnosis not present

## 2022-04-15 DIAGNOSIS — I10 Essential (primary) hypertension: Secondary | ICD-10-CM | POA: Diagnosis not present

## 2022-04-15 DIAGNOSIS — G309 Alzheimer's disease, unspecified: Secondary | ICD-10-CM | POA: Diagnosis not present

## 2022-04-15 DIAGNOSIS — F0280 Dementia in other diseases classified elsewhere without behavioral disturbance: Secondary | ICD-10-CM | POA: Diagnosis not present

## 2022-04-15 DIAGNOSIS — I509 Heart failure, unspecified: Secondary | ICD-10-CM | POA: Diagnosis not present

## 2022-04-18 DIAGNOSIS — I10 Essential (primary) hypertension: Secondary | ICD-10-CM | POA: Diagnosis not present

## 2022-04-18 DIAGNOSIS — M6281 Muscle weakness (generalized): Secondary | ICD-10-CM | POA: Diagnosis not present

## 2022-04-18 DIAGNOSIS — F0280 Dementia in other diseases classified elsewhere without behavioral disturbance: Secondary | ICD-10-CM | POA: Diagnosis not present

## 2022-04-18 DIAGNOSIS — I509 Heart failure, unspecified: Secondary | ICD-10-CM | POA: Diagnosis not present

## 2022-04-18 DIAGNOSIS — G309 Alzheimer's disease, unspecified: Secondary | ICD-10-CM | POA: Diagnosis not present

## 2022-04-18 DIAGNOSIS — J9601 Acute respiratory failure with hypoxia: Secondary | ICD-10-CM | POA: Diagnosis not present

## 2022-04-20 DIAGNOSIS — I10 Essential (primary) hypertension: Secondary | ICD-10-CM | POA: Diagnosis not present

## 2022-04-20 DIAGNOSIS — R2681 Unsteadiness on feet: Secondary | ICD-10-CM | POA: Diagnosis not present

## 2022-04-20 DIAGNOSIS — R2689 Other abnormalities of gait and mobility: Secondary | ICD-10-CM | POA: Diagnosis not present

## 2022-04-20 DIAGNOSIS — G309 Alzheimer's disease, unspecified: Secondary | ICD-10-CM | POA: Diagnosis not present

## 2022-04-20 DIAGNOSIS — J9601 Acute respiratory failure with hypoxia: Secondary | ICD-10-CM | POA: Diagnosis not present

## 2022-04-20 DIAGNOSIS — R4189 Other symptoms and signs involving cognitive functions and awareness: Secondary | ICD-10-CM | POA: Diagnosis not present

## 2022-04-20 DIAGNOSIS — F0280 Dementia in other diseases classified elsewhere without behavioral disturbance: Secondary | ICD-10-CM | POA: Diagnosis not present

## 2022-04-20 DIAGNOSIS — Z741 Need for assistance with personal care: Secondary | ICD-10-CM | POA: Diagnosis not present

## 2022-04-20 DIAGNOSIS — M6281 Muscle weakness (generalized): Secondary | ICD-10-CM | POA: Diagnosis not present

## 2022-04-20 DIAGNOSIS — I509 Heart failure, unspecified: Secondary | ICD-10-CM | POA: Diagnosis not present

## 2022-04-21 DIAGNOSIS — M6281 Muscle weakness (generalized): Secondary | ICD-10-CM | POA: Diagnosis not present

## 2022-04-21 DIAGNOSIS — I10 Essential (primary) hypertension: Secondary | ICD-10-CM | POA: Diagnosis not present

## 2022-04-21 DIAGNOSIS — F0280 Dementia in other diseases classified elsewhere without behavioral disturbance: Secondary | ICD-10-CM | POA: Diagnosis not present

## 2022-04-21 DIAGNOSIS — G309 Alzheimer's disease, unspecified: Secondary | ICD-10-CM | POA: Diagnosis not present

## 2022-04-21 DIAGNOSIS — I509 Heart failure, unspecified: Secondary | ICD-10-CM | POA: Diagnosis not present

## 2022-04-21 DIAGNOSIS — J9601 Acute respiratory failure with hypoxia: Secondary | ICD-10-CM | POA: Diagnosis not present

## 2022-04-22 DIAGNOSIS — G309 Alzheimer's disease, unspecified: Secondary | ICD-10-CM | POA: Diagnosis not present

## 2022-04-22 DIAGNOSIS — I10 Essential (primary) hypertension: Secondary | ICD-10-CM | POA: Diagnosis not present

## 2022-04-22 DIAGNOSIS — I509 Heart failure, unspecified: Secondary | ICD-10-CM | POA: Diagnosis not present

## 2022-04-22 DIAGNOSIS — M6281 Muscle weakness (generalized): Secondary | ICD-10-CM | POA: Diagnosis not present

## 2022-04-22 DIAGNOSIS — J9601 Acute respiratory failure with hypoxia: Secondary | ICD-10-CM | POA: Diagnosis not present

## 2022-04-22 DIAGNOSIS — F0280 Dementia in other diseases classified elsewhere without behavioral disturbance: Secondary | ICD-10-CM | POA: Diagnosis not present

## 2022-04-25 DIAGNOSIS — M6281 Muscle weakness (generalized): Secondary | ICD-10-CM | POA: Diagnosis not present

## 2022-04-25 DIAGNOSIS — F0280 Dementia in other diseases classified elsewhere without behavioral disturbance: Secondary | ICD-10-CM | POA: Diagnosis not present

## 2022-04-25 DIAGNOSIS — G309 Alzheimer's disease, unspecified: Secondary | ICD-10-CM | POA: Diagnosis not present

## 2022-04-25 DIAGNOSIS — J9601 Acute respiratory failure with hypoxia: Secondary | ICD-10-CM | POA: Diagnosis not present

## 2022-04-25 DIAGNOSIS — I10 Essential (primary) hypertension: Secondary | ICD-10-CM | POA: Diagnosis not present

## 2022-04-25 DIAGNOSIS — I509 Heart failure, unspecified: Secondary | ICD-10-CM | POA: Diagnosis not present

## 2022-04-26 DIAGNOSIS — J9601 Acute respiratory failure with hypoxia: Secondary | ICD-10-CM | POA: Diagnosis not present

## 2022-04-26 DIAGNOSIS — I10 Essential (primary) hypertension: Secondary | ICD-10-CM | POA: Diagnosis not present

## 2022-04-26 DIAGNOSIS — F0280 Dementia in other diseases classified elsewhere without behavioral disturbance: Secondary | ICD-10-CM | POA: Diagnosis not present

## 2022-04-26 DIAGNOSIS — I509 Heart failure, unspecified: Secondary | ICD-10-CM | POA: Diagnosis not present

## 2022-04-26 DIAGNOSIS — G309 Alzheimer's disease, unspecified: Secondary | ICD-10-CM | POA: Diagnosis not present

## 2022-04-26 DIAGNOSIS — M6281 Muscle weakness (generalized): Secondary | ICD-10-CM | POA: Diagnosis not present

## 2022-04-28 ENCOUNTER — Encounter: Payer: Self-pay | Admitting: Cardiology

## 2022-04-28 ENCOUNTER — Ambulatory Visit: Payer: Medicare Other | Attending: Cardiology | Admitting: Cardiology

## 2022-04-28 VITALS — BP 134/68 | HR 73 | Ht 68.0 in | Wt 245.2 lb

## 2022-04-28 DIAGNOSIS — E782 Mixed hyperlipidemia: Secondary | ICD-10-CM | POA: Diagnosis not present

## 2022-04-28 DIAGNOSIS — I2782 Chronic pulmonary embolism: Secondary | ICD-10-CM | POA: Diagnosis not present

## 2022-04-28 DIAGNOSIS — J9601 Acute respiratory failure with hypoxia: Secondary | ICD-10-CM | POA: Diagnosis not present

## 2022-04-28 DIAGNOSIS — I509 Heart failure, unspecified: Secondary | ICD-10-CM | POA: Diagnosis not present

## 2022-04-28 DIAGNOSIS — M6281 Muscle weakness (generalized): Secondary | ICD-10-CM | POA: Diagnosis not present

## 2022-04-28 DIAGNOSIS — I499 Cardiac arrhythmia, unspecified: Secondary | ICD-10-CM

## 2022-04-28 DIAGNOSIS — F0280 Dementia in other diseases classified elsewhere without behavioral disturbance: Secondary | ICD-10-CM | POA: Diagnosis not present

## 2022-04-28 DIAGNOSIS — I10 Essential (primary) hypertension: Secondary | ICD-10-CM | POA: Diagnosis not present

## 2022-04-28 DIAGNOSIS — G309 Alzheimer's disease, unspecified: Secondary | ICD-10-CM | POA: Diagnosis not present

## 2022-04-28 NOTE — Progress Notes (Signed)
Cardiology Office Note:    Date:  04/28/2022   ID:  Adrienne Mocha, DOB 1927/06/19, MRN [id number]  PCP:  Dewayne Shorter, [place] Providers Cardiologist:  None     Referring MD: Dewayne Shorter, MD   Chief Complaint  Patient presents with   New Patient (Initial Visit)    Irregular Cardiac Rhythm, PCP Ref     History of Present Illness:    Christopher Jenkins is a 88 y.o. male with a hx of PE s/p thrombectomy 04/2021 on Eliquis, hyperlipidemia, Alzheimer's dementia being seen for irregular heartbeat.  Patient saw her primary care provider for an office visit 01/27/2022.  Heart rhythm noted to be irregular on exam.  Prior EKG 04/20/2021 showed sinus rhythm with frequent PVCs in a bigeminal pattern.  Echocardiogram 03/2022 showed normal EF 50 to 55%  He feels well otherwise, denies palpitations.  Lives in an assisted living facility.  Past Medical History:  Diagnosis Date   Acute respiratory failure with hypoxia ([place])    Per [facility]   BPH (benign prostatic hyperplasia)    Duodenal mass    Elevated PSA    Fatty (change of) liver, not elsewhere classified    Per [facility]   Gross hematuria    Heart disease, unspecified    Per [facility]   Hyperlipidemia    Hypertension    Hypertrophy (benign) of prostate    asymptomatic on finasteride. will f/up with urologist Christopher Jenkins   Kidney lesion 07/22/2015   pt has appt at kidney specialist 08/05/15 to review CT scan.   Late onset Alzheimer disease ([place])    Per [facility]   Obesity    Overweight    Peripheral vascular disease, unspecified ([place])    Per [facility]   Tubular adenoma of colon    Urinary retention     Past Surgical History:  Procedure Laterality Date   APPENDECTOMY     cataracts     PULMONARY THROMBECTOMY Bilateral 04/21/2021   Procedure: PULMONARY THROMBECTOMY;  Surgeon: [name], MD;  Location: [place] CV [facility];  Service: Cardiovascular;  Laterality: Bilateral;   right  knee surgery Right 12-17/2017   TOTAL HIP ARTHROPLASTY Right 08/13/2015   Procedure: TOTAL HIP ARTHROPLASTY;  Surgeon: [name], MD;  Location: [facility] ORS;  Service: Orthopedics;  Laterality: Right;   Uvula Surgery  1990    Current Medications: Current Meds  Medication Sig   apixaban (ELIQUIS) 5 MG TABS tablet TAKE ONE TABLET BY MOUTH TWICE DAILY   docusate sodium (COLACE) 100 MG capsule Take 2 capsules (200 mg total) by mouth at bedtime.   donepezil (ARICEPT) 10 MG tablet Take 5 mg by mouth at bedtime.   finasteride (PROSCAR) 5 MG tablet TAKE 1 TABLET BY MOUTH DAILY   furosemide (LASIX) 40 MG tablet Take 40 mg by mouth daily.   Multiple Vitamins-Minerals (MULTIVITAMIN WITH MINERALS) tablet Take 1 tablet by mouth daily. At 1400   potassium chloride SA (KLOR-CON M) 20 MEQ tablet TAKE 1 TABLET BY MOUTH DAILY   tamsulosin (FLOMAX) 0.4 MG CAPS capsule TAKE 1 CAPSULE BY MOUTH ONCE DAILY     Allergies:   No known allergies   Social History   Socioeconomic History   Marital status: Married    Spouse name: Not on file   Number of children: 3   Years of education: Not on file   Highest education level: Not on file  Occupational History   Occupation:  Retired  Tobacco Use   Smoking status: Never   Smokeless tobacco: Never  Vaping Use   Vaping Use: Not on file  Substance and Sexual Activity   Alcohol use: No    Comment: Occasional glass of wine 3-4 times a year.   Drug use: No   Sexual activity: Never  Other Topics Concern   Not on file  Social History Narrative   2 sons, 1 daughter      Living will?   Sons/daughter would be medical decision makers   Has DNR   No tube feeds if cognitively unaware   Social Determinants of Health   Financial Resource Strain: Low Risk  (02/23/2018)   Overall Financial Resource Strain (CARDIA)    Difficulty of Paying Living Expenses: Not hard at all  Food Insecurity: No Food Insecurity (02/23/2018)   Hunger Vital Sign    Worried About  Running Out of Food in the Last Year: Never true    Ran Out of Food in the Last Year: Never true  Transportation Needs: No Transportation Needs (02/23/2018)   PRAPARE - Hydrologist (Medical): No    Lack of Transportation (Non-Medical): No  Physical Activity: Insufficiently Active (02/23/2018)   Exercise Vital Sign    Days of Exercise per Week: 5 days    Minutes of Exercise per Session: 20 min  Stress: No Stress Concern Present (02/23/2018)   [place]    Feeling of Stress : Not at all  Social Connections: Not on file     Family History: The patient's family history includes Heart disease in his father and mother; Stroke in his mother. There is no history of Kidney disease, Prostate cancer, Kidney cancer, or Bladder Cancer.  ROS:   Please see the history of present illness.     All other systems reviewed and are negative.  EKGs/Labs/Other Studies Reviewed:    The following studies were reviewed today:   EKG:  EKG is  ordered today.  The ekg ordered today demonstrates sinus rhythm, PACs  Recent Labs: 03/28/2022: ALT 16; B Natriuretic Peptide 165.6; BUN 23; Creatinine, Ser 1.19; Hemoglobin 15.5; Platelets 219; Potassium 4.4; Sodium 140  Recent Lipid Panel    Component Value Date/Time   CHOL 109 04/17/2020 1617   TRIG 160 (H) 04/17/2020 1617   HDL 38 (L) 04/17/2020 1617   CHOLHDL 2.9 04/17/2020 1617   VLDL 48.6 (H) 04/12/2019 1428   LDLCALC 47 04/17/2020 1617   LDLDIRECT 109.0 04/12/2019 1428     Risk Assessment/Calculations:             Physical Exam:    VS:  BP 134/68 (BP Location: Right Arm, Patient Position: Sitting, Cuff Size: Normal)   Pulse 73   Ht '5\' 8"'$  (1.727 m)   Wt 245 lb 3.2 oz (111.2 kg)   SpO2 (!) 89% Comment: 2 liters PM  BMI 37.28 kg/m     Wt Readings from Last 3 Encounters:  04/28/22 245 lb 3.2 oz (111.2 kg)  03/16/22 249 lb 3.2 oz (113 kg)  03/02/22 248  lb 6.4 oz (112.7 kg)     GEN:  Well nourished, well developed in no acute distress HEENT: Normal NECK: No JVD; No carotid bruits CARDIAC: RRR, no murmurs, rubs, gallops RESPIRATORY: Decreased breath sounds, otherwise clear ABDOMEN: Soft, non-tender, non-distended MUSCULOSKELETAL:  No edema; No deformity  SKIN: Warm and dry NEUROLOGIC:  Alert and oriented to person PSYCHIATRIC:  Normal affect   ASSESSMENT:    1. Irregular heart beat   2. Other chronic pulmonary embolism without acute cor pulmonale (HCC)   3. Mixed hyperlipidemia    PLAN:    In order of problems listed above:  Irregular heartbeat, previous EKG with PVCs, PACs noted on today's EKG.  Patient otherwise asymptomatic, recent echo shows normal EF.  No indication for additional testing or cardiac monitor. History of PE, on Eliquis. Hyperlipidemia, continue to monitor off statin.  Follow-up as needed      Medication Adjustments/Labs and Tests Ordered: Current medicines are reviewed at length with the patient today.  Concerns regarding medicines are outlined above.  Orders Placed This Encounter  Procedures   EKG 12-Lead   No orders of the defined types were placed in this encounter.   Patient Instructions  Medication Instructions:   Your physician recommends that you continue on your current medications as directed. Please refer to the Current Medication list given to you today.   *If you need a refill on your cardiac medications before your next appointment, please call your pharmacy*    Follow-Up: At [facility], you and your health needs are our priority.  As part of our continuing mission to provide you with exceptional heart care, we have created designated Provider Care Teams.  These Care Teams include your primary Cardiologist (physician) and Advanced Practice Providers (APPs -  Physician Assistants and Nurse Practitioners) who all work together to provide you with the care you need, when you  need it.  We recommend signing up for the patient portal called "MyChart".  Sign up information is provided on this After Visit Summary.  MyChart is used to connect with patients for Virtual Visits (Telemedicine).  Patients are able to view lab/test results, encounter notes, upcoming appointments, etc.  Non-urgent messages can be sent to your provider as well.   To learn more about what you can do with MyChart, go to [email or web address].    Your next appointment:   Follow up as needed   The format for your next appointment:   In Person  Provider:   You may see Kate Sable, MD or one of the following Advanced Practice Providers on your designated Care Team:   Murray Hodgkins, NP Christell Faith, PA-C Cadence Kathlen Mody, PA-C Gerrie Nordmann, NP    Other Instructions   Important Information About Sugar         Signed, Kate Sable, MD  04/28/2022 1:36 PM    [place]

## 2022-04-28 NOTE — Patient Instructions (Signed)
Medication Instructions:   Your physician recommends that you continue on your current medications as directed. Please refer to the Current Medication list given to you today.   *If you need a refill on your cardiac medications before your next appointment, please call your pharmacy*    Follow-Up: At [facility], you and your health needs are our priority.  As part of our continuing mission to provide you with exceptional heart care, we have created designated Provider Care Teams.  These Care Teams include your primary Cardiologist (physician) and Advanced Practice Providers (APPs -  Physician Assistants and Nurse Practitioners) who all work together to provide you with the care you need, when you need it.  We recommend signing up for the patient portal called "MyChart".  Sign up information is provided on this After Visit Summary.  MyChart is used to connect with patients for Virtual Visits (Telemedicine).  Patients are able to view lab/test results, encounter notes, upcoming appointments, etc.  Non-urgent messages can be sent to your provider as well.   To learn more about what you can do with MyChart, go to [email or web address].    Your next appointment:   Follow up as needed   The format for your next appointment:   In Person  Provider:   You may see Kate Sable, MD or one of the following Advanced Practice Providers on your designated Care Team:   Murray Hodgkins, NP Christell Faith, PA-C Cadence Kathlen Mody, PA-C Gerrie Nordmann, NP    Other Instructions   Important Information About Sugar

## 2022-05-02 DIAGNOSIS — G309 Alzheimer's disease, unspecified: Secondary | ICD-10-CM | POA: Diagnosis not present

## 2022-05-02 DIAGNOSIS — I10 Essential (primary) hypertension: Secondary | ICD-10-CM | POA: Diagnosis not present

## 2022-05-02 DIAGNOSIS — F0280 Dementia in other diseases classified elsewhere without behavioral disturbance: Secondary | ICD-10-CM | POA: Diagnosis not present

## 2022-05-02 DIAGNOSIS — M6281 Muscle weakness (generalized): Secondary | ICD-10-CM | POA: Diagnosis not present

## 2022-05-02 DIAGNOSIS — I509 Heart failure, unspecified: Secondary | ICD-10-CM | POA: Diagnosis not present

## 2022-05-02 DIAGNOSIS — J9601 Acute respiratory failure with hypoxia: Secondary | ICD-10-CM | POA: Diagnosis not present

## 2022-05-05 ENCOUNTER — Non-Acute Institutional Stay: Payer: Medicare Other | Admitting: Nurse Practitioner

## 2022-05-05 ENCOUNTER — Encounter: Payer: Self-pay | Admitting: Nurse Practitioner

## 2022-05-05 DIAGNOSIS — N138 Other obstructive and reflux uropathy: Secondary | ICD-10-CM | POA: Diagnosis not present

## 2022-05-05 DIAGNOSIS — Z86711 Personal history of pulmonary embolism: Secondary | ICD-10-CM | POA: Diagnosis not present

## 2022-05-05 DIAGNOSIS — I5032 Chronic diastolic (congestive) heart failure: Secondary | ICD-10-CM

## 2022-05-05 DIAGNOSIS — I509 Heart failure, unspecified: Secondary | ICD-10-CM | POA: Diagnosis not present

## 2022-05-05 DIAGNOSIS — J9601 Acute respiratory failure with hypoxia: Secondary | ICD-10-CM | POA: Diagnosis not present

## 2022-05-05 DIAGNOSIS — K5909 Other constipation: Secondary | ICD-10-CM

## 2022-05-05 DIAGNOSIS — I700 Atherosclerosis of aorta: Secondary | ICD-10-CM

## 2022-05-05 DIAGNOSIS — I10 Essential (primary) hypertension: Secondary | ICD-10-CM

## 2022-05-05 DIAGNOSIS — M6281 Muscle weakness (generalized): Secondary | ICD-10-CM | POA: Diagnosis not present

## 2022-05-05 DIAGNOSIS — G4733 Obstructive sleep apnea (adult) (pediatric): Secondary | ICD-10-CM | POA: Diagnosis not present

## 2022-05-05 DIAGNOSIS — N401 Enlarged prostate with lower urinary tract symptoms: Secondary | ICD-10-CM | POA: Diagnosis not present

## 2022-05-05 DIAGNOSIS — G309 Alzheimer's disease, unspecified: Secondary | ICD-10-CM

## 2022-05-05 DIAGNOSIS — F0280 Dementia in other diseases classified elsewhere without behavioral disturbance: Secondary | ICD-10-CM

## 2022-05-05 NOTE — Progress Notes (Signed)
Location:   [place] Room Number: 110B Place of Service:  ALF [phone]) Provider:  Sherrie Mustache, NP  Dewayne Shorter, MD  Patient Care Team: Dewayne Shorter, MD as PCP - General (Family Medicine) Lauree Chandler, NP as Nurse Practitioner (Geriatric Medicine)  Extended Emergency Contact Information Primary Emergency Contact: Jamesetta So Address: [address]          [place], [place] [address] Johnnette Litter of [place] Phone: [phone] Relation: Spouse Secondary Emergency Contact: [place] Phone: [phone] Mobile Phone: [phone] Relation: Son  Code Status:  DNR Goals of care: Advanced Directive information    05/05/2022   12:04 PM  Advanced Directives  Does Patient Have a Medical Advance Directive? Yes  Type of Paramedic of [place];Living will;Out of facility DNR (pink MOST or yellow form)  Does patient want to make changes to medical advance directive? No - Patient declined  Pre-existing out of facility DNR order (yellow form or pink MOST form) Yellow form placed in chart (order not valid for inpatient use)     Chief Complaint  Patient presents with   Medical Management of Chronic Issues    Routine follow up    HPI:  Pt is a 88 y.o. male seen today for medical management of chronic diseases.  Pt with hx of hyperlipidemia, CKD,bph, obesity, CHF, chronic PE.  Recently moved from AL to memory care for increase in care. He feels well and has no acute concerns.  Followed by cardiology due to CHF, a fib,    Past Medical History:  Diagnosis Date   Acute respiratory failure with hypoxia ([place])    Per [facility]   BPH (benign prostatic hyperplasia)    Duodenal mass    Elevated PSA    Fatty (change of) liver, not elsewhere classified    Per [facility]   Gross hematuria    Heart disease, unspecified    Per [facility]   Hyperlipidemia    Hypertension    Hypertrophy (benign) of  prostate    asymptomatic on finasteride. will f/up with urologist [name]   Kidney lesion 07/22/2015   pt has appt at kidney specialist 08/05/15 to review CT scan.   Late onset Alzheimer disease ([place])    Per [facility]   Obesity    Overweight    Peripheral vascular disease, unspecified ([place])    Per [facility]   Tubular adenoma of colon    Urinary retention    Past Surgical History:  Procedure Laterality Date   APPENDECTOMY     cataracts     PULMONARY THROMBECTOMY Bilateral 04/21/2021   Procedure: PULMONARY THROMBECTOMY;  Surgeon: [name], MD;  Location: [place] CV LAB;  Service: Cardiovascular;  Laterality: Bilateral;   right knee surgery Right 12-17/2017   TOTAL HIP ARTHROPLASTY Right 08/13/2015   Procedure: TOTAL HIP ARTHROPLASTY;  Surgeon: [name], MD;  Location: [facility] ORS;  Service: Orthopedics;  Laterality: Right;   Uvula Surgery  1990    Allergies  Allergen Reactions   No Known Allergies     Allergies as of 05/05/2022       Reactions   No Known Allergies         Medication List        Accurate as of May 05, 2022 12:05 PM. If you have any questions, ask your nurse or doctor.          STOP taking these medications    donepezil  10 MG tablet Commonly known as: ARICEPT Stopped by: Lauree Chandler, NP       TAKE these medications    docusate sodium 100 MG capsule Commonly known as: Colace Take 2 capsules (200 mg total) by mouth at bedtime.   Eliquis 5 MG Tabs tablet Generic drug: apixaban TAKE ONE TABLET BY MOUTH TWICE DAILY   finasteride 5 MG tablet Commonly known as: PROSCAR TAKE 1 TABLET BY MOUTH DAILY   furosemide 40 MG tablet Commonly known as: LASIX Take 40 mg by mouth daily.   multivitamin with minerals tablet Take 1 tablet by mouth daily. At 1400   Nizoral 1 % Sham Generic drug: KETOCONAZOLE (TOPICAL) Apply topically. Apply to hair and scalp topically every day shift every Wed, Sat for treatment twice  weekly with bath   potassium chloride SA 20 MEQ tablet Commonly known as: KLOR-CON M TAKE 1 TABLET BY MOUTH DAILY   tamsulosin 0.4 MG Caps capsule Commonly known as: FLOMAX TAKE 1 CAPSULE BY MOUTH ONCE DAILY        Review of Systems  Constitutional:  Negative for activity change, appetite change, fatigue and unexpected weight change.  HENT:  Negative for congestion and hearing loss.   Eyes: Negative.   Respiratory:  Negative for cough and shortness of breath.   Cardiovascular:  Negative for chest pain, palpitations and leg swelling.  Gastrointestinal:  Negative for abdominal pain, constipation and diarrhea.  Genitourinary:  Negative for difficulty urinating and dysuria.  Musculoskeletal:  Negative for arthralgias and myalgias.  Skin:  Negative for color change and wound.  Neurological:  Negative for dizziness and weakness.  Psychiatric/Behavioral:  Positive for confusion. Negative for agitation and behavioral problems.     Immunization History  Administered Date(s) Administered   Fluad Quad(high Dose 65+) 03/06/2019, 04/17/2020   Hep A / Hep B 03/17/2016   Influenza Split 04/03/2012, 03/12/2013   Influenza,inj,Quad PF,6+ Mos 02/24/2015   Influenza-Unspecified 03/29/2014, 03/08/2017, 03/09/2018   Pneumococcal Conjugate-13 05/07/2014   Pneumococcal Polysaccharide-23 03/26/2013, 04/12/2019   Tdap 07/29/2009, 05/31/2016   Zoster, Live 04/26/2013   Pertinent  Health Maintenance Due  Topic Date Due   INFLUENZA VACCINE  01/18/2022      04/21/2021    2:15 PM 04/21/2021    8:00 PM 04/22/2021    7:35 AM 04/22/2021   10:00 PM 04/23/2021    7:45 AM  Fall Risk  Patient Fall Risk Level High fall risk High fall risk High fall risk High fall risk High fall risk   Functional Status Survey:    Vitals:   05/05/22 1147  BP: (!) 123/56  Pulse: (!) 56  Resp: (!) 23  Temp: 97.8 F (36.6 C)  SpO2: 90%  Weight: 247 lb 3.2 oz (112.1 kg)  Height: '5\' 8"'$  (1.727 m)   Body mass index  is 37.59 kg/m. Physical Exam Constitutional:      General: He is not in acute distress.    Appearance: He is well-developed. He is not diaphoretic.  HENT:     Head: Normocephalic and atraumatic.     Right Ear: External ear normal.     Left Ear: External ear normal.     Mouth/Throat:     Pharynx: No oropharyngeal exudate.  Eyes:     Conjunctiva/sclera: Conjunctivae normal.     Pupils: Pupils are equal, round, and reactive to light.  Cardiovascular:     Rate and Rhythm: Normal rate and regular rhythm.     Heart sounds: Normal heart sounds.  Pulmonary:     Effort: Pulmonary effort is normal.     Breath sounds: Normal breath sounds.  Abdominal:     General: Bowel sounds are normal.     Palpations: Abdomen is soft.  Musculoskeletal:        General: No tenderness.     Cervical back: Normal range of motion and neck supple.     Right lower leg: No edema.     Left lower leg: No edema.  Skin:    General: Skin is warm and dry.  Neurological:     Mental Status: He is alert and oriented to person, place, and time.     Labs reviewed: Recent Labs    03/28/22 1048  NA 140  K 4.4  CL 99  CO2 30  GLUCOSE 98  BUN 23  CREATININE 1.19  CALCIUM 8.7*   Recent Labs    03/28/22 1048  AST 24  ALT 16  ALKPHOS 93  BILITOT 0.8  PROT 6.6  ALBUMIN 3.5   Recent Labs    03/28/22 1048  WBC 8.2  NEUTROABS 4.8  HGB 15.5  HCT 48.3  MCV 96.4  PLT 219   Lab Results  Component Value Date   TSH 1.98 04/05/2017   Lab Results  Component Value Date   HGBA1C 5.5 04/12/2019   Lab Results  Component Value Date   CHOL 109 04/17/2020   HDL 38 (L) 04/17/2020   LDLCALC 47 04/17/2020   LDLDIRECT 109.0 04/12/2019   TRIG 160 (H) 04/17/2020   CHOLHDL 2.9 04/17/2020    Significant Diagnostic Results in last 30 days:  No results found.  Assessment/Plan 1. Alzheimer's dementia without behavioral disturbance ([place]) -Stable, no acute changes in cognitive or functional status, continue  supportive care.   2. OSA (obstructive sleep apnea) -continues on CPAP  3. Primary hypertension -Blood pressure well controlled, goal bp <140/90 Continue current medications and dietary modifications follow metabolic panel  4. BPH with obstruction/lower urinary tract symptoms -stable, continues on proscar  5. Morbid obesity ([place]) -noted, discussed healthy weight loss through increase in physical activity and proper nutrition, likely will have weight loss with progression of memory loss  6. CHF (congestive heart failure), NYHA class I, chronic, diastolic (HCC) -stable, euvolemic, continues on lasix and potassium supplement  7. Aortic atherosclerosis ([place]) -noted on imaging, on eliquis due to hx of PE  8. History of pulmonary embolus (PE) -continues on eliquis, no worsening shortness of breath. No signs of abnormal bruising or bleeding.   9. Other constipation -stable on colace  Keydi Giel K. [place], [facility] [phone]

## 2022-05-10 DIAGNOSIS — F0280 Dementia in other diseases classified elsewhere without behavioral disturbance: Secondary | ICD-10-CM | POA: Diagnosis not present

## 2022-05-10 DIAGNOSIS — M6281 Muscle weakness (generalized): Secondary | ICD-10-CM | POA: Diagnosis not present

## 2022-05-10 DIAGNOSIS — G309 Alzheimer's disease, unspecified: Secondary | ICD-10-CM | POA: Diagnosis not present

## 2022-05-10 DIAGNOSIS — I10 Essential (primary) hypertension: Secondary | ICD-10-CM | POA: Diagnosis not present

## 2022-05-10 DIAGNOSIS — I509 Heart failure, unspecified: Secondary | ICD-10-CM | POA: Diagnosis not present

## 2022-05-10 DIAGNOSIS — J9601 Acute respiratory failure with hypoxia: Secondary | ICD-10-CM | POA: Diagnosis not present

## 2022-05-11 DIAGNOSIS — J9601 Acute respiratory failure with hypoxia: Secondary | ICD-10-CM | POA: Diagnosis not present

## 2022-05-11 DIAGNOSIS — I509 Heart failure, unspecified: Secondary | ICD-10-CM | POA: Diagnosis not present

## 2022-05-11 DIAGNOSIS — F0280 Dementia in other diseases classified elsewhere without behavioral disturbance: Secondary | ICD-10-CM | POA: Diagnosis not present

## 2022-05-11 DIAGNOSIS — G309 Alzheimer's disease, unspecified: Secondary | ICD-10-CM | POA: Diagnosis not present

## 2022-05-11 DIAGNOSIS — I10 Essential (primary) hypertension: Secondary | ICD-10-CM | POA: Diagnosis not present

## 2022-05-11 DIAGNOSIS — M6281 Muscle weakness (generalized): Secondary | ICD-10-CM | POA: Diagnosis not present

## 2022-05-13 DIAGNOSIS — G309 Alzheimer's disease, unspecified: Secondary | ICD-10-CM | POA: Diagnosis not present

## 2022-05-13 DIAGNOSIS — J9601 Acute respiratory failure with hypoxia: Secondary | ICD-10-CM | POA: Diagnosis not present

## 2022-05-13 DIAGNOSIS — F0280 Dementia in other diseases classified elsewhere without behavioral disturbance: Secondary | ICD-10-CM | POA: Diagnosis not present

## 2022-05-13 DIAGNOSIS — I10 Essential (primary) hypertension: Secondary | ICD-10-CM | POA: Diagnosis not present

## 2022-05-13 DIAGNOSIS — M6281 Muscle weakness (generalized): Secondary | ICD-10-CM | POA: Diagnosis not present

## 2022-05-13 DIAGNOSIS — I509 Heart failure, unspecified: Secondary | ICD-10-CM | POA: Diagnosis not present

## 2022-05-17 DIAGNOSIS — J9601 Acute respiratory failure with hypoxia: Secondary | ICD-10-CM | POA: Diagnosis not present

## 2022-05-17 DIAGNOSIS — I509 Heart failure, unspecified: Secondary | ICD-10-CM | POA: Diagnosis not present

## 2022-05-17 DIAGNOSIS — F0280 Dementia in other diseases classified elsewhere without behavioral disturbance: Secondary | ICD-10-CM | POA: Diagnosis not present

## 2022-05-17 DIAGNOSIS — G309 Alzheimer's disease, unspecified: Secondary | ICD-10-CM | POA: Diagnosis not present

## 2022-05-17 DIAGNOSIS — M6281 Muscle weakness (generalized): Secondary | ICD-10-CM | POA: Diagnosis not present

## 2022-05-17 DIAGNOSIS — I10 Essential (primary) hypertension: Secondary | ICD-10-CM | POA: Diagnosis not present

## 2022-05-19 DIAGNOSIS — F0280 Dementia in other diseases classified elsewhere without behavioral disturbance: Secondary | ICD-10-CM | POA: Diagnosis not present

## 2022-05-19 DIAGNOSIS — I509 Heart failure, unspecified: Secondary | ICD-10-CM | POA: Diagnosis not present

## 2022-05-19 DIAGNOSIS — I10 Essential (primary) hypertension: Secondary | ICD-10-CM | POA: Diagnosis not present

## 2022-05-19 DIAGNOSIS — G309 Alzheimer's disease, unspecified: Secondary | ICD-10-CM | POA: Diagnosis not present

## 2022-05-19 DIAGNOSIS — M6281 Muscle weakness (generalized): Secondary | ICD-10-CM | POA: Diagnosis not present

## 2022-05-19 DIAGNOSIS — J9601 Acute respiratory failure with hypoxia: Secondary | ICD-10-CM | POA: Diagnosis not present

## 2022-05-20 DIAGNOSIS — R4189 Other symptoms and signs involving cognitive functions and awareness: Secondary | ICD-10-CM | POA: Diagnosis not present

## 2022-05-20 DIAGNOSIS — R2681 Unsteadiness on feet: Secondary | ICD-10-CM | POA: Diagnosis not present

## 2022-05-20 DIAGNOSIS — R2689 Other abnormalities of gait and mobility: Secondary | ICD-10-CM | POA: Diagnosis not present

## 2022-05-20 DIAGNOSIS — M6281 Muscle weakness (generalized): Secondary | ICD-10-CM | POA: Diagnosis not present

## 2022-05-20 DIAGNOSIS — G309 Alzheimer's disease, unspecified: Secondary | ICD-10-CM | POA: Diagnosis not present

## 2022-05-20 DIAGNOSIS — F0280 Dementia in other diseases classified elsewhere without behavioral disturbance: Secondary | ICD-10-CM | POA: Diagnosis not present

## 2022-05-20 DIAGNOSIS — I509 Heart failure, unspecified: Secondary | ICD-10-CM | POA: Diagnosis not present

## 2022-05-20 DIAGNOSIS — J9601 Acute respiratory failure with hypoxia: Secondary | ICD-10-CM | POA: Diagnosis not present

## 2022-05-20 DIAGNOSIS — Z741 Need for assistance with personal care: Secondary | ICD-10-CM | POA: Diagnosis not present

## 2022-05-20 DIAGNOSIS — I10 Essential (primary) hypertension: Secondary | ICD-10-CM | POA: Diagnosis not present

## 2022-05-24 DIAGNOSIS — F0280 Dementia in other diseases classified elsewhere without behavioral disturbance: Secondary | ICD-10-CM | POA: Diagnosis not present

## 2022-05-24 DIAGNOSIS — I509 Heart failure, unspecified: Secondary | ICD-10-CM | POA: Diagnosis not present

## 2022-05-24 DIAGNOSIS — J9601 Acute respiratory failure with hypoxia: Secondary | ICD-10-CM | POA: Diagnosis not present

## 2022-05-24 DIAGNOSIS — G309 Alzheimer's disease, unspecified: Secondary | ICD-10-CM | POA: Diagnosis not present

## 2022-05-24 DIAGNOSIS — I10 Essential (primary) hypertension: Secondary | ICD-10-CM | POA: Diagnosis not present

## 2022-05-24 DIAGNOSIS — M6281 Muscle weakness (generalized): Secondary | ICD-10-CM | POA: Diagnosis not present

## 2022-05-26 DIAGNOSIS — G309 Alzheimer's disease, unspecified: Secondary | ICD-10-CM | POA: Diagnosis not present

## 2022-05-26 DIAGNOSIS — J9601 Acute respiratory failure with hypoxia: Secondary | ICD-10-CM | POA: Diagnosis not present

## 2022-05-26 DIAGNOSIS — I10 Essential (primary) hypertension: Secondary | ICD-10-CM | POA: Diagnosis not present

## 2022-05-26 DIAGNOSIS — M6281 Muscle weakness (generalized): Secondary | ICD-10-CM | POA: Diagnosis not present

## 2022-05-26 DIAGNOSIS — F0280 Dementia in other diseases classified elsewhere without behavioral disturbance: Secondary | ICD-10-CM | POA: Diagnosis not present

## 2022-05-26 DIAGNOSIS — I509 Heart failure, unspecified: Secondary | ICD-10-CM | POA: Diagnosis not present

## 2022-07-23 ENCOUNTER — Emergency Department
Admission: EM | Admit: 2022-07-23 | Discharge: 2022-07-24 | Disposition: A | Discharge status: Home / Self Care | Payer: Medicare Other | Attending: Emergency Medicine | Admitting: Emergency Medicine

## 2022-07-23 ENCOUNTER — Emergency Department: Payer: Medicare Other

## 2022-07-23 ENCOUNTER — Other Ambulatory Visit: Payer: Self-pay

## 2022-07-23 DIAGNOSIS — R140 Abdominal distension (gaseous): Secondary | ICD-10-CM | POA: Diagnosis present

## 2022-07-23 DIAGNOSIS — N3001 Acute cystitis with hematuria: Secondary | ICD-10-CM | POA: Diagnosis not present

## 2022-07-23 DIAGNOSIS — R109 Unspecified abdominal pain: Secondary | ICD-10-CM | POA: Diagnosis not present

## 2022-07-23 DIAGNOSIS — Z7901 Long term (current) use of anticoagulants: Secondary | ICD-10-CM | POA: Diagnosis not present

## 2022-07-23 DIAGNOSIS — F0390 Unspecified dementia without behavioral disturbance: Secondary | ICD-10-CM | POA: Insufficient documentation

## 2022-07-23 DIAGNOSIS — R197 Diarrhea, unspecified: Secondary | ICD-10-CM | POA: Diagnosis not present

## 2022-07-23 DIAGNOSIS — I4891 Unspecified atrial fibrillation: Secondary | ICD-10-CM | POA: Diagnosis not present

## 2022-07-23 DIAGNOSIS — I700 Atherosclerosis of aorta: Secondary | ICD-10-CM | POA: Diagnosis not present

## 2022-07-23 DIAGNOSIS — I959 Hypotension, unspecified: Secondary | ICD-10-CM | POA: Diagnosis not present

## 2022-07-23 DIAGNOSIS — I10 Essential (primary) hypertension: Secondary | ICD-10-CM | POA: Diagnosis not present

## 2022-07-23 DIAGNOSIS — R0902 Hypoxemia: Secondary | ICD-10-CM | POA: Diagnosis not present

## 2022-07-23 DIAGNOSIS — R9431 Abnormal electrocardiogram [ECG] [EKG]: Secondary | ICD-10-CM | POA: Diagnosis not present

## 2022-07-23 DIAGNOSIS — R1084 Generalized abdominal pain: Secondary | ICD-10-CM | POA: Diagnosis not present

## 2022-07-23 LAB — COMPREHENSIVE METABOLIC PANEL
ALT: 14 U/L (ref 0–44)
AST: 27 U/L (ref 15–41)
Albumin: 3 g/dL — ABNORMAL LOW (ref 3.5–5.0)
Alkaline Phosphatase: 98 U/L (ref 38–126)
Anion gap: 13 (ref 5–15)
BUN: 20 mg/dL (ref 8–23)
CO2: 22 mmol/L (ref 22–32)
Calcium: 8 mg/dL — ABNORMAL LOW (ref 8.9–10.3)
Chloride: 99 mmol/L (ref 98–111)
Creatinine, Ser: 1 mg/dL (ref 0.61–1.24)
GFR, Estimated: >60 mL/min (ref >60)
Glucose, Bld: 140 mg/dL — ABNORMAL HIGH (ref 70–99)
Potassium: 3.7 mmol/L (ref 3.5–5.1)
Sodium: 134 mmol/L — ABNORMAL LOW (ref 135–145)
Total Bilirubin: 1 mg/dL (ref 0.3–1.2)
Total Protein: 5.9 g/dL — ABNORMAL LOW (ref 6.5–8.1)

## 2022-07-23 LAB — URINALYSIS, ROUTINE W REFLEX MICROSCOPIC
Bacteria, UA: NONE SEEN
Bilirubin Urine: NEGATIVE
Glucose, UA: NEGATIVE mg/dL
Ketones, ur: NEGATIVE mg/dL
Nitrite: POSITIVE — AB
Protein, ur: 30 mg/dL — AB
RBC / HPF: >50 RBC/hpf (ref 0–5)
Specific Gravity, Urine: 1.043 — ABNORMAL HIGH (ref 1.005–1.030)
WBC, UA: >50 WBC/hpf (ref 0–5)
pH: 5 (ref 5.0–8.0)

## 2022-07-23 LAB — CBC
HCT: 46.2 % (ref 39.0–52.0)
Hemoglobin: 15.1 g/dL (ref 13.0–17.0)
MCH: 31.6 pg (ref 26.0–34.0)
MCHC: 32.7 g/dL (ref 30.0–36.0)
MCV: 96.7 fL (ref 80.0–100.0)
Platelets: 238 10*3/uL (ref 150–400)
RBC: 4.78 MIL/uL (ref 4.22–5.81)
RDW: 13 % (ref 11.5–15.5)
WBC: 12.4 10*3/uL — ABNORMAL HIGH (ref 4.0–10.5)
nRBC: 0 % (ref 0.0–0.2)

## 2022-07-23 LAB — LIPASE, BLOOD: Lipase: 24 U/L (ref 11–51)

## 2022-07-23 MED ORDER — SULFAMETHOXAZOLE-TRIMETHOPRIM 800-160 MG PO TABS
1.0000 | ORAL_TABLET | Freq: Once | ORAL | Status: AC
  Administered 2022-07-23: 1 via ORAL
  Filled 2022-07-23: qty 1

## 2022-07-23 MED ORDER — SULFAMETHOXAZOLE-TRIMETHOPRIM 800-160 MG PO TABS: 1.0000 | ORAL_TABLET | Freq: Two times a day (BID) | ORAL | 0 refills | Status: AC

## 2022-07-23 MED ORDER — IOHEXOL 300 MG/ML  SOLN
100.0000 mL | Freq: Once | INTRAMUSCULAR | Status: AC | PRN
  Administered 2022-07-23: 100 mL via INTRAVENOUS

## 2022-07-23 MED ORDER — CEPHALEXIN 500 MG PO CAPS: 500.0000 mg | ORAL_CAPSULE | Freq: Once | ORAL | Status: DC

## 2022-07-23 NOTE — ED Notes (Signed)
Report called to [facility], given to Mendel Corning, RN

## 2022-07-23 NOTE — ED Triage Notes (Addendum)
Pt comes from [facility] via [facility] c/o diarrhea throughout today. Pt abdomen distended. Per EMS, pt usually walk but has gotten weaker throughout the day. Not complaining of pain at this time. Hx of dementia. A&O X3- baseline per EMS  91% RA per ems, facility states this is normal. Only wears O2 at night 131/59 66 HR

## 2022-07-23 NOTE — ED Notes (Signed)
Pt placed on 2L O2 due to O2 in mid 80s. O2 currently in mid 90s

## 2022-07-23 NOTE — Discharge Instructions (Signed)
Antibiotics for the full course as prescribed.  Drink plenty of fluids to stay well-hydrated as diarrhea can make you dehydrated.  Thank you for choosing [place] for your health care today!  Please see your primary doctor this week for a follow up appointment.   Sometimes, in the early stages of certain disease courses it is difficult to detect in the emergency department evaluation -- so, it is important that you continue to monitor your symptoms and call your doctor right away or return to the emergency department if you develop any new or worsening symptoms.  Please go to the following website to schedule new (and existing) patient appointments:   [email or web address]  If you do not have a primary doctor try calling the following clinics to establish care:  If you have insurance:  [facility] [phone] [place] [address]   [facility]  [phone] [address] [place] [address]   If you do not have insurance:  [facility]  [phone] [address] [place] [address]   The following is another list of primary care offices in the area who are accepting new patients at this time.  Please reach out to one of them directly and let them know you would like to schedule an appointment to follow up on an Emergency Department visit, and/or to establish a new primary care provider (PCP).  There are likely other primary care clinics in the are who are accepting new patients, but this is an excellent place to start:  [place] physician: Dr Lavon Paganini [address] [place], [place] [address] [phone]  [facility] Lead Physician: Dr Steele Sizer [address], [place], [place] [address] [phone]  [place] Physician: Dr Park Liter [address] [place], [place] [address] [phone]  [facility] Lead Physician: Dr Dewaine Oats [place], [place], [place] [address] [phone]  [place] [facility] [place] Physician: Dr Halina Maidens [address] [place], [place] [address] [phone]   It was my pleasure to care for you today.   [place]ette Jacelyn Grip, MD

## 2022-07-23 NOTE — ED Provider Notes (Signed)
[facility] Provider Note    Event Date/Time   First MD Initiated Contact with Patient 07/23/22 1932     (approximate)   History   Diarrhea   HPI  Christopher Jenkins is a 89 y.o. male   Past medical history of Alzheimer's dementia, A-fib on Eliquis, from facility with abdominal pain/distention/diarrhea.  No fever no chills no other acute medical complaints.  This is a report given from facility as the patient is demented and has no acute medical complaints at this time  He looks comfortable and has some distention in his abdomen but is otherwise soft and nontender no rigidity or guarding.   External Medical Documents Reviewed: Internal medicine office visit dated August 2023 which documents his prior health history      Physical Exam   Triage Vital Signs: ED Triage Vitals  Enc Vitals Group     BP 07/23/22 1917 (!) 132/59     Pulse Rate 07/23/22 1917 66     Resp 07/23/22 1917 (!) 23     Temp 07/23/22 1917 98.5 F (36.9 C)     Temp Source 07/23/22 1917 Oral     SpO2 07/23/22 1917 92 %     Weight --      Height --      Head Circumference --      Peak Flow --      Pain Score 07/23/22 1915 0     Pain Loc --      Pain Edu? --      Excl. in [place]? --     Most recent vital signs: Vitals:   07/23/22 2130 07/23/22 2200  BP: (!) 120/58 110/61  Pulse: 63 (!) 54  Resp: (!) 25 (!) 24  Temp:    SpO2: 95% 94%    General: Awake, no distress.  CV:  Good peripheral perfusion.  Resp:  Normal effort.  Abd:  +distention.  No rigidity or guarding Other:  Awake alert comfortable disoriented  ED Results / Procedures / Treatments   Labs (all labs ordered are listed, but only abnormal results are displayed) Labs Reviewed  COMPREHENSIVE METABOLIC PANEL - Abnormal; Notable for the following components:      Result Value   Sodium 134 (*)    Glucose, Bld 140 (*)    Calcium 8.0 (*)    Total Protein 5.9 (*)    Albumin 3.0 (*)    All other components  within normal limits  CBC - Abnormal; Notable for the following components:   WBC 12.4 (*)    All other components within normal limits  URINALYSIS, ROUTINE W REFLEX MICROSCOPIC - Abnormal; Notable for the following components:   Color, Urine YELLOW (*)    APPearance HAZY (*)    Specific Gravity, Urine 1.043 (*)    Hgb urine dipstick LARGE (*)    Protein, ur 30 (*)    Nitrite POSITIVE (*)    Leukocytes,Ua SMALL (*)    All other components within normal limits  URINE CULTURE  LIPASE, BLOOD     I ordered and reviewed the above labs they are notable for nitrite positive urine, white blood cell count 12.4  EKG  ED ECG REPORT I, Lucillie Garfinkel, the attending physician, personally viewed and interpreted this ECG.   Date: 07/23/2022  EKG Time: 1914  Rate: 66  Rhythm: sinus  Axis: nl  Intervals: occasional pacs  ST&T Change: No acute ischemic changes    RADIOLOGY I independently reviewed and  interpreted CT scan of the abdomen I see no obvious obstructive or infectious urines, I do see kidney cysts   PROCEDURES:  Critical Care performed: No  Procedures   MEDICATIONS ORDERED IN ED: Medications  iohexol (OMNIPAQUE) 300 MG/ML solution 100 mL (100 mLs Intravenous Contrast Given 07/23/22 2015)  sulfamethoxazole-trimethoprim (BACTRIM DS) 800-160 MG per tablet 1 tablet (1 tablet Oral Given 07/23/22 2225)    IMPRESSION / MDM / ASSESSMENT AND PLAN / ED COURSE  I reviewed the triage vital signs and the nursing notes.                                Patient's presentation is most consistent with acute presentation with potential threat to life or bodily function.  Differential diagnosis includes, but is not limited to, abdominal infection, obstruction, infectious diarrhea, urinary tract infection   The patient is on the cardiac monitor to evaluate for evidence of arrhythmia and/or significant heart rate changes.  MDM: Well-appearing patient with reports of abdominal distention  and diarrhea, check basic labs, CAT scan of the abdomen pelvis, urinalysis.  CT scan shows no surgical pathologies patient remained stable and evidence of UTI nitrite positive but curiously no bacteria.  Sent for culture.  I chose to antibiosis him with Bactrim as this would give coverage for infectious diarrhea as well.  Since he is stable and no surgical pathology on CT scan, discharged back to facility  I considered hospitalization for admission or observation for serial abdominal exams but given negative CT scan and stability of patient with plan for outpatient antibiotics, discharge most appropriate at this time outpatient follow-up        FINAL CLINICAL IMPRESSION(S) / ED DIAGNOSES   Final diagnoses:  Diarrhea, unspecified type  Acute cystitis with hematuria     Rx / DC Orders   ED Discharge Orders          Ordered    sulfamethoxazole-trimethoprim (BACTRIM DS) 800-160 MG tablet  2 times daily        07/23/22 2148             Note:  This document was prepared using Dragon voice recognition software and may include unintentional dictation errors.    Lucillie Garfinkel, MD 07/23/22 [phone]

## 2022-07-24 DIAGNOSIS — Z7401 Bed confinement status: Secondary | ICD-10-CM | POA: Diagnosis not present

## 2022-07-24 DIAGNOSIS — I10 Essential (primary) hypertension: Secondary | ICD-10-CM | POA: Diagnosis not present

## 2022-07-25 ENCOUNTER — Encounter: Payer: Self-pay | Admitting: Student

## 2022-07-25 ENCOUNTER — Non-Acute Institutional Stay: Payer: Medicare Other | Admitting: Student

## 2022-07-25 DIAGNOSIS — I5032 Chronic diastolic (congestive) heart failure: Secondary | ICD-10-CM

## 2022-07-25 DIAGNOSIS — R197 Diarrhea, unspecified: Secondary | ICD-10-CM | POA: Diagnosis not present

## 2022-07-25 DIAGNOSIS — I10 Essential (primary) hypertension: Secondary | ICD-10-CM

## 2022-07-25 DIAGNOSIS — G309 Alzheimer's disease, unspecified: Secondary | ICD-10-CM

## 2022-07-25 DIAGNOSIS — F0280 Dementia in other diseases classified elsewhere without behavioral disturbance: Secondary | ICD-10-CM | POA: Diagnosis not present

## 2022-07-25 DIAGNOSIS — I700 Atherosclerosis of aorta: Secondary | ICD-10-CM

## 2022-07-25 LAB — URINE CULTURE

## 2022-07-25 NOTE — Progress Notes (Unsigned)
Location:  [facility].  Nursing Home Room Number: [facility] [address] Place of Service:  ALF [phone]) Provider:  Dewayne Shorter, MD  Patient Care Team: Dewayne Shorter, MD as PCP - General (Family Medicine) Lauree Chandler, NP as Nurse Practitioner (Geriatric Medicine)  Extended Emergency Contact Information Primary Emergency Contact: Jamesetta So Address: [address]          [place], [place] [address] [place] L[place] of [place] Phone: [phone] Relation: Spouse Secondary Emergency Contact: [place] Phone: [phone] Mobile Phone: [phone] Relation: Son  Code Status:  DNR Goals of care: Advanced Directive information    07/25/2022    8:57 AM  Advanced Directives  Does Patient Have a Medical Advance Directive? Yes  Type of Advance Directive Out of facility DNR (pink MOST or yellow form)  Does patient want to make changes to medical advance directive? No - Patient declined     Chief Complaint  Patient presents with   Acute Visit    ED Follow up    HPI:  Pt is a 89 y.o. male seen today for an acute visit for follow up after visit.  Patient with history of dementia and unable to focus on presenting history at this time.  She he is more concerned with his wife's wellbeing since she has had the diarrhea and vomiting overnight.  He does not recall having diarrhea, abdominal distention, abdominal pain.  Workup in the emergency department unremarkable patient was promptly returned to facility.  Unclear if he is continue to have diarrhea, however he agrees to stay in his room today given his wife and his illness at this time.   Past Medical History:  Diagnosis Date   Acute respiratory failure with hypoxia ([place])    Per [facility]   BPH (benign prostatic hyperplasia)    Duodenal mass    Elevated PSA    Fatty (change of) liver, not elsewhere classified    Per [facility]   Gross hematuria    Heart disease, unspecified    Per [facility]    Hyperlipidemia    Hypertension    Hypertrophy (benign) of prostate    asymptomatic on finasteride. will f/up with urologist [name]   Kidney lesion 07/22/2015   pt has appt at kidney specialist 08/05/15 to review CT scan.   Late onset Alzheimer disease ([place])    Per [facility]   Obesity    Overweight    Peripheral vascular disease, unspecified ([place])    Per [facility]   Tubular adenoma of colon    Urinary retention    Past Surgical History:  Procedure Laterality Date   APPENDECTOMY     cataracts     PULMONARY THROMBECTOMY Bilateral 04/21/2021   Procedure: PULMONARY THROMBECTOMY;  Surgeon: [name], MD;  Location: [facility];  Service: Cardiovascular;  Laterality: Bilateral;   right knee surgery Right 12-17/2017   TOTAL HIP ARTHROPLASTY Right 08/13/2015   Procedure: TOTAL HIP ARTHROPLASTY;  Surgeon: [name], MD;  Location: [facility] ORS;  Service: Orthopedics;  Laterality: Right;   Uvula Surgery  1990    Allergies  Allergen Reactions   No Known Allergies     Outpatient Encounter Medications as of 07/25/2022  Medication Sig   apixaban (ELIQUIS) 5 MG TABS tablet TAKE ONE TABLET BY MOUTH TWICE DAILY   bisacodyl (DULCOLAX) 10 MG suppository Place 10 mg rectally daily as needed for moderate constipation.   bismuth subsalicylate (PEPTO BISMOL) 262 MG/15ML suspension Take 30 mLs  by mouth every hour as needed. Up to 6 doses in 24 hours   docusate sodium (COLACE) 100 MG capsule Take 2 capsules (200 mg total) by mouth at bedtime.   finasteride (PROSCAR) 5 MG tablet TAKE 1 TABLET BY MOUTH DAILY   furosemide (LASIX) 40 MG tablet Take 40 mg by mouth daily.   KETOCONAZOLE, TOPICAL, (NIZORAL) 1 % SHAM Apply topically. Apply to hair and scalp topically every day shift every Wed, Sat for treatment twice weekly with bath   Multiple Vitamins-Minerals (MULTIVITAMIN WITH MINERALS) tablet Take 1 tablet by mouth daily. At 1400   OXYGEN 2 lpm as needed to keep sat>90%   potassium  chloride SA (KLOR-CON M) 20 MEQ tablet TAKE 1 TABLET BY MOUTH DAILY   sulfamethoxazole-trimethoprim (BACTRIM DS) 800-160 MG tablet Take 1 tablet by mouth 2 (two) times daily for 7 days.   tamsulosin (FLOMAX) 0.4 MG CAPS capsule TAKE 1 CAPSULE BY MOUTH ONCE DAILY   No facility-administered encounter medications on file as of 07/25/2022.    Review of Systems  Immunization History  Administered Date(s) Administered   Fluad Quad(high Dose 65+) 03/06/2019, 04/17/2020   Hep A / Hep B 03/17/2016   Hepatitis B, adult 03/17/2016   Influenza Split 04/03/2012, 03/12/2013   Influenza,inj,Quad PF,6+ Mos 02/24/2015   Influenza-Unspecified 03/29/2014, 03/08/2017, 03/09/2018, 04/06/2021, 04/06/2022   PPD Test 04/23/2021   Pneumococcal Conjugate-13 05/07/2014   Pneumococcal Polysaccharide-23 03/26/2013, 04/12/2019   Td,absorbed, Preservative Free, Adult Use, Lf Unspecified 07/29/2009   Tdap 07/29/2009, 05/31/2016   Zoster, Live 04/26/2013   Pertinent  Health Maintenance Due  Topic Date Due   INFLUENZA VACCINE  Completed      04/21/2021    2:15 PM 04/21/2021    8:00 PM 04/22/2021    7:35 AM 04/22/2021   10:00 PM 04/23/2021    7:45 AM  Fall Risk  (RETIRED) Patient Fall Risk Level High fall risk High fall risk High fall risk High fall risk High fall risk   Functional Status Survey:    Vitals:   07/25/22 0845  BP: 111/71  Pulse: 63  Resp: (!) 23  Temp: 98.2 F (36.8 C)  SpO2: 95%  Weight: 240 lb 8 oz (109.1 kg)  Height: '5\' 4"'$  (1.626 m)   Body mass index is 41.28 kg/m. Physical Exam Vitals reviewed.  Constitutional:      Appearance: Normal appearance.  Cardiovascular:     Rate and Rhythm: Normal rate.  Pulmonary:     Effort: Pulmonary effort is normal.  Abdominal:     General: Abdomen is flat. Bowel sounds are normal.     Palpations: Abdomen is soft.  Skin:    General: Skin is warm and dry.  Neurological:     Mental Status: He is alert. Mental status is at baseline.      Labs reviewed: Recent Labs    03/18/22 0000 03/28/22 1048 07/23/22 1917  NA 140 140 134*  K 4.2 4.4 3.7  CL 99 99 99  CO2 36* 30 22  GLUCOSE  --  98 140*  BUN '15 23 20  '$ CREATININE 1.0 1.19 1.00  CALCIUM 9.5 8.7* 8.0*   Recent Labs    03/18/22 0000 03/28/22 1048 07/23/22 1917  AST '16 24 27  '$ ALT '14 16 14  '$ ALKPHOS 105 93 98  BILITOT  --  0.8 1.0  PROT  --  6.6 5.9*  ALBUMIN 3.5 3.5 3.0*   Recent Labs    03/18/22 0000 03/28/22 1048 07/23/22 1917  WBC 8.9 8.2 12.4*  NEUTROABS 5,963.00 4.8  --   HGB 15.1 15.5 15.1  HCT 46 48.3 46.2  MCV  --  96.4 96.7  PLT 203 219 238   Lab Results  Component Value Date   TSH 1.98 04/05/2017   Lab Results  Component Value Date   HGBA1C 5.7 03/18/2022   Lab Results  Component Value Date   CHOL 109 04/17/2020   HDL 38 (L) 04/17/2020   LDLCALC 47 04/17/2020   LDLDIRECT 109.0 04/12/2019   TRIG 160 (H) 04/17/2020   CHOLHDL 2.9 04/17/2020    Significant Diagnostic Results in last 30 days:  CT Abdomen Pelvis W Contrast  Result Date: 07/23/2022 CLINICAL DATA:  Abdominal pain EXAM: CT ABDOMEN AND PELVIS WITH CONTRAST TECHNIQUE: Multidetector CT imaging of the abdomen and pelvis was performed using the standard protocol following bolus administration of intravenous contrast. RADIATION DOSE REDUCTION: This exam was performed according to the departmental dose-optimization program which includes automated exposure control, adjustment of the mA and/or kV according to patient size and/or use of iterative reconstruction technique. CONTRAST:  170m OMNIPAQUE IOHEXOL 300 MG/ML  SOLN COMPARISON:  None Available. FINDINGS: Lower chest: No acute abnormality. Hepatobiliary: No focal liver abnormality is seen. No gallstones, gallbladder wall thickening, or biliary dilatation. Pancreas: Unremarkable. No pancreatic ductal dilatation or surrounding inflammatory changes. Spleen: Normal in size without focal abnormality. Adrenals/Urinary Tract: The  adrenal glands are normal. There are large bilateral Bosniak class 1 cysts, measuring 7.2 cm on the right and 7.5 cm on the left. No follow-up imaging is required. Stomach/Bowel: The stomach and duodenum are normal. No dilated or inflamed small bowel. There is no focal colonic abnormality. No appendix visualized. Vascular/Lymphatic: Aortic atherosclerosis. No enlarged abdominal or pelvic lymph nodes. Reproductive: Enlarged prostate gland measures 7.3 cm in transverse dimension. Other: None Musculoskeletal: Right total hip arthroplasty. No acute abnormality. Multilevel facet arthrosis and degenerative disc disease. IMPRESSION: 1. No acute abnormality of the abdomen or pelvis. 2. Enlarged prostate gland. 3. Large bilateral Bosniak class 1 cysts, measuring 7.2 cm on the right and 7.5 cm on the left. No follow-up imaging is required. Aortic Atherosclerosis (ICD10-I70.0). Electronically Signed   By: KUlyses JarredM.D.   On: 07/23/2022 20:33    Assessment/Plan Alzheimer's dementia without behavioral disturbance (HCC)  Diarrhea of presumed infectious origin  CHF (congestive heart failure), NYHA class I, chronic, diastolic ([place]  Morbid obesity ([place]  Primary hypertension  Aortic atherosclerosis (HB and E Patient with episodes of diarrhea and abdominal distention overnight.  Negative workup in the emergency department.  Will continue patient's prescription for Bactrim due to concern for urinary tract infection.  Urine cultures show mixed urine culture and recommend recollection.  Given 48 hours of antibiotic therapy some concern that the urinalysis will be unremarkable.  Will plan for 5-day course of antibiotic which will presumably treat any diarrhea on this as well.  Continue Flomax.  Continue potassium supplementation.  Continue Lasix 40 mg.  Patient appears euvolemic on exam.  Continue Proscar 5 mg.  Continue Pepto-Bismol as needed.  Patient will need to have a decreased dose of apixaban given kidney function  and age.  Will plan to continue Eliquis at 2.5 mg twice daily.   Family/ staff Communication: nursing  Labs/tests ordered:  none

## 2022-08-25 ENCOUNTER — Encounter: Payer: Self-pay | Admitting: Pulmonary Disease

## 2022-09-13 DIAGNOSIS — I7091 Generalized atherosclerosis: Secondary | ICD-10-CM | POA: Diagnosis not present

## 2022-09-13 DIAGNOSIS — B351 Tinea unguium: Secondary | ICD-10-CM | POA: Diagnosis not present

## 2022-09-15 DIAGNOSIS — Z79899 Other long term (current) drug therapy: Secondary | ICD-10-CM | POA: Diagnosis not present

## 2022-09-15 LAB — CBC AND DIFFERENTIAL
HCT: 42 (ref 41–53)
Hemoglobin: 14 (ref 13.5–17.5)
Neutrophils Absolute: 4913
Platelets: 218 10*3/uL (ref 150–400)
WBC: 8.5

## 2022-09-15 LAB — HEPATIC FUNCTION PANEL
ALT: 8 U/L — AB (ref 10–40)
AST: 14 (ref 14–40)
Alkaline Phosphatase: 101 (ref 25–125)
Bilirubin, Total: 0.5

## 2022-09-15 LAB — COMPREHENSIVE METABOLIC PANEL
Albumin: 3.2 — AB (ref 3.5–5.0)
Calcium: 8.5 — AB (ref 8.7–10.7)
Globulin: 2.1
eGFR: 80

## 2022-09-15 LAB — BASIC METABOLIC PANEL
BUN: 15 (ref 4–21)
CO2: 33 — AB (ref 13–22)
Chloride: 100 (ref 99–108)
Creatinine: 0.9 (ref 0.6–1.3)
Glucose: 89
Potassium: 4.3 mEq/L (ref 3.5–5.1)
Sodium: 140 (ref 137–147)

## 2022-09-15 LAB — CBC: RBC: 4.37 (ref 3.87–5.11)

## 2022-09-27 DIAGNOSIS — Z23 Encounter for immunization: Secondary | ICD-10-CM | POA: Diagnosis not present

## 2022-10-25 ENCOUNTER — Non-Acute Institutional Stay: Payer: Medicare Other | Admitting: Nurse Practitioner

## 2022-10-25 ENCOUNTER — Encounter: Payer: Self-pay | Admitting: Nurse Practitioner

## 2022-10-25 DIAGNOSIS — N138 Other obstructive and reflux uropathy: Secondary | ICD-10-CM

## 2022-10-25 DIAGNOSIS — I499 Cardiac arrhythmia, unspecified: Secondary | ICD-10-CM | POA: Diagnosis not present

## 2022-10-25 DIAGNOSIS — N401 Enlarged prostate with lower urinary tract symptoms: Secondary | ICD-10-CM

## 2022-10-25 DIAGNOSIS — I5032 Chronic diastolic (congestive) heart failure: Secondary | ICD-10-CM

## 2022-10-25 DIAGNOSIS — M545 Low back pain, unspecified: Secondary | ICD-10-CM

## 2022-10-25 DIAGNOSIS — R079 Chest pain, unspecified: Secondary | ICD-10-CM | POA: Diagnosis not present

## 2022-10-25 DIAGNOSIS — R001 Bradycardia, unspecified: Secondary | ICD-10-CM | POA: Diagnosis not present

## 2022-10-25 LAB — BASIC METABOLIC PANEL
BUN: 17 (ref 4–21)
CO2: 28 — AB (ref 13–22)
Chloride: 103 (ref 99–108)
Creatinine: 1 (ref 0.6–1.3)
Glucose: 133
Potassium: 4.2 mEq/L (ref 3.5–5.1)
Sodium: 141 (ref 137–147)

## 2022-10-25 LAB — TSH: TSH: 2.55 (ref 0.41–5.90)

## 2022-10-25 LAB — COMPREHENSIVE METABOLIC PANEL
Calcium: 8.8 (ref 8.7–10.7)
eGFR: 73

## 2022-10-25 NOTE — Progress Notes (Signed)
Location:  Other [facility].  Nursing Home Room Number: [facility] [address] Place of Service:  ALF [phone]) Abbey Chatters, NP  PCP: Earnestine Mealing, MD  Patient Care Team: Earnestine Mealing, MD as PCP - General (Family Medicine) Sharon Seller, NP as Nurse Practitioner (Geriatric Medicine)  Extended Emergency Contact Information Primary Emergency Contact: Othelia Pulling Address: [address]          [place], [place] [address] D[place] of [place] Home Phone: [phone] Relation: Spouse Secondary Emergency Contact: Reeder,Glenn Home Phone: [phone] Mobile Phone: [phone] Relation: Son  Goals of care: Advanced Directive information    10/25/2022    9:19 AM  Advanced Directives  Does Patient Have a Medical Advance Directive? Yes  Type of Advance Directive Out of facility DNR (pink MOST or yellow form)  Does patient want to make changes to medical advance directive? No - Patient declined     Chief Complaint  Patient presents with   Acute Visit    Severe Back Pain.    Medical Management of Chronic Issues    Routine follow up     HPI:  Pt is a 89 y.o. male seen today for an acute visit for Severe Back pain. Pt in [facility] and generally does not require assistive devices to move. While nursing was helping with care he reported he had severe back pain and could not get up from the chair.  He originally told nursing it was his upper back that hurt him but then pointed to lower back. No injuries or falls noted.  Denies numbness or tingling to lower legs. No weakness in lower legs.  Had a hard time moving to the edge of the chair.  No rashes or sores.   Also noted to have HR in the 40s while getting VS. He denies chest pains, shortness of breath, dizziness or headaches.    Past Medical History:  Diagnosis Date   Acute respiratory failure with hypoxia (HCC)    Per [facility]   BPH (benign prostatic hyperplasia)    Duodenal mass    Elevated  PSA    Fatty (change of) liver, not elsewhere classified    Per [facility]   Gross hematuria    Heart disease, unspecified    Per [facility]   Hyperlipidemia    Hypertension    Hypertrophy (benign) of prostate    asymptomatic on finasteride. will f/up with urologist [name]   Kidney lesion 07/22/2015   pt has appt at kidney specialist 08/05/15 to review CT scan.   Late onset Alzheimer disease (HCC)    Per [facility]   Obesity    Overweight    Peripheral vascular disease, unspecified (HCC)    Per [facility]   Tubular adenoma of colon    Urinary retention    Past Surgical History:  Procedure Laterality Date   APPENDECTOMY     cataracts     PULMONARY THROMBECTOMY Bilateral 04/21/2021   Procedure: PULMONARY THROMBECTOMY;  Surgeon: Annice Needy, MD;  Location: [facility] INVASIVE CV LAB;  Service: Cardiovascular;  Laterality: Bilateral;   right knee surgery Right 12-17/2017   TOTAL HIP ARTHROPLASTY Right 08/13/2015   Procedure: TOTAL HIP ARTHROPLASTY;  Surgeon: Juanell Fairly, MD;  Location: [facility] ORS;  Service: Orthopedics;  Laterality: Right;   Uvula Surgery  1990    Allergies  Allergen Reactions   No Known Allergies     Outpatient Encounter Medications as of 10/25/2022  Medication Sig  apixaban (ELIQUIS) 5 MG TABS tablet TAKE ONE TABLET BY MOUTH TWICE DAILY   bisacodyl (DULCOLAX) 10 MG suppository Place 10 mg rectally daily as needed for moderate constipation.   docusate sodium (COLACE) 100 MG capsule Take 2 capsules (200 mg total) by mouth at bedtime.   finasteride (PROSCAR) 5 MG tablet TAKE 1 TABLET BY MOUTH DAILY   furosemide (LASIX) 40 MG tablet Take 40 mg by mouth daily.   KETOCONAZOLE, TOPICAL, (NIZORAL) 1 % SHAM Apply topically. Apply to hair and scalp topically every day shift every Wed, Sat for treatment twice weekly with bath   Multiple Vitamins-Minerals (MULTIVITAMIN WITH MINERALS) tablet Take 1 tablet by mouth daily. At 1400   OXYGEN 2 lpm as needed to keep  sat>90%   polyethylene glycol (MIRALAX / GLYCOLAX) 17 g packet Take 17 g by mouth daily.   potassium chloride SA (KLOR-CON M) 20 MEQ tablet TAKE 1 TABLET BY MOUTH DAILY   tamsulosin (FLOMAX) 0.4 MG CAPS capsule TAKE 1 CAPSULE BY MOUTH ONCE DAILY   [DISCONTINUED] bismuth subsalicylate (PEPTO BISMOL) 262 MG/15ML suspension Take 30 mLs by mouth every hour as needed. Up to 6 doses in 24 hours   No facility-administered encounter medications on file as of 10/25/2022.    Review of Systems  Constitutional:  Negative for activity change, appetite change, fatigue and unexpected weight change.  HENT:  Negative for congestion and hearing loss.   Eyes: Negative.   Respiratory:  Negative for cough and shortness of breath.   Cardiovascular:  Negative for chest pain, palpitations and leg swelling.  Gastrointestinal:  Negative for abdominal pain, constipation and diarrhea.  Genitourinary:  Negative for difficulty urinating and dysuria.  Musculoskeletal:  Positive for back pain and myalgias.  Skin:  Negative for color change and wound.  Neurological:  Negative for dizziness and weakness.  Psychiatric/Behavioral:  Negative for agitation, behavioral problems and confusion.     Immunization History  Administered Date(s) Administered   Covid-19, Mrna,Vaccine(Spikevax)3yrs and older 09/27/2022   Fluad Quad(high Dose 65+) 03/06/2019, 04/17/2020   Hep A / Hep B 03/17/2016   Hepatitis B, ADULT 03/17/2016   Influenza Split 04/03/2012, 03/12/2013   Influenza,inj,Quad PF,6+ Mos 02/24/2015   Influenza-Unspecified 03/29/2014, 03/08/2017, 03/09/2018, 04/06/2021, 04/06/2022   PPD Test 04/23/2021   Pneumococcal Conjugate-13 05/07/2014   Pneumococcal Polysaccharide-23 03/26/2013, 04/12/2019   Td,absorbed, Preservative Free, Adult Use, Lf Unspecified 07/29/2009   Tdap 07/29/2009, 05/31/2016   Zoster, Live 04/26/2013   Pertinent  Health Maintenance Due  Topic Date Due   INFLUENZA VACCINE  01/19/2023       04/21/2021    2:15 PM 04/21/2021    8:00 PM 04/22/2021    7:35 AM 04/22/2021   10:00 PM 04/23/2021    7:45 AM  Fall Risk  (RETIRED) Patient Fall Risk Level High fall risk High fall risk High fall risk High fall risk High fall risk   Functional Status Survey:    Vitals:   10/25/22 0844 10/25/22 0952  BP: (!) 171/80 (!) 158/60  Pulse: (!) 59 (!) 40  Resp: (!) 23   Temp: 97.8 F (36.6 C)   SpO2: 92%   Weight: 233 lb 6.4 oz (105.9 kg)   Height: 5\' 8"  (1.727 m)    Body mass index is 35.49 kg/m. Physical Exam Constitutional:      General: He is not in acute distress.    Appearance: He is well-developed. He is not diaphoretic.  HENT:     Head: Normocephalic and atraumatic.  Right Ear: External ear normal.     Left Ear: External ear normal.     Mouth/Throat:     Pharynx: No oropharyngeal exudate.  Eyes:     Conjunctiva/sclera: Conjunctivae normal.     Pupils: Pupils are equal, round, and reactive to light.  Cardiovascular:     Rate and Rhythm: Normal rate and regular rhythm.     Heart sounds: Normal heart sounds.  Pulmonary:     Effort: Pulmonary effort is normal.     Breath sounds: Normal breath sounds.  Abdominal:     General: Bowel sounds are normal.     Palpations: Abdomen is soft.  Musculoskeletal:        General: No tenderness.     Cervical back: Normal range of motion and neck supple.     Right lower leg: No edema.     Left lower leg: No edema.  Skin:    General: Skin is warm and dry.  Neurological:     Mental Status: He is alert. Mental status is at baseline.     Motor: Weakness present.     Gait: Gait abnormal.     Labs reviewed: Recent Labs    03/28/22 1048 07/23/22 1917 09/15/22 0000  NA 140 134* 140  K 4.4 3.7 4.3  CL 99 99 100  CO2 30 22 33*  GLUCOSE 98 140*  --   BUN 23 20 15   CREATININE 1.19 1.00 0.9  CALCIUM 8.7* 8.0* 8.5*   Recent Labs    03/28/22 1048 07/23/22 1917 09/15/22 0000  AST 24 27 14   ALT 16 14 8*  ALKPHOS 93 98 101   BILITOT 0.8 1.0  --   PROT 6.6 5.9*  --   ALBUMIN 3.5 3.0* 3.2*   Recent Labs    03/18/22 0000 03/28/22 1048 07/23/22 1917 09/15/22 0000  WBC 8.9 8.2 12.4* 8.5  NEUTROABS 5,963.00 4.8  --  4,913.00  HGB 15.1 15.5 15.1 14.0  HCT 46 48.3 46.2 42  MCV  --  96.4 96.7  --   PLT 203 219 238 218   Lab Results  Component Value Date   TSH 1.98 04/05/2017   Lab Results  Component Value Date   HGBA1C 5.7 03/18/2022   Lab Results  Component Value Date   CHOL 109 04/17/2020   HDL 38 (L) 04/17/2020   LDLCALC 47 04/17/2020   LDLDIRECT 109.0 04/12/2019   TRIG 160 (H) 04/17/2020   CHOLHDL 2.9 04/17/2020    Significant Diagnostic Results in last 30 days:  No results found.  Assessment/Plan 1. Acute right-sided low back pain without sciatica -pt with increase in pain to low back and decrease in mobility To use heating pad to low back TID -tylenol 1000 mg by mouth every 8 hours as needed pain -will get xray of lumbar spine.   2. Bradycardia -noted but asymptomatic, will get EKG, will get bmp to rule out electrolyte imbalance.  Staff to continue to monitor VS  3. CHF (congestive heart failure), NYHA class I, chronic, diastolic (HCC) -stable, without worsening shortness of breath or edema.  -continues on lasix and potassium   4. BPH with obstruction/lower urinary tract symptoms Stable on flomax daily  5. Morbid obesity (HCC) --education provided on healthy weight loss through increase in physical activity and proper nutrition however limited due to being in facility  6. Dementia -Stable, no acute changes in cognitive or functional status, continue supportive care.     Janene Harvey. [facility] [place]  [facility] [phone]

## 2022-11-04 ENCOUNTER — Ambulatory Visit: Payer: Medicare Other | Attending: Cardiology | Admitting: Cardiology

## 2022-11-04 ENCOUNTER — Encounter: Payer: Self-pay | Admitting: Cardiology

## 2022-11-04 VITALS — BP 122/66 | HR 69 | Ht 68.0 in | Wt 234.0 lb

## 2022-11-04 DIAGNOSIS — I493 Ventricular premature depolarization: Secondary | ICD-10-CM | POA: Insufficient documentation

## 2022-11-04 DIAGNOSIS — I2699 Other pulmonary embolism without acute cor pulmonale: Secondary | ICD-10-CM | POA: Insufficient documentation

## 2022-11-04 DIAGNOSIS — I5032 Chronic diastolic (congestive) heart failure: Secondary | ICD-10-CM | POA: Insufficient documentation

## 2022-11-04 DIAGNOSIS — I499 Cardiac arrhythmia, unspecified: Secondary | ICD-10-CM | POA: Diagnosis not present

## 2022-11-04 DIAGNOSIS — E782 Mixed hyperlipidemia: Secondary | ICD-10-CM | POA: Diagnosis not present

## 2022-11-04 NOTE — Patient Instructions (Addendum)
Medication Instructions:  Your physician recommends that you continue on your current medications as directed. Please refer to the Current Medication list given to you today.  *If you need a refill on your cardiac medications before your next appointment, please call your pharmacy*   Lab Work: None  If you have labs (blood work) drawn today and your tests are completely normal, you will receive your results only by: MyChart Message (if you have MyChart) OR A paper copy in the mail If you have any lab test that is abnormal or we need to change your treatment, we will call you to review the results.   Testing/Procedures:  None      Follow-Up: At [facility], you and your health needs are our priority.  As part of our continuing mission to provide you with exceptional heart care, we have created designated Provider Care Teams.  These Care Teams include your primary Cardiologist (physician) and Advanced Practice Providers (APPs -  Physician Assistants and Nurse Practitioners) who all work together to provide you with the care you need, when you need it.   Your next appointment:   Follow up as needed.

## 2022-11-04 NOTE — Progress Notes (Signed)
Cardiology Office Note:   Date:  11/04/2022  ID:  Christopher Jenkins, DOB 19-Sep-1926, MRN [id number]  History of Present Illness:   Christopher Jenkins is a 89 y.o. male with past medical history of pulmonary embolism status post thrombectomy 04/2021 on apixaban, hyperlipidemia, Alzheimer's dementia, chronic respiratory failure with hypoxia on chronic oxygen, aortic atherosclerosis, hypertension, chronic diastolic congestive heart failure, who is here today for follow-up after previously being seen for an irregular heartbeat.  He was last seen in clinic 04/28/2022 by Dr.Agbor-Etnag.  At that time he been referred by his PCP for an irregular heartbeat.  EKG revealed sinus rhythm with PAC and PVCs bigeminal pattern.  Echocardiogram in 03/2022 revealed an EF of 50-55%.  At that time he continued to live in assisted living facility and stated that he was feeling well and denied any palpitations.  There were no indications for additional testing and no changes to his medications needed he was advised to follow-up as needed.  He returns to clinic today accompanied by CNA from [facility] facility.  Unfortunately he is from memory care and has no complaints today.  According to the aide that is with him today he occasionally complains of some shortness of breath and night when he is lying in bed and they have noticed during vital signs that his heart rate is in the 40s when they are manually counting his heart rate.  He has had no hospitalizations or visits to the emergency department.  ROS: Unable to adequately assess review of systems due to patient's Alzheimer's dementia  Studies Reviewed:    EKG: Sinus rhythm with sinus arrhythmia with a rate of 69 with frequent PVCs and occasional bigeminal pattern that is unchanged since 2022  TTE 03/28/22 1. Left ventricular ejection fraction, by estimation, is 50 to 55%. The  left ventricle has low normal function. Left ventricular endocardial  border not optimally  defined to evaluate regional wall motion. There is  mild left ventricular hypertrophy. Left  ventricular diastolic parameters are consistent with Grade I diastolic  dysfunction (impaired relaxation).   2. Right ventricular systolic function is normal. The right ventricular  size is normal. Tricuspid regurgitation signal is inadequate for assessing  PA pressure.   3. Left atrial size was mildly dilated.   4. The mitral valve is normal in structure. No evidence of mitral valve  regurgitation. No evidence of mitral stenosis.   5. The aortic valve is calcified. Aortic valve regurgitation is not  visualized. Aortic valve sclerosis/calcification is present, without any  evidence of aortic stenosis.    Risk Assessment/Calculations:              Physical Exam:   VS:  BP 122/66 (BP Location: Left Arm, Patient Position: Sitting, Cuff Size: Large)   Pulse 69   Ht 5\' 8"  (1.727 m)   Wt 234 lb (106.1 kg)   BMI 35.58 kg/m    Wt Readings from Last 3 Encounters:  11/04/22 234 lb (106.1 kg)  10/25/22 233 lb 6.4 oz (105.9 kg)  07/25/22 240 lb 8 oz (109.1 kg)     GEN: Well nourished, well developed in no acute distress NECK: No JVD; No carotid bruits CARDIAC: RRR, no murmurs, rubs, gallops RESPIRATORY:  Clear to auscultation without rales, wheezing or rhonchi  ABDOMEN: Soft, non-tender, obese, non-distended EXTREMITIES: Trace pretibial edema; No deformity   ASSESSMENT AND PLAN:   Irregular heartbeat with PVCs noted on EKG.  Occasional bigeminal pattern.  This is unchanged from previous  EKGs that we have been reviewed from 20 and 22 and his most recent visit 04/28/2022.  There was some concern when doing vital signs that he had a lower heart rate it was explained to the CTA that was with him today that unfortunately manual counting of his heart rate when he is having episodes of PVCs are unable to be felt and therefore the heart rate appears to be lower than what it actually is.  Patient is  asymptomatic.  There is no indication for additional testing or cardiac monitor.  Hyperlipidemia with an LDL of 138.  Remains off of statin therapy.  This continues to be monitored by his PCP.  Shortness of breath with a history of chronic diastolic heart failure with LVEF 50-55% 03/2022.  Patient is continued on furosemide 40 mg daily and potassium supplements.  Denies any shortness of breath when asked, but review of systems are limited due to his dementia.  No changes in his medication today.  History of PE where he is continued on chronic apixaban 5 mg twice daily.  This continues to be monitored by his primary care provider.  Disposition patient return to clinic to see MD/APP on an as-needed basis.        Signed, Shavon Zenz, NP

## 2022-11-08 ENCOUNTER — Non-Acute Institutional Stay (INDEPENDENT_AMBULATORY_CARE_PROVIDER_SITE_OTHER): Payer: Medicare Other | Admitting: Nurse Practitioner

## 2022-11-08 ENCOUNTER — Encounter: Payer: Self-pay | Admitting: Nurse Practitioner

## 2022-11-08 DIAGNOSIS — Z0000 Encounter for general adult medical examination without abnormal findings: Secondary | ICD-10-CM | POA: Diagnosis not present

## 2022-11-08 NOTE — Progress Notes (Signed)
Subjective:   Christopher Jenkins is a 89 y.o. male who presents for Medicare Annual/Subsequent preventive examination.  Review of Systems     Cardiac Risk Factors include: advanced age (>14men, >58 women);obesity (BMI >30kg/m2);sedentary lifestyle;hypertension;male gender     Objective:    Today's Vitals   11/08/22 1057 11/08/22 1108  BP: (!) 144/69 112/61  Pulse: (!) 55   Resp: (!) 22   Temp: (!) 97.3 F (36.3 C)   SpO2: 90%   Weight: 233 lb 6.4 oz (105.9 kg)   Height: 5\' 8"  (1.727 m)    Body mass index is 35.49 kg/m.     11/08/2022   11:05 AM 10/25/2022    9:19 AM 07/25/2022    8:57 AM 07/23/2022    7:16 PM 05/05/2022   12:04 PM 03/16/2022    9:49 AM 04/21/2021    6:52 PM  Advanced Directives  Does Patient Have a Medical Advance Directive? Yes Yes Yes Yes Yes No   Type of Advance Directive Out of facility DNR (pink MOST or yellow form) Out of facility DNR (pink MOST or yellow form) Out of facility DNR (pink MOST or yellow form) Out of facility DNR (pink MOST or yellow form) Healthcare Power of [place];Living will;Out of facility DNR (pink MOST or yellow form)    Does patient want to make changes to medical advance directive? No - Patient declined No - Patient declined No - Patient declined  No - Patient declined    Would patient like information on creating a medical advance directive?      No - Patient declined No - Patient declined  Pre-existing out of facility DNR order (yellow form or pink MOST form)     Yellow form placed in chart (order not valid for inpatient use)      Current Medications (verified) Outpatient Encounter Medications as of 11/08/2022  Medication Sig   acetaminophen (TYLENOL) 500 MG tablet Take 1,000 mg by mouth every 8 (eight) hours as needed.   apixaban (ELIQUIS) 5 MG TABS tablet TAKE ONE TABLET BY MOUTH TWICE DAILY   bisacodyl (DULCOLAX) 10 MG suppository Place 10 mg rectally daily as needed for moderate constipation.   Calcium Carb-Cholecalciferol  (CALCIUM 600+D3) 600-20 MG-MCG TABS Take 1 tablet by mouth at bedtime.   docusate sodium (COLACE) 100 MG capsule Take 2 capsules (200 mg total) by mouth at bedtime.   finasteride (PROSCAR) 5 MG tablet TAKE 1 TABLET BY MOUTH DAILY   furosemide (LASIX) 40 MG tablet Take 40 mg by mouth daily.   KETOCONAZOLE, TOPICAL, (NIZORAL) 1 % SHAM Apply topically. Apply to hair and scalp topically every day shift every Wed, Sat for treatment twice weekly with bath   KLOR-CON 20 MEQ packet Take 20 mEq by mouth daily.   Multiple Vitamins-Minerals (MULTIVITAMIN WITH MINERALS) tablet Take 1 tablet by mouth daily. At 1400   OXYGEN 2 lpm as needed to keep sat>90%   polyethylene glycol (MIRALAX / GLYCOLAX) 17 g packet Take 17 g by mouth daily.   tamsulosin (FLOMAX) 0.4 MG CAPS capsule TAKE 1 CAPSULE BY MOUTH ONCE DAILY   No facility-administered encounter medications on file as of 11/08/2022.    Allergies (verified) No known allergies   History: Past Medical History:  Diagnosis Date   Acute respiratory failure with hypoxia (HCC)    Per [facility]   BPH (benign prostatic hyperplasia)    Duodenal mass    Elevated PSA    Fatty (change of) liver, not elsewhere classified  Per [facility]   Gross hematuria    Heart disease, unspecified    Per [facility]   Hyperlipidemia    Hypertension    Hypertrophy (benign) of prostate    asymptomatic on finasteride. will f/up with urologist [name]   Kidney lesion 07/22/2015   pt has appt at kidney specialist 08/05/15 to review CT scan.   Late onset Alzheimer disease (HCC)    Per [facility]   Obesity    Overweight    Peripheral vascular disease, unspecified (HCC)    Per [facility]   Tubular adenoma of colon    Urinary retention    Past Surgical History:  Procedure Laterality Date   APPENDECTOMY     cataracts     PULMONARY THROMBECTOMY Bilateral 04/21/2021   Procedure: PULMONARY THROMBECTOMY;  Surgeon: Annice Needy, MD;  Location: [facility] INVASIVE CV LAB;   Service: Cardiovascular;  Laterality: Bilateral;   right knee surgery Right 12-17/2017   TOTAL HIP ARTHROPLASTY Right 08/13/2015   Procedure: TOTAL HIP ARTHROPLASTY;  Surgeon: Juanell Fairly, MD;  Location: [facility] ORS;  Service: Orthopedics;  Laterality: Right;   Uvula Surgery  1990   Family History  Problem Relation Age of Onset   Heart disease Mother    Stroke Mother    Heart disease Father    Kidney disease Neg Hx    Prostate cancer Neg Hx    Kidney cancer Neg Hx    Bladder Cancer Neg Hx    Social History   Socioeconomic History   Marital status: Married    Spouse name: Not on file   Number of children: 3   Years of education: Not on file   Highest education level: Not on file  Occupational History   Occupation: Retired  Tobacco Use   Smoking status: Never   Smokeless tobacco: Never  Vaping Use   Vaping Use: Not on file  Substance and Sexual Activity   Alcohol use: No    Comment: Occasional glass of wine 3-4 times a year.   Drug use: No   Sexual activity: Never  Other Topics Concern   Not on file  Social History Narrative   2 sons, 1 daughter      Living will?   Sons/daughter would be medical decision makers   Has DNR   No tube feeds if cognitively unaware   Social Determinants of Health   Financial Resource Strain: Low Risk  (02/23/2018)   Overall Financial Resource Strain (CARDIA)    Difficulty of Paying Living Expenses: Not hard at all  Food Insecurity: No Food Insecurity (02/23/2018)   Hunger Vital Sign    Worried About Running Out of Food in the Last Year: Never true    Ran Out of Food in the Last Year: Never true  Transportation Needs: No Transportation Needs (02/23/2018)   PRAPARE - Administrator, Civil Service (Medical): No    Lack of Transportation (Non-Medical): No  Physical Activity: Insufficiently Active (02/23/2018)   Exercise Vital Sign    Days of Exercise per Week: 5 days    Minutes of Exercise per Session: 20 min  Stress: No Stress  Concern Present (02/23/2018)   [employer] of Occupational Health - Occupational Stress Questionnaire    Feeling of Stress : Not at all  Social Connections: Not on file    Tobacco Counseling Counseling given: Not Answered   Clinical Intake:  Pre-visit preparation completed: Yes  Pain : No/denies pain  BMI - recorded: 35 Nutritional Status: BMI > 30  Obese Diabetes: No  How often do you need to have someone help you when you read instructions, pamphlets, or other written materials from your doctor or pharmacy?: 5 - Always  Diabetic?no         Activities of Daily Living    11/08/2022    8:45 AM 11/08/2022    8:44 AM  In your present state of health, do you have any difficulty performing the following activities:  Hearing? 1   Vision? 0   Difficulty concentrating or making decisions?  1  Walking or climbing stairs?  1  Dressing or bathing?  1  Doing errands, shopping?  1  Preparing Food and eating ?  Y  Using the Toilet?  Y  In the past six months, have you accidently leaked urine?  Y  Do you have problems with loss of bowel control?  N  Managing your Medications?  Y  Managing your Finances?  Y  Housekeeping or managing your Housekeeping?  Y    Patient Care Team: Earnestine Mealing, MD as PCP - General (Family Medicine) Sharon Seller, NP as Nurse Practitioner (Geriatric Medicine)  Indicate any recent Medical Services you may have received from other than [facility] providers in the past year (date may be approximate).     Assessment:   This is a routine wellness examination for Gia.  Hearing/Vision screen No results found.  Dietary issues and exercise activities discussed: Current Exercise Habits: The patient does not participate in regular exercise at present   Goals Addressed   None    Depression Screen    11/08/2022    8:45 AM 04/26/2019    4:43 PM 02/23/2018    1:15 PM 02/22/2017    1:21 PM 10/06/2016    9:04 AM 09/02/2015    2:53 PM  06/06/2012    2:19 PM  PHQ 2/9 Scores  PHQ - 2 Score  0 0 0 0 0 0  PHQ- 9 Score    0     Exception Documentation Other- indicate reason in comment box        Not completed dementia          Fall Risk    11/08/2022   12:24 PM 11/08/2022    8:45 AM 04/26/2019    4:43 PM 04/12/2019    2:08 PM 02/23/2018    1:15 PM  Fall Risk   Falls in the past year? 1 0 0 0 No  Number falls in past yr: 1      Injury with Fall? 0      Risk for fall due to : History of fall(s);Impaired balance/gait      Follow up Education provided;Falls prevention discussed  Falls evaluation completed Falls evaluation completed     FALL RISK PREVENTION PERTAINING TO THE HOME:  Any stairs in or around the home? No  If so, are there any without handrails? na Home free of loose throw rugs in walkways, pet beds, electrical cords, etc? Yes  Adequate lighting in your home to reduce risk of falls? Yes   ASSISTIVE DEVICES UTILIZED TO PREVENT FALLS:  Life alert? No  Use of a cane, walker or w/c? Yes  Grab bars in the bathroom? Yes  Shower chair or bench in shower? Yes  Elevated toilet seat or a handicapped toilet? Yes   TIMED UP AND GO:  Was the test performed? No .    Cognitive Function:  02/23/2018    1:23 PM 02/22/2017    1:58 PM 09/02/2015    2:58 PM  MMSE - Mini Mental State Exam  Orientation to time 4 5 5   Orientation to time comments Wrong time shown on clock face. 10:55 drawn; needed 11:10.     Orientation to Place 5 5 5   Registration 3 3 3   Attention/ Calculation 5 5 5   Recall 1 3 3   Language- name 2 objects 2 2 2   Language- repeat 1 1 1   Language- follow 3 step command 3 3 3   Language- read & follow direction 1 1 1   Write a sentence 1 1 1   Copy design 1 1 1   Total score 27 30 30         Immunizations Immunization History  Administered Date(s) Administered   Covid-19, Mrna,Vaccine(Spikevax)54yrs and older 09/27/2022   Fluad Quad(high Dose 65+) 03/06/2019, 04/17/2020   Hep A / Hep B  03/17/2016   Hepatitis B, ADULT 03/17/2016   Influenza Split 04/03/2012, 03/12/2013   Influenza,inj,Quad PF,6+ Mos 02/24/2015   Influenza-Unspecified 03/29/2014, 03/08/2017, 03/09/2018, 04/06/2021, 04/06/2022   PPD Test 04/23/2021   Pneumococcal Conjugate-13 05/07/2014   Pneumococcal Polysaccharide-23 03/26/2013, 04/12/2019   Td,absorbed, Preservative Free, Adult Use, Lf Unspecified 07/29/2009   Tdap 07/29/2009, 05/31/2016   Zoster, Live 04/26/2013    TDAP status: Up to date  Flu Vaccine status: Up to date  Pneumococcal vaccine status: Up to date  Covid-19 vaccine status: Information provided on how to obtain vaccines.   Qualifies for Shingles Vaccine? Yes   Zostavax completed Yes   Shingrix Completed?: No.    Education has been provided regarding the importance of this vaccine. Patient has been advised to call insurance company to determine out of pocket expense if they have not yet received this vaccine. Advised may also receive vaccine at local pharmacy or Health Dept. Verbalized acceptance and understanding.  Screening Tests Health Maintenance  Topic Date Due   Zoster Vaccines- Shingrix (1 of 2) Never done   INFLUENZA VACCINE  01/19/2023   Medicare Annual Wellness (AWV)  11/08/2023   DTaP/Tdap/Td (4 - Td or Tdap) 05/31/2026   Pneumonia Vaccine 55+ Years old  Completed   COVID-19 Vaccine  Completed   HPV VACCINES  Aged Out    Health Maintenance  Health Maintenance Due  Topic Date Due   Zoster Vaccines- Shingrix (1 of 2) Never done    Colorectal cancer screening: No longer required.   Lung Cancer Screening: (Low Dose CT Chest recommended if Age 48-80 years, 30 pack-year currently smoking OR have quit w/in 15years.) does not qualify.   Lung Cancer Screening Referral: na  Additional Screening:  Hepatitis C Screening: does not qualify; Completed   Vision Screening: Recommended annual ophthalmology exams for early detection of glaucoma and other disorders of the  eye. Is the patient up to date with their annual eye exam?  No  Due to dementia  Dental Screening: Recommended annual dental exams for proper oral hygiene  Community Resource Referral / Chronic Care Management: CRR required this visit?  No   CCM required this visit?  No      Plan:     I have personally reviewed and noted the following in the patient's chart:   Medical and social history Use of alcohol, tobacco or illicit drugs  Current medications and supplements including opioid prescriptions. Patient is not currently taking opioid prescriptions. Functional ability and status Nutritional status Physical activity Advanced directives List of other physicians Hospitalizations, surgeries,  and ER visits in previous 12 months Vitals Screenings to include cognitive, depression, and falls Referrals and appointments  In addition, I have reviewed and discussed with patient certain preventive protocols, quality metrics, and best practice recommendations. A written personalized care plan for preventive services as well as general preventive health recommendations were provided to patient.     Sharon Seller, NP   11/08/2022   Place of service: [facility]

## 2022-11-08 NOTE — Patient Instructions (Signed)
  Mr. Christopher Jenkins , Thank you for taking time to come for your Medicare Wellness Visit. I appreciate your ongoing commitment to your health goals. Please review the following plan we discussed and let me know if I can assist you in the future.     This is a list of the screening recommended for you and due dates:  Health Maintenance  Topic Date Due   Zoster (Shingles) Vaccine (1 of 2) Never done   Flu Shot  01/19/2023   Medicare Annual Wellness Visit  11/08/2023   DTaP/Tdap/Td vaccine (4 - Td or Tdap) 05/31/2026   Pneumonia Vaccine  Completed   COVID-19 Vaccine  Completed   HPV Vaccine  Aged Out

## 2023-01-12 DIAGNOSIS — Z79899 Other long term (current) drug therapy: Secondary | ICD-10-CM | POA: Diagnosis not present

## 2023-01-12 LAB — COMPREHENSIVE METABOLIC PANEL
Albumin: 3.1 — AB (ref 3.5–5.0)
Calcium: 8.7 (ref 8.7–10.7)
Globulin: 2.2
eGFR: 83

## 2023-01-12 LAB — HEPATIC FUNCTION PANEL
ALT: 10 U/L (ref 10–40)
AST: 13 — AB (ref 14–40)
Alkaline Phosphatase: 98 (ref 25–125)
Bilirubin, Total: 0.5

## 2023-01-12 LAB — BASIC METABOLIC PANEL
BUN: 16 (ref 4–21)
CO2: 33 — AB (ref 13–22)
Chloride: 100 (ref 99–108)
Creatinine: 0.8 (ref 0.6–1.3)
Glucose: 87
Potassium: 4.1 mEq/L (ref 3.5–5.1)
Sodium: 140 (ref 137–147)

## 2023-01-12 LAB — CBC AND DIFFERENTIAL
HCT: 43 (ref 41–53)
Hemoglobin: 14 (ref 13.5–17.5)
Neutrophils Absolute: 4756
Platelets: 223 10*3/uL (ref 150–400)
WBC: 8.2

## 2023-01-12 LAB — CBC: RBC: 4.48 (ref 3.87–5.11)

## 2023-01-23 NOTE — Progress Notes (Unsigned)
01/24/2023 11:57 AM   Christopher Jenkins 08/20/1926 [id number]  Referring provider: Karie Schwalbe, MD [address] [place],  [place] [address]  Urological history: 1. Nocturia -contributing factors of obstructive sleep apnea, hypertension, heart disease, cognitive decline and BPH -sleeps with CPAP   2. High risk hematuria -non-smoker -CTU in 2017 and 07/2016.  He was found to have a Bosniak 5F on the CT in 07/2015 and it remained unchanged on 07/2016 CTU -cysto 2021 NED -cytology 2021 neg  3. Bosniak 2 cyst -MRI 05/2019 The lesion of concern adjacent to the cystic lesion in the lower pole of the left kidney has minimally enlarged compared with most recent study, although shows no definite enhancement on this examination, most consistent with a complex cyst (Bosniak 2).  Additional simple renal cysts bilaterally. No enhancing renal masses identified. -contrast CT (07/2022) - Large bilateral Bosniak class 1 cysts, measuring 7.2 cm on the right and 7.5 cm on the left.   4. BPH with LU TS -cysto 2021 Trabeculated with cellules and small posterior wall diverticula -tamsulosin 0.4 mg daily and finasteride 5 mg daily  No chief complaint on file.  HPI: Christopher Jenkins is a 89 y.o. male who presents today for yearly follow up.   Previous records reviewed.  Has Alzheimer's dementia.    I PSS ***     Score:  1-7 Mild 8-19 Moderate 20-35 Severe   PMH: Past Medical History:  Diagnosis Date   Acute respiratory failure with hypoxia (HCC)    Per [facility]   BPH (benign prostatic hyperplasia)    Duodenal mass    Elevated PSA    Fatty (change of) liver, not elsewhere classified    Per [facility]   Gross hematuria    Heart disease, unspecified    Per [facility]   Hyperlipidemia    Hypertension    Hypertrophy (benign) of prostate    asymptomatic on finasteride. will f/up with urologist [name]   Kidney lesion 07/22/2015   pt has appt at kidney specialist  08/05/15 to review CT scan.   Late onset Alzheimer disease (HCC)    Per [facility]   Obesity    Overweight    Peripheral vascular disease, unspecified (HCC)    Per [facility]   Tubular adenoma of colon    Urinary retention     Surgical History: Past Surgical History:  Procedure Laterality Date   APPENDECTOMY     cataracts     PULMONARY THROMBECTOMY Bilateral 04/21/2021   Procedure: PULMONARY THROMBECTOMY;  Surgeon: Annice Needy, MD;  Location: [facility] INVASIVE CV LAB;  Service: Cardiovascular;  Laterality: Bilateral;   right knee surgery Right 12-17/2017   TOTAL HIP ARTHROPLASTY Right 08/13/2015   Procedure: TOTAL HIP ARTHROPLASTY;  Surgeon: Juanell Fairly, MD;  Location: [facility] ORS;  Service: Orthopedics;  Laterality: Right;   Uvula Surgery  1990    Home Medications:  Allergies as of 01/24/2023       Reactions   No Known Allergies         Medication List        Accurate as of January 23, 2023 11:57 AM. If you have any questions, ask your nurse or doctor.          acetaminophen 500 MG tablet Commonly known as: TYLENOL Take 1,000 mg by mouth every 8 (eight) hours as needed.   bisacodyl 10 MG suppository Commonly known as: DULCOLAX Place 10 mg rectally daily as  needed for moderate constipation.   Calcium 600+D3 600-20 MG-MCG Tabs Generic drug: Calcium Carb-Cholecalciferol Take 1 tablet by mouth at bedtime.   docusate sodium 100 MG capsule Commonly known as: Colace Take 2 capsules (200 mg total) by mouth at bedtime.   Eliquis 5 MG Tabs tablet Generic drug: apixaban TAKE ONE TABLET BY MOUTH TWICE DAILY   finasteride 5 MG tablet Commonly known as: PROSCAR TAKE 1 TABLET BY MOUTH DAILY   furosemide 40 MG tablet Commonly known as: LASIX Take 40 mg by mouth daily.   Klor-Con 20 MEQ packet Generic drug: potassium chloride Take 20 mEq by mouth daily.   multivitamin with minerals tablet Take 1 tablet by mouth daily. At 1400   Nizoral 1 % Sham Generic drug:  KETOCONAZOLE (TOPICAL) Apply topically. Apply to hair and scalp topically every day shift every Wed, Sat for treatment twice weekly with bath   OXYGEN 2 lpm as needed to keep sat>90%   polyethylene glycol 17 g packet Commonly known as: MIRALAX / GLYCOLAX Take 17 g by mouth daily.   tamsulosin 0.4 MG Caps capsule Commonly known as: FLOMAX TAKE 1 CAPSULE BY MOUTH ONCE DAILY        Allergies:  Allergies  Allergen Reactions   No Known Allergies     Family History: Family History  Problem Relation Age of Onset   Heart disease Mother    Stroke Mother    Heart disease Father    Kidney disease Neg Hx    Prostate cancer Neg Hx    Kidney cancer Neg Hx    Bladder Cancer Neg Hx     Social History:  reports that he has never smoked. He has never used smokeless tobacco. He reports that he does not drink alcohol and does not use drugs.  ROS: Pertinent ROS in HPI  Physical Exam: There were no vitals taken for this visit.  Constitutional:  Well nourished. Alert and oriented, No acute distress. HEENT: [place] AT, moist mucus membranes.  Trachea midline Cardiovascular: No clubbing, cyanosis, or edema. Respiratory: Normal respiratory effort, no increased work of breathing. Neurologic: Grossly intact, no focal deficits, moving all 4 extremities. Psychiatric: Normal mood and affect.  Laboratory Data: Lab Results  Component Value Date   WBC 8.5 09/15/2022   HGB 14.0 09/15/2022   HCT 42 09/15/2022   MCV 96.7 07/23/2022   PLT 218 09/15/2022    Lab Results  Component Value Date   CREATININE 1.0 10/25/2022    Lab Results  Component Value Date   HGBA1C 5.7 03/18/2022    Lab Results  Component Value Date   TSH 2.55 10/25/2022    Lab Results  Component Value Date   AST 14 09/15/2022   Lab Results  Component Value Date   ALT 8 (A) 09/15/2022  I have reviewed the labs.   Pertinent Imaging: N/A  Assessment & Plan:  ***  1. BPH with LU TS -continue tamsulosin 0.4  mg daily -discontinue finasteride 5 mg daily  No follow-ups on file.  These notes generated with voice recognition software. I apologize for typographical errors.  Cloretta Ned  [facility] [address]  [address] [place], [place] [address] [phone]

## 2023-01-24 ENCOUNTER — Ambulatory Visit (INDEPENDENT_AMBULATORY_CARE_PROVIDER_SITE_OTHER): Payer: Medicare Other | Admitting: Urology

## 2023-01-24 VITALS — BP 145/74 | HR 90 | Ht 68.0 in | Wt 233.4 lb

## 2023-01-24 DIAGNOSIS — N401 Enlarged prostate with lower urinary tract symptoms: Secondary | ICD-10-CM | POA: Diagnosis not present

## 2023-01-24 MED ORDER — TAMSULOSIN HCL 0.4 MG PO CAPS: 0.4000 mg | ORAL_CAPSULE | Freq: Every day | ORAL | 3 refills | Status: AC

## 2023-01-25 ENCOUNTER — Encounter: Payer: Self-pay | Admitting: Urology

## 2023-01-30 ENCOUNTER — Encounter: Payer: Self-pay | Admitting: Student

## 2023-01-30 NOTE — Progress Notes (Unsigned)
A user error has taken place: encounter opened in error, closed for administrative reasons.

## 2023-02-01 ENCOUNTER — Encounter: Payer: Self-pay | Admitting: Student

## 2023-02-01 ENCOUNTER — Non-Acute Institutional Stay: Payer: Medicare Other | Admitting: Student

## 2023-02-01 DIAGNOSIS — I4819 Other persistent atrial fibrillation: Secondary | ICD-10-CM | POA: Diagnosis not present

## 2023-02-01 DIAGNOSIS — J9611 Chronic respiratory failure with hypoxia: Secondary | ICD-10-CM

## 2023-02-01 DIAGNOSIS — I5032 Chronic diastolic (congestive) heart failure: Secondary | ICD-10-CM | POA: Diagnosis not present

## 2023-02-01 DIAGNOSIS — R001 Bradycardia, unspecified: Secondary | ICD-10-CM | POA: Diagnosis not present

## 2023-02-01 DIAGNOSIS — Z9981 Dependence on supplemental oxygen: Secondary | ICD-10-CM

## 2023-02-01 DIAGNOSIS — N401 Enlarged prostate with lower urinary tract symptoms: Secondary | ICD-10-CM

## 2023-02-01 DIAGNOSIS — N138 Other obstructive and reflux uropathy: Secondary | ICD-10-CM

## 2023-02-01 DIAGNOSIS — G4733 Obstructive sleep apnea (adult) (pediatric): Secondary | ICD-10-CM | POA: Diagnosis not present

## 2023-02-01 NOTE — Progress Notes (Signed)
Location:  Other [facility].  Nursing Home Room Number: [facility] 205A Place of Service:  ALF [phone]) Provider:  Earnestine Mealing, MD  Patient Care Team: Earnestine Mealing, MD as PCP - General (Family Medicine) Sharon Seller, NP as Nurse Practitioner (Geriatric Medicine)  Extended Emergency Contact Information Primary Emergency Contact: Othelia Pulling Address: [address]          [place], [place] [address] Darden Amber of [place] Home Phone: [phone] Relation: Spouse Secondary Emergency Contact: Tur[place],Glenn Home Phone: [phone] Mobile Phone: [phone] Relation: Son  Code Status:  DNR Goals of care: Advanced Directive information    02/01/2023    9:48 AM  Advanced Directives  Does Patient Have a Medical Advance Directive? Yes  Type of Estate agent of [place];Out of facility DNR (pink MOST or yellow form)  Does patient want to make changes to medical advance directive? No - Patient declined  Copy of Healthcare Power of Attorney in Chart? Yes - validated most recent copy scanned in chart (See row information)     Chief Complaint  Patient presents with   Medical Management of Chronic Issues    Medical Management of Chronic Issues.     HPI:  Pt is a 89 y.o. male seen today for medical management of chronic diseases.    Patient is doing well. He has no specific complaints. He wears his CPAP machine nightly. Denies chest pain, shortness of breath.   His wife is present in the room.   Nursing without concerns. He and his wife are pleasant and remain independent for most tasks.   Past Medical History:  Diagnosis Date   Acute respiratory failure with hypoxia (HCC)    Per [facility]   BPH (benign prostatic hyperplasia)    Duodenal mass    Elevated PSA    Fatty (change of) liver, not elsewhere classified    Per [facility]   Gross hematuria    Heart disease, unspecified    Per [facility]   Hyperlipidemia    Hypertension     Hypertrophy (benign) of prostate    asymptomatic on finasteride. will f/up with urologist [name]   Kidney lesion 07/22/2015   pt has appt at kidney specialist 08/05/15 to review CT scan.   Late onset Alzheimer disease (HCC)    Per [facility]   Obesity    Overweight    Peripheral vascular disease, unspecified (HCC)    Per [facility]   Tubular adenoma of colon    Urinary retention    Past Surgical History:  Procedure Laterality Date   APPENDECTOMY     cataracts     PULMONARY THROMBECTOMY Bilateral 04/21/2021   Procedure: PULMONARY THROMBECTOMY;  Surgeon: Annice Needy, MD;  Location: [facility] INVASIVE CV LAB;  Service: Cardiovascular;  Laterality: Bilateral;   right knee surgery Right 12-17/2017   TOTAL HIP ARTHROPLASTY Right 08/13/2015   Procedure: TOTAL HIP ARTHROPLASTY;  Surgeon: Juanell Fairly, MD;  Location: [facility] ORS;  Service: Orthopedics;  Laterality: Right;   Uvula Surgery  1990    Allergies  Allergen Reactions   No Known Allergies     Outpatient Encounter Medications as of 02/01/2023  Medication Sig   acetaminophen (TYLENOL) 500 MG tablet Take 1,000 mg by mouth every 8 (eight) hours as needed.   apixaban (ELIQUIS) 5 MG TABS tablet TAKE ONE TABLET BY MOUTH TWICE DAILY   bisacodyl (DULCOLAX) 10 MG suppository Place 10 mg rectally daily as needed for moderate constipation.   Calcium  Carb-Cholecalciferol (CALCIUM 600+D3) 600-20 MG-MCG TABS Take 1 tablet by mouth at bedtime.   docusate sodium (COLACE) 100 MG capsule Take 2 capsules (200 mg total) by mouth at bedtime.   furosemide (LASIX) 40 MG tablet Take 40 mg by mouth daily.   KETOCONAZOLE, TOPICAL, (NIZORAL) 1 % SHAM Apply topically. Apply to hair and scalp topically every day shift every Wed, Sat for treatment twice weekly with bath   KLOR-CON 20 MEQ packet Take 20 mEq by mouth daily.   Multiple Vitamins-Minerals (MULTIVITAMIN WITH MINERALS) tablet Take 1 tablet by mouth daily. At 1400   OXYGEN 2 lpm as needed to keep  sat>90%   polyethylene glycol (MIRALAX / GLYCOLAX) 17 g packet Take 17 g by mouth daily.   tamsulosin (FLOMAX) 0.4 MG CAPS capsule Take 1 capsule (0.4 mg total) by mouth daily.   No facility-administered encounter medications on file as of 02/01/2023.    Review of Systems  Immunization History  Administered Date(s) Administered   Covid-19, Mrna,Vaccine(Spikevax)43yrs and older 09/27/2022   Fluad Quad(high Dose 65+) 03/06/2019, 04/17/2020   Hep A / Hep B 03/17/2016   Hepatitis B, ADULT 03/17/2016   Influenza Split 04/03/2012, 03/12/2013   Influenza,inj,Quad PF,6+ Mos 02/24/2015   Influenza-Unspecified 03/29/2014, 03/08/2017, 03/09/2018, 04/06/2021, 04/06/2022   PPD Test 04/23/2021   Pneumococcal Conjugate-13 05/07/2014   Pneumococcal Polysaccharide-23 03/26/2013, 04/12/2019   Td,absorbed, Preservative Free, Adult Use, Lf Unspecified 07/29/2009   Tdap 07/29/2009, 05/31/2016   Zoster, Live 04/26/2013   Pertinent  Health Maintenance Due  Topic Date Due   INFLUENZA VACCINE  01/19/2023      04/22/2021    7:35 AM 04/22/2021   10:00 PM 04/23/2021    7:45 AM 11/08/2022    8:45 AM 11/08/2022   12:24 PM  Fall Risk  Falls in the past year?    0 1  Was there an injury with Fall?     0  Fall Risk Category Calculator     2  (RETIRED) Patient Fall Risk Level High fall risk High fall risk High fall risk    Patient at Risk for Falls Due to     History of fall(s);Impaired balance/gait  Fall risk Follow up     Education provided;Falls prevention discussed   Functional Status Survey:    Vitals:   02/01/23 0933 02/01/23 0950  BP: (!) 149/68 (!) 150/71  Pulse: 68   Resp: 16   Temp: 97.7 F (36.5 C)   SpO2: 91%   Weight: 237 lb 6.4 oz (107.7 kg)   Height: 5\' 8"  (1.727 m)    Body mass index is 36.1 kg/m. Physical Exam Constitutional:      Comments: Laying in bed  Cardiovascular:     Rate and Rhythm: Normal rate.     Pulses: Normal pulses.     Heart sounds: Normal heart sounds.   Pulmonary:     Effort: Pulmonary effort is normal.     Breath sounds: Normal breath sounds.  Skin:    General: Skin is warm and dry.     Labs reviewed: Recent Labs    03/28/22 1048 07/23/22 1917 09/15/22 0000 10/25/22 0000 01/12/23 0000  NA 140 134* 140 141 140  K 4.4 3.7 4.3 4.2 4.1  CL 99 99 100 103 100  CO2 30 22 33* 28* 33*  GLUCOSE 98 140*  --   --   --   BUN 23 20 15 17 16   CREATININE 1.19 1.00 0.9 1.0 0.8  CALCIUM 8.7* 8.0*  8.5* 8.8 8.7   Recent Labs    03/28/22 1048 07/23/22 1917 09/15/22 0000 01/12/23 0000  AST 24 27 14  13*  ALT 16 14 8* 10  ALKPHOS 93 98 101 98  BILITOT 0.8 1.0  --   --   PROT 6.6 5.9*  --   --   ALBUMIN 3.5 3.0* 3.2* 3.1*   Recent Labs    03/28/22 1048 07/23/22 1917 09/15/22 0000 01/12/23 0000  WBC 8.2 12.4* 8.5 8.2  NEUTROABS 4.8  --  4,913.00 4,756.00  HGB 15.5 15.1 14.0 14.0  HCT 48.3 46.2 42 43  MCV 96.4 96.7  --   --   PLT 219 238 218 223   Lab Results  Component Value Date   TSH 2.55 10/25/2022   Lab Results  Component Value Date   HGBA1C 5.7 03/18/2022   Lab Results  Component Value Date   CHOL 109 04/17/2020   HDL 38 (L) 04/17/2020   LDLCALC 47 04/17/2020   LDLDIRECT 109.0 04/12/2019   TRIG 160 (H) 04/17/2020   CHOLHDL 2.9 04/17/2020    Significant Diagnostic Results in last 30 days:  No results found.  Assessment/Plan CHF (congestive heart failure), NYHA class I, chronic, diastolic (HCC)  Persistent atrial fibrillation (HCC)  Chronic respiratory failure with hypoxia, on home oxygen therapy (HCC), Chronic  Morbid obesity (HCC)  OSA (obstructive sleep apnea)  Bradycardia  BPH with obstruction/lower urinary tract symptoms Appears euvolemic on exam. Lungs clear. Trace swelling bilateral legs. Continue lasix 40 mg. Rate controlled. Continue eliquis 5mg  BID. Flomax helps with LUTS from BPH. Continue at this time. Patient has and maintains DNR status. NO acute concerns for decline at this time.  Continue supportive care and current medication regimen.   Family/ staff Communication: nursing  Labs/tests ordered:  CMP and CBC q32mo.

## 2023-02-26 ENCOUNTER — Encounter: Payer: Self-pay | Admitting: Student

## 2023-02-26 DIAGNOSIS — I4819 Other persistent atrial fibrillation: Secondary | ICD-10-CM | POA: Insufficient documentation

## 2023-03-12 DIAGNOSIS — R070 Pain in throat: Secondary | ICD-10-CM | POA: Diagnosis not present

## 2023-03-12 DIAGNOSIS — R509 Fever, unspecified: Secondary | ICD-10-CM | POA: Diagnosis not present

## 2023-03-12 DIAGNOSIS — L502 Urticaria due to cold and heat: Secondary | ICD-10-CM | POA: Diagnosis not present

## 2023-03-13 ENCOUNTER — Non-Acute Institutional Stay: Payer: Medicare Other | Admitting: Student

## 2023-03-13 DIAGNOSIS — L219 Seborrheic dermatitis, unspecified: Secondary | ICD-10-CM

## 2023-03-13 DIAGNOSIS — B370 Candidal stomatitis: Secondary | ICD-10-CM | POA: Diagnosis not present

## 2023-03-13 NOTE — Progress Notes (Unsigned)
Location:      Place of Service:    Provider:  Judeth Horn, MD  Patient Care Team: Earnestine Mealing, MD as PCP - General (Family Medicine) Sharon Seller, NP as Nurse Practitioner (Geriatric Medicine)  Extended Emergency Contact Information Primary Emergency Contact: Othelia Pulling Address: [address]          [place], [place] [address] Darden Amber of [place] Home Phone: [phone] Relation: Spouse Secondary Emergency Contact: [place],Glenn Home Phone: [phone] Mobile Phone: [phone] Relation: Son  Code Status: DNR Goals of care: Advanced Directive information    02/01/2023    9:48 AM  Advanced Directives  Does Patient Have a Medical Advance Directive? Yes  Type of Estate agent of [place];Out of facility DNR (pink MOST or yellow form)  Does patient want to make changes to medical advance directive? No - Patient declined  Copy of Healthcare Power of Attorney in Chart? Yes - validated most recent copy scanned in chart (See row information)     No chief complaint on file.   HPI:  Pt is a 89 y.o. male seen today for an acute visit for concern of skin changes. Nursing states patient has lines on his face and thrush.   Patient states he is feeling fine. He is concerned that he has covid or an exposure to covid. Notified patient that his covid tests were negative.    Past Medical History:  Diagnosis Date   Acute respiratory failure with hypoxia (HCC)    Per [facility]   BPH (benign prostatic hyperplasia)    Duodenal mass    Elevated PSA    Fatty (change of) liver, not elsewhere classified    Per [facility]   Gross hematuria    Heart disease, unspecified    Per [facility]   Hyperlipidemia    Hypertension    Hypertrophy (benign) of prostate    asymptomatic on finasteride. will f/up with urologist [name]   Kidney lesion 07/22/2015   pt has appt at kidney specialist 08/05/15 to review CT scan.   Late onset  Alzheimer disease (HCC)    Per [facility]   Obesity    Overweight    Peripheral vascular disease, unspecified (HCC)    Per [facility]   Tubular adenoma of colon    Urinary retention    Past Surgical History:  Procedure Laterality Date   APPENDECTOMY     cataracts     PULMONARY THROMBECTOMY Bilateral 04/21/2021   Procedure: PULMONARY THROMBECTOMY;  Surgeon: Annice Needy, MD;  Location: [facility] INVASIVE CV LAB;  Service: Cardiovascular;  Laterality: Bilateral;   right knee surgery Right 12-17/2017   TOTAL HIP ARTHROPLASTY Right 08/13/2015   Procedure: TOTAL HIP ARTHROPLASTY;  Surgeon: Juanell Fairly, MD;  Location: [facility] ORS;  Service: Orthopedics;  Laterality: Right;   Uvula Surgery  1990    Allergies  Allergen Reactions   No Known Allergies     Outpatient Encounter Medications as of 03/13/2023  Medication Sig   acetaminophen (TYLENOL) 500 MG tablet Take 1,000 mg by mouth every 8 (eight) hours as needed.   apixaban (ELIQUIS) 5 MG TABS tablet TAKE ONE TABLET BY MOUTH TWICE DAILY   bisacodyl (DULCOLAX) 10 MG suppository Place 10 mg rectally daily as needed for moderate constipation.   Calcium Carb-Cholecalciferol (CALCIUM 600+D3) 600-20 MG-MCG TABS Take 1 tablet by mouth at bedtime.   docusate sodium (COLACE) 100 MG capsule Take 2 capsules (200 mg total) by mouth at bedtime.  furosemide (LASIX) 40 MG tablet Take 40 mg by mouth daily.   KETOCONAZOLE, TOPICAL, (NIZORAL) 1 % SHAM Apply topically. Apply to hair and scalp topically every day shift every Wed, Sat for treatment twice weekly with bath   KLOR-CON 20 MEQ packet Take 20 mEq by mouth daily.   Multiple Vitamins-Minerals (MULTIVITAMIN WITH MINERALS) tablet Take 1 tablet by mouth daily. At 1400   OXYGEN 2 lpm as needed to keep sat>90%   polyethylene glycol (MIRALAX / GLYCOLAX) 17 g packet Take 17 g by mouth daily.   tamsulosin (FLOMAX) 0.4 MG CAPS capsule Take 1 capsule (0.4 mg total) by mouth daily.   No facility-administered  encounter medications on file as of 03/13/2023.    Review of Systems  Immunization History  Administered Date(s) Administered   Fluad Quad(high Dose 65+) 03/06/2019, 04/17/2020   Hep A / Hep B 03/17/2016   Hepatitis B, ADULT 03/17/2016   Influenza Split 04/03/2012, 03/12/2013   Influenza,inj,Quad PF,6+ Mos 02/24/2015   Influenza-Unspecified 03/29/2014, 03/08/2017, 03/09/2018, 04/06/2021, 04/06/2022   Moderna Covid-19 Fall Seasonal Vaccine 62yrs & older 09/27/2022   PPD Test 04/23/2021   Pneumococcal Conjugate-13 05/07/2014   Pneumococcal Polysaccharide-23 03/26/2013, 04/12/2019   Td,absorbed, Preservative Free, Adult Use, Lf Unspecified 07/29/2009   Tdap 07/29/2009, 05/31/2016   Zoster, Live 04/26/2013   Pertinent  Health Maintenance Due  Topic Date Due   INFLUENZA VACCINE  01/19/2023      04/22/2021    7:35 AM 04/22/2021   10:00 PM 04/23/2021    7:45 AM 11/08/2022    8:45 AM 11/08/2022   12:24 PM  Fall Risk  Falls in the past year?    0 1  Was there an injury with Fall?     0  Fall Risk Category Calculator     2  (RETIRED) Patient Fall Risk Level High fall risk High fall risk High fall risk    Patient at Risk for Falls Due to     History of fall(s);Impaired balance/gait  Fall risk Follow up     Education provided;Falls prevention discussed   Functional Status Survey:    There were no vitals filed for this visit. There is no height or weight on file to calculate BMI. Physical Exam HENT:     Head:     Comments: Indention of CPAP machine on his face    Mouth/Throat:     Comments: Dry, thick, layer of food particulates present. Non-erythematous Skin:    Comments: Erythematous flaky skin of the scalp and face  Neurological:     Mental Status: He is alert.     Labs reviewed: Recent Labs    03/28/22 1048 07/23/22 1917 09/15/22 0000 10/25/22 0000 01/12/23 0000  NA 140 134* 140 141 140  K 4.4 3.7 4.3 4.2 4.1  CL 99 99 100 103 100  CO2 30 22 33* 28* 33*  GLUCOSE  98 140*  --   --   --   BUN 23 20 15 17 16   CREATININE 1.19 1.00 0.9 1.0 0.8  CALCIUM 8.7* 8.0* 8.5* 8.8 8.7   Recent Labs    03/28/22 1048 07/23/22 1917 09/15/22 0000 01/12/23 0000  AST 24 27 14  13*  ALT 16 14 8* 10  ALKPHOS 93 98 101 98  BILITOT 0.8 1.0  --   --   PROT 6.6 5.9*  --   --   ALBUMIN 3.5 3.0* 3.2* 3.1*   Recent Labs    03/28/22 1048 07/23/22 1917 09/15/22 0000  01/12/23 0000  WBC 8.2 12.4* 8.5 8.2  NEUTROABS 4.8  --  4,913.00 4,756.00  HGB 15.5 15.1 14.0 14.0  HCT 48.3 46.2 42 43  MCV 96.4 96.7  --   --   PLT 219 238 218 223   Lab Results  Component Value Date   TSH 2.55 10/25/2022   Lab Results  Component Value Date   HGBA1C 5.7 03/18/2022   Lab Results  Component Value Date   CHOL 109 04/17/2020   HDL 38 (L) 04/17/2020   LDLCALC 47 04/17/2020   LDLDIRECT 109.0 04/12/2019   TRIG 160 (H) 04/17/2020   CHOLHDL 2.9 04/17/2020    Significant Diagnostic Results in last 30 days:  No results found.  Assessment/Plan Seborrheic dermatitis  Thrush Some concern for thrush, will continue nystatin swish and swallow. Continue nizoral shampoo for seb derm. Indention on face in line with CPAP machine, continue to monitor.   Family/ staff Communication: nursing  Labs/tests ordered:  none

## 2023-03-16 ENCOUNTER — Encounter: Payer: Self-pay | Admitting: Student

## 2023-03-17 DIAGNOSIS — Z23 Encounter for immunization: Secondary | ICD-10-CM | POA: Diagnosis not present

## 2023-04-13 DIAGNOSIS — Z79899 Other long term (current) drug therapy: Secondary | ICD-10-CM | POA: Diagnosis not present

## 2023-04-13 LAB — BASIC METABOLIC PANEL
BUN: 16 (ref 4–21)
CO2: 32 — AB (ref 13–22)
Chloride: 100 (ref 99–108)
Creatinine: 0.9 (ref 0.6–1.3)
Glucose: 82
Potassium: 4 meq/L (ref 3.5–5.1)
Sodium: 142 (ref 137–147)

## 2023-04-13 LAB — COMPREHENSIVE METABOLIC PANEL
Albumin: 3.1 — AB (ref 3.5–5.0)
Calcium: 8.7 (ref 8.7–10.7)
Globulin: 2.2

## 2023-04-13 LAB — CBC AND DIFFERENTIAL
HCT: 44 (ref 41–53)
Hemoglobin: 14.4 (ref 13.5–17.5)
Neutrophils Absolute: 4320
Platelets: 233 10*3/uL (ref 150–400)
WBC: 7.7

## 2023-04-13 LAB — CBC: RBC: 4.55 (ref 3.87–5.11)

## 2023-04-13 LAB — HEPATIC FUNCTION PANEL
ALT: 8 U/L — AB (ref 10–40)
AST: 14 (ref 14–40)
Alkaline Phosphatase: 105 (ref 25–125)
Bilirubin, Total: 0.5

## 2023-05-23 ENCOUNTER — Encounter: Payer: Self-pay | Admitting: Nurse Practitioner

## 2023-05-23 ENCOUNTER — Non-Acute Institutional Stay: Payer: Medicare Other | Admitting: Nurse Practitioner

## 2023-05-23 DIAGNOSIS — Z9981 Dependence on supplemental oxygen: Secondary | ICD-10-CM

## 2023-05-23 DIAGNOSIS — N138 Other obstructive and reflux uropathy: Secondary | ICD-10-CM | POA: Diagnosis not present

## 2023-05-23 DIAGNOSIS — K5904 Chronic idiopathic constipation: Secondary | ICD-10-CM | POA: Diagnosis not present

## 2023-05-23 DIAGNOSIS — I4819 Other persistent atrial fibrillation: Secondary | ICD-10-CM | POA: Diagnosis not present

## 2023-05-23 DIAGNOSIS — Z66 Do not resuscitate: Secondary | ICD-10-CM

## 2023-05-23 DIAGNOSIS — I5032 Chronic diastolic (congestive) heart failure: Secondary | ICD-10-CM | POA: Diagnosis not present

## 2023-05-23 DIAGNOSIS — J9611 Chronic respiratory failure with hypoxia: Secondary | ICD-10-CM | POA: Diagnosis not present

## 2023-05-23 DIAGNOSIS — N401 Enlarged prostate with lower urinary tract symptoms: Secondary | ICD-10-CM

## 2023-05-23 NOTE — Progress Notes (Unsigned)
Location:  Other [facility] Nursing Home Room Number: 205 A Place of Service:  ALF (13)  Christopher [facility] K. Christopher Contes, NP   Patient Care Team: Earnestine Mealing, MD as PCP - General (Family Medicine) Sharon Seller, NP as Nurse Practitioner (Geriatric Medicine)  Extended Emergency Contact Information Primary Emergency Contact: [facility] Address: [address]          [place], [place] [address] Darden Amber of [place] Home Phone: [phone] Relation: Spouse Secondary Emergency Contact: Sabas,Glenn Home Phone: [phone] Mobile Phone: [phone] Relation: Son  Goals of care: Advanced Directive information    05/23/2023   11:55 AM  Advanced Directives  Does Patient Have a Medical Advance Directive? Yes  Type of Estate agent of [place];Living will;Out of facility DNR (pink MOST or yellow form)  Does patient want to make changes to medical advance directive? No - Patient declined  Copy of Healthcare Power of Attorney in Chart? Yes - validated most recent copy scanned in chart (See row information)  Pre-existing out of facility DNR order (yellow form or pink MOST form) Yellow form placed in chart (order not valid for inpatient use)     Chief Complaint  Patient presents with   Medical Management of Chronic Issues    Routine visit. Discuss need for shingrix and covid booster.     HPI:  Pt is a 89 y.o. male seen today for medical management of chronic disease. Pt in memory care at [facility].  He has hx of dementia, bph, a fib, CHF, HTN, OSA, obesty.  He has been doing well. Uses walker for ambulation. No reports of pain at this time.  No significant weight changes.  Staff without concerns.    Past Medical History:  Diagnosis Date   Acute respiratory failure with hypoxia (HCC)    Per [facility]   BPH (benign prostatic hyperplasia)    Duodenal mass    Elevated PSA    Fatty (change of) liver, not elsewhere classified    Per [facility]    Gross hematuria    Heart disease, unspecified    Per [facility]   Hyperlipidemia    Hypertension    Hypertrophy (benign) of prostate    asymptomatic on finasteride. will f/up with urologist [name]   Kidney lesion 07/22/2015   pt has appt at kidney specialist 08/05/15 to review CT scan.   Late onset Alzheimer disease (HCC)    Per [facility]   Obesity    Overweight    Peripheral vascular disease, unspecified (HCC)    Per [facility]   Tubular adenoma of colon    Urinary retention    Past Surgical History:  Procedure Laterality Date   APPENDECTOMY     cataracts     PULMONARY THROMBECTOMY Bilateral 04/21/2021   Procedure: PULMONARY THROMBECTOMY;  Surgeon: Annice Needy, MD;  Location: [facility] INVASIVE CV LAB;  Service: Cardiovascular;  Laterality: Bilateral;   right knee surgery Right 12-17/2017   TOTAL HIP ARTHROPLASTY Right 08/13/2015   Procedure: TOTAL HIP ARTHROPLASTY;  Surgeon: Juanell Fairly, MD;  Location: [facility] ORS;  Service: Orthopedics;  Laterality: Right;   Uvula Surgery  1990    Allergies  Allergen Reactions   No Known Allergies     Outpatient Encounter Medications as of 05/23/2023  Medication Sig   acetaminophen (TYLENOL) 500 MG tablet Take 1,000 mg by mouth every 8 (eight) hours as needed.   apixaban (ELIQUIS) 5 MG TABS tablet TAKE ONE TABLET BY MOUTH TWICE DAILY  bisacodyl (DULCOLAX) 10 MG suppository Place 10 mg rectally daily as needed for moderate constipation.   Calcium Carb-Cholecalciferol (CALCIUM 600+D3) 600-20 MG-MCG TABS Take 1 tablet by mouth at bedtime.   docusate sodium (COLACE) 100 MG capsule Take 2 capsules (200 mg total) by mouth at bedtime.   furosemide (LASIX) 40 MG tablet Take 40 mg by mouth daily.   KETOCONAZOLE, TOPICAL, (NIZORAL) 1 % SHAM Apply topically. Apply to hair and scalp topically every day shift every Wed, Sat for treatment twice weekly with bath   KLOR-CON 20 MEQ packet Take 20 mEq by mouth daily.   Multiple Vitamins-Minerals  (MULTIVITAMIN WITH MINERALS) tablet Take 1 tablet by mouth daily. At 1400   OXYGEN 2 lpm as needed to keep sat>90%   polyethylene glycol (MIRALAX / GLYCOLAX) 17 g packet Take 17 g by mouth daily.   tamsulosin (FLOMAX) 0.4 MG CAPS capsule Take 1 capsule (0.4 mg total) by mouth daily.   famotidine (PEPCID) 20 MG tablet Take 20 mg by mouth 2 (two) times daily. (Patient not taking: Reported on 05/23/2023)   nystatin (MYCOSTATIN) 100000 UNIT/ML suspension Take by mouth. (Patient not taking: Reported on 05/23/2023)   No facility-administered encounter medications on file as of 05/23/2023.    Review of Systems  Unable to perform ROS: Dementia     Immunization History  Administered Date(s) Administered   Fluad Quad(high Dose 65+) 03/06/2019, 04/17/2020   Hep A / Hep B 03/17/2016   Hepatitis B, ADULT 03/17/2016   Influenza Split 04/03/2012, 03/12/2013   Influenza,inj,Quad PF,6+ Mos 02/24/2015   Influenza-Unspecified 03/29/2014, 03/08/2017, 03/09/2018, 04/06/2021, 04/06/2022   Moderna Covid-19 Fall Seasonal Vaccine 58yrs & older 09/27/2022   PPD Test 04/23/2021   Pneumococcal Conjugate-13 05/07/2014   Pneumococcal Polysaccharide-23 03/26/2013, 04/12/2019   Td,absorbed, Preservative Free, Adult Use, Lf Unspecified 07/29/2009   Tdap 07/29/2009, 05/31/2016   Zoster, Live 04/26/2013   Pertinent  Health Maintenance Due  Topic Date Due   INFLUENZA VACCINE  01/19/2023      04/22/2021    7:35 AM 04/22/2021   10:00 PM 04/23/2021    7:45 AM 11/08/2022    8:45 AM 11/08/2022   12:24 PM  Fall Risk  Falls in the past year?    0 1  Was there an injury with Fall?     0  Fall Risk Category Calculator     2  (RETIRED) Patient Fall Risk Level High fall risk High fall risk High fall risk    Patient at Risk for Falls Due to     History of fall(s);Impaired balance/gait  Fall risk Follow up     Education provided;Falls prevention discussed    Vitals:   05/23/23 1153  BP: 136/78  Pulse: 67  Temp: 98.4 F  (36.9 C)  SpO2: 91%  Weight: 238 lb (108 kg)  Height: 5\' 8"  (1.727 m)   Body mass index is 36.19 kg/m. Physical Exam Constitutional:      General: He is not in acute distress.    Appearance: He is well-developed. He is not diaphoretic.  HENT:     Head: Normocephalic and atraumatic.     Right Ear: External ear normal.     Left Ear: External ear normal.     Mouth/Throat:     Pharynx: No oropharyngeal exudate.  Eyes:     Conjunctiva/sclera: Conjunctivae normal.     Pupils: Pupils are equal, round, and reactive to light.  Cardiovascular:     Rate and Rhythm: Normal rate and regular  rhythm.     Heart sounds: Normal heart sounds.  Pulmonary:     Effort: Pulmonary effort is normal.     Breath sounds: Normal breath sounds.  Abdominal:     General: Bowel sounds are normal.     Palpations: Abdomen is soft.  Musculoskeletal:        General: No tenderness.     Cervical back: Normal range of motion and neck supple.     Right lower leg: No edema.     Left lower leg: No edema.  Skin:    General: Skin is warm and dry.  Neurological:     Mental Status: He is alert. Mental status is at baseline.     Gait: Gait abnormal.     Labs reviewed: Recent Labs    07/23/22 1917 09/15/22 0000 10/25/22 0000 01/12/23 0000 04/13/23 0000  NA 134*   < > 141 140 142  K 3.7   < > 4.2 4.1 4.0  CL 99   < > 103 100 100  CO2 22   < > 28* 33* 32*  GLUCOSE 140*  --   --   --   --   BUN 20   < > 17 16 16   CREATININE 1.00   < > 1.0 0.8 0.9  CALCIUM 8.0*   < > 8.8 8.7 8.7   < > = values in this interval not displayed.   Recent Labs    07/23/22 1917 09/15/22 0000 01/12/23 0000 04/13/23 0000  AST 27 14 13* 14  ALT 14 8* 10 8*  ALKPHOS 98 101 98 105  BILITOT 1.0  --   --   --   PROT 5.9*  --   --   --   ALBUMIN 3.0* 3.2* 3.1* 3.1*   Recent Labs    07/23/22 1917 09/15/22 0000 01/12/23 0000 04/13/23 0000  WBC 12.4* 8.5 8.2 7.7  NEUTROABS  --  4,913.00 4,756.00 4,320.00  HGB 15.1 14.0  14.0 14.4  HCT 46.2 42 43 44  MCV 96.7  --   --   --   PLT 238 218 223 233   Lab Results  Component Value Date   TSH 2.55 10/25/2022   Lab Results  Component Value Date   HGBA1C 5.7 03/18/2022   Lab Results  Component Value Date   CHOL 109 04/17/2020   HDL 38 (L) 04/17/2020   LDLCALC 47 04/17/2020   LDLDIRECT 109.0 04/12/2019   TRIG 160 (H) 04/17/2020   CHOLHDL 2.9 04/17/2020    Significant Diagnostic Results in last 30 days:  No results found.  Assessment/Plan 1. Persistent atrial fibrillation (HCC) -rate controlled, continues on eliquis for anticoagulation   2. CHF (congestrive heart failure), NYHA class I, chronic, diastolic (HCC) Euvolemic, on lasix 40 mg daily with potassium supplement   3. Chronic respiratory failure with hypoxia, on home oxygen therapy (HCC) -continue O2 to keep sats over 90%  4. BPH with obstruction/lower urinary tract symptoms -no changes in symptoms reported, continues on flomax   5. Chronic idiopathic constipation Having more loose stools, will decrease miralax to every other day  6. DNR (do not resuscitate) - Do not attempt resuscitation (DNR)   Janene Harvey. Biagio Borg [facility] [phone]

## 2023-06-19 DIAGNOSIS — Z79899 Other long term (current) drug therapy: Secondary | ICD-10-CM | POA: Diagnosis not present

## 2023-06-19 LAB — BASIC METABOLIC PANEL WITH GFR
BUN: 18 (ref 4–21)
CO2: 33 — AB (ref 13–22)
Chloride: 101 (ref 99–108)
Creatinine: 0.9 (ref 0.6–1.3)
Glucose: 91
Potassium: 4.4 meq/L (ref 3.5–5.1)
Sodium: 141 (ref 137–147)

## 2023-06-19 LAB — CBC AND DIFFERENTIAL
HCT: 45 (ref 41–53)
Hemoglobin: 15.1 (ref 13.5–17.5)
Neutrophils Absolute: 5207
Platelets: 249 10*3/uL (ref 150–400)
WBC: 9.2

## 2023-06-19 LAB — HEPATIC FUNCTION PANEL
ALT: 9 U/L — AB (ref 10–40)
AST: 13 — AB (ref 14–40)
Alkaline Phosphatase: 108 (ref 25–125)
Bilirubin, Total: 0.4

## 2023-06-19 LAB — CBC: RBC: 4.69 (ref 3.87–5.11)

## 2023-06-19 LAB — COMPREHENSIVE METABOLIC PANEL WITH GFR
Albumin: 3.3 — AB (ref 3.5–5.0)
Globulin: 2.4
eGFR: 80

## 2023-09-11 ENCOUNTER — Non-Acute Institutional Stay: Payer: Self-pay | Admitting: Student

## 2023-09-11 ENCOUNTER — Encounter: Payer: Self-pay | Admitting: Student

## 2023-09-11 DIAGNOSIS — G309 Alzheimer's disease, unspecified: Secondary | ICD-10-CM

## 2023-09-11 DIAGNOSIS — J9611 Chronic respiratory failure with hypoxia: Secondary | ICD-10-CM | POA: Diagnosis not present

## 2023-09-11 DIAGNOSIS — I5032 Chronic diastolic (congestive) heart failure: Secondary | ICD-10-CM

## 2023-09-11 DIAGNOSIS — F0280 Dementia in other diseases classified elsewhere without behavioral disturbance: Secondary | ICD-10-CM

## 2023-09-11 DIAGNOSIS — N401 Enlarged prostate with lower urinary tract symptoms: Secondary | ICD-10-CM | POA: Diagnosis not present

## 2023-09-11 DIAGNOSIS — N138 Other obstructive and reflux uropathy: Secondary | ICD-10-CM

## 2023-09-11 DIAGNOSIS — K5904 Chronic idiopathic constipation: Secondary | ICD-10-CM

## 2023-09-11 DIAGNOSIS — Z9981 Dependence on supplemental oxygen: Secondary | ICD-10-CM

## 2023-09-11 DIAGNOSIS — I4819 Other persistent atrial fibrillation: Secondary | ICD-10-CM

## 2023-09-11 DIAGNOSIS — I10 Essential (primary) hypertension: Secondary | ICD-10-CM

## 2023-09-11 NOTE — Progress Notes (Signed)
 Location:  Other   Place of Service:  ALF (13) Provider:  Judeth Horn, MD  Patient Care Team: Earnestine Mealing, MD as PCP - General (Family Medicine) Sharon Seller, NP as Nurse Practitioner (Geriatric Medicine)  Extended Emergency Contact Information Primary Emergency Contact: Othelia Pulling Address: [address]          [place], [place] [address] D[place] of [place] Home Phone: [phone] Relation: Spouse Secondary Emergency Contact: Guest,Glenn Home Phone: [phone] Mobile Phone: [phone] Relation: Son  Code Status:  DNR Goals of care: Advanced Directive information    05/23/2023   11:55 AM  Advanced Directives  Does Patient Have a Medical Advance Directive? Yes  Type of Estate agent of [place];Living will;Out of facility DNR (pink MOST or yellow form)  Does patient want to make changes to medical advance directive? No - Patient declined  Copy of Healthcare Power of Attorney in Chart? Yes - validated most recent copy scanned in chart (See row information)  Pre-existing out of facility DNR order (yellow form or pink MOST form) Yellow form placed in chart (order not valid for inpatient use)     Chief Complaint  Patient presents with   Medical Management of Chronic Issues    HPI:  Pt is a 89 y.o. male seen today for a routine visit.  Discussed the use of AI scribe software for clinical note transcription with the patient, who gave verbal consent to proceed.  History of Present Illness         History of Present Illness The patient, with chronic heart failure, presents for routine follow-up. He is accompanied by his wife, Carney Bern.  He has chronic heart failure, specifically NYHA class I chronic diastolic failure. His heart rate is consistently low, with recent measurements ranging from 53 to 59 beats per minute. He has not returned to cardiology for follow-up. No chest pain or shortness of breath.  He has chronic  respiratory failure with hypoxia and uses home oxygen as needed, primarily during sleep, at two liters. No shortness of breath.  He is on Eliquis 5 mg twice daily for atrial fibrillation, with no signs of bleeding. His kidney function supports this dosing, with a creatinine level of 0.9 as of October 2024.  He experiences constipation and takes Colace, bisacodyl, and MiraLAX every other day. He has regular stools but also experiences fecal incontinence.  He takes Namenda for dementia. Cognition is stable.   He has obstructive sleep apnea. For which he wears a CPAP nightly with O2 supplement.   He has benign prostatic hyperplasia with lower urinary tract symptoms, for which he uses Flomax.  He has a history of aortic atherosclerosis. No symptoms at this time.   He has a history of bilateral pulmonary embolism in 2022 and continues anticoagulation therapy for both the history of blood clots and atrial fibrillation.  Past Medical History:  Diagnosis Date   Acute respiratory failure with hypoxia (HCC)    Per [facility]   BPH (benign prostatic hyperplasia)    Duodenal mass    Elevated PSA    Fatty (change of) liver, not elsewhere classified    Per [facility]   Gross hematuria    Heart disease, unspecified    Per [facility]   Hyperlipidemia    Hypertension    Hypertrophy (benign) of prostate    asymptomatic on finasteride. will f/up with urologist [name]   Kidney lesion 07/22/2015   pt has appt at kidney specialist 08/05/15  to review CT scan.   Late onset Alzheimer disease (HCC)    Per [facility]   Obesity    Overweight    Peripheral vascular disease, unspecified (HCC)    Per [facility]   Tubular adenoma of colon    Urinary retention    Past Surgical History:  Procedure Laterality Date   APPENDECTOMY     cataracts     PULMONARY THROMBECTOMY Bilateral 04/21/2021   Procedure: PULMONARY THROMBECTOMY;  Surgeon: Annice Needy, MD;  Location: [facility] INVASIVE CV LAB;  Service:  Cardiovascular;  Laterality: Bilateral;   right knee surgery Right 12-17/2017   TOTAL HIP ARTHROPLASTY Right 08/13/2015   Procedure: TOTAL HIP ARTHROPLASTY;  Surgeon: Juanell Fairly, MD;  Location: [facility] ORS;  Service: Orthopedics;  Laterality: Right;   Uvula Surgery  1990    Allergies  Allergen Reactions   No Known Allergies     Outpatient Encounter Medications as of 09/11/2023  Medication Sig   memantine (NAMENDA) 10 MG tablet Take 1 tablet by mouth 2 (two) times daily.   acetaminophen (TYLENOL) 500 MG tablet Take 1,000 mg by mouth every 8 (eight) hours as needed.   apixaban (ELIQUIS) 5 MG TABS tablet TAKE ONE TABLET BY MOUTH TWICE DAILY   bisacodyl (DULCOLAX) 10 MG suppository Place 10 mg rectally daily as needed for moderate constipation.   Calcium Carb-Cholecalciferol (CALCIUM 600+D3) 600-20 MG-MCG TABS Take 1 tablet by mouth at bedtime.   docusate sodium (COLACE) 100 MG capsule Take 2 capsules (200 mg total) by mouth at bedtime.   furosemide (LASIX) 40 MG tablet Take 40 mg by mouth daily.   KETOCONAZOLE, TOPICAL, (NIZORAL) 1 % SHAM Apply topically. Apply to hair and scalp topically every day shift every Wed, Sat for treatment twice weekly with bath   KLOR-CON 20 MEQ packet Take 20 mEq by mouth daily.   Multiple Vitamins-Minerals (MULTIVITAMIN WITH MINERALS) tablet Take 1 tablet by mouth daily. At 1400   OXYGEN 2 lpm as needed to keep sat>90%   polyethylene glycol (MIRALAX / GLYCOLAX) 17 g packet Take 17 g by mouth daily.   tamsulosin (FLOMAX) 0.4 MG CAPS capsule Take 1 capsule (0.4 mg total) by mouth daily.   No facility-administered encounter medications on file as of 09/11/2023.    Review of Systems  Immunization History  Administered Date(s) Administered   Fluad Quad(high Dose 65+) 03/06/2019, 04/17/2020   Hep A / Hep B 03/17/2016   Hepatitis B, ADULT 03/17/2016   Influenza Split 04/03/2012, 03/12/2013   Influenza,inj,Quad PF,6+ Mos 02/24/2015   Influenza-Unspecified  03/29/2014, 03/08/2017, 03/09/2018, 04/06/2021, 04/06/2022   Moderna Covid-19 Fall Seasonal Vaccine 87yrs & older 09/27/2022   PPD Test 04/23/2021   Pneumococcal Conjugate-13 05/07/2014   Pneumococcal Polysaccharide-23 03/26/2013, 04/12/2019   Td,absorbed, Preservative Free, Adult Use, Lf Unspecified 07/29/2009   Tdap 07/29/2009, 05/31/2016   Zoster, Live 04/26/2013   Pertinent  Health Maintenance Due  Topic Date Due   INFLUENZA VACCINE  01/19/2023      04/22/2021    7:35 AM 04/22/2021   10:00 PM 04/23/2021    7:45 AM 11/08/2022    8:45 AM 11/08/2022   12:24 PM  Fall Risk  Falls in the past year?    0 1  Was there an injury with Fall?     0  Fall Risk Category Calculator     2  (RETIRED) Patient Fall Risk Level High fall risk High fall risk High fall risk    Patient at Risk for Falls  Due to     History of fall(s);Impaired balance/gait  Fall risk Follow up     Education provided;Falls prevention discussed   Functional Status Survey:    Vitals:   09/11/23 0942  BP: 137/84  Resp: 19  Temp: 98.7 F (37.1 C)  SpO2: 95%  Weight: 236 lb 9.6 oz (107.3 kg)   Body mass index is 35.97 kg/m. Physical Exam Constitutional:      Appearance: He is obese.  Cardiovascular:     Rate and Rhythm: Bradycardia present.     Pulses: Normal pulses.     Heart sounds: Normal heart sounds.  Pulmonary:     Effort: Pulmonary effort is normal.  Abdominal:     General: Abdomen is flat. Bowel sounds are normal.     Palpations: Abdomen is soft.  Musculoskeletal:        General: No swelling or tenderness.  Skin:    General: Skin is warm and dry.  Neurological:     Mental Status: He is alert. Mental status is at baseline.     Gait: Gait normal.  Psychiatric:        Mood and Affect: Mood normal.     Labs reviewed: Recent Labs    10/25/22 0000 01/12/23 0000 04/13/23 0000  NA 141 140 142  K 4.2 4.1 4.0  CL 103 100 100  CO2 28* 33* 32*  BUN 17 16 16   CREATININE 1.0 0.8 0.9  CALCIUM  8.8 8.7 8.7   Recent Labs    09/15/22 0000 01/12/23 0000 04/13/23 0000  AST 14 13* 14  ALT 8* 10 8*  ALKPHOS 101 98 105  ALBUMIN 3.2* 3.1* 3.1*   Recent Labs    09/15/22 0000 01/12/23 0000 04/13/23 0000  WBC 8.5 8.2 7.7  NEUTROABS 4,913.00 4,756.00 4,320.00  HGB 14.0 14.0 14.4  HCT 42 43 44  PLT 218 223 233   Lab Results  Component Value Date   TSH 2.55 10/25/2022   Lab Results  Component Value Date   HGBA1C 5.7 03/18/2022   Lab Results  Component Value Date   CHOL 109 04/17/2020   HDL 38 (L) 04/17/2020   LDLCALC 47 04/17/2020   LDLDIRECT 109.0 04/12/2019   TRIG 160 (H) 04/17/2020   CHOLHDL 2.9 04/17/2020    Significant Diagnostic Results in last 30 days:  No results found.  Assessment/Plan Chronic Heart Failure (CHF) NYHA Class I Chronic heart failure with NYHA Class I symptoms, indicating mild symptoms with ordinary physical activity. Heart rate is consistently low, with no current symptoms of chest pain or dyspnea. No recent cardiology follow-up or new cardiology notes available. - Continue current heart failure management regimen - Encourage cardiology follow-up if symptoms worsen  Chronic Respiratory Failure with Hypoxia Chronic respiratory failure with hypoxia, using home oxygen primarily during sleep at 2 liters. No current dyspnea reported. - Continue home oxygen therapy as needed, primarily during sleep  Atrial Fibrillation On Eliquis for atrial fibrillation with no signs of bleeding. Kidney function supports current dosing of Eliquis at 5 mg BID, with a creatinine level of 0.9 as of October 2024. - Continue Eliquis 5 mg BID - Monitor for signs of bleeding - Check kidney function regularly to ensure appropriate dosing  Hx of Bilateral Pulmonary Embolism Bilateral pulmonary embolism in 2022, on continued anticoagulation therapy with Eliquis for both history of blood clots and atrial fibrillation. - Continue anticoagulation therapy with  Eliquis  Dementia On Namenda for dementia management. No changes in cognitive function  or medication adjustments. - Continue Namenda 10 mg for dementia management  Constipation and Fecal Incontinence Constipation managed with Colace, Purine, bisacodyl, and MiraLAX. Regular stools reported, but mention of fecal incontinence. - Continue current bowel regimen with Colace, Purine, bisacodyl, and MiraLAX  Obstructive Sleep Apnea (OSA) Obstructive sleep apnea. Continue CPAP  Benign Prostatic Hyperplasia (BPH) with Lower Urinary Tract Symptoms (LUTS) Uses Flomax for BPH with LUTS. No discussion of changes in urinary symptoms or medication adjustments. - Continue Flomax for BPH management   Family/ staff Communication: nursing  Labs/tests ordered:  CBC,CMP

## 2023-09-29 ENCOUNTER — Encounter: Payer: Self-pay | Admitting: Student

## 2023-09-29 NOTE — Telephone Encounter (Signed)
Message routed to PCP Beamer, Victoria, MD  

## 2023-10-17 ENCOUNTER — Encounter: Payer: Self-pay | Admitting: Student

## 2023-10-17 NOTE — Telephone Encounter (Signed)
Message routed to PCP Beamer, Victoria, MD  

## 2023-11-01 LAB — HEPATIC FUNCTION PANEL
ALT: 10 U/L (ref 10–40)
AST: 15 (ref 14–40)
Alkaline Phosphatase: 106 (ref 25–125)
Bilirubin, Total: 0.5

## 2023-11-01 LAB — COMPREHENSIVE METABOLIC PANEL WITH GFR
Albumin: 3.4 — AB (ref 3.5–5.0)
Calcium: 8.8 (ref 8.7–10.7)
Globulin: 2.2
eGFR: 79

## 2023-11-01 LAB — CBC: RBC: 4.77 (ref 3.87–5.11)

## 2023-11-01 LAB — CBC AND DIFFERENTIAL
HCT: 46 (ref 41–53)
Hemoglobin: 14.9 (ref 13.5–17.5)
Neutrophils Absolute: 4527
Platelets: 235 10*3/uL (ref 150–400)
WBC: 7.9

## 2023-11-01 LAB — BASIC METABOLIC PANEL WITH GFR
BUN: 20 (ref 4–21)
CO2: 34 — AB (ref 13–22)
Chloride: 100 (ref 99–108)
Creatinine: 0.9 (ref 0.6–1.3)
Glucose: 88
Potassium: 4 meq/L (ref 3.5–5.1)
Sodium: 141 (ref 137–147)

## 2023-11-14 ENCOUNTER — Encounter: Payer: Self-pay | Admitting: Nurse Practitioner

## 2023-11-14 ENCOUNTER — Non-Acute Institutional Stay (SKILLED_NURSING_FACILITY): Payer: Self-pay | Admitting: Nurse Practitioner

## 2023-11-14 DIAGNOSIS — Z0000 Encounter for general adult medical examination without abnormal findings: Secondary | ICD-10-CM | POA: Diagnosis not present

## 2023-11-14 NOTE — Patient Instructions (Signed)
  Mr. Christopher Jenkins , Thank you for taking time to come for your Medicare Wellness Visit. I appreciate your ongoing commitment to your health goals. Please review the following plan we discussed and let me know if I can assist you in the future.     This is a list of the screening recommended for you and due dates:  Health Maintenance  Topic Date Due   COVID-19 Vaccine (3 - Mixed Product risk series) 10/27/2023   Zoster (Shingles) Vaccine (1 of 2) 11/13/2024*   Flu Shot  01/19/2024   Medicare Annual Wellness Visit  11/13/2024   DTaP/Tdap/Td vaccine (4 - Td or Tdap) 05/31/2026   Pneumonia Vaccine  Completed   HPV Vaccine  Aged Out   Meningitis B Vaccine  Aged Out  *Topic was postponed. The date shown is not the original due date.

## 2023-11-14 NOTE — Progress Notes (Signed)
 Subjective:   Christopher Jenkins is a 89 y.o. male who presents for Medicare Annual/Subsequent preventive examination.  Visit Complete: In person   Cardiac Risk Factors include: advanced age (>42men, >59 women);obesity (BMI >30kg/m2);sedentary lifestyle;hypertension;male gender     Objective:    Today's Vitals   11/14/23 0853 11/14/23 0925  BP: (!) 146/67 138/67  Pulse: 63   Resp: 19   Temp: (!) 97.5 F (36.4 C)   SpO2: 90%   Weight: 237 lb (107.5 kg)   Height: 5\' 8"  (1.727 m)    Body mass index is 36.04 kg/m.     11/14/2023    9:24 AM 05/23/2023   11:55 AM 02/01/2023    9:48 AM 01/30/2023    9:06 AM 11/08/2022   11:05 AM 10/25/2022    9:19 AM 07/25/2022    8:57 AM  Advanced Directives  Does Patient Have a Medical Advance Directive? Yes Yes Yes Yes Yes Yes Yes  Type of Advance Directive Out of facility DNR (pink MOST or yellow form) Healthcare Power of [place];Living will;Out of facility DNR (pink MOST or yellow form) Healthcare Power of [place];Out of facility DNR (pink MOST or yellow form) Healthcare Power of [place];Living will;Out of facility DNR (pink MOST or yellow form) Out of facility DNR (pink MOST or yellow form) Out of facility DNR (pink MOST or yellow form) Out of facility DNR (pink MOST or yellow form)  Does patient want to make changes to medical advance directive? No - Patient declined No - Patient declined No - Patient declined No - Patient declined No - Patient declined No - Patient declined No - Patient declined  Copy of Healthcare Power of Attorney in Chart?  Yes - validated most recent copy scanned in chart (See row information) Yes - validated most recent copy scanned in chart (See row information) Yes - validated most recent copy scanned in chart (See row information)     Pre-existing out of facility DNR order (yellow form or pink MOST form)  Yellow form placed in chart (order not valid for inpatient use)  Yellow form placed in chart (order not valid for  inpatient use)       Current Medications (verified) Outpatient Encounter Medications as of 11/14/2023  Medication Sig   acetaminophen  (TYLENOL ) 500 MG tablet Take 1,000 mg by mouth every 8 (eight) hours as needed.   apixaban  (ELIQUIS ) 5 MG TABS tablet TAKE ONE TABLET BY MOUTH TWICE DAILY   bisacodyl  (DULCOLAX) 10 MG suppository Place 10 mg rectally daily as needed for moderate constipation.   Calcium  Carb-Cholecalciferol  (CALCIUM  600+D3) 600-20 MG-MCG TABS Take 1 tablet by mouth at bedtime.   docusate sodium  (COLACE) 100 MG capsule Take 2 capsules (200 mg total) by mouth at bedtime.   furosemide  (LASIX ) 40 MG tablet Take 40 mg by mouth daily.   KETOCONAZOLE, TOPICAL, (NIZORAL) 1 % SHAM Apply topically. Apply to hair and scalp topically every day shift every Wed, Sat for treatment twice weekly with bath   KLOR-CON  20 MEQ packet Take 20 mEq by mouth daily.   Multiple Vitamins-Minerals (MULTIVITAMIN WITH MINERALS) tablet Take 1 tablet by mouth daily. At 1400   OXYGEN  2 lpm as needed to keep sat>90%   polyethylene glycol (MIRALAX  / GLYCOLAX ) 17 g packet Take 17 g by mouth daily.   tamsulosin  (FLOMAX ) 0.4 MG CAPS capsule Take 1 capsule (0.4 mg total) by mouth daily.   memantine  (NAMENDA ) 10 MG tablet Take 1 tablet by mouth 2 (two) times daily. (Patient  not taking: Reported on 11/14/2023)   No facility-administered encounter medications on file as of 11/14/2023.    Allergies (verified) No known allergies   History: Past Medical History:  Diagnosis Date   Acute respiratory failure with hypoxia (HCC)    Per [facility]   BPH (benign prostatic hyperplasia)    Duodenal mass    Elevated PSA    Fatty (change of) liver, not elsewhere classified    Per [facility]   Gross hematuria    Heart disease, unspecified    Per [facility]   Hyperlipidemia    Hypertension    Hypertrophy (benign) of prostate    asymptomatic on finasteride . will f/up with urologist Etter Hermann   Kidney lesion 07/22/2015    pt has appt at kidney specialist 08/05/15 to review CT scan.   Late onset Alzheimer disease (HCC)    Per [facility]   Obesity    Overweight    Peripheral vascular disease, unspecified (HCC)    Per [facility]   Tubular adenoma of colon    Urinary retention    Past Surgical History:  Procedure Laterality Date   APPENDECTOMY     cataracts     PULMONARY THROMBECTOMY Bilateral 04/21/2021   Procedure: PULMONARY THROMBECTOMY;  Surgeon: Celso College, MD;  Location: [facility] INVASIVE CV LAB;  Service: Cardiovascular;  Laterality: Bilateral;   right knee surgery Right 12-17/2017   TOTAL HIP ARTHROPLASTY Right 08/13/2015   Procedure: TOTAL HIP ARTHROPLASTY;  Surgeon: Rande Bushy, MD;  Location: [facility] ORS;  Service: Orthopedics;  Laterality: Right;   Uvula Surgery  1990   Family History  Problem Relation Age of Onset   Heart disease Mother    Stroke Mother    Heart disease Father    Kidney disease Neg Hx    Prostate cancer Neg Hx    Kidney cancer Neg Hx    Bladder Cancer Neg Hx    Social History   Socioeconomic History   Marital status: Married    Spouse name: Not on file   Number of children: 3   Years of education: Not on file   Highest education level: Not on file  Occupational History   Occupation: Retired  Tobacco Use   Smoking status: Never   Smokeless tobacco: Never  Vaping Use   Vaping status: Not on file  Substance and Sexual Activity   Alcohol use: No    Comment: Occasional glass of wine 3-4 times a year.   Drug use: No   Sexual activity: Never  Other Topics Concern   Not on file  Social History Narrative   2 sons, 1 daughter      Living will?   Sons/daughter would be medical decision makers   Has DNR   No tube feeds if cognitively unaware   Social Drivers of Health   Financial Resource Strain: Low Risk  (02/23/2018)   Overall Financial Resource Strain (CARDIA)    Difficulty of Paying Living Expenses: Not hard at all  Food Insecurity: No Food Insecurity  (02/23/2018)   Hunger Vital Sign    Worried About Running Out of Food in the Last Year: Never true    Ran Out of Food in the Last Year: Never true  Transportation Needs: No Transportation Needs (02/23/2018)   PRAPARE - Administrator, Civil Service (Medical): No    Lack of Transportation (Non-Medical): No  Physical Activity: Insufficiently Active (02/23/2018)   Exercise Vital Sign    Days of  Exercise per Week: 5 days    Minutes of Exercise per Session: 20 min  Stress: No Stress Concern Present (02/23/2018)   [employer] of Occupational Health - Occupational Stress Questionnaire    Feeling of Stress : Not at all  Social Connections: Not on file    Tobacco Counseling Counseling given: Not Answered   Clinical Intake:  Pre-visit preparation completed: Yes  Pain : No/denies pain        How often do you need to have someone help you when you read instructions, pamphlets, or other written materials from your doctor or pharmacy?: 5 - Always         Activities of Daily Living    11/14/2023    9:02 AM  In your present state of health, do you have any difficulty performing the following activities:  Hearing? 0  Vision? 0  Difficulty concentrating or making decisions? 1  Walking or climbing stairs? 1  Dressing or bathing? 1  Doing errands, shopping? 1  Preparing Food and eating ? Y  Using the Toilet? Y  In the past six months, have you accidently leaked urine? Y  Do you have problems with loss of bowel control? N  Managing your Medications? Y  Managing your Finances? Y  Housekeeping or managing your Housekeeping? Y    Patient Care Team: Valrie Gehrig, MD as PCP - General (Family Medicine) Verma Gobble, NP as Nurse Practitioner (Geriatric Medicine)  Indicate any recent Medical Services you may have received from other than [facility] providers in the past year (date may be approximate).     Assessment:   This is a routine wellness examination for  Christopher Jenkins.  Hearing/Vision screen No results found.   Goals Addressed   None   Depression Screen    11/14/2023    9:03 AM 11/08/2022    8:45 AM 04/26/2019    4:43 PM 02/23/2018    1:15 PM 02/22/2017    1:21 PM 10/06/2016    9:04 AM 09/02/2015    2:53 PM  PHQ 2/9 Scores  PHQ - 2 Score   0 0 0 0 0  PHQ- 9 Score     0    Exception Documentation Other- indicate reason in comment box Other- indicate reason in comment box       Not completed dementia dementia         Fall Risk    11/14/2023    9:03 AM 11/08/2022   12:24 PM 11/08/2022    8:45 AM 04/26/2019    4:43 PM 04/12/2019    2:08 PM  Fall Risk   Falls in the past year? 0 1 0 0 0  Number falls in past yr:  1     Injury with Fall?  0     Risk for fall due to :  History of fall(s);Impaired balance/gait     Follow up  Education provided;Falls prevention discussed  Falls evaluation completed Falls evaluation completed    MEDICARE RISK AT HOME: Medicare Risk at Home Any stairs in or around the home?: No Home free of loose throw rugs in walkways, pet beds, electrical cords, etc?: Yes Adequate lighting in your home to reduce risk of falls?: Yes Life alert?: No Use of a cane, walker or w/c?: Yes Grab bars in the bathroom?: Yes Shower chair or bench in shower?: Yes Elevated toilet seat or a handicapped toilet?: Yes  TIMED UP AND GO:  Was the test performed?  No    Cognitive  Function:    02/23/2018    1:23 PM 02/22/2017    1:58 PM 09/02/2015    2:58 PM  MMSE - Mini Mental State Exam  Orientation to time 4 5 5   Orientation to time comments Wrong time shown on clock face. 10:55 drawn; needed 11:10.     Orientation to Place 5 5 5   Registration 3 3 3   Attention/ Calculation 5 5 5   Recall 1 3 3   Language- name 2 objects 2 2 2   Language- repeat 1 1 1   Language- follow 3 step command 3 3 3   Language- read & follow direction 1 1 1   Write a sentence 1 1 1   Copy design 1 1 1   Total score 27 30 30         Immunizations Immunization  History  Administered Date(s) Administered   Fluad Quad(high Dose 65+) 03/06/2019, 04/17/2020   Hep A / Hep B 03/17/2016   Hepatitis B, ADULT 03/17/2016   Influenza Split 04/03/2012, 03/12/2013   Influenza,inj,Quad PF,6+ Mos 02/24/2015   Influenza-Unspecified 03/29/2014, 03/08/2017, 03/09/2018, 04/06/2021, 04/06/2022   Moderna Covid-19 Fall Seasonal Vaccine 3yrs & older 09/27/2022   PPD Test 04/23/2021   Pneumococcal Conjugate-13 05/07/2014   Pneumococcal Polysaccharide-23 03/26/2013, 04/12/2019   Td,absorbed, Preservative Free, Adult Use, Lf Unspecified 07/29/2009   Tdap 07/29/2009, 05/31/2016   Unspecified SARS-COV-2 Vaccination 09/29/2023   Zoster, Live 04/26/2013    TDAP status: Up to date  Flu Vaccine status: Up to date  Pneumococcal vaccine status: Up to date  Covid-19 vaccine status: Completed vaccines  Qualifies for Shingles Vaccine? Yes   Zostavax completed No   Shingrix Completed?: No.    Education has been provided regarding the importance of this vaccine. Patient has been advised to call insurance company to determine out of pocket expense if they have not yet received this vaccine. Advised may also receive vaccine at local pharmacy or Health Dept. Verbalized acceptance and understanding.  Daughter declines shingles vaccine  Screening Tests Health Maintenance  Topic Date Due   Zoster Vaccines- Shingrix (1 of 2) 06/07/1946   COVID-19 Vaccine (3 - Mixed Product risk series) 10/27/2023   INFLUENZA VACCINE  01/19/2024   Medicare Annual Wellness (AWV)  11/13/2024   DTaP/Tdap/Td (4 - Td or Tdap) 05/31/2026   Pneumonia Vaccine 21+ Years old  Completed   HPV VACCINES  Aged Out   Meningococcal B Vaccine  Aged Out    Health Maintenance  Health Maintenance Due  Topic Date Due   Zoster Vaccines- Shingrix (1 of 2) 06/07/1946   COVID-19 Vaccine (3 - Mixed Product risk series) 10/27/2023    Colorectal cancer screening: No longer required.   Lung Cancer Screening:  (Low Dose CT Chest recommended if Age 61-80 years, 20 pack-year currently smoking OR have quit w/in 15years.) does not qualify.   Lung Cancer Screening Referral: na  Additional Screening:  Hepatitis C Screening: does not qualify; Completed na  Vision Screening: Recommended annual ophthalmology exams for early detection of glaucoma and other disorders of the eye. Is the patient up to date with their annual eye exam?  No  Who is the provider or what is the name of the office in which the patient attends annual eye exams? none If pt is not established with a provider, would they like to be referred to a provider to establish care? Yes .   Dental Screening: Recommended annual dental exams for proper oral hygiene  Community Resource Referral / Chronic Care Management: CRR required this visit?  No   CCM required this visit?  No     Plan:     I have personally reviewed and noted the following in the patient's chart:   Medical and social history Use of alcohol, tobacco or illicit drugs  Current medications and supplements including opioid prescriptions. Patient is not currently taking opioid prescriptions. Functional ability and status Nutritional status Physical activity Advanced directives List of other physicians Hospitalizations, surgeries, and ER visits in previous 12 months Vitals Screenings to include cognitive, depression, and falls Referrals and appointments  In addition, I have reviewed and discussed with patient certain preventive protocols, quality metrics, and best practice recommendations. A written personalized care plan for preventive services as well as general preventive health recommendations were provided to patient.     Verma Gobble, NP   11/14/2023

## 2023-12-05 ENCOUNTER — Non-Acute Institutional Stay: Payer: Self-pay | Admitting: Nurse Practitioner

## 2023-12-05 ENCOUNTER — Encounter: Payer: Self-pay | Admitting: Nurse Practitioner

## 2023-12-05 DIAGNOSIS — I10 Essential (primary) hypertension: Secondary | ICD-10-CM

## 2023-12-05 DIAGNOSIS — Z9981 Dependence on supplemental oxygen: Secondary | ICD-10-CM

## 2023-12-05 DIAGNOSIS — F0280 Dementia in other diseases classified elsewhere without behavioral disturbance: Secondary | ICD-10-CM

## 2023-12-05 DIAGNOSIS — J9611 Chronic respiratory failure with hypoxia: Secondary | ICD-10-CM

## 2023-12-05 DIAGNOSIS — N138 Other obstructive and reflux uropathy: Secondary | ICD-10-CM

## 2023-12-05 DIAGNOSIS — I700 Atherosclerosis of aorta: Secondary | ICD-10-CM | POA: Diagnosis not present

## 2023-12-05 DIAGNOSIS — I5032 Chronic diastolic (congestive) heart failure: Secondary | ICD-10-CM

## 2023-12-05 DIAGNOSIS — K5904 Chronic idiopathic constipation: Secondary | ICD-10-CM

## 2023-12-05 DIAGNOSIS — G309 Alzheimer's disease, unspecified: Secondary | ICD-10-CM

## 2023-12-05 DIAGNOSIS — N401 Enlarged prostate with lower urinary tract symptoms: Secondary | ICD-10-CM

## 2023-12-05 DIAGNOSIS — I4819 Other persistent atrial fibrillation: Secondary | ICD-10-CM | POA: Diagnosis not present

## 2023-12-05 DIAGNOSIS — G4733 Obstructive sleep apnea (adult) (pediatric): Secondary | ICD-10-CM

## 2023-12-05 NOTE — Assessment & Plan Note (Signed)
 Stable, continues on O2 at 2L PRN

## 2023-12-05 NOTE — Assessment & Plan Note (Signed)
 Continues on CPAP.

## 2023-12-05 NOTE — Assessment & Plan Note (Signed)
 Euvolemic at this time, continues on lasix  and potassium

## 2023-12-05 NOTE — Assessment & Plan Note (Signed)
 Urinary symptoms stable, no longer followed by urology Continues on flomax  daily

## 2023-12-05 NOTE — Assessment & Plan Note (Signed)
 Blood pressure well controlled, goal bp <140/90 Continue current medications and dietary modifications follow metabolic panel

## 2023-12-05 NOTE — Progress Notes (Signed)
 Location:  Other [facility].  Nursing Home Room Number: [facility] [address] Place of Service:  ALF [phone]) [facility], NP  PCP: Valrie Gehrig, MD  Patient Care Team: Valrie Gehrig, MD as PCP - General (Family Medicine) Verma Gobble, NP as Nurse Practitioner (Geriatric Medicine)  Extended Emergency Contact Information Primary Emergency Contact: Perrin Brakeman Address: [address]          [place], [place] [address] [place] Home Phone: [phone] Relation: Spouse Secondary Emergency Contact: Hardrick,Glenn Home Phone: [phone] Mobile Phone: [phone] Relation: Son  Goals of care: Advanced Directive information    11/14/2023    9:24 AM  Advanced Directives  Does Patient Have a Medical Advance Directive? Yes  Type of Advance Directive Out of facility DNR (pink MOST or yellow form)  Does patient want to make changes to medical advance directive? No - Patient declined     Chief Complaint  Patient presents with   Medical Management of Chronic Issues    Medical Management of Chronic Issues.     HPI:  Pt is a 89 y.o. male seen today for medical management of chronic disease. Pt with hx of chf, a fib, hx of bilateral PE, dementia, OSA, constipation, BPH and chronic O2 Pt reports he is doing well. Has no complaints today of pain or discomfort.  No worsening LE edema, chest pains, or shortness of breath Eats well- staff reports he will eat the food off his wife's plate as well  No changes in bowels, no changes in urinary frequency of flow.     Past Medical History:  Diagnosis Date   Acute respiratory failure with hypoxia (HCC)    Per [facility]   BPH (benign prostatic hyperplasia)    Duodenal mass    Elevated PSA    Fatty (change of) liver, not elsewhere classified    Per [facility]   Gross hematuria    Heart disease, unspecified    Per [facility]   Hyperlipidemia    Hypertension    Hypertrophy (benign) of prostate     asymptomatic on finasteride . will f/up with urologist Etter Hermann   Kidney lesion 07/22/2015   pt has appt at kidney specialist 08/05/15 to review CT scan.   Late onset Alzheimer disease (HCC)    Per [facility]   Obesity    Overweight    Peripheral vascular disease, unspecified (HCC)    Per [facility]   Tubular adenoma of colon    Urinary retention    Past Surgical History:  Procedure Laterality Date   APPENDECTOMY     cataracts     PULMONARY THROMBECTOMY Bilateral 04/21/2021   Procedure: PULMONARY THROMBECTOMY;  Surgeon: Celso College, MD;  Location: [facility] INVASIVE CV LAB;  Service: Cardiovascular;  Laterality: Bilateral;   right knee surgery Right 12-17/2017   TOTAL HIP ARTHROPLASTY Right 08/13/2015   Procedure: TOTAL HIP ARTHROPLASTY;  Surgeon: Rande Bushy, MD;  Location: [facility] ORS;  Service: Orthopedics;  Laterality: Right;   Uvula Surgery  1990    Allergies  Allergen Reactions   No Known Allergies     Outpatient Encounter Medications as of 12/05/2023  Medication Sig   acetaminophen  (TYLENOL ) 500 MG tablet Take 1,000 mg by mouth every 8 (eight) hours as needed.   apixaban  (ELIQUIS ) 5 MG TABS tablet TAKE ONE TABLET BY MOUTH TWICE DAILY   bisacodyl  (DULCOLAX) 10 MG suppository Place 10 mg rectally daily as needed for moderate constipation.   Calcium  Carb-Cholecalciferol  (CALCIUM  600+D3)  600-20 MG-MCG TABS Take 1 tablet by mouth at bedtime.   docusate sodium  (COLACE) 100 MG capsule Take 2 capsules (200 mg total) by mouth at bedtime.   furosemide  (LASIX ) 40 MG tablet Take 40 mg by mouth daily.   KETOCONAZOLE, TOPICAL, (NIZORAL) 1 % SHAM Apply topically. Apply to hair and scalp topically every day shift every Wed, Sat for treatment twice weekly with bath   KLOR-CON  20 MEQ packet Take 20 mEq by mouth daily.   Multiple Vitamins-Minerals (MULTIVITAMIN WITH MINERALS) tablet Take 1 tablet by mouth daily. At 1400   OXYGEN  2 lpm as needed to keep sat>90%   polyethylene glycol (MIRALAX   / GLYCOLAX ) 17 g packet Take 17 g by mouth daily.   tamsulosin  (FLOMAX ) 0.4 MG CAPS capsule Take 1 capsule (0.4 mg total) by mouth daily.   [DISCONTINUED] memantine  (NAMENDA ) 10 MG tablet Take 1 tablet by mouth 2 (two) times daily. (Patient not taking: Reported on 12/05/2023)   No facility-administered encounter medications on file as of 12/05/2023.    Review of Systems  Constitutional:  Negative for activity change, appetite change, fatigue and unexpected weight change.  HENT:  Negative for congestion and hearing loss.   Eyes: Negative.   Respiratory:  Negative for cough and shortness of breath.   Cardiovascular:  Negative for chest pain, palpitations and leg swelling.  Gastrointestinal:  Negative for abdominal pain, constipation and diarrhea.  Genitourinary:  Negative for difficulty urinating and dysuria.  Musculoskeletal:  Negative for arthralgias and myalgias.  Skin:  Negative for color change and wound.  Neurological:  Negative for dizziness and weakness.  Psychiatric/Behavioral:  Positive for confusion. Negative for agitation and behavioral problems.      Immunization History  Administered Date(s) Administered   Fluad Quad(high Dose 65+) 03/06/2019, 04/17/2020   Hep A / Hep B 03/17/2016   Hepatitis B, ADULT 03/17/2016   Influenza Split 04/03/2012, 03/12/2013   Influenza,inj,Quad PF,6+ Mos 02/24/2015   Influenza-Unspecified 03/29/2014, 03/08/2017, 03/09/2018, 04/06/2021, 04/06/2022   Moderna Covid-19 Fall Seasonal Vaccine 37yrs & older 09/27/2022   PPD Test 04/23/2021   Pneumococcal Conjugate-13 05/07/2014   Pneumococcal Polysaccharide-23 03/26/2013, 04/12/2019   Td,absorbed, Preservative Free, Adult Use, Lf Unspecified 07/29/2009   Tdap 07/29/2009, 05/31/2016   Unspecified SARS-COV-2 Vaccination 09/29/2023   Zoster, Live 04/26/2013   Pertinent  Health Maintenance Due  Topic Date Due   INFLUENZA VACCINE  01/19/2024      04/22/2021   10:00 PM 04/23/2021    7:45 AM  11/08/2022    8:45 AM 11/08/2022   12:24 PM 11/14/2023    9:03 AM  Fall Risk  Falls in the past year?   0 1 0  Was there an injury with Fall?    0   Fall Risk Category Calculator    2   (RETIRED) Patient Fall Risk Level High fall risk  High fall risk      Patient at Risk for Falls Due to    History of fall(s);Impaired balance/gait   Fall risk Follow up    Education provided;Falls prevention discussed      Data saved with a previous flowsheet row definition   Functional Status Survey:    Vitals:   12/05/23 0845  BP: 114/77  Pulse: 60  Resp: (!) 21  Temp: (!) 97.5 F (36.4 C)  SpO2: 90%  Weight: 237 lb 6.4 oz (107.7 kg)  Height: 5' 8 (1.727 m)   Body mass index is 36.1 kg/m.  Wt Readings from Last 3 Encounters:  12/05/23 237 lb 6.4 oz (107.7 kg)  11/14/23 237 lb (107.5 kg)  09/11/23 236 lb 9.6 oz (107.3 kg)    Physical Exam Constitutional:      General: He is not in acute distress.    Appearance: He is well-developed. He is not diaphoretic.  HENT:     Head: Normocephalic and atraumatic.     Right Ear: External ear normal.     Left Ear: External ear normal.     Mouth/Throat:     Pharynx: No oropharyngeal exudate.   Eyes:     Conjunctiva/sclera: Conjunctivae normal.     Pupils: Pupils are equal, round, and reactive to light.    Cardiovascular:     Rate and Rhythm: Normal rate and regular rhythm.     Heart sounds: Normal heart sounds.  Pulmonary:     Effort: Pulmonary effort is normal.     Breath sounds: Normal breath sounds.  Abdominal:     General: Bowel sounds are normal.     Palpations: Abdomen is soft.   Musculoskeletal:        General: No tenderness.     Cervical back: Normal range of motion and neck supple.     Right lower leg: No edema.     Left lower leg: No edema.   Skin:    General: Skin is warm and dry.   Neurological:     Mental Status: He is alert and oriented to person, place, and time. Mental status is at baseline.     Motor: Weakness  present.     Gait: Gait abnormal.     Labs reviewed: Recent Labs    01/12/23 0000 04/13/23 0000 06/19/23 0000 11/01/23 0000  NA 140 142 141 141  K 4.1 4.0 4.4 4.0  CL 100 100 101 100  CO2 33* 32* 33* 34*  BUN 16 16 18 20   CREATININE 0.8 0.9 0.9 0.9  CALCIUM  8.7 8.7  --  8.8   Recent Labs    04/13/23 0000 06/19/23 0000 11/01/23 0000  AST 14 13* 15  ALT 8* 9* 10  ALKPHOS 105 108 106  ALBUMIN 3.1* 3.3* 3.4*   Recent Labs    04/13/23 0000 06/19/23 0000 11/01/23 0000  WBC 7.7 9.2 7.9  NEUTROABS 4,320.00 5,207.00 4,527.00  HGB 14.4 15.1 14.9  HCT 44 45 46  PLT 233 249 235   Lab Results  Component Value Date   TSH 2.55 10/25/2022   Lab Results  Component Value Date   HGBA1C 5.7 03/18/2022   Lab Results  Component Value Date   CHOL 109 04/17/2020   HDL 38 (L) 04/17/2020   LDLCALC 47 04/17/2020   LDLDIRECT 109.0 04/12/2019   TRIG 160 (H) 04/17/2020   CHOLHDL 2.9 04/17/2020    Significant Diagnostic Results in last 30 days:  No results found.  Assessment/Plan Alzheimer's dementia without behavioral disturbance (HCC) Stable, no acute changes in cognitive or functional status, continue supportive care.   Aortic atherosclerosis (HCC) Continues on eliquis  BID  BPH with obstruction/lower urinary tract symptoms Urinary symptoms stable, no longer followed by urology Continues on flomax  daily   CHF (congestive heart failure), NYHA class I, chronic, diastolic (HCC) Euvolemic at this time, continues on lasix  and potassium   Chronic respiratory failure with hypoxia, on home oxygen  therapy (HCC) Stable, continues on O2 at 2L PRN  Constipation Controlled on colace   Hypertension Blood pressure well controlled, goal bp <140/90 Continue current medications and dietary modifications follow metabolic panel  Morbid obesity (HCC) Ongoing. Weight has been stable.   OSA (obstructive sleep apnea) Continues on CPAP  Persistent atrial fibrillation  (HCC) Rate controlled without medication. Continues on eliquis  for anticoagulation      Christopher Jenkins K. Denney Fisherman [facility] [phone]

## 2023-12-05 NOTE — Assessment & Plan Note (Signed)
 Rate controlled without medication. Continues on eliquis  for anticoagulation

## 2023-12-05 NOTE — Assessment & Plan Note (Signed)
 Continues on eliquis  BID

## 2023-12-05 NOTE — Assessment & Plan Note (Signed)
 Stable, no acute changes in cognitive or functional status, continue supportive care.

## 2023-12-05 NOTE — Assessment & Plan Note (Signed)
 Ongoing. Weight has been stable.

## 2023-12-05 NOTE — Assessment & Plan Note (Signed)
 Controlled on colace

## 2024-03-25 ENCOUNTER — Non-Acute Institutional Stay: Payer: Self-pay | Admitting: Adult Health

## 2024-03-25 ENCOUNTER — Encounter: Payer: Self-pay | Admitting: Adult Health

## 2024-03-25 DIAGNOSIS — L918 Other hypertrophic disorders of the skin: Secondary | ICD-10-CM

## 2024-03-25 DIAGNOSIS — G309 Alzheimer's disease, unspecified: Secondary | ICD-10-CM

## 2024-03-25 DIAGNOSIS — N401 Enlarged prostate with lower urinary tract symptoms: Secondary | ICD-10-CM | POA: Diagnosis not present

## 2024-03-25 DIAGNOSIS — I5032 Chronic diastolic (congestive) heart failure: Secondary | ICD-10-CM

## 2024-03-25 DIAGNOSIS — F0280 Dementia in other diseases classified elsewhere without behavioral disturbance: Secondary | ICD-10-CM

## 2024-03-25 DIAGNOSIS — I4819 Other persistent atrial fibrillation: Secondary | ICD-10-CM | POA: Diagnosis not present

## 2024-03-25 DIAGNOSIS — N138 Other obstructive and reflux uropathy: Secondary | ICD-10-CM

## 2024-03-25 NOTE — Progress Notes (Signed)
 Location:  Other [facility]) Nursing Home Room Number: [place] [address] [facility] (Memory Care)) Place of Service:  ALF [phone]) Provider:  Medina-Vargas, Endia Moncur, DNP, FNP-BC  Patient Care Team: Abdul Fine, MD as PCP - General (Family Medicine) [name], NP as Nurse Practitioner (Geriatric Medicine)  Extended Emergency Contact Information Primary Emergency Contact: Rosette Gunner Address: [address]          [place], [place] [address] [place] Home Phone: [phone] Relation: Spouse Secondary Emergency Contact: Gerry,Glenn Home Phone: [phone] Mobile Phone: [phone] Relation: Son  Code Status:  DNR  Goals of care: Advanced Directive information    03/25/2024    2:17 PM  Advanced Directives  Does Patient Have a Medical Advance Directive? Yes  Type of Estate agent of [place];Living will;Out of facility DNR (pink MOST or yellow form)  Does patient want to make changes to medical advance directive? No - Patient declined  Copy of Healthcare Power of Attorney in Chart? Yes - validated most recent copy scanned in chart (See row information)  Pre-existing out of facility DNR order (yellow form or pink MOST form) Yellow form placed in chart (order not valid for inpatient use)     Chief Complaint  Patient presents with   Skin Tag    skin tag/growth    Medical Management of Chronic Issues    HPI:  Pt is a 89 y.o. male seen today for medical management of chronic diseases. He is a resident of [facility] (memory care).  Staff noted resident to have skin tag on his left chin, dry. He denies pain to area. No redness.  BPH with obstruction/lower urinary tract symptoms -  takes Flomax , no reported urinary issues  CHF (congestive heart failure), NYHA class I, chronic, diastolic (HCC) -  no shortness of breath, takes Lasix   Persistent atrial fibrillation (HCC) -takes Eliquis   Alzheimer's  dementia without behavioral disturbance (HCC) -  currently at memory care unit, no reported behavior issues   Past Medical History:  Diagnosis Date   Acute respiratory failure with hypoxia (HCC)    Per [facility]   BPH (benign prostatic hyperplasia)    Duodenal mass    Elevated PSA    Fatty (change of) liver, not elsewhere classified    Per [facility]   Gross hematuria    Heart disease, unspecified    Per [facility]   Hyperlipidemia    Hypertension    Hypertrophy (benign) of prostate    asymptomatic on finasteride . will f/up with urologist Lannie Serve   Kidney lesion 07/22/2015   pt has appt at kidney specialist 08/05/15 to review CT scan.   Late onset Alzheimer disease (HCC)    Per [facility]   Obesity    Overweight    Peripheral vascular disease, unspecified    Per [facility]   Tubular adenoma of colon    Urinary retention    Past Surgical History:  Procedure Laterality Date   APPENDECTOMY     cataracts     PULMONARY THROMBECTOMY Bilateral 04/21/2021   Procedure: PULMONARY THROMBECTOMY;  Surgeon: Marea Selinda RAMAN, MD;  Location: [facility] INVASIVE CV LAB;  Service: Cardiovascular;  Laterality: Bilateral;   right knee surgery Right 12-17/2017   TOTAL HIP ARTHROPLASTY Right 08/13/2015   Procedure: TOTAL HIP ARTHROPLASTY;  Surgeon: Franky Cranker, MD;  Location: [facility] ORS;  Service: Orthopedics;  Laterality: Right;   Uvula Surgery  1990    Allergies  Allergen Reactions  No Known Allergies     Outpatient Encounter Medications as of 03/25/2024  Medication Sig   acetaminophen  (TYLENOL ) 500 MG tablet Take 1,000 mg by mouth every 8 (eight) hours as needed.   apixaban  (ELIQUIS ) 5 MG TABS tablet TAKE ONE TABLET BY MOUTH TWICE DAILY   bisacodyl  (DULCOLAX) 10 MG suppository Place 10 mg rectally daily as needed for moderate constipation.   Calcium  Carb-Cholecalciferol  (CALCIUM  600+D3) 600-20 MG-MCG TABS Take 1 tablet by mouth at bedtime.   docusate sodium  (COLACE) 100 MG capsule Take  2 capsules (200 mg total) by mouth at bedtime.   furosemide  (LASIX ) 40 MG tablet Take 40 mg by mouth daily.   KETOCONAZOLE, TOPICAL, (NIZORAL) 1 % SHAM Apply topically. Apply to hair and scalp topically every day shift every Wed, Sat for treatment twice weekly with bath   KLOR-CON  20 MEQ packet Take 20 mEq by mouth daily.   Multiple Vitamins-Minerals (MULTIVITAMIN WITH MINERALS) tablet Take 1 tablet by mouth daily. At 1400   OXYGEN  2 lpm as needed to keep sat>90%   polyethylene glycol (MIRALAX  / GLYCOLAX ) 17 g packet Take 17 g by mouth daily.   tamsulosin  (FLOMAX ) 0.4 MG CAPS capsule Take 1 capsule (0.4 mg total) by mouth daily.   No facility-administered encounter medications on file as of 03/25/2024.    Review of Systems  Unable to obtain due to alzheimer's disease.   Immunization History  Administered Date(s) Administered   Fluad Quad(high Dose 65+) 03/06/2019, 04/17/2020   Hep A / Hep B 03/17/2016   Hepatitis B, ADULT 03/17/2016   Influenza Split 04/03/2012, 03/12/2013   Influenza,inj,Quad PF,6+ Mos 02/24/2015   Influenza-Unspecified 03/29/2014, 03/08/2017, 03/09/2018, 04/06/2021, 04/06/2022   Moderna Covid-19 Fall Seasonal Vaccine 8yrs & older 09/27/2022   PPD Test 04/23/2021   Pneumococcal Conjugate-13 05/07/2014   Pneumococcal Polysaccharide-23 03/26/2013, 04/12/2019   Td,absorbed, Preservative Free, Adult Use, Lf Unspecified 07/29/2009   Tdap 07/29/2009, 05/31/2016   Unspecified SARS-COV-2 Vaccination 09/29/2023   Zoster, Live 04/26/2013   Pertinent  Health Maintenance Due  Topic Date Due   Influenza Vaccine  01/19/2024      04/22/2021   10:00 PM 04/23/2021    7:45 AM 11/08/2022    8:45 AM 11/08/2022   12:24 PM 11/14/2023    9:03 AM  Fall Risk  Falls in the past year?   0 1 0  Was there an injury with Fall?    0   Fall Risk Category Calculator    2   (RETIRED) Patient Fall Risk Level High fall risk  High fall risk      Patient at Risk for Falls Due to    History of  fall(s);Impaired balance/gait   Fall risk Follow up    Education provided;Falls prevention discussed      Data saved with a previous flowsheet row definition     Vitals:   03/25/24 1410  BP: 138/76  Pulse: 73  Resp: 17  Temp: 98.8 F (37.1 C)  SpO2: 91%  Weight: 235 lb 3.2 oz (106.7 kg)  Height: 5' 8 (1.727 m)   Body mass index is 35.76 kg/m.  Physical Exam Constitutional:      General: He is not in acute distress.    Appearance: He is obese.  HENT:     Head: Normocephalic and atraumatic.     Mouth/Throat:     Mouth: Mucous membranes are moist.  Eyes:     Conjunctiva/sclera: Conjunctivae normal.  Cardiovascular:     Rate and Rhythm:  Normal rate and regular rhythm.     Pulses: Normal pulses.     Heart sounds: Normal heart sounds.  Pulmonary:     Effort: Pulmonary effort is normal.     Breath sounds: Normal breath sounds.  Abdominal:     General: Bowel sounds are normal.     Palpations: Abdomen is soft.  Musculoskeletal:        General: No swelling. Normal range of motion.     Cervical back: Normal range of motion.  Skin:    General: Skin is warm and dry.     Comments: Skin tag to left chin.  Psychiatric:        Mood and Affect: Mood normal.        Behavior: Behavior normal.      Labs reviewed: Recent Labs    04/13/23 0000 06/19/23 0000 11/01/23 0000  NA 142 141 141  K 4.0 4.4 4.0  CL 100 101 100  CO2 32* 33* 34*  BUN 16 18 20   CREATININE 0.9 0.9 0.9  CALCIUM  8.7  --  8.8   Recent Labs    04/13/23 0000 06/19/23 0000 11/01/23 0000  AST 14 13* 15  ALT 8* 9* 10  ALKPHOS 105 108 106  ALBUMIN 3.1* 3.3* 3.4*   Recent Labs    04/13/23 0000 06/19/23 0000 11/01/23 0000  WBC 7.7 9.2 7.9  NEUTROABS 4,320.00 5,207.00 4,527.00  HGB 14.4 15.1 14.9  HCT 44 45 46  PLT 233 249 235   Lab Results  Component Value Date   TSH 2.55 10/25/2022   Lab Results  Component Value Date   HGBA1C 5.7 03/18/2022   Lab Results  Component Value Date    CHOL 109 04/17/2020   HDL 38 (L) 04/17/2020   LDLCALC 47 04/17/2020   LDLDIRECT 109.0 04/12/2019   TRIG 160 (H) 04/17/2020   CHOLHDL 2.9 04/17/2020    Significant Diagnostic Results in last 30 days:  No results found.  Assessment/Plan  1. BPH with obstruction/lower urinary tract symptoms (Primary) -  no urinary complaints -Continue Flomax  0.4 mg daily  2. CHF (congestive heart failure), NYHA class I, chronic, diastolic (HCC) -  No shortness of breath   -    Continue Lasix  40 mg daily -   Continue Klor-Con  20 mEq daily  3. Persistent atrial fibrillation (HCC) -  Rate controlled   -    Continue Eliquis  5 mg twice a day  4. Alzheimer's dementia without behavioral disturbance (HCC) -   Continue supportive care -  Fall precautions  5. Skin tag -   No redness, denies pain on left chin -  Dermatology consult     Family/ staff Communication: Discussed plan of care with  charge nurse.  Labs/tests ordered:  None    Lura Falor Medina-Vargas, DNP, MSN, FNP-BC [facility] [phone] (Monday-Friday 8:00 a.m. - 5:00 p.m.) [phone] (after hours)

## 2024-04-17 ENCOUNTER — Emergency Department

## 2024-04-17 ENCOUNTER — Encounter: Payer: Self-pay | Admitting: Medical Oncology

## 2024-04-17 ENCOUNTER — Emergency Department
Admission: EM | Admit: 2024-04-17 | Discharge: 2024-04-17 | Disposition: A | Discharge status: Home / Self Care | Attending: Emergency Medicine | Admitting: Emergency Medicine

## 2024-04-17 ENCOUNTER — Other Ambulatory Visit: Payer: Self-pay

## 2024-04-17 DIAGNOSIS — W01198A Fall on same level from slipping, tripping and stumbling with subsequent striking against other object, initial encounter: Secondary | ICD-10-CM | POA: Diagnosis not present

## 2024-04-17 DIAGNOSIS — I4891 Unspecified atrial fibrillation: Secondary | ICD-10-CM | POA: Insufficient documentation

## 2024-04-17 DIAGNOSIS — S0101XA Laceration without foreign body of scalp, initial encounter: Secondary | ICD-10-CM | POA: Diagnosis not present

## 2024-04-17 DIAGNOSIS — Z7901 Long term (current) use of anticoagulants: Secondary | ICD-10-CM | POA: Diagnosis not present

## 2024-04-17 DIAGNOSIS — F0390 Unspecified dementia without behavioral disturbance: Secondary | ICD-10-CM | POA: Insufficient documentation

## 2024-04-17 DIAGNOSIS — S0990XA Unspecified injury of head, initial encounter: Secondary | ICD-10-CM

## 2024-04-17 DIAGNOSIS — Z23 Encounter for immunization: Secondary | ICD-10-CM | POA: Insufficient documentation

## 2024-04-17 MED ORDER — TETANUS-DIPHTH-ACELL PERTUSSIS 5-2-15.5 LF-MCG/0.5 IM SUSP
0.5000 mL | Freq: Once | INTRAMUSCULAR | Status: AC
  Administered 2024-04-17: 0.5 mL via INTRAMUSCULAR
  Filled 2024-04-17: qty 0.5

## 2024-04-17 NOTE — Discharge Instructions (Addendum)
 Seen in the emergency department following a fall with head injury.  CT scan of the head and neck did not show any traumatic injury.  Scalp laceration was repaired with 2 staples that will need to be removed in 7 to 10 days.  Return for any worsening or ongoing symptoms.  Tetanus was updated while in the emergency department.  Can take Tylenol  as needed for pain control.

## 2024-04-17 NOTE — ED Provider Notes (Signed)
-----------------------------------------   3:26 PM on 04/17/2024 -----------------------------------------  Blood pressure (!) 152/70, pulse 68, temperature 98.2 F (36.8 C), temperature source Oral, resp. rate 20, height 5' 8 (1.727 m), weight 106 kg, SpO2 100%.  Assuming care from Dr. Suzanne.  In short, Christopher Jenkins is a 89 y.o. male with a chief complaint of Fall and Laceration .  Refer to the original H&P for additional details.  The current plan of care is to follow-up CT head and C-spine results following mechanical fall.  ----------------------------------------- 4:50 PM on 04/17/2024 ----------------------------------------- CT head and cervical spine are negative for acute traumatic injury, patient feeling well on reassessment and is appropriate for discharge home.  He was counseled to return to the ED for new or worsening symptoms, patient agrees with plan.       Willo Dunnings, MD 04/17/24 [phone]

## 2024-04-17 NOTE — ED Triage Notes (Signed)
 Pt from [facility] via ems with reports that pt was witnessed walking and then tripping and falling. Pt hit back of head has lac, on eliquis . Per staff at facility pt remained at baseline mental status. Pt denies pain.

## 2024-04-17 NOTE — ED Notes (Signed)
 Patient assisted to use urinal at this time. Patient's brief was also found to be soiled with urine. Patient was placed in a clean brief with no other needs at this time. Bed in lowest position, call light in reach, bed alarm activated.

## 2024-04-17 NOTE — ED Notes (Signed)
 This RN assisted patient to use the urinal. Patient urinated and was cleaned up. Patient denies other needs at this time.

## 2024-04-17 NOTE — ED Notes (Signed)
 [facility] here to transport pt back to facility. Pt cleaned of stool and urine prior to their arrival. Pt placed in new brief.

## 2024-04-17 NOTE — ED Notes (Signed)
 Called [facility] speak with ED 5:12pm for PT to be taken back to [facility]

## 2024-04-17 NOTE — ED Notes (Signed)
 Patient assisted to use restroom by this RN and Massie, EDT. Patient ambulated well with 2 person assist. Patient had a large bowel movement, was cleaned up, and placed in a clean brief. Patient was returned to bed without complication. Bed in lowest position with call light in reach. Fall risk precautions in place.

## 2024-04-17 NOTE — ED Provider Notes (Signed)
 [facility] Provider Note    Event Date/Time   First MD Initiated Contact with Patient 04/17/24 1341     (approximate)   History   Fall and Laceration   HPI  Christopher Jenkins is a 89 y.o. male past medical history significant for dementia, A-fib on Eliquis  who presents to the emergency department following a fall.  Patient had a witnessed fall at his facility where he tripped walking back from the dining hall.  Had no loss of consciousness but did hit his head with a small laceration.  Patient without any complaints.  Denies any neck pain, shortness of breath, nausea or vomiting.     Physical Exam   Triage Vital Signs: ED Triage Vitals  Encounter Vitals Group     BP 04/17/24 1339 (!) 152/70     Girls Systolic BP Percentile --      Girls Diastolic BP Percentile --      Boys Systolic BP Percentile --      Boys Diastolic BP Percentile --      Pulse Rate 04/17/24 1339 68     Resp 04/17/24 1339 20     Temp 04/17/24 1339 98.2 F (36.8 C)     Temp Source 04/17/24 1339 Oral     SpO2 04/17/24 1339 100 %     Weight 04/17/24 1338 233 lb 11 oz (106 kg)     Height 04/17/24 1338 5' 8 (1.727 m)     Head Circumference --      Peak Flow --      Pain Score 04/17/24 1337 0     Pain Loc --      Pain Education --      Exclude from Growth Chart --     Most recent vital signs: Vitals:   04/17/24 1339 04/17/24 1534  BP: (!) 152/70 (!) 130/58  Pulse: 68 62  Resp: 20 (!) 23  Temp: 98.2 F (36.8 C)   SpO2: 100% 91%    Physical Exam Constitutional:      Appearance: He is well-developed.  HENT:     Head:     Comments: 2 cm laceration to the occipital scalp, hemostatic Eyes:     Conjunctiva/sclera: Conjunctivae normal.  Cardiovascular:     Rate and Rhythm: Rhythm irregular.  Pulmonary:     Effort: No respiratory distress.  Abdominal:     Tenderness: There is no abdominal tenderness.  Musculoskeletal:        General: Normal range of motion.      Cervical back: Normal range of motion. No tenderness.     Comments: No tenderness to palpation to bilateral upper extremities or lower extremities.  No tenderness to pelvis.  No midline cervical, thoracic or lumbar tenderness to palpation  Skin:    General: Skin is warm.  Neurological:     Mental Status: He is alert. Mental status is at baseline.     IMPRESSION / MDM / ASSESSMENT AND PLAN / ED COURSE  I reviewed the triage vital signs and the nursing notes.  Differential diagnosis including intracranial hemorrhage, concussion, scalp laceration, cervical spine fracture  EKG  I, Clotilda Punter, the attending physician, personally viewed and interpreted this ECG. EKG with frequent PVCs.  Otherwise has normal sinus rhythm.  Ventricular trigeminy.  No significant change when compared to November 2022  Frequent PVCs while on cardiac telemetry.  RADIOLOGY I independently reviewed imaging, my interpretation of imaging: No obvious intracranial hemorrhage  CT scan of  the head and neck read pending  LABS (all labs ordered are listed, but only abnormal results are displayed) Labs interpreted as -    Labs Reviewed - No data to display   MDM  Patient had a witnessed fall, no syncopal fall presented to the emergency department with head injury on anticoagulation.  At mental status baseline.  Superficial laceration that was hemostatic.  Tetanus updated. Laceration repaired discussed need for staple removal.  If CT scans are negative plan to discharge back to facility     PROCEDURES:  Critical Care performed: No  .Laceration Repair  Date/Time: 04/17/2024 3:52 PM  Performed by: Suzanne Kirsch, MD Authorized by: Suzanne Kirsch, MD   Consent:    Consent obtained:  Verbal   Consent given by:  Patient   Risks, benefits, and alternatives were discussed: yes     Risks discussed:  Pain, nerve damage, poor wound healing, poor cosmetic result, vascular damage, tendon damage, infection and  need for additional repair   Alternatives discussed:  Delayed treatment and no treatment Universal protocol:    Procedure explained and questions answered to patient or proxy's satisfaction: yes     Relevant documents present and verified: yes     Test results available: yes     Imaging studies available: yes     Required blood products, implants, devices, and special equipment available: yes     Patient identity confirmed:  Verbally with patient Anesthesia:    Anesthesia method:  None Laceration details:    Location:  Scalp   Length (cm):  1 Pre-procedure details:    Preparation:  Patient was prepped and draped in usual sterile fashion Skin repair:    Repair method:  Staples   Number of staples:  2 Approximation:    Approximation:  Close Repair type:    Repair type:  Simple Post-procedure details:    Dressing:  Sterile dressing   Procedure completion:  Tolerated well, no immediate complications   Patient's presentation is most consistent with acute presentation with potential threat to life or bodily function.   MEDICATIONS ORDERED IN ED: Medications  Tdap (ADACEL) injection 0.5 mL (has no administration in time range)    FINAL CLINICAL IMPRESSION(S) / ED DIAGNOSES   Final diagnoses:  Injury of head, initial encounter  Laceration of scalp, initial encounter     Rx / DC Orders   ED Discharge Orders     None        Note:  This document was prepared using Dragon voice recognition software and may include unintentional dictation errors.   Suzanne Kirsch, MD 04/17/24 1553

## 2024-04-17 NOTE — ED Notes (Signed)
 Assisted patient with the use of a urinal. Patient is resting comfortably in bed, no other needs stated at this time. Call light is within patients reach.

## 2024-04-17 NOTE — ED Notes (Signed)
 Patient assisted with the use of a urinal at this time, no other needs.

## 2024-05-02 LAB — HEPATIC FUNCTION PANEL
ALT: 8 U/L — AB (ref 10–40)
AST: 16 (ref 14–40)
Alkaline Phosphatase: 105 (ref 25–125)
Bilirubin, Total: 0.6

## 2024-05-02 LAB — CBC AND DIFFERENTIAL
HCT: 47 (ref 41–53)
Hemoglobin: 15.4 (ref 13.5–17.5)
Neutrophils Absolute: 5472
Platelets: 219 K/uL (ref 150–400)
WBC: 9

## 2024-05-02 LAB — BASIC METABOLIC PANEL WITH GFR
BUN: 16 (ref 4–21)
CO2: 34 — AB (ref 13–22)
Chloride: 99 (ref 99–108)
Creatinine: 1 (ref 0.6–1.3)
Glucose: 110
Potassium: 4.6 meq/L (ref 3.5–5.1)
Sodium: 141 (ref 137–147)

## 2024-05-02 LAB — CBC: RBC: 4.9 (ref 3.87–5.11)

## 2024-05-02 LAB — COMPREHENSIVE METABOLIC PANEL WITH GFR
Albumin: 3.4 — AB (ref 3.5–5.0)
Calcium: 9 (ref 8.7–10.7)
Globulin: 2.4
eGFR: 70

## 2024-05-28 ENCOUNTER — Non-Acute Institutional Stay: Payer: Self-pay | Admitting: Nurse Practitioner

## 2024-05-28 ENCOUNTER — Encounter: Payer: Self-pay | Admitting: Nurse Practitioner

## 2024-05-28 DIAGNOSIS — K5904 Chronic idiopathic constipation: Secondary | ICD-10-CM

## 2024-05-28 DIAGNOSIS — I4819 Other persistent atrial fibrillation: Secondary | ICD-10-CM | POA: Diagnosis not present

## 2024-05-28 DIAGNOSIS — N138 Other obstructive and reflux uropathy: Secondary | ICD-10-CM

## 2024-05-28 DIAGNOSIS — G309 Alzheimer's disease, unspecified: Secondary | ICD-10-CM | POA: Diagnosis not present

## 2024-05-28 DIAGNOSIS — R5381 Other malaise: Secondary | ICD-10-CM | POA: Diagnosis not present

## 2024-05-28 DIAGNOSIS — G4733 Obstructive sleep apnea (adult) (pediatric): Secondary | ICD-10-CM | POA: Diagnosis not present

## 2024-05-28 DIAGNOSIS — F0280 Dementia in other diseases classified elsewhere without behavioral disturbance: Secondary | ICD-10-CM

## 2024-05-28 DIAGNOSIS — I5032 Chronic diastolic (congestive) heart failure: Secondary | ICD-10-CM

## 2024-05-28 DIAGNOSIS — N401 Enlarged prostate with lower urinary tract symptoms: Secondary | ICD-10-CM | POA: Diagnosis not present

## 2024-05-28 DIAGNOSIS — I10 Essential (primary) hypertension: Secondary | ICD-10-CM

## 2024-05-28 NOTE — Progress Notes (Unsigned)
 Location:  Other [facility].  Nursing Home Room Number: [facility] Place of Service:  ALF [phone]) Harlene An, NP  PCP: [name], DO  Patient Care Team: [name], DO as PCP - General (Internal Medicine) An Harlene POUR, NP as Nurse Practitioner (Geriatric Medicine)  Extended Emergency Contact Information Primary Emergency Contact: [facility] Address: [address]          [place], [place] [address] [place] Home Phone: [phone] Relation: Spouse Secondary Emergency Contact: Bruins,Glenn Home Phone: [phone] Mobile Phone: [phone] Relation: Son  Goals of care: Advanced Directive information    05/28/2024    1:19 PM  Advanced Directives  Does Patient Have a Medical Advance Directive? Yes  Type of Advance Directive Out of facility DNR (pink MOST or yellow form)  Does patient want to make changes to medical advance directive? No - Patient declined     Chief Complaint  Patient presents with   Medical Management of Chronic Issues    Medical Management of Chronic Issues.     HPI:  Pt is a 89 y.o. male seen today for medical management of chronic disease. Pt with hx of dementia, OSA, CHF, BPH, obesity, a fib and others.  He reports he is doing well.  Denies pain Reports he sleeps well at night.  No shortness of breath or chest pains.  Eating well.   Pt had an unwitnessed fall on 05/25/24. No injury or pain noted.  He is on eliquis  for a fib but did not hit head and no excessive bruising noted.  Staff requesting PT eval due to fall.   Past Medical History:  Diagnosis Date   Acute respiratory failure with hypoxia (HCC)    Per [facility]   BPH (benign prostatic hyperplasia)    Duodenal mass    Elevated PSA    Fatty (change of) liver, not elsewhere classified    Per [facility]   Gross hematuria    Heart disease, unspecified    Per [facility]   Hyperlipidemia    Hypertension    Hypertrophy (benign) of prostate     asymptomatic on finasteride . will f/up with urologist Lannie Serve   Kidney lesion 07/22/2015   pt has appt at kidney specialist 08/05/15 to review CT scan.   Late onset Alzheimer disease (HCC)    Per [facility]   Obesity    Overweight    Peripheral vascular disease, unspecified    Per [facility]   Tubular adenoma of colon    Urinary retention    Past Surgical History:  Procedure Laterality Date   APPENDECTOMY     cataracts     PULMONARY THROMBECTOMY Bilateral 04/21/2021   Procedure: PULMONARY THROMBECTOMY;  Surgeon: Marea Selinda RAMAN, MD;  Location: [facility] INVASIVE CV LAB;  Service: Cardiovascular;  Laterality: Bilateral;   right knee surgery Right 12-17/2017   TOTAL HIP ARTHROPLASTY Right 08/13/2015   Procedure: TOTAL HIP ARTHROPLASTY;  Surgeon: Franky Cranker, MD;  Location: [facility] ORS;  Service: Orthopedics;  Laterality: Right;   Uvula Surgery  1990    Allergies  Allergen Reactions   No Known Allergies     Outpatient Encounter Medications as of 05/28/2024  Medication Sig   acetaminophen  (TYLENOL ) 500 MG tablet Take 1,000 mg by mouth every 8 (eight) hours as needed.   apixaban  (ELIQUIS ) 5 MG TABS tablet TAKE ONE TABLET BY MOUTH TWICE DAILY   bisacodyl  (DULCOLAX) 10 MG suppository Place 10 mg rectally daily as needed for moderate  constipation.   Calcium  Carb-Cholecalciferol  (CALCIUM  600+D3) 600-20 MG-MCG TABS Take 1 tablet by mouth at bedtime.   docusate sodium  (COLACE) 100 MG capsule Take 2 capsules (200 mg total) by mouth at bedtime.   furosemide  (LASIX ) 40 MG tablet Take 40 mg by mouth daily.   KETOCONAZOLE, TOPICAL, (NIZORAL) 1 % SHAM Apply topically. Apply to hair and scalp topically every day shift every Wed, Sat for treatment twice weekly with bath   KLOR-CON  20 MEQ packet Take 20 mEq by mouth daily.   Multiple Vitamins-Minerals (MULTIVITAMIN WITH MINERALS) tablet Take 1 tablet by mouth daily. At 1400   OXYGEN  2 lpm as needed to keep sat>90%   polyethylene glycol (MIRALAX  /  GLYCOLAX ) 17 g packet Take 17 g by mouth every other day.   tamsulosin  (FLOMAX ) 0.4 MG CAPS capsule Take 1 capsule (0.4 mg total) by mouth daily.   No facility-administered encounter medications on file as of 05/28/2024.    Review of Systems  Constitutional:  Negative for chills and fever.  HENT:  Negative for tinnitus.   Respiratory:  Negative for cough and shortness of breath.   Cardiovascular:  Negative for chest pain, palpitations and leg swelling.  Gastrointestinal:  Negative for abdominal pain, constipation and diarrhea.  Genitourinary:  Negative for dysuria, frequency and urgency.  Musculoskeletal:  Negative for back pain and myalgias.  Skin: Negative.   Neurological:  Negative for dizziness and headaches.     Immunization History  Administered Date(s) Administered   Fluad Quad(high Dose 65+) 03/06/2019, 04/17/2020   Hep A / Hep B 03/17/2016   Hepatitis B, ADULT 03/17/2016   Influenza Split 04/03/2012, 03/12/2013   Influenza,inj,Quad PF,6+ Mos 02/24/2015   Influenza-Unspecified 03/29/2014, 03/08/2017, 03/09/2018, 04/06/2021, 04/06/2022, 04/02/2024   Moderna Covid-19 Fall Seasonal Vaccine 73yrs & older 09/27/2022   PPD Test 04/23/2021   Pneumococcal Conjugate-13 05/07/2014   Pneumococcal Polysaccharide-23 03/26/2013, 04/12/2019   Td,absorbed, Preservative Free, Adult Use, Lf Unspecified 07/29/2009   Tdap 07/29/2009, 05/31/2016, 04/17/2024   Unspecified SARS-COV-2 Vaccination 09/29/2023   Zoster, Live 04/26/2013   Pertinent  Health Maintenance Due  Topic Date Due   Influenza Vaccine  Completed      04/22/2021   10:00 PM 04/23/2021    7:45 AM 11/08/2022    8:45 AM 11/08/2022   12:24 PM 11/14/2023    9:03 AM  Fall Risk  Falls in the past year?   0 1 0  Was there an injury with Fall?    0    Fall Risk Category Calculator    2   (RETIRED) Patient Fall Risk Level High fall risk  High fall risk      Patient at Risk for Falls Due to    History of fall(s);Impaired  balance/gait   Fall risk Follow up    Education provided;Falls prevention discussed      Data saved with a previous flowsheet row definition   Functional Status Survey:    Vitals:   05/28/24 1252  BP: (!) 126/59  Pulse: 69  Resp: 20  Temp: 98.7 F (37.1 C)  SpO2: 93%  Weight: 230 lb 12.8 oz (104.7 kg)  Height: 5' 8 (1.727 m)   Body mass index is 35.09 kg/m. Physical Exam Constitutional:      General: He is not in acute distress.    Appearance: He is well-developed. He is not diaphoretic.  HENT:     Head: Normocephalic and atraumatic.     Right Ear: External ear normal.  Left Ear: External ear normal.     Mouth/Throat:     Pharynx: No oropharyngeal exudate.  Eyes:     Conjunctiva/sclera: Conjunctivae normal.     Pupils: Pupils are equal, round, and reactive to light.  Cardiovascular:     Rate and Rhythm: Normal rate and regular rhythm.     Heart sounds: Normal heart sounds.  Pulmonary:     Effort: Pulmonary effort is normal.     Breath sounds: Normal breath sounds.  Abdominal:     General: Bowel sounds are normal.     Palpations: Abdomen is soft.  Musculoskeletal:        General: No tenderness.     Cervical back: Normal range of motion and neck supple.     Right lower leg: No edema.     Left lower leg: No edema.  Skin:    General: Skin is warm and dry.  Neurological:     Mental Status: He is alert and oriented to person, place, and time.     Labs reviewed: Recent Labs    06/19/23 0000 11/01/23 0000 05/02/24 0000  NA 141 141 141  K 4.4 4.0 4.6  CL 101 100 99  CO2 33* 34* 34*  BUN 18 20 16   CREATININE 0.9 0.9 1.0  CALCIUM   --  8.8 9.0   Recent Labs    06/19/23 0000 11/01/23 0000 05/02/24 0000  AST 13* 15 16  ALT 9* 10 8*  ALKPHOS 108 106 105  ALBUMIN 3.3* 3.4* 3.4*   Recent Labs    06/19/23 0000 11/01/23 0000 05/02/24 0000  WBC 9.2 7.9 9.0  NEUTROABS 5,207.00 4,527.00 5,472.00  HGB 15.1 14.9 15.4  HCT 45 46 47  PLT 249 235 219    Lab Results  Component Value Date   TSH 2.55 10/25/2022   Lab Results  Component Value Date   HGBA1C 5.7 03/18/2022   Lab Results  Component Value Date   CHOL 109 04/17/2020   HDL 38 (L) 04/17/2020   LDLCALC 47 04/17/2020   LDLDIRECT 109.0 04/12/2019   TRIG 160 (H) 04/17/2020   CHOLHDL 2.9 04/17/2020    Significant Diagnostic Results in last 30 days:  No results found.  Assessment/Plan Persistent atrial fibrillation (HCC) Rate controlled without medication. Continues on eliquis  for anticoagulation.  No bleeding or bruising reported  OSA (obstructive sleep apnea) Continues on CPAP, using supplemental O2 at bedtime as well.   Hypertension Blood pressure well controlled not currently on medication  follow metabolic panel  Constipation Controlled on colace.   CHF (congestive heart failure), NYHA class I, chronic, diastolic (HCC) Euvolemic at this time, continues on lasix  and potassium.   BPH with obstruction/lower urinary tract symptoms Urinary symptoms stable, no longer followed by urology Continues on flomax  daily.   Alzheimer's dementia without behavioral disturbance (HCC) Stable, no acute changes in cognitive or functional status, continue supportive care.   Debility and fall PT order given   Kamryn Messineo K. Caro BODILY [facility] [phone]

## 2024-06-04 NOTE — Assessment & Plan Note (Signed)
 Urinary symptoms stable, no longer followed by urology Continues on flomax  daily

## 2024-06-04 NOTE — Assessment & Plan Note (Signed)
 Euvolemic at this time, continues on lasix  and potassium

## 2024-06-04 NOTE — Assessment & Plan Note (Signed)
 Blood pressure well controlled not currently on medication  follow metabolic panel

## 2024-06-04 NOTE — Assessment & Plan Note (Signed)
 Continues on CPAP, using supplemental O2 at bedtime as well.

## 2024-06-04 NOTE — Assessment & Plan Note (Signed)
 Stable, no acute changes in cognitive or functional status, continue supportive care.

## 2024-06-04 NOTE — Assessment & Plan Note (Signed)
 Rate controlled without medication. Continues on eliquis  for anticoagulation.  No bleeding or bruising reported

## 2024-06-04 NOTE — Assessment & Plan Note (Signed)
 Controlled on colace

## 2024-07-05 ENCOUNTER — Encounter: Payer: Self-pay | Admitting: Internal Medicine

## 2024-07-05 ENCOUNTER — Non-Acute Institutional Stay: Payer: Self-pay | Admitting: Internal Medicine

## 2024-07-05 DIAGNOSIS — Z66 Do not resuscitate: Secondary | ICD-10-CM

## 2024-07-05 DIAGNOSIS — E66811 Obesity, class 1: Secondary | ICD-10-CM

## 2024-07-05 DIAGNOSIS — N401 Enlarged prostate with lower urinary tract symptoms: Secondary | ICD-10-CM

## 2024-07-05 DIAGNOSIS — G301 Alzheimer's disease with late onset: Secondary | ICD-10-CM | POA: Diagnosis not present

## 2024-07-05 DIAGNOSIS — N138 Other obstructive and reflux uropathy: Secondary | ICD-10-CM | POA: Diagnosis not present

## 2024-07-05 DIAGNOSIS — I10 Essential (primary) hypertension: Secondary | ICD-10-CM | POA: Diagnosis not present

## 2024-07-05 DIAGNOSIS — J9611 Chronic respiratory failure with hypoxia: Secondary | ICD-10-CM | POA: Diagnosis not present

## 2024-07-05 DIAGNOSIS — E782 Mixed hyperlipidemia: Secondary | ICD-10-CM | POA: Diagnosis not present

## 2024-07-05 DIAGNOSIS — I4819 Other persistent atrial fibrillation: Secondary | ICD-10-CM | POA: Diagnosis not present

## 2024-07-05 DIAGNOSIS — I5032 Chronic diastolic (congestive) heart failure: Secondary | ICD-10-CM

## 2024-07-05 DIAGNOSIS — K5904 Chronic idiopathic constipation: Secondary | ICD-10-CM

## 2024-07-05 DIAGNOSIS — F02B0 Dementia in other diseases classified elsewhere, moderate, without behavioral disturbance, psychotic disturbance, mood disturbance, and anxiety: Secondary | ICD-10-CM

## 2024-07-05 NOTE — Assessment & Plan Note (Signed)
 Stable.  He remains on Eliquis  5 mg twice daily.  He is not on any rate controlling medications.

## 2024-07-05 NOTE — Progress Notes (Signed)
 [name]    Location:  Other [facility].  Nursing Home Room Number: [facility] Place of Service:  ALF [phone])   [name], DO   Patient Care Team: [name], DO as PCP - General (Internal Medicine) [name], NP as Nurse Practitioner (Geriatric Medicine)   Extended Emergency Contact Information Primary Emergency Contact: [name] Address: [address]          [place], [place] [address] [place] Home Phone: [phone] Relation: Spouse Secondary Emergency Contact: Wadding,Glenn Home Phone: [phone] Mobile Phone: [phone] Relation: Son   Goals of care: Advanced Directive information    05/28/2024    1:19 PM  Advanced Directives  Does Patient Have a Medical Advance Directive? Yes  Type of Advance Directive Out of facility DNR (pink MOST or yellow form)  Does patient want to make changes to medical advance directive? No - Patient declined    CODE STATUS: Do Not Resuscitate (DNR)   Chief Complaint  Patient presents with   Medical Management of Chronic Issues     HPI: Pt is a 89 y.o. male seen today for medical management of chronic disease.   Christopher Jenkins is a 89 year old male who lives at [facility] memory care in [place].  He is a long-term resident.  He moved into memory care in September 2033 after living in assisted living at [facility] with his wife.  They moved in together.  He has a past medical history of moderate to severe Alzheimer's type dementia, morbid obesity, mixed hyperlipidemia, essential hypertension, chronic constipation, BPH with lower urinary tract symptoms, chronic diastolic heart failure, chronic atrial fibrillation on chronic anticoagulation with Eliquis .  Nursing without any concerns.   Past Medical History:  Diagnosis Date   Acute respiratory failure with hypoxia (HCC)    Per [facility]   BPH (benign prostatic hyperplasia)    Duodenal mass    Elevated PSA     Fatty (change of) liver, not elsewhere classified    Per [facility]   Gross hematuria    Heart disease, unspecified    Per [facility]   Hyperlipidemia    Hypertension    Hypertrophy (benign) of prostate    asymptomatic on finasteride . will f/up with urologist Lannie Serve   Kidney lesion 07/22/2015   pt has appt at kidney specialist 08/05/15 to review CT scan.   Late onset Alzheimer disease (HCC)    Per [facility]   Obesity    Overweight    Peripheral vascular disease, unspecified    Per [facility]   Tubular adenoma of colon    Urinary retention    Past Surgical History:  Procedure Laterality Date   APPENDECTOMY     cataracts     PULMONARY THROMBECTOMY Bilateral 04/21/2021   Procedure: PULMONARY THROMBECTOMY;  Surgeon: Marea Selinda RAMAN, MD;  Location: [facility] INVASIVE CV LAB;  Service: Cardiovascular;  Laterality: Bilateral;   right knee surgery Right 12-17/2017   TOTAL HIP ARTHROPLASTY Right 08/13/2015   Procedure: TOTAL HIP ARTHROPLASTY;  Surgeon: Franky Cranker, MD;  Location: [facility] ORS;  Service: Orthopedics;  Laterality: Right;   Uvula Surgery  1990     Allergies[1]   Outpatient Encounter Medications as of 07/05/2024  Medication Sig   acetaminophen  (TYLENOL ) 500 MG tablet Take 1,000 mg by mouth every 8 (eight) hours as needed.   apixaban  (ELIQUIS ) 5 MG TABS tablet TAKE ONE TABLET BY MOUTH TWICE DAILY   bisacodyl  (DULCOLAX)  10 MG suppository Place 10 mg rectally daily as needed for moderate constipation.   Calcium  Carb-Cholecalciferol  (CALCIUM  600+D3) 600-20 MG-MCG TABS Take 1 tablet by mouth at bedtime.   docusate sodium  (COLACE) 100 MG capsule Take 2 capsules (200 mg total) by mouth at bedtime.   furosemide  (LASIX ) 40 MG tablet Take 40 mg by mouth daily.   KETOCONAZOLE, TOPICAL, (NIZORAL) 1 % SHAM Apply topically. Apply to hair and scalp topically every day shift every Wed, Sat for treatment twice weekly with bath   KLOR-CON  20 MEQ packet Take 20 mEq by mouth daily.   Multiple  Vitamins-Minerals (MULTIVITAMIN WITH MINERALS) tablet Take 1 tablet by mouth daily. At 1400   OXYGEN  2 lpm as needed to keep sat>90%   polyethylene glycol (MIRALAX  / GLYCOLAX ) 17 g packet Take 17 g by mouth every other day.   tamsulosin  (FLOMAX ) 0.4 MG CAPS capsule Take 1 capsule (0.4 mg total) by mouth daily.   No facility-administered encounter medications on file as of 07/05/2024.     Review of Systems  Unable to perform ROS: Dementia      Immunization History  Administered Date(s) Administered   Fluad Quad(high Dose 65+) 03/06/2019, 04/17/2020   Hep A / Hep B 03/17/2016   Hepatitis B, ADULT 03/17/2016   Influenza Split 04/03/2012, 03/12/2013   Influenza,inj,Quad PF,6+ Mos 02/24/2015   Influenza-Unspecified 03/29/2014, 03/08/2017, 03/09/2018, 04/06/2021, 04/06/2022, 04/02/2024   Moderna Covid-19 Fall Seasonal Vaccine 72yrs & older 09/27/2022   PPD Test 04/23/2021   Pneumococcal Conjugate-13 05/07/2014   Pneumococcal Polysaccharide-23 03/26/2013, 04/12/2019   Td,absorbed, Preservative Free, Adult Use, Lf Unspecified 07/29/2009   Tdap 07/29/2009, 05/31/2016, 04/17/2024   Unspecified SARS-COV-2 Vaccination 09/29/2023   Zoster, Live 04/26/2013   Pertinent  Health Maintenance Due  Topic Date Due   Influenza Vaccine  Completed      04/22/2021   10:00 PM 04/23/2021    7:45 AM 11/08/2022    8:45 AM 11/08/2022   12:24 PM 11/14/2023    9:03 AM  Fall Risk  Falls in the past year?   0 1 0  Was there an injury with Fall?    0    Fall Risk Category Calculator    2   (RETIRED) Patient Fall Risk Level High fall risk  High fall risk      Patient at Risk for Falls Due to    History of fall(s);Impaired balance/gait   Fall risk Follow up    Education provided;Falls prevention discussed      Data saved with a previous flowsheet row definition   Functional Status Survey:     Vitals:   07/05/24 1357  BP: (!) 121/58  Pulse: (!) 58  Resp: 18  Temp: 98.8 F (37.1 C)  SpO2: 90%   Weight: 226 lb 9.6 oz (102.8 kg)  Height: 5' 8 (1.727 m)   Body mass index is 34.45 kg/m. Physical Exam Vitals and nursing note reviewed.  Constitutional:      General: He is not in acute distress.    Appearance: He is obese. He is not toxic-appearing.  HENT:     Head: Normocephalic and atraumatic.  Cardiovascular:     Rate and Rhythm: Normal rate and regular rhythm.  Pulmonary:     Effort: Pulmonary effort is normal. No respiratory distress.  Abdominal:     General: Abdomen is protuberant. Bowel sounds are normal.     Palpations: Abdomen is soft.  Skin:    General: Skin is warm and dry.  Capillary Refill: Capillary refill takes less than 2 seconds.  Neurological:     Comments: Sleeping.  Arousable.  Not oriented to place, time or situation.  Responds to his name.      Labs reviewed: Recent Labs    11/01/23 0000 05/02/24 0000  NA 141 141  K 4.0 4.6  CL 100 99  CO2 34* 34*  BUN 20 16  CREATININE 0.9 1.0  CALCIUM  8.8 9.0   Recent Labs    11/01/23 0000 05/02/24 0000  AST 15 16  ALT 10 8*  ALKPHOS 106 105  ALBUMIN 3.4* 3.4*   Recent Labs    11/01/23 0000 05/02/24 0000  WBC 7.9 9.0  NEUTROABS 4,527.00 5,472.00  HGB 14.9 15.4  HCT 46 47  PLT 235 219   Lab Results  Component Value Date   TSH 2.55 10/25/2022   Lab Results  Component Value Date   HGBA1C 5.7 03/18/2022   Lab Results  Component Value Date   CHOL 109 04/17/2020   HDL 38 (L) 04/17/2020   LDLCALC 47 04/17/2020   LDLDIRECT 109.0 04/12/2019   TRIG 160 (H) 04/17/2020   CHOLHDL 2.9 04/17/2020     Significant Diagnostic Results in last 30 days: No results found.   Assessment/Plan Essential hypertension BP controlled.  Continues on 40 mg of Lasix  a day.  Potassium chloride  20 mEq daily.  Persistent atrial fibrillation (HCC) Stable.  He remains on Eliquis  5 mg twice daily.  He is not on any rate controlling medications.  Moderate late onset Alzheimer dementia without behavioral  disturbance, psychotic disturbance, mood disturbance, or anxiety (HCC) Chronic.  He is not on any antipsychotics.  BPH with obstruction/lower urinary tract symptoms Continue Flomax  0.4 mg nightly  CHF (congestive heart failure), NYHA class I, chronic, diastolic (HCC) Stable.  Euvolemic.  Continue Lasix  40 mg daily.  Mixed hyperlipidemia Currently not on any statins or cholesterol medications.  Obesity, Class I, BMI 30-34.9 Body mass index is 34.45 kg/m.   Constipation Remains on Dulcolax suppositories as needed, Colace 200 mg nightly  Chronic respiratory failure with hypoxia, on home oxygen  therapy (HCC) Continue as needed 2 L of oxygen  as needed.  DNR (do not resuscitate)      There are no discontinued medications. Orders Placed This Encounter  Procedures   Do not attempt resuscitation (DNR)    If patient has no pulse and is not breathing:   Do Not Attempt Resuscitation    If patient has a pulse and/or is breathing: Medical Treatment Goals:   LIMITED ADDITIONAL INTERVENTIONS: Use medication/IV fluids and cardiac monitoring as indicated; Do not use intubation or mechanical ventilation (DNI), also provide comfort medications.  Transfer to Progressive/Stepdown as indicated, avoid Intensive Care.    Consent::   Discussion documented in EHR or advanced directives reviewed    Camellia Door, DO  [facility] & Adult Medicine [phone]      [1]  Allergies Allergen Reactions   No Known Allergies

## 2024-07-05 NOTE — Assessment & Plan Note (Signed)
 Christopher Jenkins

## 2024-07-05 NOTE — Assessment & Plan Note (Signed)
Body mass index is 34.45 kg/m.

## 2024-07-05 NOTE — Assessment & Plan Note (Signed)
 Continue as needed 2 L of oxygen  as needed.

## 2024-07-05 NOTE — Assessment & Plan Note (Signed)
 Remains on Dulcolax suppositories as needed, Colace 200 mg nightly

## 2024-07-05 NOTE — Assessment & Plan Note (Signed)
 Chronic.  He is not on any antipsychotics.

## 2024-07-05 NOTE — Assessment & Plan Note (Signed)
"  Continue Flomax  0.4 mg nightly   "

## 2024-07-05 NOTE — Assessment & Plan Note (Signed)
 Currently not on any statins or cholesterol medications.

## 2024-07-05 NOTE — Assessment & Plan Note (Signed)
 Stable.  Euvolemic.  Continue Lasix  40 mg daily.

## 2024-07-05 NOTE — Assessment & Plan Note (Signed)
 BP controlled.  Continues on 40 mg of Lasix  a day.  Potassium chloride  20 mEq daily.
